# Patient Record
Sex: Female | Born: 1960
Health system: Southern US, Community
[De-identification: ages and names within clinical notes are randomized; demographics above are authoritative.]

## PROBLEM LIST (undated history)

## (undated) DIAGNOSIS — T7840XA Allergy, unspecified, initial encounter: Secondary | ICD-10-CM

## (undated) DIAGNOSIS — R569 Unspecified convulsions: Secondary | ICD-10-CM

## (undated) DIAGNOSIS — E079 Disorder of thyroid, unspecified: Secondary | ICD-10-CM

## (undated) DIAGNOSIS — I10 Essential (primary) hypertension: Secondary | ICD-10-CM

## (undated) DIAGNOSIS — D649 Anemia, unspecified: Secondary | ICD-10-CM

## (undated) DIAGNOSIS — K219 Gastro-esophageal reflux disease without esophagitis: Secondary | ICD-10-CM

## (undated) DIAGNOSIS — E785 Hyperlipidemia, unspecified: Secondary | ICD-10-CM

## (undated) DIAGNOSIS — K625 Hemorrhage of anus and rectum: Secondary | ICD-10-CM

## (undated) DIAGNOSIS — D219 Benign neoplasm of connective and other soft tissue, unspecified: Secondary | ICD-10-CM

## (undated) DIAGNOSIS — Z9289 Personal history of other medical treatment: Secondary | ICD-10-CM

## (undated) DIAGNOSIS — Z8742 Personal history of other diseases of the female genital tract: Secondary | ICD-10-CM

## (undated) HISTORY — DX: Benign neoplasm of connective and other soft tissue, unspecified: D21.9

## (undated) HISTORY — DX: Allergy, unspecified, initial encounter: T78.40XA

## (undated) HISTORY — DX: Hyperlipidemia, unspecified: E78.5

## (undated) HISTORY — DX: Gastro-esophageal reflux disease without esophagitis: K21.9

## (undated) HISTORY — DX: Disorder of thyroid, unspecified: E07.9

## (undated) HISTORY — PX: MYOMECTOMY: SHX85

## (undated) HISTORY — PX: ANKLE SURGERY: SHX546

## (undated) HISTORY — DX: Hemorrhage of anus and rectum: K62.5

## (undated) HISTORY — DX: Personal history of other diseases of the female genital tract: Z87.42

## (undated) HISTORY — DX: Personal history of other medical treatment: Z92.89

## (undated) HISTORY — DX: Anemia, unspecified: D64.9

## (undated) HISTORY — PX: EXCISION VAGINAL CYST: SHX5825

---

## 1991-10-10 HISTORY — PX: TUBAL LIGATION: SHX77

## 1997-10-07 ENCOUNTER — Encounter: Admission: RE | Admit: 1997-10-07 | Discharge: 1997-10-07 | Payer: Self-pay | Admitting: Internal Medicine

## 1998-04-11 ENCOUNTER — Emergency Department (HOSPITAL_COMMUNITY): Admission: EM | Admit: 1998-04-11 | Discharge: 1998-04-11 | Payer: Self-pay | Admitting: Emergency Medicine

## 1999-01-24 ENCOUNTER — Encounter: Admission: RE | Admit: 1999-01-24 | Discharge: 1999-01-24 | Payer: Self-pay | Admitting: Hematology and Oncology

## 1999-03-16 ENCOUNTER — Inpatient Hospital Stay (HOSPITAL_COMMUNITY): Admission: EM | Admit: 1999-03-16 | Discharge: 1999-03-17 | Payer: Self-pay | Admitting: Emergency Medicine

## 1999-03-16 ENCOUNTER — Encounter: Payer: Self-pay | Admitting: Emergency Medicine

## 1999-03-27 ENCOUNTER — Encounter: Admission: RE | Admit: 1999-03-27 | Discharge: 1999-03-27 | Payer: Self-pay | Admitting: Internal Medicine

## 1999-11-13 ENCOUNTER — Other Ambulatory Visit: Admission: RE | Admit: 1999-11-13 | Discharge: 1999-11-13 | Payer: Self-pay | Admitting: Gynecology

## 1999-11-21 ENCOUNTER — Encounter: Payer: Self-pay | Admitting: *Deleted

## 1999-11-21 ENCOUNTER — Encounter: Admission: RE | Admit: 1999-11-21 | Discharge: 1999-11-21 | Payer: Self-pay | Admitting: *Deleted

## 1999-12-20 ENCOUNTER — Encounter: Admission: RE | Admit: 1999-12-20 | Discharge: 1999-12-20 | Payer: Self-pay | Admitting: *Deleted

## 1999-12-20 ENCOUNTER — Encounter: Payer: Self-pay | Admitting: *Deleted

## 2000-01-30 ENCOUNTER — Ambulatory Visit (HOSPITAL_COMMUNITY): Admission: RE | Admit: 2000-01-30 | Discharge: 2000-01-30 | Payer: Self-pay | Admitting: Gynecology

## 2000-01-30 ENCOUNTER — Encounter (INDEPENDENT_AMBULATORY_CARE_PROVIDER_SITE_OTHER): Payer: Self-pay | Admitting: Specialist

## 2000-10-13 ENCOUNTER — Emergency Department (HOSPITAL_COMMUNITY): Admission: EM | Admit: 2000-10-13 | Discharge: 2000-10-13 | Payer: Self-pay | Admitting: *Deleted

## 2000-10-13 ENCOUNTER — Encounter: Payer: Self-pay | Admitting: Emergency Medicine

## 2000-10-20 ENCOUNTER — Emergency Department (HOSPITAL_COMMUNITY): Admission: EM | Admit: 2000-10-20 | Discharge: 2000-10-20 | Payer: Self-pay | Admitting: Internal Medicine

## 2000-12-30 ENCOUNTER — Other Ambulatory Visit: Admission: RE | Admit: 2000-12-30 | Discharge: 2000-12-30 | Payer: Self-pay | Admitting: Gynecology

## 2001-01-22 ENCOUNTER — Emergency Department (HOSPITAL_COMMUNITY): Admission: EM | Admit: 2001-01-22 | Discharge: 2001-01-22 | Payer: Self-pay

## 2001-03-31 ENCOUNTER — Emergency Department (HOSPITAL_COMMUNITY): Admission: EM | Admit: 2001-03-31 | Discharge: 2001-04-01 | Payer: Self-pay | Admitting: Emergency Medicine

## 2001-04-01 ENCOUNTER — Encounter: Payer: Self-pay | Admitting: Emergency Medicine

## 2001-09-10 ENCOUNTER — Emergency Department (HOSPITAL_COMMUNITY): Admission: EM | Admit: 2001-09-10 | Discharge: 2001-09-10 | Payer: Self-pay | Admitting: Emergency Medicine

## 2001-09-14 ENCOUNTER — Encounter: Payer: Self-pay | Admitting: Emergency Medicine

## 2001-09-14 ENCOUNTER — Emergency Department (HOSPITAL_COMMUNITY): Admission: EM | Admit: 2001-09-14 | Discharge: 2001-09-14 | Payer: Self-pay | Admitting: Emergency Medicine

## 2001-11-01 ENCOUNTER — Emergency Department (HOSPITAL_COMMUNITY): Admission: EM | Admit: 2001-11-01 | Discharge: 2001-11-01 | Payer: Self-pay

## 2001-11-01 ENCOUNTER — Encounter: Payer: Self-pay | Admitting: Emergency Medicine

## 2002-06-08 ENCOUNTER — Emergency Department (HOSPITAL_COMMUNITY): Admission: EM | Admit: 2002-06-08 | Discharge: 2002-06-09 | Payer: Self-pay | Admitting: Emergency Medicine

## 2002-08-06 ENCOUNTER — Encounter: Payer: Self-pay | Admitting: Internal Medicine

## 2002-08-06 ENCOUNTER — Inpatient Hospital Stay (HOSPITAL_COMMUNITY): Admission: AD | Admit: 2002-08-06 | Discharge: 2002-08-06 | Payer: Self-pay | Admitting: Gynecology

## 2002-08-06 ENCOUNTER — Encounter (INDEPENDENT_AMBULATORY_CARE_PROVIDER_SITE_OTHER): Payer: Self-pay

## 2002-08-12 ENCOUNTER — Other Ambulatory Visit: Admission: RE | Admit: 2002-08-12 | Discharge: 2002-08-12 | Payer: Self-pay | Admitting: Gynecology

## 2002-10-09 ENCOUNTER — Emergency Department (HOSPITAL_COMMUNITY): Admission: EM | Admit: 2002-10-09 | Discharge: 2002-10-09 | Payer: Self-pay | Admitting: Emergency Medicine

## 2002-10-09 ENCOUNTER — Encounter: Payer: Self-pay | Admitting: Emergency Medicine

## 2002-11-23 ENCOUNTER — Encounter: Payer: Self-pay | Admitting: Family Medicine

## 2002-11-23 ENCOUNTER — Encounter: Admission: RE | Admit: 2002-11-23 | Discharge: 2002-11-23 | Payer: Self-pay | Admitting: Family Medicine

## 2003-02-27 ENCOUNTER — Emergency Department (HOSPITAL_COMMUNITY): Admission: EM | Admit: 2003-02-27 | Discharge: 2003-02-28 | Payer: Self-pay | Admitting: Emergency Medicine

## 2003-02-28 ENCOUNTER — Encounter: Payer: Self-pay | Admitting: Emergency Medicine

## 2003-03-03 ENCOUNTER — Emergency Department (HOSPITAL_COMMUNITY): Admission: EM | Admit: 2003-03-03 | Discharge: 2003-03-03 | Payer: Self-pay | Admitting: Emergency Medicine

## 2003-06-16 ENCOUNTER — Emergency Department (HOSPITAL_COMMUNITY): Admission: EM | Admit: 2003-06-16 | Discharge: 2003-06-16 | Payer: Self-pay | Admitting: Emergency Medicine

## 2003-06-16 ENCOUNTER — Emergency Department (HOSPITAL_COMMUNITY): Admission: EM | Admit: 2003-06-16 | Discharge: 2003-06-17 | Payer: Self-pay | Admitting: Emergency Medicine

## 2004-08-30 ENCOUNTER — Other Ambulatory Visit: Admission: RE | Admit: 2004-08-30 | Discharge: 2004-08-30 | Payer: Self-pay | Admitting: Gynecology

## 2004-10-26 ENCOUNTER — Emergency Department (HOSPITAL_COMMUNITY): Admission: EM | Admit: 2004-10-26 | Discharge: 2004-10-26 | Payer: Self-pay | Admitting: Emergency Medicine

## 2004-11-18 ENCOUNTER — Inpatient Hospital Stay (HOSPITAL_COMMUNITY): Admission: AD | Admit: 2004-11-18 | Discharge: 2004-11-18 | Payer: Self-pay | Admitting: Gynecology

## 2005-01-18 ENCOUNTER — Ambulatory Visit (HOSPITAL_COMMUNITY): Admission: RE | Admit: 2005-01-18 | Discharge: 2005-01-18 | Payer: Self-pay | Admitting: Cardiology

## 2005-03-07 ENCOUNTER — Emergency Department (HOSPITAL_COMMUNITY): Admission: EM | Admit: 2005-03-07 | Discharge: 2005-03-07 | Payer: Self-pay | Admitting: Emergency Medicine

## 2005-06-11 ENCOUNTER — Inpatient Hospital Stay (HOSPITAL_COMMUNITY): Admission: AD | Admit: 2005-06-11 | Discharge: 2005-06-11 | Payer: Self-pay | Admitting: Family Medicine

## 2005-09-04 ENCOUNTER — Encounter: Payer: Self-pay | Admitting: Emergency Medicine

## 2005-10-10 ENCOUNTER — Emergency Department (HOSPITAL_COMMUNITY): Admission: EM | Admit: 2005-10-10 | Discharge: 2005-10-10 | Payer: Self-pay | Admitting: Emergency Medicine

## 2005-10-16 ENCOUNTER — Ambulatory Visit: Payer: Self-pay | Admitting: Internal Medicine

## 2005-10-16 ENCOUNTER — Encounter: Admission: RE | Admit: 2005-10-16 | Discharge: 2005-10-16 | Payer: Self-pay | Admitting: Internal Medicine

## 2005-11-10 ENCOUNTER — Encounter: Admission: RE | Admit: 2005-11-10 | Discharge: 2005-11-10 | Payer: Self-pay | Admitting: Family Medicine

## 2006-01-01 ENCOUNTER — Ambulatory Visit: Payer: Self-pay | Admitting: Internal Medicine

## 2006-05-06 ENCOUNTER — Encounter: Payer: Self-pay | Admitting: Internal Medicine

## 2006-05-06 LAB — CONVERTED CEMR LAB

## 2006-09-19 ENCOUNTER — Ambulatory Visit: Payer: Self-pay | Admitting: Internal Medicine

## 2006-09-19 LAB — CONVERTED CEMR LAB
ALT: 12 units/L (ref 0–40)
AST: 15 units/L (ref 0–37)
Albumin: 3.9 g/dL (ref 3.5–5.2)
Alkaline Phosphatase: 79 units/L (ref 39–117)
BUN: 9 mg/dL (ref 6–23)
Basophils Absolute: 0 10*3/uL (ref 0.0–0.1)
Basophils Relative: 0.8 % (ref 0.0–1.0)
Bilirubin, Direct: 0.1 mg/dL (ref 0.0–0.3)
CO2: 25 meq/L (ref 19–32)
Calcium: 9.1 mg/dL (ref 8.4–10.5)
Chloride: 105 meq/L (ref 96–112)
Cholesterol: 194 mg/dL (ref 0–200)
Creatinine, Ser: 0.8 mg/dL (ref 0.4–1.2)
Eosinophils Absolute: 0.1 10*3/uL (ref 0.0–0.6)
Eosinophils Relative: 3.1 % (ref 0.0–5.0)
GFR calc Af Amer: 100 mL/min
GFR calc non Af Amer: 83 mL/min
Glucose, Bld: 93 mg/dL (ref 70–99)
HCT: 25.6 % — ABNORMAL LOW (ref 36.0–46.0)
HDL: 42.3 mg/dL (ref >39.0)
Hemoglobin: 8 g/dL — ABNORMAL LOW (ref 12.0–15.0)
LDL Cholesterol: 132 mg/dL — ABNORMAL HIGH (ref 0–99)
Lymphocytes Relative: 34.4 % (ref 12.0–46.0)
MCHC: 31.3 g/dL (ref 30.0–36.0)
MCV: 60.9 fL — ABNORMAL LOW (ref 78.0–100.0)
Monocytes Absolute: 0.4 10*3/uL (ref 0.2–0.7)
Monocytes Relative: 8 % (ref 3.0–11.0)
Neutro Abs: 2.6 10*3/uL (ref 1.4–7.7)
Neutrophils Relative %: 53.7 % (ref 43.0–77.0)
Platelets: 331 10*3/uL (ref 150–400)
Potassium: 4.1 meq/L (ref 3.5–5.1)
RBC: 4.2 M/uL (ref 3.87–5.11)
RDW: 17.8 % — ABNORMAL HIGH (ref 11.5–14.6)
Sodium: 137 meq/L (ref 135–145)
TSH: <0.01 microintl units/mL — ABNORMAL LOW (ref 0.35–5.50)
Total Bilirubin: 0.6 mg/dL (ref 0.3–1.2)
Total CHOL/HDL Ratio: 4.6
Total Protein: 7.7 g/dL (ref 6.0–8.3)
Triglycerides: 97 mg/dL (ref 0–149)
VLDL: 19 mg/dL (ref 0–40)
WBC: 4.8 10*3/uL (ref 4.5–10.5)

## 2006-09-24 ENCOUNTER — Ambulatory Visit: Payer: Self-pay | Admitting: Internal Medicine

## 2006-09-24 LAB — CONVERTED CEMR LAB
Basophils Absolute: 0 10*3/uL (ref 0.0–0.1)
Basophils Relative: 0.8 % (ref 0.0–1.0)
Eosinophils Absolute: 0.2 10*3/uL (ref 0.0–0.6)
Eosinophils Relative: 3.7 % (ref 0.0–5.0)
Ferritin: 2.2 ng/mL — ABNORMAL LOW (ref 10.0–291.0)
Folate: 8 ng/mL
Free T4: 0.8 ng/dL (ref 0.6–1.6)
HCT: 26.1 % — ABNORMAL LOW (ref 36.0–46.0)
Hemoglobin: 8.1 g/dL — ABNORMAL LOW (ref 12.0–15.0)
Iron: 14 ug/dL — ABNORMAL LOW (ref 42–145)
Lymphocytes Relative: 31.8 % (ref 12.0–46.0)
MCHC: 30.9 g/dL (ref 30.0–36.0)
MCV: 60.8 fL — ABNORMAL LOW (ref 78.0–100.0)
Monocytes Absolute: 0.4 10*3/uL (ref 0.2–0.7)
Monocytes Relative: 8.2 % (ref 3.0–11.0)
Neutro Abs: 2.8 10*3/uL (ref 1.4–7.7)
Neutrophils Relative %: 55.5 % (ref 43.0–77.0)
Platelets: 375 10*3/uL (ref 150–400)
RBC: 4.3 M/uL (ref 3.87–5.11)
RDW: 16.9 % — ABNORMAL HIGH (ref 11.5–14.6)
Saturation Ratios: 2.4 % — ABNORMAL LOW (ref 20.0–50.0)
T3, Free: 3.2 pg/mL (ref 2.3–4.2)
TSH: 0 microintl units/mL — ABNORMAL LOW (ref 0.35–5.50)
Transferrin: 423.4 mg/dL — ABNORMAL HIGH (ref >=212.0)
Vitamin B-12: 192 pg/mL — ABNORMAL LOW (ref 211–911)
WBC: 5 10*3/uL (ref 4.5–10.5)

## 2006-10-17 ENCOUNTER — Ambulatory Visit: Payer: Self-pay | Admitting: Internal Medicine

## 2006-10-17 LAB — CONVERTED CEMR LAB
Free T4: 0.6 ng/dL (ref 0.6–1.6)
T3, Free: 3.1 pg/mL (ref 2.3–4.2)
TSH: 0.02 microintl units/mL — ABNORMAL LOW (ref 0.35–5.50)

## 2006-10-20 ENCOUNTER — Encounter: Admission: RE | Admit: 2006-10-20 | Discharge: 2006-10-20 | Payer: Self-pay | Admitting: Internal Medicine

## 2006-10-21 ENCOUNTER — Encounter: Admission: RE | Admit: 2006-10-21 | Discharge: 2006-10-21 | Payer: Self-pay | Admitting: Internal Medicine

## 2006-10-24 ENCOUNTER — Ambulatory Visit: Payer: Self-pay | Admitting: Internal Medicine

## 2006-12-04 ENCOUNTER — Encounter: Payer: Self-pay | Admitting: Internal Medicine

## 2006-12-12 ENCOUNTER — Telehealth: Payer: Self-pay | Admitting: Internal Medicine

## 2006-12-30 ENCOUNTER — Emergency Department (HOSPITAL_COMMUNITY): Admission: EM | Admit: 2006-12-30 | Discharge: 2006-12-30 | Payer: Self-pay | Admitting: Family Medicine

## 2006-12-31 ENCOUNTER — Ambulatory Visit: Payer: Self-pay | Admitting: Internal Medicine

## 2006-12-31 ENCOUNTER — Inpatient Hospital Stay (HOSPITAL_COMMUNITY): Admission: AD | Admit: 2006-12-31 | Discharge: 2007-01-04 | Payer: Self-pay | Admitting: Internal Medicine

## 2007-01-06 ENCOUNTER — Ambulatory Visit: Payer: Self-pay | Admitting: Internal Medicine

## 2007-01-06 DIAGNOSIS — D509 Iron deficiency anemia, unspecified: Secondary | ICD-10-CM | POA: Insufficient documentation

## 2007-01-06 DIAGNOSIS — L500 Allergic urticaria: Secondary | ICD-10-CM | POA: Insufficient documentation

## 2007-01-07 ENCOUNTER — Telehealth: Payer: Self-pay | Admitting: Internal Medicine

## 2007-01-07 LAB — CONVERTED CEMR LAB
BUN: 24 mg/dL — ABNORMAL HIGH (ref 6–23)
Basophils Absolute: 0 10*3/uL (ref 0.0–0.1)
Basophils Relative: 0.1 % (ref 0.0–1.0)
CO2: 25 meq/L (ref 19–32)
Calcium: 8.5 mg/dL (ref 8.4–10.5)
Chloride: 104 meq/L (ref 96–112)
Creatinine, Ser: 1 mg/dL (ref 0.4–1.2)
Eosinophils Absolute: 0 10*3/uL (ref 0.0–0.6)
Eosinophils Relative: 0.1 % (ref 0.0–5.0)
Free T4: 0.5 ng/dL — ABNORMAL LOW (ref 0.6–1.6)
GFR calc Af Amer: 77 mL/min
GFR calc non Af Amer: 64 mL/min
Glucose, Bld: 136 mg/dL — ABNORMAL HIGH (ref 70–99)
HCT: 29.8 % — ABNORMAL LOW (ref 36.0–46.0)
Hemoglobin: 9.5 g/dL — ABNORMAL LOW (ref 12.0–15.0)
Lymphocytes Relative: 7.9 % — ABNORMAL LOW (ref 12.0–46.0)
MCHC: 31.8 g/dL (ref 30.0–36.0)
MCV: 66.8 fL — ABNORMAL LOW (ref 78.0–100.0)
Monocytes Absolute: 0.9 10*3/uL — ABNORMAL HIGH (ref 0.2–0.7)
Monocytes Relative: 6.2 % (ref 3.0–11.0)
Neutro Abs: 15.3 10*3/uL — ABNORMAL HIGH (ref 1.4–7.7)
Platelets: 304 10*3/uL (ref 150–400)
Potassium: 4 meq/L (ref 3.5–5.1)
RBC: 4.42 M/uL (ref 3.87–5.11)
RDW: 18.3 % — ABNORMAL HIGH (ref 11.5–14.6)
Sodium: 137 meq/L (ref 135–145)
TSH: 0.16 microintl units/mL — ABNORMAL LOW (ref 0.35–5.50)
WBC: 16.9 10*3/uL — ABNORMAL HIGH (ref 4.5–10.5)

## 2007-01-08 ENCOUNTER — Ambulatory Visit: Payer: Self-pay | Admitting: Endocrinology

## 2007-01-11 ENCOUNTER — Encounter: Payer: Self-pay | Admitting: Endocrinology

## 2007-01-21 ENCOUNTER — Telehealth: Payer: Self-pay | Admitting: *Deleted

## 2007-06-05 ENCOUNTER — Telehealth: Payer: Self-pay | Admitting: Internal Medicine

## 2007-06-10 ENCOUNTER — Ambulatory Visit: Payer: Self-pay | Admitting: Internal Medicine

## 2007-06-11 LAB — CONVERTED CEMR LAB
Eosinophils Absolute: 0.2 10*3/uL (ref 0.0–0.6)
Ferritin: 2 ng/mL — ABNORMAL LOW (ref 10.0–291.0)
Lymphocytes Relative: 30.1 % (ref 12.0–46.0)
MCHC: 29.6 g/dL — ABNORMAL LOW (ref 30.0–36.0)
MCV: 60.9 fL — ABNORMAL LOW (ref 78.0–100.0)
Monocytes Relative: 10.6 % (ref 3.0–11.0)
Neutro Abs: 4 10*3/uL (ref 1.4–7.7)
Platelets: 322 10*3/uL (ref 150–400)
TSH: 1.01 microintl units/mL (ref 0.35–5.50)

## 2007-06-12 ENCOUNTER — Telehealth: Payer: Self-pay | Admitting: Internal Medicine

## 2007-06-24 ENCOUNTER — Ambulatory Visit: Payer: Self-pay | Admitting: Internal Medicine

## 2007-06-24 DIAGNOSIS — R002 Palpitations: Secondary | ICD-10-CM | POA: Insufficient documentation

## 2007-06-30 ENCOUNTER — Telehealth: Payer: Self-pay | Admitting: Internal Medicine

## 2007-09-07 ENCOUNTER — Ambulatory Visit: Payer: Self-pay | Admitting: Internal Medicine

## 2007-09-07 ENCOUNTER — Telehealth: Payer: Self-pay | Admitting: Internal Medicine

## 2007-09-11 LAB — CONVERTED CEMR LAB
Basophils Absolute: 0.1 10*3/uL (ref 0.0–0.1)
GFR calc Af Amer: 100 mL/min
Glucose, Bld: 88 mg/dL (ref 70–99)
HCT: 25.6 % — ABNORMAL LOW (ref 36.0–46.0)
Lymphocytes Relative: 27.8 % (ref 12.0–46.0)
Monocytes Absolute: 0.6 10*3/uL (ref 0.1–1.0)
Monocytes Relative: 10.8 % (ref 3.0–12.0)
Platelets: 306 10*3/uL (ref 150–400)
Potassium: 3.7 meq/L (ref 3.5–5.1)
RDW: 17.8 % — ABNORMAL HIGH (ref 11.5–14.6)
Sodium: 138 meq/L (ref 135–145)
TSH: 1.53 microintl units/mL (ref 0.35–5.50)

## 2007-09-17 ENCOUNTER — Telehealth: Payer: Self-pay | Admitting: Internal Medicine

## 2007-10-07 ENCOUNTER — Ambulatory Visit: Payer: Self-pay | Admitting: Internal Medicine

## 2008-06-01 ENCOUNTER — Emergency Department (HOSPITAL_COMMUNITY): Admission: EM | Admit: 2008-06-01 | Discharge: 2008-06-02 | Payer: Self-pay | Admitting: Emergency Medicine

## 2009-05-20 ENCOUNTER — Emergency Department (HOSPITAL_COMMUNITY): Admission: EM | Admit: 2009-05-20 | Discharge: 2009-05-20 | Payer: Self-pay | Admitting: Emergency Medicine

## 2009-07-05 ENCOUNTER — Observation Stay (HOSPITAL_COMMUNITY): Admission: EM | Admit: 2009-07-05 | Discharge: 2009-07-08 | Payer: Self-pay | Admitting: Emergency Medicine

## 2009-12-19 ENCOUNTER — Encounter: Admission: RE | Admit: 2009-12-19 | Discharge: 2009-12-19 | Payer: Self-pay | Admitting: Diagnostic Neuroimaging

## 2009-12-31 ENCOUNTER — Emergency Department (HOSPITAL_COMMUNITY): Admission: EM | Admit: 2009-12-31 | Discharge: 2009-12-31 | Payer: Self-pay | Admitting: Emergency Medicine

## 2010-05-27 ENCOUNTER — Encounter: Payer: Self-pay | Admitting: Family Medicine

## 2010-05-28 ENCOUNTER — Encounter: Payer: Self-pay | Admitting: Endocrinology

## 2010-07-27 LAB — CBC
HCT: 21 % — ABNORMAL LOW (ref 36.0–46.0)
HCT: 28.6 % — ABNORMAL LOW (ref 36.0–46.0)
Hemoglobin: 8.9 g/dL — ABNORMAL LOW (ref 12.0–15.0)
Hemoglobin: 9.2 g/dL — ABNORMAL LOW (ref 12.0–15.0)
Hemoglobin: 9.4 g/dL — ABNORMAL LOW (ref 12.0–15.0)
MCHC: 29.7 g/dL — ABNORMAL LOW (ref 30.0–36.0)
MCHC: 32 g/dL (ref 30.0–36.0)
MCHC: 32 g/dL (ref 30.0–36.0)
MCV: 63.1 fL — ABNORMAL LOW (ref 78.0–100.0)
MCV: 69.5 fL — ABNORMAL LOW (ref 78.0–100.0)
Platelets: 156 10*3/uL (ref 150–400)
Platelets: 192 10*3/uL (ref 150–400)
Platelets: 200 10*3/uL (ref 150–400)
RBC: 3.99 MIL/uL (ref 3.87–5.11)
RDW: 28 % — ABNORMAL HIGH (ref 11.5–15.5)
RDW: 28.8 % — ABNORMAL HIGH (ref 11.5–15.5)
RDW: 29.4 % — ABNORMAL HIGH (ref 11.5–15.5)
WBC: 6.9 10*3/uL (ref 4.0–10.5)

## 2010-07-27 LAB — DIFFERENTIAL
Basophils Absolute: 0 10*3/uL (ref 0.0–0.1)
Basophils Absolute: 0 10*3/uL (ref 0.0–0.1)
Basophils Relative: 0 % (ref 0–1)
Basophils Relative: 1 % (ref 0–1)
Eosinophils Absolute: 0.1 10*3/uL (ref 0.0–0.7)
Eosinophils Absolute: 0.1 10*3/uL (ref 0.0–0.7)
Lymphocytes Relative: 22 % (ref 12–46)
Monocytes Relative: 10 % (ref 3–12)
Neutrophils Relative %: 65 % (ref 43–77)
Neutrophils Relative %: 69 % (ref 43–77)

## 2010-07-27 LAB — CARDIAC PANEL(CRET KIN+CKTOT+MB+TROPI)
Relative Index: INVALID (ref 0.0–2.5)
Total CK: 67 U/L (ref 7–177)
Troponin I: 0.01 ng/mL (ref 0.00–0.06)

## 2010-07-27 LAB — COMPREHENSIVE METABOLIC PANEL
AST: 20 U/L (ref 0–37)
Albumin: 3.9 g/dL (ref 3.5–5.2)
BUN: 8 mg/dL (ref 6–23)
Calcium: 8.6 mg/dL (ref 8.4–10.5)
Chloride: 105 mEq/L (ref 96–112)
Creatinine, Ser: 0.73 mg/dL (ref 0.4–1.2)
GFR calc Af Amer: >60 mL/min (ref >60)
GFR calc non Af Amer: >60 mL/min (ref >60)
Total Bilirubin: 0.6 mg/dL (ref 0.3–1.2)

## 2010-07-27 LAB — IRON AND TIBC
Iron: 11 ug/dL — ABNORMAL LOW (ref 42–135)
UIBC: 517 ug/dL

## 2010-07-27 LAB — BASIC METABOLIC PANEL
CO2: 27 mEq/L (ref 19–32)
Calcium: 8.3 mg/dL — ABNORMAL LOW (ref 8.4–10.5)
Calcium: 8.8 mg/dL (ref 8.4–10.5)
GFR calc Af Amer: >60 mL/min (ref >60)
GFR calc Af Amer: >60 mL/min (ref >60)
GFR calc non Af Amer: >60 mL/min (ref >60)
GFR calc non Af Amer: >60 mL/min (ref >60)
Glucose, Bld: 124 mg/dL — ABNORMAL HIGH (ref 70–99)
Glucose, Bld: 98 mg/dL (ref 70–99)
Potassium: 4.2 mEq/L (ref 3.5–5.1)
Sodium: 134 mEq/L — ABNORMAL LOW (ref 135–145)
Sodium: 136 mEq/L (ref 135–145)
Sodium: 140 mEq/L (ref 135–145)

## 2010-07-27 LAB — URINALYSIS, ROUTINE W REFLEX MICROSCOPIC
Ketones, ur: NEGATIVE mg/dL
Nitrite: NEGATIVE
Protein, ur: NEGATIVE mg/dL
Urobilinogen, UA: 0.2 mg/dL (ref 0.0–1.0)
pH: 7 (ref 5.0–8.0)

## 2010-07-27 LAB — POCT I-STAT, CHEM 8
BUN: 10 mg/dL (ref 6–23)
Calcium, Ion: 1.14 mmol/L (ref 1.12–1.32)
Chloride: 107 mEq/L (ref 96–112)
Creatinine, Ser: 0.7 mg/dL (ref 0.4–1.2)
Glucose, Bld: 140 mg/dL — ABNORMAL HIGH (ref 70–99)
TCO2: 22 mmol/L (ref 0–100)

## 2010-07-27 LAB — CROSSMATCH

## 2010-07-27 LAB — APTT: aPTT: 27 seconds (ref 24–37)

## 2010-07-27 LAB — RETICULOCYTES
RBC.: 3.72 MIL/uL — ABNORMAL LOW (ref 3.87–5.11)
Retic Count, Absolute: 55.8 10*3/uL (ref 19.0–186.0)

## 2010-07-27 LAB — PROTIME-INR: INR: 1.18 (ref 0.00–1.49)

## 2010-07-27 LAB — FERRITIN: Ferritin: 3 ng/mL — ABNORMAL LOW (ref 10–291)

## 2010-07-27 LAB — LACTIC ACID, PLASMA: Lactic Acid, Venous: 3.8 mmol/L — ABNORMAL HIGH (ref 0.5–2.2)

## 2010-07-27 LAB — TSH: TSH: 0.777 u[IU]/mL (ref 0.350–4.500)

## 2010-08-20 LAB — DIFFERENTIAL
Basophils Absolute: 0.1 10*3/uL (ref 0.0–0.1)
Eosinophils Absolute: 0.2 10*3/uL (ref 0.0–0.7)
Lymphocytes Relative: 33 % (ref 12–46)
Monocytes Absolute: 0.6 10*3/uL (ref 0.1–1.0)
Neutrophils Relative %: 55 % (ref 43–77)

## 2010-08-20 LAB — CBC
HCT: 21.2 % — ABNORMAL LOW (ref 36.0–46.0)
Hemoglobin: 6.4 g/dL — CL (ref 12.0–15.0)
MCV: 60.9 fL — ABNORMAL LOW (ref 78.0–100.0)
Platelets: 198 10*3/uL (ref 150–400)
Platelets: 201 10*3/uL (ref 150–400)
RBC: 3.45 MIL/uL — ABNORMAL LOW (ref 3.87–5.11)
WBC: 7.1 10*3/uL (ref 4.0–10.5)
WBC: 9 10*3/uL (ref 4.0–10.5)

## 2010-08-20 LAB — COMPREHENSIVE METABOLIC PANEL
Albumin: 3.8 g/dL (ref 3.5–5.2)
BUN: 7 mg/dL (ref 6–23)
CO2: 24 mEq/L (ref 19–32)
Chloride: 104 mEq/L (ref 96–112)
Creatinine, Ser: 0.76 mg/dL (ref 0.4–1.2)
GFR calc non Af Amer: >60 mL/min (ref >60)
Glucose, Bld: 90 mg/dL (ref 70–99)
Total Bilirubin: 0.5 mg/dL (ref 0.3–1.2)

## 2010-08-20 LAB — URINALYSIS, ROUTINE W REFLEX MICROSCOPIC
Glucose, UA: NEGATIVE mg/dL
Hgb urine dipstick: NEGATIVE
Ketones, ur: NEGATIVE mg/dL
Protein, ur: NEGATIVE mg/dL

## 2010-09-18 NOTE — H&P (Signed)
April Gonzalez, April Gonzalez                ACCOUNT NO.:  [id number]   MEDICAL RECORD NO.:  [id number]          PATIENT TYPE:  INP   LOCATION:  2115                         FACILITY:  [facility]   PHYSICIAN:  Bruce H. Swords, MD    DATE OF BIRTH:  [birth year]   DATE OF ADMISSION:  12/31/2006  DATE OF DISCHARGE:                              HISTORY & PHYSICAL   CHIEF COMPLAINT:  Rash.   HISTORY OF PRESENT ILLNESS:  April Gonzalez is a 50 year old female who was  diagnosed with hyperthyroidism in June, 2008.  She was started on  Tapazole at that time.  She tolerated the medicine well until  approximately the beginning of August.  At that time she developed a  rash.  She stopped her medicine.  The rash got better.  She restarted  the medicine on August 8th.  Recently she has developed a rash again.  The rash is described as pruritic, raised.  It is transitory and  migratory.  It can be quite pruritic.  She went to the urgent care  center yesterday.  She was treated with Depo-Medrol and Kenalog.  She  was given a prescription for hydroxyzine which she has not filled yet.  She has discontinued the Tapazole.   The patient was in the process of being discharged from the office when  she developed a sensation of an itchy, swollen tongue.  At that time the  rash worsened.  She was brought back to an exam room and was treated  with epinephrine 0.5 mg and Benadryl 50 mg.  At that time I decided to  admit the patient for further care.   PAST MEDICAL HISTORY:  Hyperthyroidism.   MEDICATIONS:  Currently none.   SOCIAL HISTORY:  She is married.  She is a nonsmoker.   FAMILY HISTORY:  Noncontributory.   REVIEW OF SYSTEMS:  She denies any chest pain, shortness of breath, PND,  orthopnea.   PHYSICAL EXAMINATION:  VITAL SIGNS:  Temperature 98.2, blood pressure  130/70, respirations 16, heart rate 84.  GENERAL:  A well developed, well nourished female, uncomfortable because  she is scratching her self, though in  no distress.  HEENT:  Normocephalic/atraumatic.  Extraocular movements are intact.  Oropharynx moist.  The tongue does not appear swollen, but she is  complaining of it being swollen.  NECK:  Supple without lymphadenopathy, thyromegaly, jugular venous  distention or carotid bruits.  CHEST:  Clear to auscultation without increased work of breathing.  CARDIAC EXAM:  S1 and S2 are normal without murmurs or gallops.  ABDOMEN:  Active bowel sounds.  Soft, nontender.  There is no  hepatosplenomegaly.  EXTREMITIES:  There is no clubbing, cyanosis or edema.  DERMATOLOGIC EXAM:  Multiple hives on her trunk, extremities and even on  her cheeks.   LABORATORY DATA:  None.   ASSESSMENT/PLAN:  Allergic reaction to Tapazole with anaphylaxis.  The  patient needs hospitalization.  Given her transitory symptoms I think  she should be monitored in the intensive care unit.  Will treat with  Solu-Medrol, Benadryl p.r.n. We will address her thyroid status as  an  outpatient.  I am hopeful that this will be a short hospitalization.      Bruce Rexene Edison Swords, MD  Electronically Signed     BHS/MEDQ  D:  01/01/2007  T:  01/01/2007  Job:  [id number]

## 2010-09-18 NOTE — Discharge Summary (Signed)
April Gonzalez, April Gonzalez                ACCOUNT NO.:  [id number]   MEDICAL RECORD NO.:  [id number]          PATIENT TYPE:  INP   LOCATION:  5715                         FACILITY:  [facility]   PHYSICIAN:  Rosalyn Gess. Norins, MD  DATE OF BIRTH:  02-08-1961   DATE OF ADMISSION:  12/31/2006  DATE OF DISCHARGE:  01/04/2007                               DISCHARGE SUMMARY   ADMITTING DIAGNOSIS:  Allergic reaction, with angioedema and urticaria.   DISCHARGE DIAGNOSES:  1. Allergic reaction, with angioedema and urticaria, resolved.  2. Iron deficiency anemia.  3. Pleuritic chest pain.   CONSULTANTS:  None.   PROCEDURES:  1. Portable chest x-ray performed December 31, 2006 that was negative      for any acute findings, with no pleural fluid, no infiltrates.  2. Follow-up chest x-ray, January 01, 2007, with new left lower lung      subsegmental atelectasis.   HISTORY OF THE PRESENT ILLNESS:  April Gonzalez is a 50 year old woman  diagnosed with hyperthyroid disease, June 2008 and was started on  Tapazole.  She tolerated this well until the beginning of August 2008,  when she was started to develop a rash.  She stopped medication, and the  rash improved.  She restarted Tapazole on December 12, 2006 and then again  developed a rash which she described as a raised and pruritic,  transitory, and migratory.  She was seen in Urgent Care the day prior to  admission and treated with Depo-Medrol and Kenalog.  She was given a  prescription for hydroxyzine.  On the day of admission, the patient presented to the office when she  developed an itchy and swollen tongue and a worsening rash.  She was  treated in the office with epinephrine 0.5 mg subcutaneously and  Benadryl 50 mg and referred for hospitalization.   Please see the H&P for admission data and physical exam.   HOSPITAL COURSE:  1. Allergic reaction.  The patient was admitted to the hospital.  She      was started on high-dose IV Solu-Medrol at 120 mg q.6  h., as well      as antihistamines.  On this regimen, the patient rapidly improved,      with resolution of her of urticaria and angioedema.  On the third      hospital day, the patient was doing well.  At that point, her Solu-      Medrol was decreased to 80 mg t.i.d.  She had a transient recurrent      rash which resolved quickly.  At this point, she is stable and      ready for discharge home on a prednisone taper, starting at 60 mg      b.i.d. x 2, 40 mg b.i.d. x3, 60 daily x3, 40 daily x3, 20 daily x3,      10 daily x6.  She will need to have GI protection while on this      taper with omeprazole 40 mg q.a.m. and ranitidine 150 mg at      bedtime.  She is carefully instructed that  if she has any recurrent      angioedema or rash that she is to go back to the previous dose of      prednisone and to call her physician immediately.  2. Anemia.  The patient did develop a significant anemia while in the      hospital, with a hemoglobin that dropped to 7.8 g.  The patient was      offered transfusion but decline.  IV iron was considered but      thought to be risky, given the high incidence of allergic reaction,      and she was started on oral iron replacement.  She will need to      continue this regimen and have follow-up CBC with her primary care      physician after discharge.  3. Pulmonary.  Question of pneumonia at time of admission.  The      patient did have an elevated white blood count thought to be      secondary to high-dose steroids.  She had a negative chest x-ray,      with no signs of infiltrate or air space disease.  She had no      cough.  The patient initially had been treated with Avelox, but      with no obvious sign of pneumonia this was discontinued.  The      patient has done well.  She has remained afebrile, without cough,      but she does have mild pleuritic pain.  4. GI.  The patient did have significant abdominal tenderness.      Suspect this may be steroid  related.  She had no change in her      bowel habit, reporting that she has had bowel movement with normal-      colored stool.  The plan is to send her home on PPI and H2 blocker      as noted.   DISCHARGE PHYSICAL EXAMINATION:  VITAL SIGNS:  Temperature 97.8, blood  pressure 131/64, heart rate 54, respirations 20, O2 saturations 95% on  room air.  GENERAL APPEARANCE:  This is a well-nourished African American woman in  bed in no acute distress.  CHEST:  The patient is moving air well, with no rales, no wheezes, no  rhonchi.  No increased work of breathing.  CARDIOVASCULAR:  2+ radial pulse.  Her precordium was quiet.  She had a  regular rate and rhythm.  ABDOMEN:  Soft.  She had positive bowel sounds in all four quadrants.  The patient had significant tenderness to palpation, worse in the  epigastrium, but diffusely tender throughout.  RECTAL:  Not performed.  EXTREMITIES:  Without clubbing or cyanosis.  No edema is noted.  DERMATOLOGIC:  The patient has no rash, specifically no urticaria.   FINAL LABORATORY DATA:  CBC from January 03, 2007 with a hemoglobin of  8.1 g, white count was 18,900, platelet count 203,000, MCV was low at  67.2, platelet count was normal at 203,000 as noted.  Final chemistries  from December 31, 2006 were normal, with a creatinine 0.8, potassium 4.  Liver functions from December 31, 2006 were normal.  The patient did have  cardiac enzymes that were negative x3.  Troponin I was 0.03 and 0.02.  Urinalysis at admission was negative.  Serum ferritin was pending at  time of discharge dictation.  Serum iron was low at 14.  Actually,  ferritin from January 01, 2007 was  low at 3.   DISCHARGE MEDICATIONS:  1. Ferrous sulfate 325 mg t.i.d.  2. Claritin 10 mg daily.  3. Famotidine 20 mg b.i.d.  4. Prednisone 60 mg b.i.d. x2 days, 40 mg b.i.d. x3 days, 60 mg daily      x3 days, 40 mg daily x3 days, 20 mg daily x3 days, 10 mg daily x6      days, then off.   The patient  is discharged to home.  She is to call and schedule an  office follow-up visit with Dr. Cato Mulligan on Friday January 09, 2007 or  the following Monday or Tuesday.  She is to call sooner if she has any  recurrent swelling or rash.   CONDITION AT TIME OF DISCHARGE DICTATION:  Stable and improved.     Rosalyn Gess Norins, MD  Electronically Signed    MEN/MEDQ  D:  01/04/2007  T:  01/05/2007  Job:  [id number]   cc:   Valetta Mole. Swords, MD

## 2010-09-21 NOTE — H&P (Signed)
[facility]  Patient:    April Gonzalez, April Gonzalez                         MRN: [id number] Adm. Date:  01/30/00 Attending:  Marcial Pacas P. Fontaine, M.D.                         History and Physical  CHIEF COMPLAINT:              Increasing menorrhagia, dysmenorrhea, dyspareunia, abdominopelvic pain.  HISTORY OF PRESENT ILLNESS:   A 50 year old, G3, P2, AB1 female status post tubal sterilization who presents with a multiple month history of increasing menorrhagia and dysmenorrhea, deep dyspareunia and abdominopelvic discomfort described as more periumbilical with some radiation to the lower abdomen. She has had some nausea associated with this and she has undergone a fairly extensive workup by Dr. Eliezer Champagne to include abdominopelvic CT. Our evaluation included sonohysterogram which showed multiple myomas as well as a clear endometrial cavity defect consistent with a polyp. The patient is admitted at this time for laser video laparoscopy due to her deep dyspareunia which is interfering with her relationship as well as her abdominal pain to rule out intra-abdominal pathology such as adhesions and/or endometriosis. She is admitted for hysteroscopic resection of her endometrial defect which most likely is contributing along with her leiomyomata to her menorrhagia, dysmenorrhea. The patient also is noted to be anemic from her periods with a hemoglobin in the 8.2 range for which she is on iron supplementation now.  PAST MEDICAL HISTORY:         The patient has a history of being told she has rheumatoid arthritis with occasional achy and swollen joints for which she takes nonsteroidal anti-inflammatory medications on a p.r.n. basis.  PAST SURGICAL HISTORY:        Post partum tubal sterilization.  ALLERGIES:                    None.  CURRENT MEDICATIONS:          Iron.  REVIEW OF SYSTEMS:            Noncontributory.  FAMILY HISTORY:                Noncontributory.  ADMISSION PHYSICAL EXAMINATION:  HEENT:                        Normal.  LUNGS:                        Clear.  CARDIAC:                      Regular rate without murmurs, rubs or gallops.  ABDOMEN:                      Benign.  PELVIC:                       External, BUS, vagina normal. Cervix normal. Uterus slightly enlarged and irregular consistent with history of leiomyomata, adnexa without gross masses or tenderness.  ASSESSMENT:                   A 50 year old, G3, P2, AB1 female status post tubal sterilization with leiomyomata endometrial defect on sonohysterogram, menorrhagia, dysmenorrhea, iron deficiency anemia, ill defined abdominopelvic pain, deep  dyspareunia for laser video laparoscopy, hysteroscopic evaluation and resection of endometrial defect. The risks, benefits, indications and alternatives for the procedure were reviewed with the patient and her husband in detail to include the expected intraoperative and postoperative courses. The patient and her husband understand that there will be 2 procedures, first the hysteroscopic resection and then following laparoscopic evaluation. I reviewed with them whats involved with both procedures to include instrumentation of hysteroscope, resectoscope, laparoscope, trocar placement, insufflation, sharp and blunt dissection, electrocautery, and the use of laser. The patient understands that we will try to resect endometrial defects but that we will not remove all of her leiomyomata but only the abnormal findings within the cavity itself and she will still have leiomyomata following the procedure. There are no guarantees as far as pain relief or bleeding relief following the procedure, but hopefully resection of the endometrial defects will improve her bleeding status but certainly she has other fibroids that may be significantly contributing to her bleeding and that this may continue following the procedure. We  also discussed no guarantees were made as far as her abdominopelvic pain and her deep dyspareunia that her pain may continue following the procedure may worsen or change following the procedure and both her and her husband understand and accept this. The risks of both procedures were discussed with them to include the risk of uterine perforation leading to damage to internal organs from the hysteroscope as well as the risks of distended absorption. The risk both from the laparoscope and hysteroscope for internal organ damage including bowel, bladder, ureters, vessels and nerves necessitating major exploratory repairative surgeries and future repairative surgeries including ostomy formation was discussed, understood and accepted. The risks of major vessel injury leading to hemorrhage necessitating transfusion and the risk of transfusion including transfusion reaction, hepatitis, and AIDS was all discussed, understood and accepted. The risks for infection both internal as well as incisional requiring opening and draining of incisions or prolonged antibiotics was also discussed, understood and accepted. Again the patient and her husband understand that there are no guarantees for improvement of her condition both from a pain or bleeding standpoint and that this may continue, worsen or change following the procedure and they understand and accept this. The patients questions and her husbands questions were answered to their satisfaction and she is ready to proceed with surgery. DD:  01/28/00 TD:  01/28/00 Job: [id number]

## 2010-09-21 NOTE — Op Note (Signed)
[facility]  Patient:    April Gonzalez, April Gonzalez                       MRN: [id number] Proc. Date: 01/30/00 Adm. Date:  [id number] Attending:  [name]                           Operative Report  PREOPERATIVE DIAGNOSIS:  Increasing menorrhagia, dysmenorrhea, dyspareunia, abdominopelvic pain.  POSTOPERATIVE DIAGNOSIS:  Endometrial polyp, endometriosis, leiomyomata, pelvic adhesions.  OPERATION:  Hysteroscopic endometrial polypectomy, dilatation and curettage, laparoscopic biopsy, fulguration of endometriosis, lysis and excision of adhesions.  SURGEON:  Timothy P. Fontaine, M.D.  ANESTHESIA:  General endotracheal.  COMPLICATIONS:  None.  ESTIMATED BLOOD LOSS:  Minimal.  SORBITOL DISCREPANCY:  Minimal.  SPECIMENS: 1. Endometrial polyp. 2. Endometrial curetting. 3. Peritoneal biopsy. 4. Cul-de-sac biopsy. 5. Adhesions.  FINDINGS:  Hysteroscopy, large sessile posterior wall endometrial polyp excised.  Otherwise hysteroscopy normal noting fundus, anterior posterior uterine surfaces, right and left tubal ostia, lower uterine segmend and endocervical canal all adequately visualized.  Laparoscopy anterior cul-de-sac normal.  Posterior cul-de-sac with multiple areas of classic endometriotic implants both powderburn and white fibrotic involving an area both on the right and left miduterosacral ligaments in a midcul-de-sac area where the junction of the cul-de-sac and the lower uterine segment.  Uterus overall normal in size subserosal near pedunculated anterior leiomyomata, right and left fallopian tubes with evidence of prior tubal sterilization with small endometriomas in the mesosalpinx at the area of gap between the proximal and distal segments bilaterally.  Both biopsied and subsequently fulgurated. Right and left ovaries grossly normal, free and mobile.  Left pelvic sidewall with classic powderburn endometriotic implant high above the  ureter. Inflammatory type clear cysts and adhesions filmy across the entire posterior cul-de-sac which were removed and sent as separate specimens.  Appendix grossly normal, free and mobile.  Liver smooth, no abnormalities.  Gallbladder not visualized.  DESCRIPTION OF PROCEDURE:  The patient was taken to the operating room and underwent general endotracheal anesthesia.  She was placed in low dorsal lithotomy position and received abdominal-perineal-vaginal preparation with Betadine scrub and Betadine solution.  The bladder was emptied with in and out Foley catheterization.  The patient was draped in the usual fashion.  The cervix was grasped with a single-toothed tenaculum and the cervix gently dilated to admit the diagnostic hysteroscope.  Initial hysteroscopy revealed a large posterior wall polyp and subsequently the resectoscopic hystereoscope was used with a right angle loop to excise the polyp in a single pass.  A sharp curettage was then performed and subsequently rehysteroscoyp showed a clear normal-appearing empty cavity with good distention and no evidence of perforation.  A Hulka tenaculum was then placed on the cervix.  Attention was then turned to laparoscopy after the surgeon and assistant regloved. Infraumbilical transverse incision was made at her prior tubal site and the Veress needle was placed with its position verified with water and 2L of carbon dioxide gas infused without difficulty.  The 10 mm laparoscopic trocar was then placed without difficulty, its position verified visually.  The suprapubic 5 mm port was then placed under direct visualization after transillumination  for the vessels without difficulty.  Examination of the pelvic organs and upper abdominal exam was then carried out with findings noted above.  Of note, before starting the laparoscopy at the end of hysteroscopy, an indwelling red Robinson catheter was placed to  redrain the bladder and was left in  place during the procedure.  Multiple peritoneal biopsies were then obtained for documentation of her endometriosis and a relatively large, red, shaggy-appearing patch endometriosis was then excised from the posterior cul-de-sac and sent as a separate specimen.  The adhesive cystic changes filmy in the posterior cul-de-sac were also excised and sent as separate specimens.  The right and the left small blue domed type cyst at the area of the mesosalpinx between the two tubal segments bilaterally were both biopsied with brownish material extruding consistent with small endometriomas of both areas.  After obtaining the specimens, bipolar cautery was used to obliterate all areas of endometriosis and prior biopsy sites either a brushing technique or in a direct bipolar application.  The ureter was identified on both sides prior to any biopsy and fulguration and was always kept far from the operative field.  The pelvis was copiously irrigated.  Adequate hemostasis visualized and subsequently the suprapubic port removed, the gas allowed to escape.  All sites reinspected under a low pressure situation showing adequate hemostasis and the infrumbilical port backed out under direct visualization wo bleeding or hernia formation.  The 0 Vicryl interrupted subcutaneous fascial stitch was placed infraumbilically due to the prior scarring in the skin. Subcuticular stitch led to puckering and subsequently just through-and-through cutaneous sutures were placed using 4-0 Vicryl suture to reapproximate the skin.  The superpubic port was reapproximated with a single subcuticular stitch.  Both skin incisions were infiltrated using 0.25% Marcaine. The indwelling red Robinson removed.  Hulka tenaculum removed.  The patient placed in supine position awakened without difficulty and taken to recovery room in good condition having tolerated the procedure well. DD:  01/30/00 TD:  01/31/00 Job: [id number]

## 2011-02-15 LAB — URINE CULTURE
Colony Count: NO GROWTH
Culture: NO GROWTH

## 2011-02-15 LAB — CBC
HCT: 24.1 — ABNORMAL LOW
HCT: 26.1 — ABNORMAL LOW
HCT: 30.2 — ABNORMAL LOW
HCT: 30.8 — ABNORMAL LOW
Hemoglobin: 7.8 — CL
Hemoglobin: 8.1 — ABNORMAL LOW
Hemoglobin: 9.5 — ABNORMAL LOW
Hemoglobin: 9.8 — ABNORMAL LOW
MCHC: 31.1
MCHC: 31.7
MCHC: 32.3
MCV: 65.8 — ABNORMAL LOW
MCV: 66 — ABNORMAL LOW
MCV: 67.2 — ABNORMAL LOW
Platelets: 203
Platelets: 203
Platelets: 210
RBC: 3.66 — ABNORMAL LOW
RBC: 3.89
RBC: 4.66
RDW: 19.1 — ABNORMAL HIGH
RDW: 19.1 — ABNORMAL HIGH
RDW: 19.2 — ABNORMAL HIGH
RDW: 19.2 — ABNORMAL HIGH
WBC: 14.4 — ABNORMAL HIGH
WBC: 18.9 — ABNORMAL HIGH
WBC: 19.2 — ABNORMAL HIGH
WBC: 21 — ABNORMAL HIGH

## 2011-02-15 LAB — DIFFERENTIAL
Basophils Absolute: 0
Basophils Relative: 0
Eosinophils Absolute: 0
Eosinophils Relative: 0
Lymphocytes Relative: 4 — ABNORMAL LOW
Lymphs Abs: 0.8
Monocytes Absolute: 0.2
Monocytes Relative: 1 — ABNORMAL LOW
Neutro Abs: 18.2 — ABNORMAL HIGH
Neutrophils Relative %: 95 — ABNORMAL HIGH
WBC Morphology: INCREASED

## 2011-02-15 LAB — COMPREHENSIVE METABOLIC PANEL
ALT: 15
AST: 18
Albumin: 4.2
Alkaline Phosphatase: 95
BUN: 7
CO2: 23
Calcium: 9.3
Chloride: 104
Creatinine, Ser: 0.82
GFR calc Af Amer: >60
GFR calc non Af Amer: >60
Glucose, Bld: 115 — ABNORMAL HIGH
Potassium: 4
Sodium: 137
Total Bilirubin: 0.5
Total Protein: 8.1

## 2011-02-15 LAB — CULTURE, BLOOD (ROUTINE X 2)
Culture: NO GROWTH
Culture: NO GROWTH
Culture: NO GROWTH
Culture: NO GROWTH

## 2011-02-15 LAB — URINALYSIS, ROUTINE W REFLEX MICROSCOPIC
Bilirubin Urine: NEGATIVE
Glucose, UA: NEGATIVE
Hgb urine dipstick: NEGATIVE
Ketones, ur: NEGATIVE
Nitrite: NEGATIVE
Protein, ur: NEGATIVE
Specific Gravity, Urine: 1.012
Urobilinogen, UA: 0.2
pH: 6

## 2011-02-15 LAB — CK TOTAL AND CKMB (NOT AT ARMC)
CK, MB: 0.7
CK, MB: 1.5
CK, MB: 1.7
Relative Index: 0.6
Relative Index: 1.4
Relative Index: 1.4
Total CK: 106
Total CK: 115
Total CK: 124

## 2011-02-15 LAB — IRON: Iron: 14 — ABNORMAL LOW

## 2011-02-15 LAB — URINE MICROSCOPIC-ADD ON

## 2011-02-15 LAB — HEMOGLOBIN AND HEMATOCRIT, BLOOD
HCT: 25.8 — ABNORMAL LOW
Hemoglobin: 8 — ABNORMAL LOW

## 2011-02-15 LAB — TROPONIN I: Troponin I: 0.03

## 2011-02-15 LAB — FERRITIN: Ferritin: 3 — ABNORMAL LOW (ref 10–291)

## 2012-05-06 DIAGNOSIS — D649 Anemia, unspecified: Secondary | ICD-10-CM

## 2012-05-06 HISTORY — DX: Anemia, unspecified: D64.9

## 2012-05-23 ENCOUNTER — Observation Stay (HOSPITAL_BASED_OUTPATIENT_CLINIC_OR_DEPARTMENT_OTHER)
Admission: EM | Admit: 2012-05-23 | Discharge: 2012-05-25 | DRG: 068 | Disposition: A | Discharge status: Home / Self Care | Payer: BC Managed Care – PPO | Attending: Internal Medicine | Admitting: Internal Medicine

## 2012-05-23 ENCOUNTER — Emergency Department (HOSPITAL_BASED_OUTPATIENT_CLINIC_OR_DEPARTMENT_OTHER): Payer: BC Managed Care – PPO

## 2012-05-23 ENCOUNTER — Encounter (HOSPITAL_BASED_OUTPATIENT_CLINIC_OR_DEPARTMENT_OTHER): Payer: Self-pay | Admitting: *Deleted

## 2012-05-23 DIAGNOSIS — Z8679 Personal history of other diseases of the circulatory system: Secondary | ICD-10-CM

## 2012-05-23 DIAGNOSIS — R209 Unspecified disturbances of skin sensation: Secondary | ICD-10-CM | POA: Insufficient documentation

## 2012-05-23 DIAGNOSIS — J09X2 Influenza due to identified novel influenza A virus with other respiratory manifestations: Secondary | ICD-10-CM | POA: Insufficient documentation

## 2012-05-23 DIAGNOSIS — D509 Iron deficiency anemia, unspecified: Secondary | ICD-10-CM | POA: Insufficient documentation

## 2012-05-23 DIAGNOSIS — N924 Excessive bleeding in the premenopausal period: Secondary | ICD-10-CM | POA: Insufficient documentation

## 2012-05-23 DIAGNOSIS — J111 Influenza due to unidentified influenza virus with other respiratory manifestations: Secondary | ICD-10-CM

## 2012-05-23 DIAGNOSIS — R5383 Other fatigue: Secondary | ICD-10-CM | POA: Insufficient documentation

## 2012-05-23 DIAGNOSIS — R5381 Other malaise: Secondary | ICD-10-CM | POA: Insufficient documentation

## 2012-05-23 DIAGNOSIS — J101 Influenza due to other identified influenza virus with other respiratory manifestations: Principal | ICD-10-CM | POA: Insufficient documentation

## 2012-05-23 DIAGNOSIS — D649 Anemia, unspecified: Secondary | ICD-10-CM | POA: Diagnosis present

## 2012-05-23 DIAGNOSIS — R509 Fever, unspecified: Secondary | ICD-10-CM | POA: Insufficient documentation

## 2012-05-23 DIAGNOSIS — E0590 Thyrotoxicosis, unspecified without thyrotoxic crisis or storm: Secondary | ICD-10-CM | POA: Insufficient documentation

## 2012-05-23 DIAGNOSIS — D62 Acute posthemorrhagic anemia: Secondary | ICD-10-CM | POA: Insufficient documentation

## 2012-05-23 DIAGNOSIS — R002 Palpitations: Secondary | ICD-10-CM

## 2012-05-23 DIAGNOSIS — R05 Cough: Secondary | ICD-10-CM | POA: Insufficient documentation

## 2012-05-23 DIAGNOSIS — J069 Acute upper respiratory infection, unspecified: Secondary | ICD-10-CM | POA: Diagnosis present

## 2012-05-23 DIAGNOSIS — L500 Allergic urticaria: Secondary | ICD-10-CM

## 2012-05-23 DIAGNOSIS — R059 Cough, unspecified: Secondary | ICD-10-CM | POA: Insufficient documentation

## 2012-05-23 HISTORY — DX: Unspecified convulsions: R56.9

## 2012-05-23 LAB — CBC WITH DIFFERENTIAL/PLATELET
Basophils Relative: 0 % (ref 0–1)
Eosinophils Relative: 0 % (ref 0–5)
HCT: 21.2 % — ABNORMAL LOW (ref 36.0–46.0)
Hemoglobin: 5.8 g/dL — CL (ref 12.0–15.0)
MCH: 17.5 pg — ABNORMAL LOW (ref 26.0–34.0)
MCHC: 27.4 g/dL — ABNORMAL LOW (ref 30.0–36.0)
MCV: 64 fL — ABNORMAL LOW (ref 78.0–100.0)
Monocytes Absolute: 1 10*3/uL (ref 0.1–1.0)
Monocytes Relative: 19 % — ABNORMAL HIGH (ref 3–12)
Neutro Abs: 3.8 10*3/uL (ref 1.7–7.7)

## 2012-05-23 LAB — HEMOGLOBIN AND HEMATOCRIT, BLOOD: Hemoglobin: 5.7 g/dL — CL (ref 12.0–15.0)

## 2012-05-23 LAB — BASIC METABOLIC PANEL
BUN: 8 mg/dL (ref 6–23)
Creatinine, Ser: 0.7 mg/dL (ref 0.50–1.10)
GFR calc non Af Amer: >90 mL/min (ref >90)
Glucose, Bld: 119 mg/dL — ABNORMAL HIGH (ref 70–99)
Potassium: 3.6 mEq/L (ref 3.5–5.1)

## 2012-05-23 LAB — TROPONIN I: Troponin I: <0.3 ng/mL (ref <0.30)

## 2012-05-23 MED ORDER — SODIUM CHLORIDE 0.9 % IV SOLN: 500.0000 mL | Freq: Once | INTRAVENOUS | Status: DC

## 2012-05-23 MED ORDER — OSELTAMIVIR PHOSPHATE 75 MG PO CAPS
75.0000 mg | ORAL_CAPSULE | Freq: Once | ORAL | Status: AC
  Administered 2012-05-23: 75 mg via ORAL
  Filled 2012-05-23: qty 1

## 2012-05-23 MED ORDER — DIPHENHYDRAMINE HCL 25 MG PO CAPS
25.0000 mg | ORAL_CAPSULE | Freq: Once | ORAL | Status: AC
  Administered 2012-05-24: 25 mg via ORAL
  Filled 2012-05-23: qty 1

## 2012-05-23 MED ORDER — ONDANSETRON HCL 4 MG PO TABS: 4.0000 mg | ORAL_TABLET | Freq: Four times a day (QID) | ORAL | Status: DC | PRN

## 2012-05-23 MED ORDER — ACETAMINOPHEN 650 MG RE SUPP: 650.0000 mg | Freq: Four times a day (QID) | RECTAL | Status: DC | PRN

## 2012-05-23 MED ORDER — ACETAMINOPHEN 325 MG PO TABS: 650.0000 mg | ORAL_TABLET | Freq: Once | ORAL | Status: DC

## 2012-05-23 MED ORDER — DOCUSATE SODIUM 100 MG PO CAPS
100.0000 mg | ORAL_CAPSULE | Freq: Two times a day (BID) | ORAL | Status: DC
  Administered 2012-05-24 – 2012-05-25 (×3): 100 mg via ORAL
  Filled 2012-05-23 (×4): qty 1

## 2012-05-23 MED ORDER — HYDROCODONE-ACETAMINOPHEN 5-325 MG PO TABS
1.0000 | ORAL_TABLET | ORAL | Status: DC | PRN
  Administered 2012-05-24: 2 via ORAL
  Filled 2012-05-23: qty 2

## 2012-05-23 MED ORDER — SODIUM CHLORIDE 0.9 % IV SOLN
INTRAVENOUS | Status: AC
  Administered 2012-05-23: via INTRAVENOUS

## 2012-05-23 MED ORDER — SODIUM CHLORIDE 0.9 % IJ SOLN
3.0000 mL | Freq: Two times a day (BID) | INTRAMUSCULAR | Status: DC
  Administered 2012-05-23 – 2012-05-25 (×3): 3 mL via INTRAVENOUS

## 2012-05-23 MED ORDER — ACETAMINOPHEN 325 MG PO TABS
650.0000 mg | ORAL_TABLET | Freq: Once | ORAL | Status: AC
  Administered 2012-05-23: 650 mg via ORAL

## 2012-05-23 MED ORDER — ACETAMINOPHEN 325 MG PO TABS
650.0000 mg | ORAL_TABLET | Freq: Four times a day (QID) | ORAL | Status: DC | PRN
  Administered 2012-05-24: 650 mg via ORAL
  Filled 2012-05-23: qty 2

## 2012-05-23 MED ORDER — ACETAMINOPHEN 325 MG PO TABS
ORAL_TABLET | ORAL | Status: AC
  Administered 2012-05-23: 650 mg via ORAL
  Filled 2012-05-23: qty 2

## 2012-05-23 MED ORDER — ONDANSETRON HCL 4 MG/2ML IJ SOLN
4.0000 mg | Freq: Four times a day (QID) | INTRAMUSCULAR | Status: DC | PRN
  Filled 2012-05-23: qty 2

## 2012-05-23 NOTE — ED Notes (Signed)
Assigned to room 3W36 @ [facility]. [facility] called for transport. RN aware.

## 2012-05-23 NOTE — ED Notes (Signed)
EDP Linwood Dibbles made aware of pt status and has EKG to review

## 2012-05-23 NOTE — ED Notes (Signed)
Pt reports sudden onset of left facial numbness after coughing attack this morning- states sx have improved but left side of face still "feels funny"

## 2012-05-23 NOTE — ED Provider Notes (Signed)
History   Scribed for April Kras, MD, the patient was seen in room MH10/MH10 . This chart was scribed by Lewanda Rife.   CSN: [id number]  Arrival date & time 05/23/12  1652   First MD Initiated Contact with Patient 05/23/12 1755      Chief Complaint  Patient presents with  . Numbness    (Consider location/radiation/quality/duration/timing/severity/associated sxs/prior treatment) HPI April Gonzalez is a 52 y.o. female who presents to the Emergency Department complaining of acute left-sided facial numbness, left-sided hand numbness, and left-sided foot numbness following a "coughing attack" this morning. Pt states symptoms have improved, but left-sided numbness is still present. Pt additionally complains of fever, weakness, fatigue, productive cough with yellow sputum, and post-tussive emesis with yellow sputum since yesterday. Pt denies chest pain, and shortness of breath. Pt reports she did not get a flu shot this year. Pt denies hx of stroke.  Past Medical History  Diagnosis Date  . Seizures     last seizure age 61 or 10 per pt report  . RA (rheumatoid arthritis)     states was dx at age 60 but not on meds    Past Surgical History  Procedure Date  . Excision vaginal cyst   . Tubal ligation     No family history on file.  History  Substance Use Topics  . Smoking status: Never Smoker   . Smokeless tobacco: Never Used  . Alcohol Use: No    OB History    Grav Para Term Preterm Abortions TAB SAB Ect Mult Living                  Review of Systems  Constitutional: Positive for fever.  HENT: Negative.   Respiratory: Positive for cough. Negative for shortness of breath.   Cardiovascular: Negative.  Negative for chest pain.  Gastrointestinal: Positive for nausea and vomiting.  Musculoskeletal: Positive for myalgias (generalized ).  Skin: Negative.   Neurological: Positive for numbness (left sided facial and left hand).  Hematological: Negative.     Psychiatric/Behavioral: Negative.   All other systems reviewed and are negative.    Allergies  Methimazole; Nitroglycerin; and Tetracycline hcl  Home Medications   Current Outpatient Rx  Name  Route  Sig  Dispense  Refill  . ALKA-SELTZER PLUS COLD PO   Oral   Take by mouth.           BP 151/73  Pulse 95  Temp 101.7 F (38.7 C) (Oral)  Resp 29  Ht 5\' 4"  (1.626 m)  Wt 165 lb (74.844 kg)  BMI 28.32 kg/m2  SpO2 98%  LMP 05/08/2012  Physical Exam  Nursing note and vitals reviewed. Constitutional: She appears well-developed and well-nourished. No distress.  HENT:  Head: Normocephalic and atraumatic.  Right Ear: External ear normal.  Left Ear: External ear normal.  Eyes: Conjunctivae normal are normal. Right eye exhibits no discharge. Left eye exhibits no discharge. No scleral icterus.  Neck: Neck supple. No tracheal deviation present.  Cardiovascular: Normal rate, regular rhythm and intact distal pulses.   Pulmonary/Chest: Effort normal and breath sounds normal. No stridor. No respiratory distress. She has no wheezes. She has no rales.  Abdominal: Soft. Bowel sounds are normal. She exhibits no distension. There is no tenderness. There is no rebound and no guarding.  Musculoskeletal: She exhibits no edema and no tenderness.  Neurological: She is alert. She has normal strength. No sensory deficit. Cranial nerve deficit:  no gross defecits noted. She exhibits  normal muscle tone. She displays no seizure activity. Coordination normal.       Subjective altered sensation left facial, left hand, left foot  Skin: Skin is warm and dry. No rash noted.  Psychiatric: Her affect is blunt.    ED Course  Procedures (including critical care time) EKG Rate 94 Normal sinus rhythm Possible Left atrial enlargement Nonspecific T wave abnormality Abnormal ECG No sig changes Labs Reviewed  CBC WITH DIFFERENTIAL - Abnormal; Notable for the following:    RBC 3.31 (*)     Hemoglobin  5.8 (*)     HCT 21.2 (*)     MCV 64.0 (*)     MCH 17.5 (*)     MCHC 27.4 (*)     RDW 26.0 (*)     Lymphocytes Relative 11 (*)     Lymphs Abs 0.6 (*)     Monocytes Relative 19 (*)     All other components within normal limits  BASIC METABOLIC PANEL - Abnormal; Notable for the following:    Sodium 133 (*)     Glucose, Bld 119 (*)     All other components within normal limits  HEMOGLOBIN AND HEMATOCRIT, BLOOD - Abnormal; Notable for the following:    Hemoglobin 5.7 (*)     HCT 20.8 (*)     All other components within normal limits  TROPONIN I  OCCULT BLOOD X 1 CARD TO LAB, STOOL  INFLUENZA PANEL BY PCR  URINALYSIS, ROUTINE W REFLEX MICROSCOPIC   Dg Chest 2 View  05/23/2012  *RADIOLOGY REPORT*  Clinical Data: Numbness  CHEST - 2 VIEW  Comparison: Chest radiograph 05/20/2009  Findings: Normal heart, mediastinal, and hilar contours.  Normal pulmonary vascularity.  Midline trachea.  Lung volumes slightly low.  The lungs are clear.  There is no pleural effusion or pneumothorax.  Bones are unremarkable.  IMPRESSION: No acute cardiopulmonary disease.   Original Report Authenticated By: Britta Mccreedy, M.D.    Ct Head Wo Contrast  05/23/2012  *RADIOLOGY REPORT*  Clinical Data:  Dizziness, weakness, headache  CT HEAD WITHOUT CONTRAST  Technique:  Contiguous axial images were obtained from the base of the skull through the vertex without contrast  Comparison:  CT head 07/05/2009.  CTA 12/19/2009.  Findings:  The brain has a normal appearance without evidence for hemorrhage, acute infarction, hydrocephalus, or mass lesion.  There is no extra axial fluid collection.  The skull and paranasal sinuses are normal. No change from priors.  IMPRESSION: Normal CT of the head without contrast.   Original Report Authenticated By: Davonna Belling, M.D.      1. Influenza-like illness   2. Anemia       MDM  Anemia Pt has history of heavy menses.  Suspect that is the etiology for her low mcv anemia, likely  iron.  Fever Like ILI.  No pna.  Platelets normal, no sign of hemolytic anemia.  Numbness Doubt CVA.  Possibly related to her ili.   Plan on transfer and admission to [place].  No blood is available here.  Pt will need blood transfusion once transferrred.  I personally performed the services described in this documentation, which was scribed in my presence. The recorded information has been reviewed and is accurate.    April Kras, MD 05/23/12 [phone]

## 2012-05-24 ENCOUNTER — Encounter (HOSPITAL_COMMUNITY): Payer: Self-pay | Admitting: *Deleted

## 2012-05-24 DIAGNOSIS — D649 Anemia, unspecified: Secondary | ICD-10-CM | POA: Diagnosis present

## 2012-05-24 DIAGNOSIS — J069 Acute upper respiratory infection, unspecified: Secondary | ICD-10-CM | POA: Diagnosis present

## 2012-05-24 LAB — COMPREHENSIVE METABOLIC PANEL
AST: 19 U/L (ref 0–37)
Alkaline Phosphatase: 78 U/L (ref 39–117)
BUN: 10 mg/dL (ref 6–23)
CO2: 22 mEq/L (ref 19–32)
Chloride: 99 mEq/L (ref 96–112)
Creatinine, Ser: 0.76 mg/dL (ref 0.50–1.10)
GFR calc non Af Amer: >90 mL/min (ref >90)
Potassium: 3.5 mEq/L (ref 3.5–5.1)
Total Bilirubin: 1.4 mg/dL — ABNORMAL HIGH (ref 0.3–1.2)

## 2012-05-24 LAB — CBC
MCH: 20.5 pg — ABNORMAL LOW (ref 26.0–34.0)
MCV: 68.7 fL — ABNORMAL LOW (ref 78.0–100.0)
Platelets: 111 10*3/uL — ABNORMAL LOW (ref 150–400)
RBC: 4.15 MIL/uL (ref 3.87–5.11)
RDW: 27.2 % — ABNORMAL HIGH (ref 11.5–15.5)
WBC: 6.1 10*3/uL (ref 4.0–10.5)

## 2012-05-24 LAB — IRON AND TIBC
Iron: 24 ug/dL — ABNORMAL LOW (ref 42–135)
TIBC: 525 ug/dL — ABNORMAL HIGH (ref 250–470)
UIBC: 501 ug/dL — ABNORMAL HIGH (ref 125–400)

## 2012-05-24 LAB — INFLUENZA PANEL BY PCR (TYPE A & B): Influenza A By PCR: POSITIVE — AB

## 2012-05-24 LAB — URINALYSIS, ROUTINE W REFLEX MICROSCOPIC
Glucose, UA: NEGATIVE mg/dL
Hgb urine dipstick: NEGATIVE
Protein, ur: NEGATIVE mg/dL
Specific Gravity, Urine: 1.011 (ref 1.005–1.030)
pH: 5.5 (ref 5.0–8.0)

## 2012-05-24 LAB — APTT: aPTT: 33 seconds (ref 24–37)

## 2012-05-24 LAB — PREPARE RBC (CROSSMATCH)

## 2012-05-24 LAB — FERRITIN: Ferritin: 6 ng/mL — ABNORMAL LOW (ref 10–291)

## 2012-05-24 LAB — RETICULOCYTES: Retic Ct Pct: 0.9 % (ref 0.4–3.1)

## 2012-05-24 MED ORDER — MEGESTROL ACETATE 40 MG PO TABS
40.0000 mg | ORAL_TABLET | Freq: Every day | ORAL | Status: DC
  Administered 2012-05-24 – 2012-05-25 (×2): 40 mg via ORAL
  Filled 2012-05-24 (×2): qty 1

## 2012-05-24 MED ORDER — FERROUS SULFATE 325 (65 FE) MG PO TABS
325.0000 mg | ORAL_TABLET | Freq: Three times a day (TID) | ORAL | Status: DC
  Administered 2012-05-24 – 2012-05-25 (×4): 325 mg via ORAL
  Filled 2012-05-24 (×6): qty 1

## 2012-05-24 MED ORDER — LEVOFLOXACIN 750 MG PO TABS
750.0000 mg | ORAL_TABLET | Freq: Every day | ORAL | Status: DC
  Administered 2012-05-24 – 2012-05-25 (×2): 750 mg via ORAL
  Filled 2012-05-24 (×2): qty 1

## 2012-05-24 MED ORDER — OSELTAMIVIR PHOSPHATE 75 MG PO CAPS
75.0000 mg | ORAL_CAPSULE | Freq: Two times a day (BID) | ORAL | Status: DC
  Administered 2012-05-24 – 2012-05-25 (×3): 75 mg via ORAL
  Filled 2012-05-24 (×4): qty 1

## 2012-05-24 NOTE — H&P (Signed)
Patient reports some cough for the past 2 days with fever as high as 102 at home. She have not had her flu shot this year. She has of heavy menses with chronic anemia requiring transfusions in the past.  Yesterday she was having sever cough, chills and pain in her left leg and left side of her face. She also endorses pain in her chest with coughing. She was evaluated at [facility] and had hg at 5.7  she was transferred to [facility] from [facility] for further evaluation. Flu swab was done and she was started on Tamiflu.  After receiving PRBC she started to feel better.  She denies any numbness tingling or weakness in her extremities.   Will admit and monitor overnight transfuse packed red blood cells and obtain anemia panel prior to this. I suspect influenza-like illness she is already on Tamiflu flu nasal swab is pending. She was already started on Levaquin for coverage of potential pulmonary infection. Patient is hemodynamically stable and improving. Blood cultures nonetheless were obtained.  When I interviewed her she did not endorse any neurological complaints and was neurologically intact. Her neurological symptoms were typical and history somewhat inconsistent, CT scan unremarkable we'll hold off on further studies at this point. Given her menorrhagia she'll likely have persistent anemia and would need to be seen by GYN in the near future to provide a definitive treatment at discharge would make sure she is on iron supplementation.  Patient had been seen and examined agree with above nurse practitioner's note.  Gerrie Castiglia 4:50 AM

## 2012-05-24 NOTE — H&P (Signed)
Chief Complaint: fevers, cough HPI: April Gonzalez is a 52 year old African American female who presented to [facility] with complaints of cough, one episode of post-tussive emesis, and tactile fevers. The cough began on Friday 1/17 and is productive of a yellow sputum. She has chest pain with coughing only. She also reports rhinorrhea, tactile fevers, generalized weakness and myalgias, and pressure in her ears. At [facility] the patient was noted to have a hemoglobin of 5.8. Patient does have a history of menorrhagia and subsequent iron deficiency anemia. The patient still has monthly menstruation and her most recent menses began 2 weeks ago and ended on 1/17. She stated that it started off light but gradually increased in amount over the second week. She has a history of fibroids and has had an endometrial polyp in the past. Patient was transferred to [facility] for admission for her flu-like symptoms as well as for blood transfusion. Of note patient did experience a brief episode of numbness in the left side of her face and left arm following a coughing episode, but this resolved quickly. She continues to report a headache of moderate intensity. Head CT was negative.  Past Medical History  Diagnosis Date  . Seizures     last seizure age 38 or 10 per pt report  . RA (rheumatoid arthritis)     states was dx at age 34 but not on meds       History of hyperthyroidism      Endometriosis      Iron deficiency anemia      Fibroids  Past Surgical History  Procedure Date  . Excision vaginal cyst   . Tubal ligation         Hysteroscopy, showed endometrial polyp        History reviewed. No pertinent family history. No family history of abnormal uterine bleeding or uterine cancer that patient is aware of. Social History:  reports that she has never smoked. She has never used smokeless tobacco. She reports that she does not drink alcohol or use illicit drugs. Married, husband at bedside.  Allergies:  Allergies  Allergen  Reactions  . Methimazole     REACTION: anaphylaxis  . Nitroglycerin     REACTION: sweating  . Tetracycline Hcl     REACTION: elevated blood pressure    Medications Prior to Admission  Medication Sig Dispense Refill  . Chlorphen-Phenyleph-ASA (ALKA-SELTZER PLUS COLD PO) Take by mouth.        Results for orders placed during the hospital encounter of 05/23/12 (from the past 48 hour(s))  CBC WITH DIFFERENTIAL     Status: Abnormal   Collection Time   05/23/12  5:50 PM      Component Value Range Comment   WBC 5.5  4.0 - 10.5 K/uL    RBC 3.31 (*) 3.87 - 5.11 MIL/uL    Hemoglobin 5.8 (*) 12.0 - 15.0 g/dL    HCT 16.1 (*) 09.6 - 46.0 %    MCV 64.0 (*) 78.0 - 100.0 fL    MCH 17.5 (*) 26.0 - 34.0 pg    MCHC 27.4 (*) 30.0 - 36.0 g/dL    RDW 04.5 (*) 40.9 - 15.5 %    Platelets 154  150 - 400 K/uL    Neutrophils Relative 70  43 - 77 %    Neutro Abs 3.8  1.7 - 7.7 K/uL    Lymphocytes Relative 11 (*) 12 - 46 %    Lymphs Abs 0.6 (*) 0.7 - 4.0  K/uL    Monocytes Relative 19 (*) 3 - 12 %    Monocytes Absolute 1.0  0.1 - 1.0 K/uL    Eosinophils Relative 0  0 - 5 %    Eosinophils Absolute 0.0  0.0 - 0.7 K/uL    Basophils Relative 0  0 - 1 %    Basophils Absolute 0.0  0.0 - 0.1 K/uL    RBC Morphology POLYCHROMASIA PRESENT     BASIC METABOLIC PANEL     Status: Abnormal   Collection Time   05/23/12  5:50 PM      Component Value Range Comment   Sodium 133 (*) 135 - 145 mEq/L    Potassium 3.6  3.5 - 5.1 mEq/L    Chloride 99  96 - 112 mEq/L    CO2 21  19 - 32 mEq/L    Glucose, Bld 119 (*) 70 - 99 mg/dL    BUN 8  6 - 23 mg/dL    Creatinine, Ser 8.11  0.50 - 1.10 mg/dL    Calcium 8.9  8.4 - 91.4 mg/dL    GFR calc non Af Amer >90  >90 mL/min    GFR calc Af Amer >90  >90 mL/min   TROPONIN I     Status: Normal   Collection Time   05/23/12  5:50 PM      Component Value Range Comment   Troponin I <0.30  <0.30 ng/mL   HEMOGLOBIN AND HEMATOCRIT, BLOOD     Status: Abnormal   Collection Time    05/23/12  6:45 PM      Component Value Range Comment   Hemoglobin 5.7 (*) 12.0 - 15.0 g/dL    HCT 78.2 (*) 95.6 - 46.0 %   OCCULT BLOOD X 1 CARD TO LAB, STOOL     Status: Normal   Collection Time   05/23/12  8:16 PM      Component Value Range Comment   Fecal Occult Bld NEGATIVE  NEGATIVE   TYPE AND SCREEN     Status: Normal (Preliminary result)   Collection Time   05/24/12 12:30 AM      Component Value Range Comment   ABO/RH(D) B POS      Antibody Screen NEG      Sample Expiration 05/27/2012      Unit Number [id number]      Blood Component Type RED CELLS,LR      Unit division 00      Status of Unit ISSUED      Transfusion Status OK TO TRANSFUSE      Crossmatch Result Compatible      Unit Number [id number]      Blood Component Type RED CELLS,LR      Unit division 00      Status of Unit ALLOCATED      Transfusion Status OK TO TRANSFUSE      Crossmatch Result Compatible     PREPARE RBC (CROSSMATCH)     Status: Normal   Collection Time   05/24/12 12:30 AM      Component Value Range Comment   Order Confirmation ORDER PROCESSED BY BLOOD BANK     RETICULOCYTES     Status: Abnormal   Collection Time   05/24/12 12:30 AM      Component Value Range Comment   Retic Ct Pct 0.9  0.4 - 3.1 %    RBC. 3.36 (*) 3.87 - 5.11 MIL/uL    Retic Count, Manual 30.2  19.0 - 186.0  K/uL    Dg Chest 2 View  05/23/2012  *RADIOLOGY REPORT*  Clinical Data: Numbness  CHEST - 2 VIEW  Comparison: Chest radiograph 05/20/2009  Findings: Normal heart, mediastinal, and hilar contours.  Normal pulmonary vascularity.  Midline trachea.  Lung volumes slightly low.  The lungs are clear.  There is no pleural effusion or pneumothorax.  Bones are unremarkable.  IMPRESSION: No acute cardiopulmonary disease.   Original Report Authenticated By: Britta Mccreedy, M.D.    Ct Head Wo Contrast  05/23/2012  *RADIOLOGY REPORT*  Clinical Data:  Dizziness, weakness, headache  CT HEAD WITHOUT CONTRAST  Technique:  Contiguous axial  images were obtained from the base of the skull through the vertex without contrast  Comparison:  CT head 07/05/2009.  CTA 12/19/2009.  Findings:  The brain has a normal appearance without evidence for hemorrhage, acute infarction, hydrocephalus, or mass lesion.  There is no extra axial fluid collection.  The skull and paranasal sinuses are normal. No change from priors.  IMPRESSION: Normal CT of the head without contrast.   Original Report Authenticated By: Davonna Belling, M.D.     Review of Systems  Constitutional: Positive for fever, chills and malaise/fatigue.  HENT: Negative for hearing loss, ear pain, congestion, sore throat, neck pain and ear discharge.   Eyes: Negative for blurred vision, double vision, pain, discharge and redness.  Respiratory: Positive for cough and sputum production. Negative for hemoptysis and shortness of breath.   Cardiovascular: Positive for chest pain (with coughing). Negative for palpitations.  Gastrointestinal: Positive for vomiting (post-tussive x 1). Negative for nausea, abdominal pain, diarrhea, constipation and blood in stool.  Genitourinary: Negative for dysuria, urgency and frequency.  Musculoskeletal: Positive for myalgias. Negative for joint pain.  Skin: Negative for rash.  Neurological: Positive for dizziness, tingling (briefly in left side of face and left arm, now resolved), weakness and headaches.  Endo/Heme/Allergies: Does not bruise/bleed easily.  Psychiatric/Behavioral: Negative.     Blood pressure 133/65, pulse 90, temperature 102.4 F (39.1 C), temperature source Oral, resp. rate 20, height 5\' 3"  (1.6 m), weight 75.7 kg (166 lb 14.2 oz), last menstrual period 05/22/2012, SpO2 98.00%. Physical Exam  Constitutional: She is oriented to person, place, and time. She appears well-developed and well-nourished. No distress.       Lethargic but awake and alert, oriented  HENT:  Head: Normocephalic and atraumatic.  Eyes: Conjunctivae normal and EOM are  normal. Pupils are equal, round, and reactive to light.  Neck: Neck supple. No thyromegaly present.  Cardiovascular: Normal rate, regular rhythm and normal heart sounds.  Exam reveals no gallop and no friction rub.   No murmur heard. Respiratory: Effort normal and breath sounds normal. No respiratory distress. She has no wheezes. She has no rales. She exhibits no tenderness.  GI: Soft. Bowel sounds are normal. She exhibits no distension and no mass. There is no tenderness.  Genitourinary: Guaiac negative stool.  Musculoskeletal: She exhibits no edema and no tenderness.  Lymphadenopathy:    She has no cervical adenopathy.  Neurological: She is alert and oriented to person, place, and time. No cranial nerve deficit.  Skin: Skin is warm and dry. No rash noted.  Psychiatric: She has a normal mood and affect. Her behavior is normal.     Assessment/Plan 1.) Acute febrile illness: possible influenza vs. Acute bronchitis vs. Viral syndrome. WBC normal with no shifting of differential. Will treat with tamiflu pending flu PCR. She is also experiencing productive cough, will cover for acute bacterial bronchitis  with levaquin. Still awaiting UA. Blood cultures have also been done.  2.) Anemia: Acute blood loss anemia on top of underlying chronic iron deficiency anemia. Stool guaiacs neg, likely related to menorrhagia. Hgb 5.8, usually around 9. Transfuse 2 units PRBC. Will need iron supplementation or feraheme at some point. 3.) Menorrhagia: likely secondary to underlying fibroids. Patient states that hysterectomy has been mentioned in the past, may be next best step given degree of patient's anemia and duration of menses. Will obtain pelvic US in AM and consult GYN. No active bleeding. 4.) Left arm and facial numbness. Was brief, head CT negative. Patient low risk for CVA, probably related to her anemia. Will monitor for now, I don't think further work-up is warranted at this time. 4.) Hx of  hyperthyroidism: check a TSH 5.) DVT prophylaxis: SCDs. Will avoid pharmacologic prophylaxis given anemia, recent bleeding.  Nabeel Gladson A 05/24/2012, 1:56 AM

## 2012-05-24 NOTE — Progress Notes (Signed)
Pt has temp prior to transfusion and MD is aware and orders to continue with transfusion. Pt received benadryl premed. Will cont to monitor.

## 2012-05-24 NOTE — Progress Notes (Addendum)
[facility] PROGRESS NOTE  April Gonzalez WUJ:[id number] DOB: [birth year] DOA: 05/23/2012 PCP: No primary provider on file.  Assessment/Plan: Acute febrile illness due influenza A and H1N1  Continue on tamiflu in the ER. Influenza PCR positive influenza A and H1N1. Continue levofloxacin for possible bronchitis. Blood culture x2 drawn and no growth to date.  Acute blood loss anemia on top of underlying chronic iron deficiency anemia Stool guaiacs negative, likely related to menorrhagia. Hgb 5.8, usually around 9. Transfuse 2 units PRBC on 05/24/2012. Will start iron supplementation.   Menorrhagia Likely secondary to underlying fibroids. Discussed with Dr. Hinda Kehr [phone]) who recommended starting the patient on megace 40 mg daily and requested pelvic ultrasound which as already been ordered. He will arrange for necessary followup after discharge.   Left arm and facial numbness Was brief, head CT negative. Patient low risk for CVA, probably related to her anemia and cough. Continue to monitor, no further recurrent symptoms.   Hx of hyperthyroidism TSH and Free T4 pending.  Mild abdominal pain Likely due to upper respiratory illness. Continue to monitor for now. If significant nausea or abdominal discomfort consider discontinuing tamiflu if influenza PCR is negative.  Prophylaxis SCDs. Avoid pharmacologic prophylaxis given anemia, recent bleeding.  No PCP Has not seen Dr. Cato Mulligan for few years now. Request care management in providing patient with resources for finding PCP.  Code Status: Full code Family Communication: Family at bedside. Disposition Plan: Pending improvement in fever and stable hemoglobin.   Consultants:  Dr. Gar Ponto, GYN on telephone.  Procedures:  None  Antibiotics:  Levofloxacin 05/24/2012 >>  Tamiflu 05/23/2012 >>  HPI/Subjective: Complaining of cough and mild abdominal pain.  Objective: Filed Vitals:   05/24/12 0455 05/24/12 0600  05/24/12 0655 05/24/12 0745  BP: 130/71 131/70 138/74 138/76  Pulse: 83 86 86 84  Temp: 100.2 F (37.9 C) 100.5 F (38.1 C) 99.5 F (37.5 C) 99.5 F (37.5 C)  TempSrc: Oral Oral Oral Oral  Resp: 20 20 20 20   Height:      Weight:      SpO2:        Intake/Output Summary (Last 24 hours) at 05/24/12 1017 Last data filed at 05/24/12 0600  Gross per 24 hour  Intake 1346.67 ml  Output      0 ml  Net 1346.67 ml   Filed Weights   05/23/12 1717 05/23/12 2250  Weight: 74.844 kg (165 lb) 75.7 kg (166 lb 14.2 oz)    Exam: Physical Exam: General: Awake, Oriented, No acute distress. HEENT: EOMI. Neck: Supple CV: S1 and S2 Lungs: Clear to ascultation bilaterally Abdomen: Soft, Nontender, Nondistended, +bowel sounds. Ext: Good pulses. Trace edema.  Data Reviewed: Basic Metabolic Panel:  Lab 05/23/12 8657  NA 133*  K 3.6  CL 99  CO2 21  GLUCOSE 119*  BUN 8  CREATININE 0.70  CALCIUM 8.9  MG --  PHOS --   Liver Function Tests: No results found for this basename: AST:5,ALT:5,ALKPHOS:5,BILITOT:5,PROT:5,ALBUMIN:5 in the last 168 hours No results found for this basename: LIPASE:5,AMYLASE:5 in the last 168 hours No results found for this basename: AMMONIA:5 in the last 168 hours CBC:  Lab 05/24/12 0939 05/23/12 1845 05/23/12 1750  WBC 6.1 -- 5.5  NEUTROABS -- -- 3.8  HGB 8.5* 5.7* 5.8*  HCT 28.5* 20.8* 21.2*  MCV 68.7* -- 64.0*  PLT PENDING -- 154   Cardiac Enzymes:  Lab 05/23/12 1750  CKTOTAL --  CKMB --  CKMBINDEX --  TROPONINI <0.30  BNP (last 3 results) No results found for this basename: PROBNP:3 in the last 8760 hours CBG: No results found for this basename: GLUCAP:5 in the last 168 hours  No results found for this or any previous visit (from the past 240 hour(s)).   Studies: Dg Chest 2 View  05/23/2012  *RADIOLOGY REPORT*  Clinical Data: Numbness  CHEST - 2 VIEW  Comparison: Chest radiograph 05/20/2009  Findings: Normal heart, mediastinal, and hilar  contours.  Normal pulmonary vascularity.  Midline trachea.  Lung volumes slightly low.  The lungs are clear.  There is no pleural effusion or pneumothorax.  Bones are unremarkable.  IMPRESSION: No acute cardiopulmonary disease.   Original Report Authenticated By: Britta Mccreedy, M.D.    Ct Head Wo Contrast  05/23/2012  *RADIOLOGY REPORT*  Clinical Data:  Dizziness, weakness, headache  CT HEAD WITHOUT CONTRAST  Technique:  Contiguous axial images were obtained from the base of the skull through the vertex without contrast  Comparison:  CT head 07/05/2009.  CTA 12/19/2009.  Findings:  The brain has a normal appearance without evidence for hemorrhage, acute infarction, hydrocephalus, or mass lesion.  There is no extra axial fluid collection.  The skull and paranasal sinuses are normal. No change from priors.  IMPRESSION: Normal CT of the head without contrast.   Original Report Authenticated By: Davonna Belling, M.D.     Scheduled Meds:   . sodium chloride  500 mL Intravenous Once  . docusate sodium  100 mg Oral BID  . levofloxacin  750 mg Oral Daily  . megestrol  40 mg Oral Daily  . sodium chloride  3 mL Intravenous Q12H   Continuous Infusions:   Principal Problem:  *Anemia Active Problems:  HYPERTHYROIDISM  Unspecified Iron Deficiency Anemia  Upper respiratory infection   Banner Huckaba A  [facility] Pager [phone]. If 7PM-7AM, please contact night-coverage at [email or web address], password [facility] 05/24/2012, 10:17 AM  LOS: 1 day

## 2012-05-25 ENCOUNTER — Inpatient Hospital Stay (HOSPITAL_COMMUNITY): Payer: BC Managed Care – PPO

## 2012-05-25 DIAGNOSIS — D509 Iron deficiency anemia, unspecified: Secondary | ICD-10-CM

## 2012-05-25 DIAGNOSIS — J069 Acute upper respiratory infection, unspecified: Secondary | ICD-10-CM

## 2012-05-25 LAB — BASIC METABOLIC PANEL
BUN: 9 mg/dL (ref 6–23)
Calcium: 8.6 mg/dL (ref 8.4–10.5)
Chloride: 101 mEq/L (ref 96–112)
Creatinine, Ser: 0.63 mg/dL (ref 0.50–1.10)
GFR calc Af Amer: >90 mL/min (ref >90)
GFR calc non Af Amer: >90 mL/min (ref >90)

## 2012-05-25 LAB — CBC
HCT: 28.8 % — ABNORMAL LOW (ref 36.0–46.0)
MCH: 20.6 pg — ABNORMAL LOW (ref 26.0–34.0)
MCHC: 30.2 g/dL (ref 30.0–36.0)
MCV: 68.1 fL — ABNORMAL LOW (ref 78.0–100.0)
Platelets: 81 10*3/uL — ABNORMAL LOW (ref 150–400)
RDW: 27 % — ABNORMAL HIGH (ref 11.5–15.5)

## 2012-05-25 LAB — TYPE AND SCREEN
ABO/RH(D): B POS
Antibody Screen: NEGATIVE
Unit division: 0

## 2012-05-25 MED ORDER — LEVOFLOXACIN 750 MG PO TABS: 750.0000 mg | ORAL_TABLET | Freq: Every day | ORAL | Status: DC

## 2012-05-25 MED ORDER — POTASSIUM CHLORIDE CRYS ER 20 MEQ PO TBCR
40.0000 meq | EXTENDED_RELEASE_TABLET | Freq: Two times a day (BID) | ORAL | Status: DC
  Administered 2012-05-25: 40 meq via ORAL
  Filled 2012-05-25: qty 2

## 2012-05-25 MED ORDER — FERROUS SULFATE 325 (65 FE) MG PO TABS: 325.0000 mg | ORAL_TABLET | Freq: Three times a day (TID) | ORAL | Status: DC

## 2012-05-25 MED ORDER — IRON DEXTRAN 50 MG/ML IJ SOLN
100.0000 mg | Freq: Once | INTRAMUSCULAR | Status: AC
  Administered 2012-05-25: 100 mg via INTRAVENOUS
  Filled 2012-05-25 (×2): qty 2

## 2012-05-25 MED ORDER — OSELTAMIVIR PHOSPHATE 75 MG PO CAPS: 75.0000 mg | ORAL_CAPSULE | Freq: Two times a day (BID) | ORAL | Status: DC

## 2012-05-25 MED ORDER — SODIUM CHLORIDE 0.9 % IV SOLN
25.0000 mg | Freq: Once | INTRAVENOUS | Status: AC
  Administered 2012-05-25: 25 mg via INTRAVENOUS
  Filled 2012-05-25: qty 0.5

## 2012-05-25 NOTE — Discharge Summary (Signed)
Physician Discharge Summary  April Gonzalez ZOX:[id number] DOB: 07-Mar-1961 DOA: 05/23/2012  PCP: No primary provider on file.  Admit date: 05/23/2012 Discharge date: 05/25/2012  Time spent: 35 minutes  Recommendations for Outpatient Follow-up:  1. FOLLOW UP with PCP will need screening colonoscopy (include homehealth, outpatient follow-up instructions, specific recommendations for PCP to follow-up on, etc.)  Discharge Diagnoses:  Principal Problem:  *Anemia Active Problems:  HYPERTHYROIDISM  Unspecified Iron Deficiency Anemia  Upper respiratory infection   Discharge Condition: stable  Diet recommendation: regular  Filed Weights   05/23/12 1717 05/23/12 2250  Weight: 74.844 kg (165 lb) 75.7 kg (166 lb 14.2 oz)    History of present illness:  52 year old African American female who presented to [facility] with complaints of cough, one episode of post-tussive emesis, and tactile fevers. The cough began on Friday 1/17 and is productive of a yellow sputum. She has chest pain with coughing only. She also reports rhinorrhea, tactile fevers, generalized weakness and myalgias, and pressure in her ears. At [facility] the patient was noted to have a hemoglobin of 5.8. Patient does have a history of menorrhagia and subsequent iron deficiency anemia. The patient still has monthly menstruation and her most recent menses began 2 weeks ago and ended on 1/17. She stated that it started off light but gradually increased in amount over the second week. She has a history of fibroids and has had an endometrial polyp in the past. Patient was transferred to [facility] for admission for her flu-like symptoms as well as for blood transfusion. Of note patient did experience a brief episode of numbness in the left side of her face and left arm following a coughing episode, but this resolved quickly. She continues to report a headache of moderate intensity. Head CT was negative.   Hospital Course:  Acute febrile illness due  influenza A and H1N1  - On tamiflu in the ER 1.19.2014. -1.24.2014. - Influenza PCR positive influenza A and H1N1.  - Levofloxacin for possible bronchitis 5 day course.  Acute blood loss anemia on top of underlying chronic iron deficiency anemia  - Stool guaiacs negative, likely related to menorrhagia. Hgb 5.8, usually around 9.  - Transfuse 2 units PRBC on 05/24/2012.  - IV iron 1.20.2014, cont oral as an outpatient.  Menorrhagia  - Likely secondary to underlying fibroids. Discussed with Dr. Hinda Kehr [phone]) who recommended starting the patient on megace 40 mg daily and requested pelvic ultrasound which as already been ordered.  - He will arrange for necessary followup after discharge.   Left arm and facial numbness  - Was brief, head CT negative. Patient low risk for CVA, probably related to her anemia and cough. Continue to monitor, no further recurrent symptoms.    Procedures:  None:  Consultations:  Follow Dr. Christin Bach.  Discharge Exam: Filed Vitals:   05/24/12 1200 05/24/12 1700 05/24/12 2000 05/25/12 0600  BP: 133/72 132/77 112/66 112/70  Pulse: 73 84 74 71  Temp: 100.6 F (38.1 C) 99.8 F (37.7 C) 98.8 F (37.1 C) 99.3 F (37.4 C)  TempSrc: Oral Oral    Resp: 18 20 16 18   Height:      Weight:      SpO2: 97% 98% 100% 96%    General: A&O x3  Cardiovascular: RRR Respiratory: Good air movement CTA B/L  Discharge Instructions  Discharge Orders    Future Orders Please Complete By Expires   Diet - low sodium heart healthy  Increase activity slowly          Medication List     As of 05/25/2012  8:43 AM    TAKE these medications         ALKA-SELTZER PLUS COLD & COUGH PO   Take 2 tablets by mouth every 6 (six) hours as needed. For cold/flu symptoms      ferrous sulfate 325 (65 FE) MG tablet   Take 1 tablet (325 mg total) by mouth 3 (three) times daily with meals.      levofloxacin 750 MG tablet   Commonly known as: LEVAQUIN   Take 1  tablet (750 mg total) by mouth daily.      oseltamivir 75 MG capsule   Commonly known as: TAMIFLU   Take 1 capsule (75 mg total) by mouth 2 (two) times daily.      THERAFLU FLU/COLD PO   Take 2 capsules by mouth every 4 (four) hours as needed. For flu/cold symptoms          The results of significant diagnostics from this hospitalization (including imaging, microbiology, ancillary and laboratory) are listed below for reference.    Significant Diagnostic Studies: Dg Chest 2 View  05/23/2012  *RADIOLOGY REPORT*  Clinical Data: Numbness  CHEST - 2 VIEW  Comparison: Chest radiograph 05/20/2009  Findings: Normal heart, mediastinal, and hilar contours.  Normal pulmonary vascularity.  Midline trachea.  Lung volumes slightly low.  The lungs are clear.  There is no pleural effusion or pneumothorax.  Bones are unremarkable.  IMPRESSION: No acute cardiopulmonary disease.   Original Report Authenticated By: Britta Mccreedy, M.D.    Ct Head Wo Contrast  05/23/2012  *RADIOLOGY REPORT*  Clinical Data:  Dizziness, weakness, headache  CT HEAD WITHOUT CONTRAST  Technique:  Contiguous axial images were obtained from the base of the skull through the vertex without contrast  Comparison:  CT head 07/05/2009.  CTA 12/19/2009.  Findings:  The brain has a normal appearance without evidence for hemorrhage, acute infarction, hydrocephalus, or mass lesion.  There is no extra axial fluid collection.  The skull and paranasal sinuses are normal. No change from priors.  IMPRESSION: Normal CT of the head without contrast.   Original Report Authenticated By: Davonna Belling, M.D.     Microbiology: Recent Results (from the past 240 hour(s))  CULTURE, BLOOD (ROUTINE X 2)     Status: Normal (Preliminary result)   Collection Time   05/24/12 12:20 AM      Component Value Range Status Comment   Specimen Description BLOOD RIGHT ARM   Final    Special Requests BOTTLES DRAWN AEROBIC ONLY 10CC   Final    Culture  Setup Time 05/24/2012  12:13   Final    Culture     Final    Value:        BLOOD CULTURE RECEIVED NO GROWTH TO DATE CULTURE WILL BE HELD FOR 5 DAYS BEFORE ISSUING A FINAL NEGATIVE REPORT   Report Status PENDING   Incomplete   CULTURE, BLOOD (ROUTINE X 2)     Status: Normal (Preliminary result)   Collection Time   05/24/12 12:30 AM      Component Value Range Status Comment   Specimen Description BLOOD LEFT HAND   Final    Special Requests BOTTLES DRAWN AEROBIC ONLY 10CC   Final    Culture  Setup Time 05/24/2012 12:13   Final    Culture     Final    Value:  BLOOD CULTURE RECEIVED NO GROWTH TO DATE CULTURE WILL BE HELD FOR 5 DAYS BEFORE ISSUING A FINAL NEGATIVE REPORT   Report Status PENDING   Incomplete      Labs: Basic Metabolic Panel:  Lab 05/25/12 8119 05/24/12 0939 05/23/12 1750  NA 135 133* 133*  K 3.4* 3.5 3.6  CL 101 99 99  CO2 23 22 21   GLUCOSE 107* 126* 119*  BUN 9 10 8   CREATININE 0.63 0.76 0.70  CALCIUM 8.6 8.9 8.9  MG -- 2.1 --  PHOS -- 2.7 --   Liver Function Tests:  Lab 05/24/12 0939  AST 19  ALT 10  ALKPHOS 78  BILITOT 1.4*  PROT 7.4  ALBUMIN 3.4*   No results found for this basename: LIPASE:5,AMYLASE:5 in the last 168 hours No results found for this basename: AMMONIA:5 in the last 168 hours CBC:  Lab 05/25/12 0603 05/24/12 0939 05/23/12 1845 05/23/12 1750  WBC 4.1 6.1 -- 5.5  NEUTROABS -- -- -- 3.8  HGB 8.7* 8.5* 5.7* 5.8*  HCT 28.8* 28.5* 20.8* 21.2*  MCV 68.1* 68.7* -- 64.0*  PLT 81* 111* -- 154   Cardiac Enzymes:  Lab 05/23/12 1750  CKTOTAL --  CKMB --  CKMBINDEX --  TROPONINI <0.30   BNP: BNP (last 3 results) No results found for this basename: PROBNP:3 in the last 8760 hours CBG: No results found for this basename: GLUCAP:5 in the last 168 hours   Signed:  Marinda Elk  [facility] 05/25/2012, 8:43 AM

## 2012-05-25 NOTE — Progress Notes (Addendum)
Pt discharged to home per MD order. Pt received and reviewed all discharge instructions and medication information including follow-up appointments and prescriptions. Pt verbalized understanding.  Pt alert and oriented at discharge with no complaints.   Pt ambualted to private vehicle per patient request. Efraim Kaufmann

## 2012-05-26 NOTE — Progress Notes (Signed)
Utilization review completed.  

## 2012-05-26 NOTE — Care Management Note (Signed)
    Page 1 of 1   05/26/2012     2:44:18 PM   CARE MANAGEMENT NOTE 05/26/2012  Patient:  April Gonzalez, April Gonzalez   Account Number:  [id number]  Date Initiated:  05/26/2012  Documentation initiated by:  GRAVES-BIGELOW,Shareka Casale  Subjective/Objective Assessment:   Pt admitted with anemia.     Action/Plan:   Pt was d/c before CM was able to se pt and pt needed inforamtion about PCP. Received vm.   Anticipated DC Date:  05/25/2012   Anticipated DC Plan:  HOME/SELF CARE      DC Planning Services  CM consult      Choice offered to / List presented to:             Status of service:  Completed, signed off Medicare Important Message given?   (If response is "NO", the following Medicare IM given date fields will be blank) Date Medicare IM given:   Date Additional Medicare IM given:    Discharge Disposition:  HOME/SELF CARE  Per UR Regulation:  Reviewed for med. necessity/level of care/duration of stay  If discussed at Long Length of Stay Meetings, dates discussed:    Comments:

## 2012-05-30 LAB — CULTURE, BLOOD (ROUTINE X 2)
Culture: NO GROWTH
Culture: NO GROWTH

## 2012-06-30 ENCOUNTER — Encounter: Payer: Self-pay | Admitting: Obstetrics and Gynecology

## 2012-06-30 ENCOUNTER — Ambulatory Visit: Payer: BC Managed Care – PPO | Admitting: Obstetrics and Gynecology

## 2012-06-30 VITALS — BP 112/78 | Ht 64.0 in | Wt 165.0 lb

## 2012-06-30 DIAGNOSIS — Z124 Encounter for screening for malignant neoplasm of cervix: Secondary | ICD-10-CM

## 2012-06-30 DIAGNOSIS — E119 Type 2 diabetes mellitus without complications: Secondary | ICD-10-CM | POA: Insufficient documentation

## 2012-06-30 DIAGNOSIS — Z01419 Encounter for gynecological examination (general) (routine) without abnormal findings: Secondary | ICD-10-CM

## 2012-06-30 DIAGNOSIS — E079 Disorder of thyroid, unspecified: Secondary | ICD-10-CM | POA: Insufficient documentation

## 2012-06-30 DIAGNOSIS — R569 Unspecified convulsions: Secondary | ICD-10-CM

## 2012-06-30 DIAGNOSIS — D219 Benign neoplasm of connective and other soft tissue, unspecified: Secondary | ICD-10-CM | POA: Insufficient documentation

## 2012-06-30 DIAGNOSIS — Z8742 Personal history of other diseases of the female genital tract: Secondary | ICD-10-CM | POA: Insufficient documentation

## 2012-06-30 DIAGNOSIS — M069 Rheumatoid arthritis, unspecified: Secondary | ICD-10-CM

## 2012-06-30 DIAGNOSIS — N6310 Unspecified lump in the right breast, unspecified quadrant: Secondary | ICD-10-CM

## 2012-06-30 LAB — HM PAP SMEAR

## 2012-06-30 NOTE — Progress Notes (Signed)
NEW PT AEX  Last Pap: pt unsure  WNL: Yes Regular Periods:yes Contraception: BTL   Monthly Breast exam:no Tetanus<65yrs:yes Nl.Bladder Function:yes Daily BMs:yes Healthy Diet:yes Calcium:yes Mammogram:yes Date of Mammogram: 2-3 years per pt  Exercise:yes Have often Exercise: walking  Seatbelt: yes Abuse at home: no Stressful work:no Sigmoid-colonoscopy: pt stated 8 or 9 years ago  PCP: pt doesn't have one  Pt is here today for a AEX . Pt stated she wants herpes screening done today at her visit due to one provider told her she was positive and another told her she was neg .  Subjective:    April Gonzalez is a 52 y.o. female, G2P2002, who presents for an annual exam. She was hospitalized for several days in January with flu and was noted to have fibroids on exam and a severe anemia requiring transfusion.   She has a hx of fibroids and has had a myomx 10 years ago.  She noted uterine prolapse 2 years ago that has worsened over time and is at its worst with exercise.  She has no pelvic pressure at other times.  She c/o sui with exercise as well.  No generalized abdominal pain. She has known about a thyroid disorder for at least 3 years and was advised to have I131, but declined.  Denies temp intolerance or fatigue, but c/o neck tenderness on palpation    History   Social History  . Marital Status: Married    Spouse Name: N/A    Number of Children: N/A  . Years of Education: N/A   Social History Main Topics  . Smoking status: Never Smoker   . Smokeless tobacco: Never Used  . Alcohol Use: No  . Drug Use: No  . Sexually Active: Yes    Birth Control/ Protection: Surgical     Comment: BTL    Other Topics Concern  . None   Social History Narrative  . None    Menstrual cycle:   LMP: Patient's last menstrual period was 05/22/2012.           Cycle: q21 days with 2nd and 3rd days very heavy, total 5-6 days.  No IM bleeding.  No cramps  The following portions of the  patient's history were reviewed and updated as appropriate: allergies, current medications, past family history, past medical history, past social history, past surgical history and problem list.  Review of Systems Pertinent items are noted in HPI. Breast:Negative for breast lump,nipple discharge or nipple retraction Gastrointestinal: Negative for abdominal pain, change in bowel habits or rectal bleeding    Objective:    BP 112/78  Ht 5\' 4"  (1.626 m)  Wt 165 lb (74.844 kg)  BMI 28.31 kg/m2  LMP 05/22/2012    Weight:  Wt Readings from Last 1 Encounters:  06/30/12 165 lb (74.844 kg)          BMI: Body mass index is 28.31 kg/(m^2).  General Appearance: Alert, appropriate appearance for age. No acute distress HEENT: Grossly normal Neck / Thyroid: Supple, no masses, nodes or enlargement Lungs: clear to auscultation bilaterally Back: No CVA tenderness Breast Exam: right breast with 15 mm mobile nontender mass at 8 o'clock Cardiovascular: Regular rate and rhythm. S1, S2, no murmur Gastrointestinal: Soft, non-tender, no masses or organomegaly Pelvic Exam: External genitalia: normal general appearance Urinary system: effaced uv angle.  cystocele to with in 5 cm of intoitus Vaginal: cystocele present, no  rectocele present,  Cervix: normal appearance and prolapses to introitus Adnexa: no masses  Uterus: irregular enlargement and 12-14 weeks size Rectovaginal: normal rectal, no masses Lymphatic Exam: Non-palpable nodes in neck, clavicular, axillary, or inguinal regions Skin: no rash or abnormalities Neurologic: Normal gait and speech, no tremor  Psychiatric: Alert and oriented, appropriate affect.   Wet Prep:not applicable Urinalysis:not applicable UPT: Not done   Assessment:    Right breast mass  Symptomatic uterine prolapse Fibroids  Menorrhagia  Anemia  Possible connective tissue disorder with ? RA Thyroid disease, possible burned out Graves dz ?hx HSV   Plan:     mammogram pap smear additional lab tests per orders STD screening: done Contraception:bilateral tubal ligation Primary care MD f/u Discussed hysterectomy as option for prolapse. Urodynamic testing prior to surgery to consider SUI procedure

## 2012-07-01 ENCOUNTER — Telehealth: Payer: Self-pay

## 2012-07-01 LAB — HSV 2 ANTIBODY, IGG: HSV 2 Glycoprotein G Ab, IgG: 6.6 IV — ABNORMAL HIGH

## 2012-07-01 LAB — HSV 1 ANTIBODY, IGG: HSV 1 Glycoprotein G Ab, IgG: 6.78 IV — ABNORMAL HIGH

## 2012-07-01 NOTE — Telephone Encounter (Signed)
Tc from pt. Pt aware of PCP referral 08/04/12 @ 10:30a.  Fu appt with [facility] sched 07/28/12 @ 4:00p. Pt voices understanding.

## 2012-07-01 NOTE — Telephone Encounter (Signed)
Message copied by Raylene Everts on Wed Jul 01, 2012  9:36 AM ------      Message from: Hal Morales      Created: Tue Jun 30, 2012 10:49 AM      Regarding: Referral [place] [facility] ASAP 1st avail       Dx:      Thyromegaly      Rheumatoid arthritis      Anemia      Fibroids        ------

## 2012-07-01 NOTE — Telephone Encounter (Signed)
Pc to [place] for referral. Appt sched 08/04/12 @ 10:30p with Nicki Reaper. Lm on vm for pt to cb to make aware.

## 2012-07-02 LAB — PAP IG, CT-NG NAA, HPV HIGH-RISK: GC Probe Amp: NEGATIVE

## 2012-07-03 ENCOUNTER — Other Ambulatory Visit: Payer: Self-pay

## 2012-07-03 MED ORDER — VALACYCLOVIR HCL 500 MG PO TABS: ORAL_TABLET | ORAL | Status: DC

## 2012-07-09 ENCOUNTER — Ambulatory Visit
Admission: RE | Admit: 2012-07-09 | Discharge: 2012-07-09 | Disposition: A | Discharge status: Home / Self Care | Payer: BC Managed Care – PPO | Source: Ambulatory Visit | Attending: Obstetrics and Gynecology | Admitting: Obstetrics and Gynecology

## 2012-07-09 ENCOUNTER — Other Ambulatory Visit: Payer: Self-pay | Admitting: Obstetrics and Gynecology

## 2012-07-09 DIAGNOSIS — N6310 Unspecified lump in the right breast, unspecified quadrant: Secondary | ICD-10-CM

## 2012-07-13 ENCOUNTER — Encounter: Payer: Self-pay | Admitting: Obstetrics and Gynecology

## 2012-07-13 DIAGNOSIS — R922 Inconclusive mammogram: Secondary | ICD-10-CM | POA: Insufficient documentation

## 2012-08-04 ENCOUNTER — Encounter: Payer: Self-pay | Admitting: Internal Medicine

## 2012-08-04 ENCOUNTER — Ambulatory Visit (INDEPENDENT_AMBULATORY_CARE_PROVIDER_SITE_OTHER): Payer: BC Managed Care – PPO | Admitting: Internal Medicine

## 2012-08-04 ENCOUNTER — Other Ambulatory Visit (INDEPENDENT_AMBULATORY_CARE_PROVIDER_SITE_OTHER): Payer: BC Managed Care – PPO

## 2012-08-04 VITALS — BP 128/78 | HR 75 | Temp 97.6°F | Ht 63.5 in | Wt 169.0 lb

## 2012-08-04 DIAGNOSIS — Z0000 Encounter for general adult medical examination without abnormal findings: Secondary | ICD-10-CM

## 2012-08-04 DIAGNOSIS — Z13220 Encounter for screening for lipoid disorders: Secondary | ICD-10-CM

## 2012-08-04 DIAGNOSIS — Z1329 Encounter for screening for other suspected endocrine disorder: Secondary | ICD-10-CM

## 2012-08-04 DIAGNOSIS — Z130 Encounter for screening for diseases of the blood and blood-forming organs and certain disorders involving the immune mechanism: Secondary | ICD-10-CM

## 2012-08-04 DIAGNOSIS — Z131 Encounter for screening for diabetes mellitus: Secondary | ICD-10-CM

## 2012-08-04 LAB — HEMOGLOBIN A1C: Hgb A1c MFr Bld: 5.5 % (ref 4.6–6.5)

## 2012-08-04 LAB — CBC
Platelets: 361 10*3/uL (ref 150.0–400.0)
RBC: 3.87 Mil/uL (ref 3.87–5.11)
WBC: 5.9 10*3/uL (ref 4.5–10.5)

## 2012-08-04 LAB — LIPID PANEL
HDL: 40.3 mg/dL (ref >39.00)
Total CHOL/HDL Ratio: 5
VLDL: 29.6 mg/dL (ref 0.0–40.0)

## 2012-08-04 LAB — LDL CHOLESTEROL, DIRECT: Direct LDL: 140.7 mg/dL

## 2012-08-04 LAB — BASIC METABOLIC PANEL
BUN: 9 mg/dL (ref 6–23)
CO2: 25 mEq/L (ref 19–32)
Chloride: 104 mEq/L (ref 96–112)
Glucose, Bld: 88 mg/dL (ref 70–99)
Potassium: 4.5 mEq/L (ref 3.5–5.1)
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
## 2012-08-04 NOTE — Patient Instructions (Signed)

## 2012-08-05 ENCOUNTER — Other Ambulatory Visit: Payer: Self-pay | Admitting: Internal Medicine

## 2012-08-05 DIAGNOSIS — E010 Iodine-deficiency related diffuse (endemic) goiter: Secondary | ICD-10-CM

## 2012-08-07 ENCOUNTER — Ambulatory Visit (INDEPENDENT_AMBULATORY_CARE_PROVIDER_SITE_OTHER): Payer: BC Managed Care – PPO | Admitting: Internal Medicine

## 2012-08-07 ENCOUNTER — Telehealth: Payer: Self-pay | Admitting: Internal Medicine

## 2012-08-07 ENCOUNTER — Encounter: Payer: Self-pay | Admitting: Internal Medicine

## 2012-08-07 VITALS — BP 132/82 | HR 65 | Temp 97.0°F

## 2012-08-07 DIAGNOSIS — M62830 Muscle spasm of back: Secondary | ICD-10-CM

## 2012-08-07 DIAGNOSIS — M5380 Other specified dorsopathies, site unspecified: Secondary | ICD-10-CM

## 2012-08-07 DIAGNOSIS — R079 Chest pain, unspecified: Secondary | ICD-10-CM

## 2012-08-07 MED ORDER — KETOROLAC TROMETHAMINE 10 MG PO TABS: 10.0000 mg | ORAL_TABLET | Freq: Four times a day (QID) | ORAL | Status: DC | PRN

## 2012-08-07 MED ORDER — DIAZEPAM 5 MG PO TABS: 5.0000 mg | ORAL_TABLET | Freq: Two times a day (BID) | ORAL | Status: DC | PRN

## 2012-08-07 NOTE — Progress Notes (Signed)
Subjective:    Patient ID: April Gonzalez, female    DOB: 11-02-1960, 52 y.o.   MRN: [id number]  HPI  Pt presents to the clinic today with c/o pain down the right side of her body. The pain starts in her right back, causing jerky muscle movements and goes across to her left chest. She denies chest tightness or shortness of breath. She has had seizures as a child but reports this is different. She is not having any difficulty with memory or level of consciousness. She reports that it feels like her muscles are spasiming. This started this morning. It is very painful. She has not taken anything for it and has never had spasms like this in the past. She has blood worked checked 3 days ago and electrolytes were fine.   Review of Systems      Past Medical History  Diagnosis Date  . Seizures     last seizure age 34 or 10 per pt report  . RA (rheumatoid arthritis)     states was dx at age 36 but not on meds  . Diabetes mellitus without complication     pt is unsure ?   . H/O uterine prolapse   . Thyroid disorder   . Fibroids     H/O myomectomy  . Anemia 05/2012    transfusion  . Asthma   . Heart murmur   . History of blood transfusion     Current Outpatient Prescriptions  Medication Sig Dispense Refill  . ferrous sulfate 325 (65 FE) MG tablet Take 1 tablet (325 mg total) by mouth 3 (three) times daily with meals.  90 tablet  0  . diazepam (VALIUM) 5 MG tablet Take 1 tablet (5 mg total) by mouth every 12 (twelve) hours as needed for anxiety.  30 tablet  0  . ketorolac (TORADOL) 10 MG tablet Take 1 tablet (10 mg total) by mouth every 6 (six) hours as needed for pain.  20 tablet  0   No current facility-administered medications for this visit.    Allergies  Allergen Reactions  . Other     Seasonal / dog hair   . Methimazole     REACTION: anaphylaxis  . Nitroglycerin     REACTION: sweating  . Tetracycline Hcl     REACTION: elevated blood pressure    Family History  Problem  Relation Age of Onset  . Heart failure Father   . Diabetes Mother   . High blood pressure Mother   . Arthritis Mother   . Hypertension Mother   . Breast cancer Maternal Aunt     in her 51's   . Breast cancer Cousin     History   Social History  . Marital Status: Married    Spouse Name: N/A    Number of Children: 2  . Years of Education: 15   Occupational History  . Not on file.   Social History Main Topics  . Smoking status: Never Smoker   . Smokeless tobacco: Never Used  . Alcohol Use: No  . Drug Use: No  . Sexually Active: Yes    Birth Control/ Protection: Surgical     Comment: BTL    Other Topics Concern  . Not on file   Social History Narrative   Regular exercise-no   Caffeine Use-yes     Constitutional: Denies fever, malaise, fatigue, headache or abrupt weight changes.  Cardiovascular: Pt reports pain in left chest. Denies chest tightness, palpitations or swelling  in the hands or feet.  Musculoskeletal: Pt reports muscle spasm. Denies decrease in range of motion, difficulty with gait, muscle pain or joint pain and swelling.  Neurological: Denies dizziness, difficulty with memory, difficulty with speech or problems with balance and coordination.   No other specific complaints in a complete review of systems (except as listed in HPI above).  Objective:   Physical Exam   BP 132/82  Pulse 65  Temp(Src) 97 F (36.1 C) (Oral)  SpO2 97%  LMP 07/25/2012 Wt Readings from Last 3 Encounters:  08/04/12 169 lb (76.658 kg)  06/30/12 165 lb (74.844 kg)  05/23/12 166 lb 14.2 oz (75.7 kg)    General: Appears her stated age, well developed, well nourished in NAD. Cardiovascular: Normal rate and rhythm. S1,S2 noted.  No murmur, rubs or gallops noted. No JVD or BLE edema. No carotid bruits noted. Pulmonary/Chest: Normal effort and positive vesicular breath sounds. No respiratory distress. No wheezes, rales or ronchi noted.  Musculoskeletal: Normal range of motion.  No signs of joint swelling. No difficulty with gait. Strength 5/5 bilaterally. Spasm noted of the trapezius muscle. Neurological: Alert and oriented. Cranial nerves II-XII intact. Coordination normal. +DTRs bilaterally.       Assessment & Plan:   Muscle spasm of back, new onset:  EKG to r/o cardiac ischemia secondary to chest pain- negative Erx for Valium 5 Mg BID eRx for toradol TID prn  If any worse, go to the ER for evaluation

## 2012-08-07 NOTE — Telephone Encounter (Signed)
Patient Information:  Caller Name: [place]  Phone: [phone]  Patient: April Gonzalez  Gender: Female  DOB: January 10, 1961  Age: 52 Years  PCP: Nicki Reaper  Pregnant: No  Office Follow Up:  Does the office need to follow up with this patient?: No  Instructions For The Office: N/A  RN Note:  Patient states she developed intermittent sharp pain on the entire right side of her body from right side of chest, radiating to right side of back and down her right leg. Onset X 1 week. States she has muscle soreness of her entire right side that is constant and has become progressively worse. States muscle soreness is felt with movement. States pain is only present with movement. Denies numbness or tingling. Denies urinary sx. Patient states pain in right side of chest increases with exhalation. Denies injury. Care advice given per guidelines. Call back paramerers reviewed. Patient verbalizes understanding. Patient states she needs an appt. after 1300 08/07/12.   Symptoms  Reason For Call & Symptoms: Pain entire right side of body from chest, radiating to back and down right leg into right foot. Hx: Rheumatory Arthritis  Reviewed Health History In EMR: Yes  Reviewed Medications In EMR: Yes  Reviewed Allergies In EMR: Yes  Reviewed Surgeries / Procedures: Yes  Date of Onset of Symptoms: 07/31/2012  Treatments Tried: Marlin Canary Powder  Treatments Tried Worked: No OB / GYN:  LMP: 07/28/2012  Guideline(s) Used:  Chest Pain  Back Pain  Disposition Per Guideline:   See Today or Tomorrow in Office  Reason For Disposition Reached:   Pain radiates into the thigh or further down the leg  Advice Given:  Fleeting Chest Pain:  Fleeting chest pains that last only a few seconds and then go away are generally not serious. They may be from pinched muscles or nerves in your chest wall.  Call Back If:  Severe chest pain  Constant chest pain lasting longer than 5 minutes  Difficulty breathing  Fever  You  become worse.  Reassurance:  Twisting or heavy lifting can cause back pain.  Sleep:  Sleep on your side with a pillow between your knees. If you sleep on your back, put a pillow under your knees.  Avoid sleeping on your stomach.  Your mattress should be firm. Avoid waterbeds.  Activity  Avoid anything that makes your pain worse. Avoid heavy lifting, twisting, and too much exercise until your back heals.  Call Back If:  Numbness or weakness occur  Bowel/bladder problems occur  You become worse.  Call Back If:  You become worse.  Patient Will Follow Care Advice:  YES  Appointment Scheduled:  08/07/2012 13:45:00 Appointment Scheduled Provider:  Nicki Reaper

## 2012-08-07 NOTE — Patient Instructions (Signed)
    Muscle Cramps Muscle cramps are due to sudden involuntary muscle contraction. This means you have no control over the tightening of a muscle (or muscles). Often there are no obvious causes. Muscle cramps may occur with overexertion. They may also occur with chilling of the muscles. An example of a muscle chilling activity is swimming. It is uncommon for cramps to be due to a serious underlying disorder. In most cases, muscle cramps improve (or leave) within minutes. CAUSES   Some common causes are:  Injury.   Infections, especially viral.   Abnormal levels of the salts and ions in your blood (electrolytes). This could happen if you are taking water pills (diuretics).   Blood vessel disease where not enough blood is getting to the muscles (intermittent claudication).  Some uncommon causes are:  Side effects of some medicine (such as lithium).   Alcohol abuse.   Diseases where there is soreness (inflammation) of the muscular system.  HOME CARE INSTRUCTIONS    It may be helpful to massage, stretch, and relax the affected muscle.   Taking a dose of over-the-counter diphenhydramine is helpful for night leg cramps.  SEEK MEDICAL CARE IF:   Cramps are frequent and not relieved with medicine. MAKE SURE YOU:    Understand these instructions.   Will watch your condition.   Will get help right away if you are not doing well or get worse.  Document Released: 10/12/2001 Document Revised: 07/15/2011 Document Reviewed: 04/13/2008 Exit[employer] Patient Information 2013 [employer].    

## 2012-08-11 ENCOUNTER — Ambulatory Visit
Admission: RE | Admit: 2012-08-11 | Discharge: 2012-08-11 | Disposition: A | Discharge status: Home / Self Care | Payer: BC Managed Care – PPO | Source: Ambulatory Visit | Attending: Internal Medicine | Admitting: Internal Medicine

## 2012-08-11 ENCOUNTER — Telehealth: Payer: Self-pay | Admitting: Internal Medicine

## 2012-08-11 DIAGNOSIS — E010 Iodine-deficiency related diffuse (endemic) goiter: Secondary | ICD-10-CM

## 2012-08-11 NOTE — Telephone Encounter (Signed)
Attempted a call back and got her voice mail, left message to return call if she still needs to talk to [place]

## 2012-08-11 NOTE — Telephone Encounter (Signed)
Pt was here for appt on 08/07/12 and stated that she still having pain around her neck area. Pt stated the med that was given to her is not working. Pt is hurt to the point that she can not move. Please call pt back. Offer appt but wants a call from the nurse or [place] first due to transportation.

## 2012-08-11 NOTE — Telephone Encounter (Signed)
Pt states that she is still having neck pain to where she cannot turn her neck. She states that it hurts to move and to breathe, she is asking for NP's advisement.

## 2012-08-12 NOTE — Telephone Encounter (Signed)
April Gonzalez, If she is having difficulty breathing due to the pain in her neck, I would advise her to go to urgent care/ER for evaluation. [name]

## 2012-08-12 NOTE — Telephone Encounter (Signed)
Pt informed of NP's advisement.  

## 2012-09-08 ENCOUNTER — Telehealth: Payer: Self-pay | Admitting: *Deleted

## 2012-09-08 DIAGNOSIS — E0590 Thyrotoxicosis, unspecified without thyrotoxic crisis or storm: Secondary | ICD-10-CM

## 2012-09-08 NOTE — Telephone Encounter (Signed)
Done. Thx.

## 2012-09-08 NOTE — Telephone Encounter (Signed)
Pt would like referral to endocrinologist for hyperthyroidism. Please advise in [place] absence.

## 2012-09-09 NOTE — Telephone Encounter (Signed)
Pt informed of referral

## 2012-09-22 ENCOUNTER — Ambulatory Visit: Payer: BC Managed Care – PPO | Admitting: Internal Medicine

## 2012-09-22 DIAGNOSIS — Z0289 Encounter for other administrative examinations: Secondary | ICD-10-CM

## 2012-11-02 ENCOUNTER — Encounter: Payer: Self-pay | Admitting: Obstetrics and Gynecology

## 2012-11-02 DIAGNOSIS — D219 Benign neoplasm of connective and other soft tissue, unspecified: Secondary | ICD-10-CM | POA: Insufficient documentation

## 2012-11-02 DIAGNOSIS — R32 Unspecified urinary incontinence: Secondary | ICD-10-CM | POA: Insufficient documentation

## 2012-11-05 ENCOUNTER — Other Ambulatory Visit: Payer: Self-pay | Admitting: Obstetrics and Gynecology

## 2012-11-24 ENCOUNTER — Other Ambulatory Visit: Payer: Self-pay | Admitting: Obstetrics and Gynecology

## 2012-11-24 ENCOUNTER — Other Ambulatory Visit (HOSPITAL_COMMUNITY): Payer: Self-pay | Admitting: Obstetrics and Gynecology
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
## 2012-12-07 ENCOUNTER — Encounter (HOSPITAL_COMMUNITY): Payer: Self-pay | Admitting: Pharmacy Technician

## 2012-12-09 ENCOUNTER — Encounter (HOSPITAL_COMMUNITY)
Admission: RE | Admit: 2012-12-09 | Discharge: 2012-12-09 | Disposition: A | Discharge status: Home / Self Care | Payer: BC Managed Care – PPO | Source: Ambulatory Visit | Attending: Obstetrics and Gynecology | Admitting: Obstetrics and Gynecology

## 2012-12-09 ENCOUNTER — Encounter (HOSPITAL_COMMUNITY): Payer: Self-pay

## 2012-12-09 ENCOUNTER — Other Ambulatory Visit: Payer: Self-pay | Admitting: Obstetrics and Gynecology

## 2012-12-09 LAB — CBC
HCT: 26.7 % — ABNORMAL LOW (ref 36.0–46.0)
MCH: 19.5 pg — ABNORMAL LOW (ref 26.0–34.0)
MCHC: 29.2 g/dL — ABNORMAL LOW (ref 30.0–36.0)
RDW: 17.8 % — ABNORMAL HIGH (ref 11.5–15.5)

## 2012-12-09 MED ORDER — DEXTROSE 5 % IV SOLN
2.0000 g | INTRAVENOUS | Status: AC
  Administered 2012-12-10: 2 g via INTRAVENOUS
  Filled 2012-12-09: qty 2

## 2012-12-09 NOTE — Pre-Procedure Instructions (Signed)
Hgb of 7.8 called to Adrianne/MD office.

## 2012-12-09 NOTE — H&P (Signed)
  I spoke with the patient by telephone concerning her hemoglobin of 7.8 today.  She had her last hgb 4 mos ago and was 9.0.  She states she is taking her iron regularly, but that she has had 3 heavy menstrual cycles within the last 2 months, each lasting about 8 days with hourly pad changes most of those days. I reviewed the probablility that she would require blood transfusion with her surgical procedure, depending on the amount of blood loss.  She had multiple unit rbc transfusion in January for severe anemia and acknowledged understanding of the risks and benfits of transfusion and gave her verbal consent. She will sign written consent on admission.

## 2012-12-09 NOTE — Patient Instructions (Addendum)
April Gonzalez  12/09/2012   Your procedure is scheduled on:  12/10/2012  Enter through the Main Entrance of [facility] at 0830 AM.  Pick up the phone at the desk and dial [phone].   Call this number if you have problems the morning of surgery: [phone]   Remember:   Do not eat food:After Midnight.  Do not drink clear liquids: After Midnight.  Take these medicines the morning of surgery with A SIP OF WATER: n/a   Do not wear jewelry, make-up or nail polish.  Do not wear lotions, powders, or perfumes. You may wear deodorant.  Do not shave 48 hours prior to surgery.  Do not bring valuables to the hospital.  [facility] is not responsible                  for any belongings or valuables brought to the hospital.  Contacts, dentures or bridgework may not be worn into surgery.  Leave suitcase in the car. After surgery it may be brought to your room.  For patients admitted to the hospital, checkout time is 11:00 AM the day of                discharge.   Patients discharged the day of surgery will not be allowed to drive                   home.  Name and phone number of your driver: undecided  Special Instructions: Shower using CHG 2 nights before surgery and the night before surgery.  If you shower the day of surgery use CHG.  Use special wash - you have one bottle of CHG for all showers.  You should use approximately 1/3 of the bottle for each shower.   Please read over the following fact sheets that you were given: Surgical Site Infection Prevention

## 2012-12-10 ENCOUNTER — Encounter (HOSPITAL_COMMUNITY): Payer: Self-pay | Admitting: Anesthesiology

## 2012-12-10 ENCOUNTER — Encounter (HOSPITAL_COMMUNITY)
Admission: RE | Disposition: A | Discharge status: Home / Self Care | Payer: Self-pay | Source: Ambulatory Visit | Attending: Obstetrics and Gynecology

## 2012-12-10 ENCOUNTER — Ambulatory Visit (HOSPITAL_COMMUNITY)
Admission: RE | Admit: 2012-12-10 | Discharge: 2012-12-11 | Disposition: A | Discharge status: Home / Self Care | Payer: BC Managed Care – PPO | Source: Ambulatory Visit | Attending: Obstetrics and Gynecology | Admitting: Obstetrics and Gynecology

## 2012-12-10 ENCOUNTER — Ambulatory Visit (HOSPITAL_COMMUNITY): Payer: BC Managed Care – PPO | Admitting: Anesthesiology

## 2012-12-10 DIAGNOSIS — N938 Other specified abnormal uterine and vaginal bleeding: Secondary | ICD-10-CM | POA: Insufficient documentation

## 2012-12-10 DIAGNOSIS — R51 Headache: Secondary | ICD-10-CM | POA: Insufficient documentation

## 2012-12-10 DIAGNOSIS — D251 Intramural leiomyoma of uterus: Secondary | ICD-10-CM | POA: Insufficient documentation

## 2012-12-10 DIAGNOSIS — N8000 Endometriosis of the uterus, unspecified: Secondary | ICD-10-CM | POA: Insufficient documentation

## 2012-12-10 DIAGNOSIS — D252 Subserosal leiomyoma of uterus: Secondary | ICD-10-CM | POA: Insufficient documentation

## 2012-12-10 DIAGNOSIS — N393 Stress incontinence (female) (male): Secondary | ICD-10-CM | POA: Insufficient documentation

## 2012-12-10 DIAGNOSIS — N814 Uterovaginal prolapse, unspecified: Secondary | ICD-10-CM | POA: Insufficient documentation

## 2012-12-10 DIAGNOSIS — N949 Unspecified condition associated with female genital organs and menstrual cycle: Secondary | ICD-10-CM | POA: Insufficient documentation

## 2012-12-10 DIAGNOSIS — N880 Leukoplakia of cervix uteri: Secondary | ICD-10-CM | POA: Insufficient documentation

## 2012-12-10 HISTORY — PX: CYSTOSCOPY: SHX5120

## 2012-12-10 HISTORY — PX: BLADDER SUSPENSION: SHX72

## 2012-12-10 HISTORY — PX: VAGINAL HYSTERECTOMY: SHX2639

## 2012-12-10 HISTORY — PX: CYSTOCELE REPAIR: SHX163

## 2012-12-10 LAB — GLUCOSE, CAPILLARY

## 2012-12-10 SURGERY — HYSTERECTOMY, VAGINAL
Anesthesia: General | Site: Vagina | Wound class: Clean Contaminated

## 2012-12-10 MED ORDER — KETOROLAC TROMETHAMINE 30 MG/ML IJ SOLN
INTRAMUSCULAR | Status: AC
  Filled 2012-12-10: qty 1

## 2012-12-10 MED ORDER — GLYCOPYRROLATE 0.2 MG/ML IJ SOLN
INTRAMUSCULAR | Status: AC
  Filled 2012-12-10: qty 2

## 2012-12-10 MED ORDER — GLYCOPYRROLATE 0.2 MG/ML IJ SOLN
INTRAMUSCULAR | Status: AC
  Filled 2012-12-10: qty 1

## 2012-12-10 MED ORDER — ROCURONIUM BROMIDE 50 MG/5ML IV SOLN
INTRAVENOUS | Status: AC
  Filled 2012-12-10: qty 1

## 2012-12-10 MED ORDER — LIDOCAINE HCL (CARDIAC) 20 MG/ML IV SOLN
INTRAVENOUS | Status: DC | PRN
  Administered 2012-12-10: 80 mg via INTRAVENOUS

## 2012-12-10 MED ORDER — FENTANYL CITRATE 0.05 MG/ML IJ SOLN
INTRAMUSCULAR | Status: DC | PRN
  Administered 2012-12-10: 50 ug via INTRAVENOUS
  Administered 2012-12-10 (×2): 100 ug via INTRAVENOUS
  Administered 2012-12-10: 50 ug via INTRAVENOUS
  Administered 2012-12-10 (×3): 100 ug via INTRAVENOUS

## 2012-12-10 MED ORDER — PROPOFOL 10 MG/ML IV BOLUS
INTRAVENOUS | Status: DC | PRN
  Administered 2012-12-10: 180 mg via INTRAVENOUS

## 2012-12-10 MED ORDER — 0.9 % SODIUM CHLORIDE (POUR BTL) OPTIME
TOPICAL | Status: DC | PRN
  Administered 2012-12-10: 1000 mL

## 2012-12-10 MED ORDER — OXYCODONE-ACETAMINOPHEN 5-325 MG PO TABS
1.0000 | ORAL_TABLET | ORAL | Status: DC | PRN
  Administered 2012-12-11: 2 via ORAL
  Administered 2012-12-11: 1 via ORAL
  Filled 2012-12-10: qty 2
  Filled 2012-12-10: qty 1

## 2012-12-10 MED ORDER — ROCURONIUM BROMIDE 100 MG/10ML IV SOLN
INTRAVENOUS | Status: DC | PRN
  Administered 2012-12-10: 10 mg via INTRAVENOUS
  Administered 2012-12-10: 50 mg via INTRAVENOUS
  Administered 2012-12-10: 10 mg via INTRAVENOUS
  Administered 2012-12-10: 20 mg via INTRAVENOUS

## 2012-12-10 MED ORDER — VASOPRESSIN 20 UNIT/ML IJ SOLN
INTRAVENOUS | Status: DC | PRN
  Administered 2012-12-10: 13:00:00 via INTRAMUSCULAR

## 2012-12-10 MED ORDER — KETOROLAC TROMETHAMINE 30 MG/ML IJ SOLN
30.0000 mg | Freq: Four times a day (QID) | INTRAMUSCULAR | Status: AC
  Administered 2012-12-10 – 2012-12-11 (×2): 30 mg via INTRAVENOUS
  Filled 2012-12-10 (×2): qty 1

## 2012-12-10 MED ORDER — EPINEPHRINE HCL (NASAL) 0.1 % NA SOLN
NASAL | Status: AC
  Filled 2012-12-10: qty 30

## 2012-12-10 MED ORDER — HYDROMORPHONE HCL PF 1 MG/ML IJ SOLN: 0.2500 mg | INTRAMUSCULAR | Status: DC | PRN

## 2012-12-10 MED ORDER — HYDROMORPHONE HCL PF 1 MG/ML IJ SOLN
INTRAMUSCULAR | Status: AC
  Filled 2012-12-10: qty 1

## 2012-12-10 MED ORDER — MIDAZOLAM HCL 5 MG/5ML IJ SOLN
INTRAMUSCULAR | Status: DC | PRN
  Administered 2012-12-10: 2 mg via INTRAVENOUS

## 2012-12-10 MED ORDER — ONDANSETRON HCL 4 MG PO TABS: 4.0000 mg | ORAL_TABLET | Freq: Four times a day (QID) | ORAL | Status: DC | PRN

## 2012-12-10 MED ORDER — MENTHOL 3 MG MT LOZG
1.0000 | LOZENGE | OROMUCOSAL | Status: DC | PRN
  Administered 2012-12-10: 3 mg via ORAL
  Filled 2012-12-10: qty 9

## 2012-12-10 MED ORDER — KETOROLAC TROMETHAMINE 30 MG/ML IJ SOLN: 30.0000 mg | Freq: Four times a day (QID) | INTRAMUSCULAR | Status: AC

## 2012-12-10 MED ORDER — HYDROMORPHONE HCL PF 1 MG/ML IJ SOLN
INTRAMUSCULAR | Status: DC | PRN
  Administered 2012-12-10 (×2): 1 mg via INTRAVENOUS

## 2012-12-10 MED ORDER — ONDANSETRON HCL 4 MG/2ML IJ SOLN
INTRAMUSCULAR | Status: AC
  Filled 2012-12-10: qty 2

## 2012-12-10 MED ORDER — DIPHENHYDRAMINE HCL 50 MG/ML IJ SOLN: 12.5000 mg | Freq: Four times a day (QID) | INTRAMUSCULAR | Status: DC | PRN

## 2012-12-10 MED ORDER — PROPOFOL 10 MG/ML IV EMUL
INTRAVENOUS | Status: AC
  Filled 2012-12-10: qty 20

## 2012-12-10 MED ORDER — ONDANSETRON HCL 4 MG/2ML IJ SOLN
INTRAMUSCULAR | Status: DC | PRN
  Administered 2012-12-10: 4 mg via INTRAVENOUS

## 2012-12-10 MED ORDER — NEOSTIGMINE METHYLSULFATE 1 MG/ML IJ SOLN
INTRAMUSCULAR | Status: AC
  Filled 2012-12-10: qty 1

## 2012-12-10 MED ORDER — LIDOCAINE HCL (CARDIAC) 20 MG/ML IV SOLN
INTRAVENOUS | Status: AC
  Filled 2012-12-10: qty 5

## 2012-12-10 MED ORDER — NEOSTIGMINE METHYLSULFATE 1 MG/ML IJ SOLN
INTRAMUSCULAR | Status: DC | PRN
  Administered 2012-12-10: 2 mg via INTRAVENOUS

## 2012-12-10 MED ORDER — HYDROMORPHONE 0.3 MG/ML IV SOLN
INTRAVENOUS | Status: DC
  Administered 2012-12-10: 0.6 mg via INTRAVENOUS
  Administered 2012-12-10: 17:00:00 via INTRAVENOUS
  Administered 2012-12-10: 0.9 mg via INTRAVENOUS
  Filled 2012-12-10: qty 25

## 2012-12-10 MED ORDER — INDIGOTINDISULFONATE SODIUM 8 MG/ML IJ SOLN
INTRAMUSCULAR | Status: AC
  Filled 2012-12-10: qty 5

## 2012-12-10 MED ORDER — DEXAMETHASONE SODIUM PHOSPHATE 10 MG/ML IJ SOLN
INTRAMUSCULAR | Status: DC | PRN
  Administered 2012-12-10: 10 mg via INTRAVENOUS

## 2012-12-10 MED ORDER — INDIGOTINDISULFONATE SODIUM 8 MG/ML IJ SOLN
INTRAMUSCULAR | Status: DC | PRN
  Administered 2012-12-10: 5 mL via INTRAVENOUS

## 2012-12-10 MED ORDER — STERILE WATER FOR IRRIGATION IR SOLN
Status: DC | PRN
  Administered 2012-12-10: 1000 mL

## 2012-12-10 MED ORDER — SODIUM CHLORIDE 0.9 % IJ SOLN: 9.0000 mL | INTRAMUSCULAR | Status: DC | PRN

## 2012-12-10 MED ORDER — LACTATED RINGERS IV SOLN
INTRAVENOUS | Status: DC
  Administered 2012-12-11: 03:00:00 via INTRAVENOUS

## 2012-12-10 MED ORDER — ONDANSETRON HCL 4 MG/2ML IJ SOLN
4.0000 mg | Freq: Four times a day (QID) | INTRAMUSCULAR | Status: DC | PRN
  Administered 2012-12-10 – 2012-12-11 (×2): 4 mg via INTRAVENOUS
  Filled 2012-12-10 (×2): qty 2

## 2012-12-10 MED ORDER — NALOXONE HCL 0.4 MG/ML IJ SOLN: 0.4000 mg | INTRAMUSCULAR | Status: DC | PRN

## 2012-12-10 MED ORDER — FENTANYL CITRATE 0.05 MG/ML IJ SOLN
INTRAMUSCULAR | Status: AC
  Filled 2012-12-10: qty 5

## 2012-12-10 MED ORDER — SIMETHICONE 80 MG PO CHEW: 80.0000 mg | CHEWABLE_TABLET | Freq: Four times a day (QID) | ORAL | Status: DC | PRN

## 2012-12-10 MED ORDER — DIPHENHYDRAMINE HCL 12.5 MG/5ML PO ELIX: 12.5000 mg | ORAL_SOLUTION | Freq: Four times a day (QID) | ORAL | Status: DC | PRN

## 2012-12-10 MED ORDER — LACTATED RINGERS IV SOLN
INTRAVENOUS | Status: DC
  Administered 2012-12-10 (×4): via INTRAVENOUS

## 2012-12-10 MED ORDER — IBUPROFEN 600 MG PO TABS
600.0000 mg | ORAL_TABLET | Freq: Four times a day (QID) | ORAL | Status: DC
  Administered 2012-12-11 (×2): 600 mg via ORAL
  Filled 2012-12-10 (×2): qty 1

## 2012-12-10 MED ORDER — KETOROLAC TROMETHAMINE 30 MG/ML IJ SOLN
INTRAMUSCULAR | Status: DC | PRN
  Administered 2012-12-10: 30 mg via INTRAMUSCULAR

## 2012-12-10 MED ORDER — ZOLPIDEM TARTRATE 5 MG PO TABS: 5.0000 mg | ORAL_TABLET | Freq: Every evening | ORAL | Status: DC | PRN

## 2012-12-10 MED ORDER — FENTANYL CITRATE 0.05 MG/ML IJ SOLN
INTRAMUSCULAR | Status: AC
  Filled 2012-12-10: qty 2

## 2012-12-10 MED ORDER — GLYCOPYRROLATE 0.2 MG/ML IJ SOLN
INTRAMUSCULAR | Status: DC | PRN
  Administered 2012-12-10: 0.4 mg via INTRAVENOUS

## 2012-12-10 MED ORDER — DEXAMETHASONE SODIUM PHOSPHATE 10 MG/ML IJ SOLN
INTRAMUSCULAR | Status: AC
  Filled 2012-12-10: qty 1

## 2012-12-10 MED ORDER — SENNOSIDES-DOCUSATE SODIUM 8.6-50 MG PO TABS: 1.0000 | ORAL_TABLET | Freq: Every evening | ORAL | Status: DC | PRN

## 2012-12-10 MED ORDER — KETOROLAC TROMETHAMINE 30 MG/ML IJ SOLN: 15.0000 mg | Freq: Once | INTRAMUSCULAR | Status: DC | PRN

## 2012-12-10 MED ORDER — VASOPRESSIN 20 UNIT/ML IJ SOLN
INTRAMUSCULAR | Status: AC
  Filled 2012-12-10: qty 1

## 2012-12-10 MED ORDER — ESTRADIOL 0.1 MG/GM VA CREA
TOPICAL_CREAM | VAGINAL | Status: AC
  Filled 2012-12-10: qty 42.5

## 2012-12-10 MED ORDER — MIDAZOLAM HCL 2 MG/2ML IJ SOLN
INTRAMUSCULAR | Status: AC
  Filled 2012-12-10: qty 2

## 2012-12-10 MED ORDER — ONDANSETRON HCL 4 MG/2ML IJ SOLN
INTRAMUSCULAR | Status: AC
  Administered 2012-12-10: 4 mg via INTRAVENOUS
  Filled 2012-12-10: qty 2

## 2012-12-10 SURGICAL SUPPLY — 81 items
ADH SKN CLS APL DERMABOND .7 (GAUZE/BANDAGES/DRESSINGS) ×2
BLADE HEX COATED 2.75 (ELECTRODE) IMPLANT
BLADE SURG 11 STRL SS (BLADE) ×5 IMPLANT
BLADE SURG 15 STRL LF C SS BP (BLADE) ×3 IMPLANT
BLADE SURG 15 STRL SS (BLADE) ×6
CANISTER SUCTION 2500CC (MISCELLANEOUS) ×6 IMPLANT
CATH FOLEY 2WAY  5CC 16FR SIL (CATHETERS) ×1
CATH FOLEY 2WAY 5CC 16FR SIL (CATHETERS) ×2 IMPLANT
CATH FOLEY 2WAY SLVR  5CC 18FR (CATHETERS) ×2
CATH FOLEY 2WAY SLVR 5CC 18FR (CATHETERS) ×4 IMPLANT
CATH ROBINSON RED A/P 16FR (CATHETERS) IMPLANT
CHLORAPREP W/TINT 26ML (MISCELLANEOUS) ×3 IMPLANT
CLOTH BEACON ORANGE TIMEOUT ST (SAFETY) ×6 IMPLANT
CONT PATH 16OZ SNAP LID 3702 (MISCELLANEOUS) ×3 IMPLANT
CONT SPECI 4OZ STER CLIK (MISCELLANEOUS) IMPLANT
DECANTER SPIKE VIAL GLASS SM (MISCELLANEOUS) IMPLANT
DERMABOND ADVANCED (GAUZE/BANDAGES/DRESSINGS) ×1
DERMABOND ADVANCED .7 DNX12 (GAUZE/BANDAGES/DRESSINGS) ×2 IMPLANT
DRAIN JACKSON PRT FLT 7MM (DRAIN) IMPLANT
DRAPE HYSTEROSCOPY (DRAPE) ×3 IMPLANT
DRAPE PROXIMA HALF (DRAPES) ×3 IMPLANT
DRESSING TELFA 8X3 (GAUZE/BANDAGES/DRESSINGS) ×3 IMPLANT
DRSG COVADERM PLUS 2X2 (GAUZE/BANDAGES/DRESSINGS) IMPLANT
ELECT NDL TIP 2.8 STRL (NEEDLE) IMPLANT
ELECT NEEDLE TIP 2.8 STRL (NEEDLE) IMPLANT
EVACUATOR SILICONE 100CC (DRAIN) IMPLANT
GAUZE PACKING 2X5 YD STERILE (GAUZE/BANDAGES/DRESSINGS) IMPLANT
GAUZE SPONGE 4X4 16PLY XRAY LF (GAUZE/BANDAGES/DRESSINGS) ×3 IMPLANT
GLOVE BIO SURGEON STRL SZ7.5 (GLOVE) ×3 IMPLANT
GLOVE BIOGEL PI IND STRL 6.5 (GLOVE) ×5 IMPLANT
GLOVE BIOGEL PI IND STRL 7.5 (GLOVE) ×5 IMPLANT
GLOVE BIOGEL PI INDICATOR 6.5 (GLOVE) ×4
GLOVE BIOGEL PI INDICATOR 7.5 (GLOVE) ×4
GLOVE SURG SS PI 6.5 STRL IVOR (GLOVE) ×6 IMPLANT
GOWN PREVENTION PLUS LG XLONG (DISPOSABLE) ×21 IMPLANT
GOWN STRL REIN XL XLG (GOWN DISPOSABLE) ×12 IMPLANT
NDL HYPO 25X1 1.5 SAFETY (NEEDLE) IMPLANT
NDL SPNL 22GX3.5 QUINCKE BK (NEEDLE) ×1 IMPLANT
NEEDLE HYPO 22GX1.5 SAFETY (NEEDLE) ×3 IMPLANT
NEEDLE HYPO 25X1 1.5 SAFETY (NEEDLE) IMPLANT
NEEDLE MAYO .5 CIRCLE (NEEDLE) ×3 IMPLANT
NEEDLE SPNL 22GX3.5 QUINCKE BK (NEEDLE) ×3 IMPLANT
NS IRRIG 1000ML POUR BTL (IV SOLUTION) ×6 IMPLANT
PACK ABDOMINAL GYN (CUSTOM PROCEDURE TRAY) ×3 IMPLANT
PACK VAGINAL WOMENS (CUSTOM PROCEDURE TRAY) ×6 IMPLANT
PAD MAGNETIC INST (MISCELLANEOUS) IMPLANT
PAD OB MATERNITY 4.3X12.25 (PERSONAL CARE ITEMS) ×3 IMPLANT
PROTECTOR NERVE ULNAR (MISCELLANEOUS) ×3 IMPLANT
SET CYSTO W/LG BORE CLAMP LF (SET/KITS/TRAYS/PACK) ×3 IMPLANT
SLING TRANS VAGINAL TAPE (Sling) ×1 IMPLANT
SLING UTERINE/ABD GYNECARE TVT (Sling) ×3 IMPLANT
SPONGE LAP 18X18 X RAY DECT (DISPOSABLE) ×6 IMPLANT
STAPLER VISISTAT 35W (STAPLE) IMPLANT
SUT CHROMIC 0 CT 1 (SUTURE) ×3 IMPLANT
SUT CHROMIC 2 0 CT 1 (SUTURE) ×3 IMPLANT
SUT CHROMIC 2 0 SH (SUTURE) ×3 IMPLANT
SUT CHROMIC 2 0 TIES 18 (SUTURE) IMPLANT
SUT MNCRL AB 3-0 PS2 27 (SUTURE) IMPLANT
SUT MNCRL AB 4-0 PS2 18 (SUTURE) IMPLANT
SUT PDS AB 1 CT  36 (SUTURE)
SUT PDS AB 1 CT 36 (SUTURE) IMPLANT
SUT PLAIN 2 0 XLH (SUTURE) IMPLANT
SUT SILK 0 FSL (SUTURE) IMPLANT
SUT SILK 3 0 FS 1X18 (SUTURE) IMPLANT
SUT VIC AB 0 CT1 18XCR BRD8 (SUTURE) ×6 IMPLANT
SUT VIC AB 0 CT1 27 (SUTURE) ×3
SUT VIC AB 0 CT1 27XBRD ANBCTR (SUTURE) ×2 IMPLANT
SUT VIC AB 0 CT1 8-18 (SUTURE) ×9
SUT VIC AB 2-0 CT1 (SUTURE) ×3 IMPLANT
SUT VIC AB 2-0 SH 27 (SUTURE) ×24
SUT VIC AB 2-0 SH 27XBRD (SUTURE) ×16 IMPLANT
SUT VIC AB 3-0 SH 27 (SUTURE)
SUT VIC AB 3-0 SH 27X BRD (SUTURE) IMPLANT
SUT VICRYL 0 TIES 12 18 (SUTURE) ×3 IMPLANT
SUT VICRYL 0 UR6 27IN ABS (SUTURE) ×9 IMPLANT
SYR 50ML LL SCALE MARK (SYRINGE) IMPLANT
SYR CONTROL 10ML LL (SYRINGE) IMPLANT
SYR TB 1ML 25GX5/8 (SYRINGE) IMPLANT
TOWEL OR 17X24 6PK STRL BLUE (TOWEL DISPOSABLE) ×12 IMPLANT
TRAY FOLEY CATH 14FR (SET/KITS/TRAYS/PACK) ×6 IMPLANT
WATER STERILE IRR 1000ML POUR (IV SOLUTION) ×6 IMPLANT

## 2012-12-10 NOTE — Op Note (Signed)
OPERATIVE REPORT   INDICATIONS:abnormal uterine bleeding, fibroids, pelvic relaxation and urinary incontinence  PRE-OP DIAGNOSIS:Fibroids, uterine prolapse Urinary incontinence   POST-OP DIAGNOSIS;Fibroids, uterine prolapse Urinary incontinence   PROCEDURE:Procedure(s) (LRB): Total Vaginal Hysterectomy (N/A) McCall culdoplasty and the vaginal cuff suspension TRANSVAGINAL TAPE (TVT) PROCEDURE (N/A) CYSTOSCOPY (N/A)   SURGEON:Tahjai Schetter P  ASSIST: Henreitta Leber certified physician assistant  SPECIMENS: Uterus and cervix  DISPOSITION OF SPECIMEN:Pathology  EBL: 300 cc Findings: The uterus was enlarged approximately 12-14 weeks size with multiple myomata. The cervix prolapsed to within 2 cm of the introitus. The ovaries appeared normal bilaterally, as did the tubes.  COMPLICATIONS: None  PATIENT TO:  Remained in the OR and anesthetized and intubated for TVT procedure  PROCEDURE DETAILS: The patient was taken to the operating room after appropriate identification and placed on the operating table.  After the attainment of adequate general anesthesia the patient was placed in the lithotomy position. The perineum and vagina were prepped with multiple layers of Betadine. A Foley catheter was inserted into the bladder and connected to straight drainage.The perineum was draped as a sterile field. A weighted speculum was placed in the posterior vagina and Lahey tenaculae were placed on the anterior and posterior surfaces of the cervix. The cervicovaginal mucosa was injected with a dilute solution of Pitressin. The cervix was circumscribed. The anterior vaginal mucosa wasbluntly dissected off the anterior cervix and the anterior peritoneum entered. The bladder blade was placed and the bladder elevated. The posterior peritoneum was entered sharply and tagged. Uterosacral ligaments on the right and left side were clamped cut and suture ligated, and the sutures held. The paracervical tissues were  then clamped cut and suture ligated. The uterine arteries were clamped cut and suture ligated. The parametral tissues were clamped, cut, and suture ligated.The uterus was morcellated with removal of several intact Uterine fibroids in order to allow the uterus to be inverted and the upper pedicles clamped cut tied with free tie and suture-ligated. The uterus and cervix and fibroids were removed from the operative field.  There is noted to be significant redundant vaginal mucosa posteriorly secondary to the significant uterine prolapse.  The redundant vaginal mucosa was excised between the 2 uterosacral ligaments and the uterosacral ligament sutures reinforced. The McCall culdoplasty sutures were then placed incorporating the uterosacral ligaments on either side, and the intervening peritoneum.  These were held. The vaginal angles were created with the sutures from the  uterosacral ligaments that had been held and placed through the anterior and posterior surfaces of the vagina then tied down. The remainder of the vaginal cuff was closed with figure-of-eight sutures of.  All sutures used were 0 Vicryl.  The McCall culdoplasty sutures were then tied down.  The patient was hemodynamically stable and seemed to have tolerated the procedure well.  The sponge and instrument counts correct.  The patient remained anesthetized in the lithotomy position to allow Dr. Su Hilt to begin the next procedure. This will be dictated under a separate operative report  Hal Morales, MD 1:14 PM

## 2012-12-10 NOTE — H&P (Signed)
  History and Physical Interval Note:   12/10/2012   10:27 AM   April Gonzalez  has presented today for surgery, with the diagnosis of Firbroids, Urinary incontinence  The various methods of treatment have been discussed with the patient and family. After consideration of risks, benefits and other options for treatment, the patient has consented to  Procedure(s): Total Vaginal Hysterectomy possible abdominal hyst with salpingectomy.  Anterior colporrhaphy ANTERIOR REPAIR (CYSTOCELE) HYSTERECTOMY ABDOMINAL TRANSVAGINAL TAPE (TVT) PROCEDURE CYSTOSCOPY as a surgical intervention .  I have examined the pt,k reviewed the patients' chart and labs.  Questions were answered to the patient's satisfaction.     Hal Morales  MD

## 2012-12-10 NOTE — Preoperative (Addendum)
Beta Blockers   Reason not to administer Beta Blockers:Not Applicable 

## 2012-12-10 NOTE — Anesthesia Preprocedure Evaluation (Signed)
Anesthesia Evaluation  Patient identified by MRN, date of birth, ID band Patient awake    Reviewed: Allergy & Precautions, H&P , NPO status , Patient's Chart, lab work & pertinent test results, reviewed documented beta blocker date and time   History of Anesthesia Complications Negative for: history of anesthetic complications  Airway Mallampati: II TM Distance: >3 FB Neck ROM: full    Dental  (+) Teeth Intact   Pulmonary neg pulmonary ROS,  breath sounds clear to auscultation  Pulmonary exam normal       Cardiovascular Exercise Tolerance: Good + Valvular Problems/Murmurs (h/o murmur (per pt), echo 6 years ago to eval murmur) Rhythm:regular Rate:Normal     Neuro/Psych negative neurological ROS  negative psych ROS   GI/Hepatic negative GI ROS, Neg liver ROS,   Endo/Other  negative endocrine ROSneg diabetes  Renal/GU  Bladder dysfunction Female GU complaint     Musculoskeletal   Abdominal   Peds  Hematology  (+) Blood dyscrasia (anemia from menstrual bleeding - transfusion 1/14 and in 2010), anemia , 7.8 hgb    Anesthesia Other Findings   Reproductive/Obstetrics negative OB ROS                           Anesthesia Physical Anesthesia Plan  ASA: II  Anesthesia Plan: General ETT   Post-op Pain Management:    Induction:   Airway Management Planned:   Additional Equipment:   Intra-op Plan:   Post-operative Plan:   Informed Consent: I have reviewed the patients History and Physical, chart, labs and discussed the procedure including the risks, benefits and alternatives for the proposed anesthesia with the patient or authorized representative who has indicated his/her understanding and acceptance.   Dental Advisory Given  Plan Discussed with: CRNA and Surgeon  Anesthesia Plan Comments:         Anesthesia Quick Evaluation

## 2012-12-10 NOTE — Progress Notes (Signed)
Day of Surgery Procedure(s) (LRB): (Late entry.  Pt seen at 1820) Total Vaginal Hysterectomy (N/A) McCall Culdoplasty TRANSVAGINAL TAPE (TVT) PROCEDURE (N/A) CYSTOSCOPY (N/A)  Subjective: Patient reports no nausea and vomiting.  Wants to eat. Lower pelvic pain    Objective: I have reviewed patient's vital signs and intake and output. BP 131/63  Pulse 72  Temp(Src) 98 F (36.7 C) (Oral)  Resp 14  Ht 5\' 3"  (1.6 m)  Wt 175 lb (79.379 kg)  BMI 31.01 kg/m2  SpO2 100%   General: appears stated age, mild distress and sleepy from pain meds Resp: clear to auscultation bilaterally Cardio: regular rate and rhythm, S1, S2 normal, no murmur, click, rub or gallop GI: soft, non-tender; bowel sounds normal; no masses,  no organomegaly Extremities: Homans sign is negative, no sign of DVT Finger tips are pale  Assessment: s/p Procedure(s): Total Vaginal Hysterectomy (N/A) McCall culdoplasty TRANSVAGINAL TAPE (TVT) PROCEDURE (N/A) CYSTOSCOPY (N/A): stable and anemia  Plan: Advance diet Encourage ambulation Advance to PO medication Discharge home in am  LOS: 0 days    Ugonna Keirsey P 12/10/2012, 11:00 PM

## 2012-12-10 NOTE — Anesthesia Postprocedure Evaluation (Signed)
  Anesthesia Post-op Note  Patient: April Gonzalez  Procedure(s) Performed: Procedure(s): Total Vaginal Hysterectomy (N/A) ANTERIOR REPAIR (CYSTOCELE) (N/A) TRANSVAGINAL TAPE (TVT) PROCEDURE (N/A) CYSTOSCOPY (N/A) Patient is easy to awake and appropriate but slowly responsive to communication. She is oriented Pain and nausea are reasonably well controlled. Vital signs are stable and clinically acceptable. Oxygen saturation is clinically acceptable. There are no apparent anesthetic complications at this time. Patient is ready for discharge.

## 2012-12-10 NOTE — Progress Notes (Signed)
Patient slow to respond to questions, however, follows commands without difficulty.  Unable to remain focused on nurse during conversation with eyes glassy looking.  Pupils responsive, tongue midline, grips equal, able to move all extremities equally.  Dr Cristela Blue called to bedside to evaluate patient.  No orders received.  Will continue to allow patient to wake from anesthesia and re-evaluate.

## 2012-12-10 NOTE — Anesthesia Postprocedure Evaluation (Signed)
Anesthesia Post Note  Patient: April Gonzalez  Procedure(s) Performed: Procedure(s) (LRB): Total Vaginal Hysterectomy (N/A) ANTERIOR REPAIR (CYSTOCELE) (N/A) TRANSVAGINAL TAPE (TVT) PROCEDURE (N/A) CYSTOSCOPY (N/A)  Anesthesia type: General  Patient location: Women's Unit  Post pain: Pain level controlled  Post assessment: Post-op Vital signs reviewed  Last Vitals:  Filed Vitals:   12/10/12 1815  BP: 144/77  Pulse: 95  Temp: 36.5 C  Resp: 19    Post vital signs: Reviewed  Level of consciousness: sedated  Complications: No apparent anesthesia complications Anesthesia Post-op Note  Patient: April Gonzalez  Procedure(s) Performed: Procedure(s): Total Vaginal Hysterectomy (N/A) ANTERIOR REPAIR (CYSTOCELE) (N/A) TRANSVAGINAL TAPE (TVT) PROCEDURE (N/A) CYSTOSCOPY (N/A)  Patient Location: PACU and Women's Unit  Anesthesia Type:General  Level of Consciousness: awake  Airway and Oxygen Therapy: Patient Spontanous Breathing  Post-op Pain: none  Post-op Assessment: Post-op Vital signs reviewed  Post-op Vital Signs: Reviewed and stable  Complications: No apparent anesthesia complications

## 2012-12-10 NOTE — Op Note (Signed)
Preop Diagnosis: SUI  Postop Diagnosis: SUI  Procedure:1.TVT 2. Cystoscopy  Fluids: 300 cc  EBL: 5 cc  UOP:  QS  Complications:none  Procedure:The patient had was found in dorsal lithotomy position following the completion of TVH by Dr. Pennie Rushing.  A weighted speculum was placed in the patient's vagina and the anterior vaginal wall was injected with dilute pitressin at a concentration of 10 units of pitressin in a total of 100cc of normal saline.  An incision was made in the anterior wall of the vagina for approximately 1cm beneath the midurethra and the underlying tissue was dissected away from the anterior vaginal wall down to the level of the lower symphysis pubis bilaterally. Attention was then turned to the mons pubis where two 5 mm incisions were made 2 fingerbreadths from the midline. The transabdominal guide was then passed through the mons pubis incision on the patient's right down through the space of Retzius and out through the anterior vaginal wall while deflecting the rigid urethral catheter guide to the ipsilateral side. The same was done on the contralateral side. Cystoscopy was performed and no invadvertant bladder injury was noted. The bladder was drained with a Foley while deflecting the rigid urethral catheter guide to the patient's right and the mesh was attached to the transabdominal guide and elevated up through the space of Retzius and out through the incision on the mons pubis on the ipsilateral side. The same was done on the contralateral side. Cystoscopy was performed again and no inadvertant bladder injury was noted. The 75 French Foley was left in the urethra and a large Tresa Endo was placed between the urethra and the mesh in order to leave the mesh slack beneath the midurethra. The mesh was then cut flush with the skin at the mons pubis incisions bilaterally. Indigo carmine had been administered, cystoscopy was performed again and bilateral ureters were noted to efflux without  difficulty. The bilateral incisions on the mons pubis were then cleaned and Dermabond applied. The anterior vaginal wall incision was repaired with 2-0 vicryl with interrupted stitches.  Vagina was packed with estrogen soaked vaginal packing.  Sponge, lap and needle count was correct.  The patient tolerated the procedure well and was returned to the recovery room in good condition.

## 2012-12-10 NOTE — Transfer of Care (Signed)
Immediate Anesthesia Transfer of Care Note  Patient: April Gonzalez  Procedure(s) Performed: Procedure(s): Total Vaginal Hysterectomy (N/A) ANTERIOR REPAIR (CYSTOCELE) (N/A) TRANSVAGINAL TAPE (TVT) PROCEDURE (N/A) CYSTOSCOPY (N/A)  Patient Location: PACU  Anesthesia Type:General  Level of Consciousness: awake, alert  and oriented  Airway & Oxygen Therapy: Patient Spontanous Breathing and Patient connected to nasal cannula oxygen  Post-op Assessment: Report given to PACU RN and Post -op Vital signs reviewed and stable  Post vital signs: Reviewed and stable  Complications: No apparent anesthesia complications

## 2012-12-11 LAB — CBC
HCT: 22.8 % — ABNORMAL LOW (ref 36.0–46.0)
Hemoglobin: 6.6 g/dL — CL (ref 12.0–15.0)
MCH: 18.9 pg — ABNORMAL LOW (ref 26.0–34.0)
MCHC: 28.9 g/dL — ABNORMAL LOW (ref 30.0–36.0)

## 2012-12-11 MED ORDER — IBUPROFEN 600 MG PO TABS: ORAL_TABLET | ORAL | Status: DC

## 2012-12-11 MED ORDER — ONDANSETRON HCL 4 MG PO TABS: 4.0000 mg | ORAL_TABLET | Freq: Four times a day (QID) | ORAL | Status: DC | PRN

## 2012-12-11 MED ORDER — OXYCODONE-ACETAMINOPHEN 5-325 MG PO TABS: 1.0000 | ORAL_TABLET | Freq: Four times a day (QID) | ORAL | Status: DC | PRN

## 2012-12-11 MED ORDER — CIPROFLOXACIN HCL 500 MG PO TABS: 500.0000 mg | ORAL_TABLET | Freq: Two times a day (BID) | ORAL | Status: DC

## 2012-12-11 MED ORDER — ONDANSETRON HCL 4 MG PO TABS: 4.0000 mg | ORAL_TABLET | Freq: Three times a day (TID) | ORAL | Status: DC | PRN

## 2012-12-11 NOTE — Progress Notes (Signed)
Vaginal packing removed... Scant amount of bright red drainage noted throughout... Pt tolerated well... Pad replaced after packing removed... Will continue to monitor the pt.

## 2012-12-11 NOTE — Progress Notes (Signed)
Pt discharged to home with husband.  Condition stable.  Pt to car via wheelchair with C. Charmian Muff, Charity fundraiser.  No equipment for home ordered at discharge.

## 2012-12-11 NOTE — Progress Notes (Signed)
April Gonzalez is a52 y.o.  [id number]  Post Op Date # 1:  TVH/TVT/Cystoscopy  Subjective: Patient is Postoperative course complicated by posterior headache and grogginess otherwise, patient's operative pain is controlled, ambulated last night with only complaint of her head feeling heavy with standing.  tolerating Jello and liquids and hasn't voided since Foley has been removed.  Objective: Vital signs in last 24 hours: Temp:  [97.6 F (36.4 C)-98.7 F (37.1 C)] 98.6 F (37 C) (08/08 0618) Pulse Rate:  [54-95] 54 (08/08 0618) Resp:  [10-19] 14 (08/08 0618) BP: (125-159)/(63-77) 128/67 mmHg (08/08 0618) SpO2:  [92 %-100 %] 100 % (08/08 0618) Weight:  [175 lb (79.379 kg)] 175 lb (79.379 kg) (08/07 1937)  Intake/Output from previous day: 08/07 0701 - 08/08 0700 In: 4400 [I.V.:4400] Out: 2580 [Urine:2175] Intake/Output this shift:    Recent Labs Lab 12/09/12 1110 12/11/12 0535  WBC 5.5 12.4*  HGB 7.8* 6.6*  HCT 26.7* 22.8*  PLT 275 227    No results found for this basename: NA, K, CL, CO2, BUN, CREATININE, CALCIUM, LABALBU, PROT, BILITOT, ALKPHOS, ALT, AST, GLUCOSE,  in the last 168 hours  EXAM: General: fatigued, mild distress and groggy. Resp: clear to auscultation bilaterally Cardio: regular rate and rhythm, S1, S2 normal, no murmur, click, rub or gallop GI: soft, decreased bowel sounds Extremities: SCD hose in place with no calf tenderness Vaginal Bleeding: none and Mons incisions without evidence of infection.   Assessment: s/p Procedure(s): Total Vaginal Hysterectomy ANTERIOR REPAIR (CYSTOCELE) TRANSVAGINAL TAPE (TVT) PROCEDURE CYSTOSCOPY: stable and anemia  Plan: Advance diet Encourage ambulation Advance to PO medication Discontinue PCA,  orthostatic Bp & P, give antiemetic Routine care  LOS: 1 day    POWELL,ELMIRA, PA-C 12/11/2012 7:52 AM  12:45p Agree with above.  Pt has voided 150cc spontaneously without difficulty and felt normal.  Plan per Dr.  Pennie Rushing. - AYR,MD

## 2012-12-11 NOTE — Pre-Procedure Instructions (Signed)
PAT interview conducted by Rockwell Alexandria, RN for purpose of cross-training.

## 2012-12-11 NOTE — Progress Notes (Signed)
CRITICAL VALUE ALERT  Critical value received: Hemoglobin 6.6  Date of notification:  12/11/12  Time of notification:  0630  Critical value read back:yes  Nurse who received alert:  Hannah Beat, RN  MD notified (1st page):  Dr. Pennie Rushing   Time of first page: [phone]  MD notified (2nd page):   Time of second page:  Responding MD: Dr. Pennie Rushing  Time MD responded:  [phone]  No new orders given

## 2012-12-11 NOTE — Discharge Summary (Signed)
  Physician Discharge Summary  Patient ID: ADALYND DONAHOE MRN: [id number] DOB/AGE: 50-May-1962 50 y.o.  Admit date: 12/10/2012 Discharge date: 12/11/2012   Discharge Diagnoses: Symptomatic Uterine Fibroids Active Problems:   * No active hospital problems. *   Operation: Total Vaginal Hysterectomy, Placement of Tension Free Vaginal Tape and Cystoscopy   Discharged Condition: Stable  Hospital Course: On the date of admission the patient underwent the aforementioned procedures tolerating them well.  Post operative course was unremarkable with the patient resuming bowel and bladder function by post operative day #1 and was therefore deemed ready for discharge home.  Disposition: 01-Home or Self Care  Discharge Medications:    Medication List         ciprofloxacin 500 MG tablet  Commonly known as:  CIPRO  Take 1 tablet (500 mg total) by mouth 2 (two) times daily.     ferrous sulfate 325 (65 FE) MG tablet  Take 325 mg by mouth daily.     ibuprofen 600 MG tablet  Commonly known as:  ADVIL,MOTRIN  1 po pc every 6 hours for 5 days then prn-pain     ondansetron 4 MG tablet  Commonly known as:  ZOFRAN  Take 1 tablet (4 mg total) by mouth every 6 (six) hours as needed for nausea.     oxyCODONE-acetaminophen 5-325 MG per tablet  Commonly known as:  PERCOCET/ROXICET  Take 1-2 tablets by mouth every 6 (six) hours as needed for pain.     tolterodine 4 MG 24 hr capsule  Commonly known as:  DETROL LA  Take 4 mg by mouth daily.          Follow-up:  Dr. Pennie Rushing,  January 21, 2013 at 8:45 a. m.   SignedHenreitta Leber, PA-C 12/11/2012, 8:52 AM   Lat eentryPt seen at approximately 1240 on 12/11/12. S:  States she feels well.  Tolerated regular diet.  Had mild nausea but no vomiting after 2 Percocet.  Good pain relief, but made her very sleepy.  Has voided twice without difficulty.  Had transient numbness on anterior right leg, but no interference with ambulation which she tolerated  without dizziness.  Had transient numbness near right corner of her mouth but no drooling or interferance with eating.  O:  BP 134/78  Pulse 55  Temp(Src) 98.9 F (37.2 C) (Oral)  Resp 18  Ht 5\' 3"  (1.6 m)  Wt 175 lb (79.379 kg)  BMI 31.01 kg/m2  SpO2 100%  Lungs clear.  Heart RRR and no murmur; Abd:  Soft without masses.  Minimal suprapubic tenderness.  No rebound.  Good bowel sounds Legs nontender with neg Homan's  A:  Stable post op course      Chronic anemia with small postop decrease, but no hemodynamic compromise  P:  Take only one Percocet for next pain dose        Will d/c/ home if remains stable.        RN will call me with report in several hours.

## 2012-12-13 LAB — TYPE AND SCREEN

## 2013-06-03 ENCOUNTER — Other Ambulatory Visit: Payer: Self-pay | Admitting: *Deleted

## 2013-06-03 ENCOUNTER — Ambulatory Visit (INDEPENDENT_AMBULATORY_CARE_PROVIDER_SITE_OTHER): Payer: BC Managed Care – PPO | Admitting: *Deleted

## 2013-06-03 DIAGNOSIS — Z23 Encounter for immunization: Secondary | ICD-10-CM

## 2013-06-03 DIAGNOSIS — Z0000 Encounter for general adult medical examination without abnormal findings: Secondary | ICD-10-CM

## 2013-06-20 ENCOUNTER — Emergency Department (HOSPITAL_BASED_OUTPATIENT_CLINIC_OR_DEPARTMENT_OTHER): Payer: No Typology Code available for payment source

## 2013-06-20 ENCOUNTER — Encounter (HOSPITAL_BASED_OUTPATIENT_CLINIC_OR_DEPARTMENT_OTHER): Payer: Self-pay | Admitting: Emergency Medicine

## 2013-06-20 ENCOUNTER — Emergency Department (HOSPITAL_BASED_OUTPATIENT_CLINIC_OR_DEPARTMENT_OTHER)
Admission: EM | Admit: 2013-06-20 | Discharge: 2013-06-20 | Disposition: A | Discharge status: Home / Self Care | Payer: No Typology Code available for payment source | Attending: Emergency Medicine | Admitting: Emergency Medicine

## 2013-06-20 DIAGNOSIS — Z8742 Personal history of other diseases of the female genital tract: Secondary | ICD-10-CM | POA: Insufficient documentation

## 2013-06-20 DIAGNOSIS — R011 Cardiac murmur, unspecified: Secondary | ICD-10-CM | POA: Insufficient documentation

## 2013-06-20 DIAGNOSIS — M069 Rheumatoid arthritis, unspecified: Secondary | ICD-10-CM | POA: Insufficient documentation

## 2013-06-20 DIAGNOSIS — J40 Bronchitis, not specified as acute or chronic: Secondary | ICD-10-CM | POA: Insufficient documentation

## 2013-06-20 DIAGNOSIS — Z8639 Personal history of other endocrine, nutritional and metabolic disease: Secondary | ICD-10-CM | POA: Insufficient documentation

## 2013-06-20 DIAGNOSIS — Z862 Personal history of diseases of the blood and blood-forming organs and certain disorders involving the immune mechanism: Secondary | ICD-10-CM | POA: Insufficient documentation

## 2013-06-20 DIAGNOSIS — R55 Syncope and collapse: Secondary | ICD-10-CM | POA: Insufficient documentation

## 2013-06-20 DIAGNOSIS — Z79899 Other long term (current) drug therapy: Secondary | ICD-10-CM | POA: Insufficient documentation

## 2013-06-20 DIAGNOSIS — Z8669 Personal history of other diseases of the nervous system and sense organs: Secondary | ICD-10-CM | POA: Insufficient documentation

## 2013-06-20 DIAGNOSIS — D649 Anemia, unspecified: Secondary | ICD-10-CM | POA: Insufficient documentation

## 2013-06-20 LAB — URINALYSIS, ROUTINE W REFLEX MICROSCOPIC
Bilirubin Urine: NEGATIVE
Glucose, UA: NEGATIVE mg/dL
HGB URINE DIPSTICK: NEGATIVE
Ketones, ur: NEGATIVE mg/dL
Leukocytes, UA: NEGATIVE
NITRITE: NEGATIVE
PH: 6 (ref 5.0–8.0)
Protein, ur: NEGATIVE mg/dL
SPECIFIC GRAVITY, URINE: 1.015 (ref 1.005–1.030)
UROBILINOGEN UA: 1 mg/dL (ref 0.0–1.0)

## 2013-06-20 LAB — CBC
HCT: 39.4 % (ref 36.0–46.0)
HEMOGLOBIN: 12.8 g/dL (ref 12.0–15.0)
MCH: 27.2 pg (ref 26.0–34.0)
MCHC: 32.5 g/dL (ref 30.0–36.0)
MCV: 83.8 fL (ref 78.0–100.0)
Platelets: 218 10*3/uL (ref 150–400)
RBC: 4.7 MIL/uL (ref 3.87–5.11)
RDW: 14.1 % (ref 11.5–15.5)
WBC: 13.2 10*3/uL — AB (ref 4.0–10.5)

## 2013-06-20 LAB — BASIC METABOLIC PANEL
BUN: 6 mg/dL (ref 6–23)
CHLORIDE: 101 meq/L (ref 96–112)
CO2: 24 mEq/L (ref 19–32)
Calcium: 9.3 mg/dL (ref 8.4–10.5)
Creatinine, Ser: 0.7 mg/dL (ref 0.50–1.10)
GLUCOSE: 105 mg/dL — AB (ref 70–99)
POTASSIUM: 3.7 meq/L (ref 3.7–5.3)
SODIUM: 138 meq/L (ref 137–147)

## 2013-06-20 MED ORDER — ACETAMINOPHEN 325 MG PO TABS: 650.0000 mg | ORAL_TABLET | Freq: Once | ORAL | Status: AC

## 2013-06-20 MED ORDER — ACETAMINOPHEN 325 MG PO TABS
ORAL_TABLET | ORAL | Status: AC
  Administered 2013-06-20: 650 mg via ORAL
  Filled 2013-06-20: qty 2

## 2013-06-20 MED ORDER — ALBUTEROL SULFATE HFA 108 (90 BASE) MCG/ACT IN AERS
2.0000 | INHALATION_SPRAY | RESPIRATORY_TRACT | Status: DC | PRN
  Administered 2013-06-20: 2 via RESPIRATORY_TRACT
  Filled 2013-06-20: qty 6.7

## 2013-06-20 MED ORDER — SODIUM CHLORIDE 0.9 % IV BOLUS (SEPSIS)
1000.0000 mL | Freq: Once | INTRAVENOUS | Status: AC
  Administered 2013-06-20: 1000 mL via INTRAVENOUS

## 2013-06-20 NOTE — ED Provider Notes (Signed)
CSN: [id number]     Arrival date & time 06/20/13  3419 History   First MD Initiated Contact with Patient 06/20/13 [phone]     Chief Complaint  Patient presents with  . Nasal Congestion  . Cough     (Consider location/radiation/quality/duration/timing/severity/associated sxs/prior Treatment) HPI Pt presents with cough and cold symptoms which have been ongoing for approx the last week.  No fever.  Cough is nonproductive.  She also has lost her voice over the past several days.  Also reports that yesterday morning she was feeling lightheaded and nearly fainted.  No LOC.  No chest pain or palpitations.  Endorses drinking plenty of fluids.  No vomiting or diarrhea.  There are no other associated systemic symptoms, there are no other alleviating or modifying factors.   Past Medical History  Diagnosis Date  . Seizures     last seizure age 53 or 53 per pt report  . RA (rheumatoid arthritis)     states was dx at age 49 but not on meds  . H/O uterine prolapse   . Thyroid disorder   . Fibroids     H/O myomectomy  . Anemia 05/2012    transfusion  . Heart murmur   . History of blood transfusion    Past Surgical History  Procedure Laterality Date  . Excision vaginal cyst    . Tubal ligation  10/10/1991  . Myomectomy  48YRS AGO    HYSTEROSCOPIC  . Vaginal hysterectomy N/A 12/10/2012    Procedure: Total Vaginal Hysterectomy;  Surgeon: Eldred Manges, MD;  Location: [facility];  Service: Gynecology;  Laterality: N/A;  . Cystocele repair N/A 12/10/2012    Procedure: ANTERIOR REPAIR (CYSTOCELE);  Surgeon: Eldred Manges, MD;  Location: [facility];  Service: Gynecology;  Laterality: N/A;  . Bladder suspension N/A 12/10/2012    Procedure: TRANSVAGINAL TAPE (TVT) PROCEDURE;  Surgeon: Delice Lesch, MD;  Location: [facility];  Service: Gynecology;  Laterality: N/A;  . Cystoscopy N/A 12/10/2012    Procedure: CYSTOSCOPY;  Surgeon: Delice Lesch, MD;  Location: [facility];  Service: Gynecology;  Laterality: N/A;    Family History  Problem Relation Age of Onset  . Heart failure Father   . Diabetes Mother   . High blood pressure Mother   . Arthritis Mother   . Hypertension Mother   . Breast cancer Maternal Aunt     in her 65's   . Breast cancer Cousin    History  Substance Use Topics  . Smoking status: Never Smoker   . Smokeless tobacco: Never Used  . Alcohol Use: Yes     Comment: rare   OB History   Grav Para Term Preterm Abortions TAB SAB Ect Mult Living   2 2 2       2      Review of Systems ROS reviewed and all otherwise negative except for mentioned in HPI    Allergies  Other; Methimazole; Nitroglycerin; Terbutaline; and Tetracycline hcl  Home Medications   Current Outpatient Rx  Name  Route  Sig  Dispense  Refill  . ferrous sulfate 325 (65 FE) MG tablet   Oral   Take 325 mg by mouth daily.         Marland Kitchen ibuprofen (ADVIL,MOTRIN) 600 MG tablet      1 po pc every 6 hours for 5 days then prn-pain   30 tablet   1    BP 143/91  Pulse 95  Temp(Src) 98.2 F (36.8 C) (  Oral)  Resp 18  Ht 5\' 3"  (1.6 m)  SpO2 97%  LMP 12/03/2012 Vitals reviewed Physical Exam Physical Examination: General appearance - alert, well appearing, and in no distress Mental status - alert, oriented to person, place, and time Eyes - no scleral icterus, no conjunctival injection Mouth - mucous membranes moist, pharynx normal without lesions, mild erythema of OP, palate symmetric, uvula midline Neck - supple, no significant adenopathy Chest - clear to auscultation, no wheezes, rales or rhonchi, symmetric air entry Heart - normal rate, regular rhythm, normal S1, S2, no murmurs, rubs, clicks or gallops Abdomen - soft, nontender, nondistended, no masses or organomegaly Extremities - peripheral pulses normal, no pedal edema, no clubbing or cyanosis Skin - normal coloration and turgor, no rashes  ED Course  Procedures (including critical care time) Labs Review Labs Reviewed  CBC - Abnormal;  Notable for the following:    WBC 13.2 (*)    All other components within normal limits  BASIC METABOLIC PANEL - Abnormal; Notable for the following:    Glucose, Bld 105 (*)    All other components within normal limits  URINALYSIS, ROUTINE W REFLEX MICROSCOPIC   Imaging Review Dg Chest 2 View  06/20/2013   CLINICAL DATA:  Cough and congestion.  EXAM: CHEST - 2 VIEW  COMPARISON:  DG CHEST 2 VIEW dated 05/23/2012; DG CHEST 1V PORT dated 07/05/2009  FINDINGS: The heart size and mediastinal contours are within normal limits. There is no evidence of pulmonary edema, consolidation, pneumothorax, nodule or pleural fluid. The visualized skeletal structures are unremarkable.  IMPRESSION: No active disease.   Electronically Signed   By: Aletta Edouard M.D.   On: 06/20/2013 09:30    EKG Interpretation    Date/Time:  Sunday June 20 2013 09:07:54 EST Ventricular Rate:  87 PR Interval:  174 QRS Duration: 72 QT Interval:  364 QTC Calculation: 438 R Axis:   36 Text Interpretation:  Normal sinus rhythm Normal ECG Since previous tracing t wave abnormalities are no longer present Confirmed by Canary Brim  MD, MARTHA [id number]) on 06/20/2013 1:18:48 PM            MDM   Final diagnoses:  Bronchitis  Near syncope    Pt presenting with c/o cough, congestion.  CXR reassuring.  Suspect bronchitis.  She also states the had near syncope and weakness this morning.  EKG reassuring, labs show no sign of anemia or other acute abnormality.  Discharged with strict return precautions.  Pt agreeable with plan.    Threasa Beards, MD 06/21/13 [phone]

## 2013-06-20 NOTE — Discharge Instructions (Signed)
Return to the ED with any concerns including difficulty breathing, fainting, chest pain, palpitations, decreased level of alertness/lethargy, or any other alarming symptoms  You should use the albuterol inhaler 2 puffs every 4 hours

## 2013-06-20 NOTE — ED Notes (Addendum)
Patient here with cold symptoms and cough, congestion x 1 week. Reports fever with same. Cough is dry, taking otc meds with no relief. No distress

## 2013-06-21 ENCOUNTER — Ambulatory Visit (INDEPENDENT_AMBULATORY_CARE_PROVIDER_SITE_OTHER): Payer: No Typology Code available for payment source | Admitting: Physician Assistant

## 2013-06-21 ENCOUNTER — Encounter: Payer: Self-pay | Admitting: Physician Assistant

## 2013-06-21 VITALS — BP 130/84 | HR 85 | Temp 98.4°F | Ht 63.5 in | Wt 180.8 lb

## 2013-06-21 DIAGNOSIS — B9689 Other specified bacterial agents as the cause of diseases classified elsewhere: Secondary | ICD-10-CM

## 2013-06-21 DIAGNOSIS — J40 Bronchitis, not specified as acute or chronic: Secondary | ICD-10-CM

## 2013-06-21 DIAGNOSIS — J329 Chronic sinusitis, unspecified: Secondary | ICD-10-CM

## 2013-06-21 DIAGNOSIS — A499 Bacterial infection, unspecified: Secondary | ICD-10-CM

## 2013-06-21 MED ORDER — AMOXICILLIN-POT CLAVULANATE 875-125 MG PO TABS: 1.0000 | ORAL_TABLET | Freq: Two times a day (BID) | ORAL | Status: DC

## 2013-06-21 NOTE — Progress Notes (Signed)
    Patient ID: April Gonzalez is a 53 y.o. female DOB: 30-Mar-1961 MRN: [id number]  Subjective:    HPI:  complains of head cold symptoms, ?sinusitus Onset >1 week ago, initially improved then relapsing and worse symptoms  First associated with rhinorrhea, sneezing, sore throat, mild headache and low grade fever Now sinus pressure and mild-mod nasal congestion, yellow-green discharge No relief with OTC meds Precipitated by sick contacts and weather change  Seen in ED one day PTA, xray negative for pneumonia, dx with bronchitis and provided albuterol inhaler. Increased facial pain and fullness today.    Past Medical History  Diagnosis Date  . Seizures     last seizure age 45 or 10 per pt report  . RA (rheumatoid arthritis)     states was dx at age 54 but not on meds  . H/O uterine prolapse   . Thyroid disorder   . Fibroids     H/O myomectomy  . Anemia 05/2012    transfusion  . Heart murmur   . History of blood transfusion     Review of Systems Constitutional: No night sweats, no unexpected weight change Pulmonary: No pleurisy or hemoptysis Cardiovascular: No chest pain or palpitations     Objective:   Physical Exam BP 130/84  Pulse 85  Temp(Src) 98.4 F (36.9 C) (Oral)  Ht 5' 3.5" (1.613 m)  Wt 180 lb 12.8 oz (82.01 kg)  BMI 31.52 kg/m2  SpO2 93%  LMP 12/03/2012 GEN: mildly ill appearing and audible head/chest congestion HENT: NCAT, mild sinus tenderness bilaterally, nares with thick discharge and turbinate swelling, oropharynx mild erythema and PND, no exudate Eyes: Vision grossly intact, no conjunctivitis Lungs: Clear to auscultation without rhonchi or wheeze, no increased work of breathing Cardiovascular: Regular rate and rhythm, no bilateral edema  Lab Results  Component Value Date   WBC 13.2* 06/20/2013   HGB 12.8 06/20/2013   HCT 39.4 06/20/2013   PLT 218 06/20/2013   GLUCOSE 105* 06/20/2013   CHOL 206* 08/04/2012   TRIG 148.0 08/04/2012   HDL 40.30 08/04/2012     LDLDIRECT 140.7 08/04/2012   LDLCALC 132* 09/19/2006   ALT 10 05/24/2012   AST 19 05/24/2012   NA 138 06/20/2013   K 3.7 06/20/2013   CL 101 06/20/2013   CREATININE 0.70 06/20/2013   BUN 6 06/20/2013   CO2 24 06/20/2013   TSH 1.18 08/04/2012   INR 1.20 05/24/2012   HGBA1C 5.5 08/04/2012      Assessment & Plan:  Viral URI > progression to acute sinusitis Cough, postnasal drip related to above   Empiric antibiotics prescribed due to symptom duration greater than 7 days and progression despite OTC symptomatic care Will continue use of Mucinex and albuterol inhaler as needed for cough. Symptomatic care with Tylenol or Advil,  hydration and rest -  Saline irrigation and salt gargle advised as needed

## 2013-06-21 NOTE — Progress Notes (Signed)
Pre-visit discussion using our clinic review tool. No additional management support is needed unless otherwise documented below in the visit note.  

## 2013-06-21 NOTE — Patient Instructions (Signed)
It was great to meet you today April Gonzalez!  I have written a prescription for an antibiotic, please take as directed.  Continue use of Mucinex and albuterol as needed.   Sinusitis Sinusitis is redness, soreness, and puffiness (inflammation) of the air pockets in the bones of your face (sinuses). The redness, soreness, and puffiness can cause air and mucus to get trapped in your sinuses. This can allow germs to grow and cause an infection.  HOME CARE   Drink enough fluids to keep your pee (urine) clear or pale yellow.  Use a humidifier in your home.  Run a hot shower to create steam in the bathroom. Sit in the bathroom with the door closed. Breathe in the steam 3 4 times a day.  Put a warm, moist washcloth on your face 3 4 times a day, or as told by your doctor.  Use salt water sprays (saline sprays) to wet the thick fluid in your nose. This can help the sinuses drain.  Only take medicine as told by your doctor. GET HELP RIGHT AWAY IF:   Your pain gets worse.  You have very bad headaches.  You are sick to your stomach (nauseous).  You throw up (vomit).  You are very sleepy (drowsy) all the time.  Your face is puffy (swollen).  Your vision changes.  You have a stiff neck.  You have trouble breathing. MAKE SURE YOU:   Understand these instructions.  Will watch your condition.  Will get help right away if you are not doing well or get worse. Document Released: 10/09/2007 Document Revised: 01/15/2012 Document Reviewed: 11/26/2011 [facility] Patient Information 2014 [place].

## 2013-06-24 ENCOUNTER — Ambulatory Visit (INDEPENDENT_AMBULATORY_CARE_PROVIDER_SITE_OTHER): Payer: No Typology Code available for payment source | Admitting: Physician Assistant

## 2013-06-24 ENCOUNTER — Encounter: Payer: Self-pay | Admitting: Physician Assistant

## 2013-06-24 VITALS — BP 130/90 | HR 88 | Temp 97.8°F | Resp 16 | Ht 63.0 in | Wt 181.0 lb

## 2013-06-24 DIAGNOSIS — R05 Cough: Secondary | ICD-10-CM

## 2013-06-24 DIAGNOSIS — B9689 Other specified bacterial agents as the cause of diseases classified elsewhere: Secondary | ICD-10-CM

## 2013-06-24 DIAGNOSIS — J329 Chronic sinusitis, unspecified: Secondary | ICD-10-CM

## 2013-06-24 DIAGNOSIS — R059 Cough, unspecified: Secondary | ICD-10-CM

## 2013-06-24 DIAGNOSIS — A499 Bacterial infection, unspecified: Secondary | ICD-10-CM

## 2013-06-24 MED ORDER — BENZONATATE 100 MG PO CAPS: 100.0000 mg | ORAL_CAPSULE | Freq: Three times a day (TID) | ORAL | Status: DC

## 2013-06-24 NOTE — Progress Notes (Signed)
Pre visit review using our clinic review tool, if applicable. No additional management support is needed unless otherwise documented below in the visit note. 

## 2013-06-24 NOTE — Patient Instructions (Signed)
I have sent prescription for Tessalon to your pharmacy. Please take as directed.  Cough, Adult  A cough is a reflex. It helps you clear your throat and airways. A cough can help heal your body. A cough can last 2 or 3 weeks (acute) or may last more than 8 weeks (chronic). Some common causes of a cough can include an infection, allergy, or a cold. HOME CARE  Only take medicine as told by your doctor.  If given, take your medicines (antibiotics) as told. Finish them even if you start to feel better.  Use a cold steam vaporizer or humidier in your home. This can help loosen thick spit (secretions).  Sleep so you are almost sitting up (semi-upright). Use pillows to do this. This helps reduce coughing.  Rest as needed.  Stop smoking if you smoke. GET HELP RIGHT AWAY IF:  You have yellowish-white fluid (pus) in your thick spit.  Your cough gets worse.  Your medicine does not reduce coughing, and you are losing sleep.  You cough up blood.  You have trouble breathing.  Your pain gets worse and medicine does not help.  You have a fever. MAKE SURE YOU:   Understand these instructions.  Will watch your condition.  Will get help right away if you are not doing well or get worse. Document Released: 01/03/2011 Document Revised: 07/15/2011 Document Reviewed: 01/03/2011 [facility] Patient Information 2014 [place].

## 2013-06-24 NOTE — Progress Notes (Signed)
Subjective:    Patient ID: April Gonzalez, female    DOB: 09/17/1960, 53 y.o.   MRN: [id number]  HPI Comments: Patient is a 53 year old female who presents to the office with complaint of cough. Recently seen here diagnosed with acute bacterial sinusitis, has been on Augmentin for last three days. Four days PTA was seen at ED for bronchitis and near syncope, had normal chest xray and ECG, placed on albuterol inhaler at discharge. Reports is not using inhaler often, has only used twice since discharge from ED. Patient reports today has started with a tickle in her throat causing a "cartoon" like cough. Reports still having sinus pressure and headache. Denies ear pain, fever, pain/difficulty swallowing, SOB, chest pain/palpitations, change in bowel/bladder habits or wheezing.  Past Medical History  Diagnosis Date  . Seizures     last seizure age 38 or 10 per pt report  . RA (rheumatoid arthritis)     states was dx at age 89 but not on meds  . H/O uterine prolapse   . Thyroid disorder   . Fibroids     H/O myomectomy  . Anemia 05/2012    transfusion  . Heart murmur   . History of blood transfusion      Review of Systems  Constitutional: Positive for appetite change and fatigue. Negative for fever.  HENT: Positive for sinus pressure. Negative for ear pain, postnasal drip, rhinorrhea, sneezing, sore throat, trouble swallowing and voice change.   Eyes: Negative for pain.  Respiratory: Positive for cough (dry, non productive). Negative for shortness of breath.   Cardiovascular: Negative for chest pain and palpitations.  Gastrointestinal: Negative for diarrhea.  Neurological: Positive for dizziness (intermittent) and headaches (sinus).  All other systems reviewed and are negative.       Objective:   Physical Exam  Vitals reviewed. Constitutional: She is oriented to person, place, and time. She appears well-developed and well-nourished. No distress.  HENT:  Head: Normocephalic and  atraumatic.  Right Ear: Tympanic membrane, external ear and ear canal normal.  Left Ear: Tympanic membrane, external ear and ear canal normal.  Nose: Right sinus exhibits maxillary sinus tenderness and frontal sinus tenderness. Left sinus exhibits frontal sinus tenderness.  Mouth/Throat: Uvula is midline, oropharynx is clear and moist and mucous membranes are normal.  Eyes: Conjunctivae are normal. Pupils are equal, round, and reactive to light. No scleral icterus.  Neck: Normal range of motion.  Cardiovascular: Normal rate and regular rhythm.  Exam reveals no gallop and no friction rub.   No murmur heard. Pulmonary/Chest: Effort normal and breath sounds normal. No respiratory distress. She has no wheezes. She has no rhonchi. She has no rales.  Musculoskeletal: Normal range of motion.  Lymphadenopathy:    She has no cervical adenopathy.  Neurological: She is alert and oriented to person, place, and time.  Skin: Skin is warm and dry.  Psychiatric: She has a normal mood and affect.   Wt Readings from Last 3 Encounters:  06/24/13 181 lb (82.101 kg)  06/21/13 180 lb 12.8 oz (82.01 kg)  12/10/12 175 lb (79.379 kg)   Temp Readings from Last 3 Encounters:  06/24/13 97.8 F (36.6 C) Oral  06/21/13 98.4 F (36.9 C) Oral  06/20/13 98.2 F (36.8 C) Oral   BP Readings from Last 3 Encounters:  06/24/13 130/90  06/21/13 130/84  06/20/13 143/91   Pulse Readings from Last 3 Encounters:  06/24/13 88  06/21/13 85  06/20/13 95   Lab  Results  Component Value Date   WBC 13.2* 06/20/2013   HGB 12.8 06/20/2013   HCT 39.4 06/20/2013   PLT 218 06/20/2013   GLUCOSE 105* 06/20/2013   CHOL 206* 08/04/2012   TRIG 148.0 08/04/2012   HDL 40.30 08/04/2012   LDLDIRECT 140.7 08/04/2012   LDLCALC 132* 09/19/2006   ALT 10 05/24/2012   AST 19 05/24/2012   NA 138 06/20/2013   K 3.7 06/20/2013   CL 101 06/20/2013   CREATININE 0.70 06/20/2013   BUN 6 06/20/2013   CO2 24 06/20/2013   TSH 1.18 08/04/2012   INR 1.20  05/24/2012   HGBA1C 5.5 08/04/2012       Assessment & Plan:    Acute bacterial sinusitis Continue taking antibiotic until complete Counseled patient on getting plenty of rest and stay well hydrated.  Offered note for school, patient declined stating next class not till next Monday.   Cough: Can continue with Robitussin DM Rx for Tessalon

## 2013-07-02 ENCOUNTER — Ambulatory Visit (INDEPENDENT_AMBULATORY_CARE_PROVIDER_SITE_OTHER): Payer: No Typology Code available for payment source | Admitting: *Deleted

## 2013-07-02 DIAGNOSIS — Z23 Encounter for immunization: Secondary | ICD-10-CM

## 2013-07-13 ENCOUNTER — Ambulatory Visit (INDEPENDENT_AMBULATORY_CARE_PROVIDER_SITE_OTHER): Payer: No Typology Code available for payment source | Admitting: Family Medicine

## 2013-07-13 ENCOUNTER — Encounter: Payer: Self-pay | Admitting: Family Medicine

## 2013-07-13 VITALS — BP 124/82 | HR 99 | Temp 98.2°F | Wt 181.0 lb

## 2013-07-13 DIAGNOSIS — R05 Cough: Secondary | ICD-10-CM

## 2013-07-13 DIAGNOSIS — M069 Rheumatoid arthritis, unspecified: Secondary | ICD-10-CM

## 2013-07-13 DIAGNOSIS — R059 Cough, unspecified: Secondary | ICD-10-CM

## 2013-07-13 LAB — RHEUMATOID FACTOR: Rhuematoid fact SerPl-aCnc: <10 IU/mL (ref <=14)

## 2013-07-13 MED ORDER — LORATADINE 10 MG PO TABS: 10.0000 mg | ORAL_TABLET | Freq: Every day | ORAL | Status: DC

## 2013-07-13 MED ORDER — HYDROCOD POLST-CHLORPHEN POLST 10-8 MG/5ML PO LQCR
5.0000 mL | Freq: Every evening | ORAL | Status: DC | PRN
Start: 2013-07-13 — End: 2013-07-14

## 2013-07-13 MED ORDER — PREDNISONE 10 MG PO TABS: ORAL_TABLET | ORAL | Status: DC

## 2013-07-13 MED ORDER — OMEPRAZOLE 20 MG PO CPDR
20.0000 mg | DELAYED_RELEASE_CAPSULE | Freq: Every day | ORAL | Status: DC
Start: 2013-07-13 — End: 2013-12-21

## 2013-07-13 NOTE — Progress Notes (Signed)
  Subjective:     April Gonzalez is a 53 y.o. female here for evaluation of a cough. Onset of symptoms was 1 month ago. Symptoms have been unchanged since that time. The cough is hoarse and productive and is aggravated by nothing. Associated symptoms include: shortness of breath and sputum production. Patient does not have a history of asthma. Patient does not have a history of environmental allergens. Patient has not traveled recently. Patient does not have a history of smoking. Patient has had a previous chest x-ray. Patient has not had a PPD done.  The following portions of the patient's history were reviewed and updated as appropriate: allergies, current medications, past family history, past medical history, past social history, past surgical history and problem list.  Review of Systems Pertinent items are noted in HPI.    Objective:  96 Oxygen saturation 96% on room air BP 124/82  Pulse 99  Temp(Src) 98.2 F (36.8 C) (Oral)  Wt 181 lb (82.101 kg)  SpO2 96%  LMP 12/03/2012 General appearance: alert, cooperative, appears stated age and no distress Head: Normocephalic, without obvious abnormality, atraumatic Ears: normal TM's and external ear canals both ears Nose: Nares normal. Septum midline. Mucosa normal. No drainage or sinus tenderness., no discharge, no sinus tenderness Throat: lips, mucosa, and tongue normal; teeth and gums normal Neck: moderate anterior cervical adenopathy, supple, symmetrical, trachea midline and thyroid not enlarged, symmetric, no tenderness/mass/nodules Lungs: clear to auscultation bilaterally Heart: regular rate and rhythm, S1, S2 normal, no murmur, click, rub or gallop   ext-- hands swellings and pain per pt Assessment:    cough-- ? Allergies vs gerd   Plan:    Antibiotics per medication orders. Antitussives per medication orders. Avoid exposure to tobacco smoke and fumes. Call if shortness of breath worsens, blood in sputum, change in character of  cough, development of fever or chills, inability to maintain nutrition and hydration. Avoid exposure to tobacco smoke and fumes. Trial of antihistamines. steroid taper  omeprazole

## 2013-07-13 NOTE — Progress Notes (Signed)
Pre visit review using our clinic review tool, if applicable. No additional management support is needed unless otherwise documented below in the visit note. 

## 2013-07-13 NOTE — Assessment & Plan Note (Signed)
Pt never saw rheum and is having more problems with pain now Refer to rheum  Check labs

## 2013-07-13 NOTE — Patient Instructions (Signed)

## 2013-07-14 ENCOUNTER — Ambulatory Visit: Payer: No Typology Code available for payment source | Admitting: Physician Assistant

## 2013-07-14 ENCOUNTER — Telehealth: Payer: Self-pay | Admitting: Physician Assistant

## 2013-07-14 LAB — BASIC METABOLIC PANEL
BUN: 12 mg/dL (ref 6–23)
CHLORIDE: 103 meq/L (ref 96–112)
CO2: 22 mEq/L (ref 19–32)
CREATININE: 0.7 mg/dL (ref 0.4–1.2)
Calcium: 9.4 mg/dL (ref 8.4–10.5)
GFR: 109.78 mL/min (ref >60.00)
Glucose, Bld: 108 mg/dL — ABNORMAL HIGH (ref 70–99)
Potassium: 4 mEq/L (ref 3.5–5.1)
Sodium: 135 mEq/L (ref 135–145)

## 2013-07-14 LAB — HEPATIC FUNCTION PANEL
ALBUMIN: 4.3 g/dL (ref 3.5–5.2)
ALT: 18 U/L (ref 0–35)
AST: 17 U/L (ref 0–37)
Alkaline Phosphatase: 105 U/L (ref 39–117)
Bilirubin, Direct: 0 mg/dL (ref 0.0–0.3)
TOTAL PROTEIN: 8.4 g/dL — AB (ref 6.0–8.3)
Total Bilirubin: 0.3 mg/dL (ref 0.3–1.2)

## 2013-07-14 LAB — CBC WITH DIFFERENTIAL/PLATELET
BASOS PCT: 0.4 % (ref 0.0–3.0)
Basophils Absolute: 0 10*3/uL (ref 0.0–0.1)
EOS PCT: 5.1 % — AB (ref 0.0–5.0)
Eosinophils Absolute: 0.4 10*3/uL (ref 0.0–0.7)
HCT: 41.4 % (ref 36.0–46.0)
HEMOGLOBIN: 13.7 g/dL (ref 12.0–15.0)
LYMPHS PCT: 17.8 % (ref 12.0–46.0)
Lymphs Abs: 1.6 10*3/uL (ref 0.7–4.0)
MCHC: 33 g/dL (ref 30.0–36.0)
MCV: 83.1 fl (ref 78.0–100.0)
Monocytes Absolute: 0.3 10*3/uL (ref 0.1–1.0)
Monocytes Relative: 3.2 % (ref 3.0–12.0)
NEUTROS ABS: 6.5 10*3/uL (ref 1.4–7.7)
NEUTROS PCT: 73.5 % (ref 43.0–77.0)
Platelets: 268 10*3/uL (ref 150.0–400.0)
RBC: 4.98 Mil/uL (ref 3.87–5.11)
RDW: 14.9 % — ABNORMAL HIGH (ref 11.5–14.6)
WBC: 8.8 10*3/uL (ref 4.5–10.5)

## 2013-07-14 LAB — TSH: TSH: 0.61 u[IU]/mL (ref 0.35–5.50)

## 2013-07-14 LAB — ANA: Anti Nuclear Antibody(ANA): NEGATIVE

## 2013-07-14 MED ORDER — GUAIFENESIN-CODEINE 100-10 MG/5ML PO SYRP: 5.0000 mL | ORAL_SOLUTION | Freq: Every day | ORAL | Status: DC

## 2013-07-14 NOTE — Telephone Encounter (Signed)
Rx faxed and the patient has been made aware.     KP 

## 2013-07-14 NOTE — Telephone Encounter (Signed)
cheratussin 6 oz  1 -2 tsp po qhs prn  

## 2013-07-14 NOTE — Telephone Encounter (Signed)
Please advise      KP 

## 2013-07-14 NOTE — Telephone Encounter (Signed)
Patient called and stated that the chlorpheniramine-HYDROcodone [facility]) 10-8 MG/5ML Gaspar Skeeters is too expensive. She would like for something else to be called if possible. Please advise.

## 2013-10-06 ENCOUNTER — Telehealth: Payer: Self-pay

## 2013-10-06 NOTE — Telephone Encounter (Signed)
UDS: 09/15/2013 Negative for oxycodone (PRN) Negative for alprazolam (PRN) Low risk per Dr Larose Kells

## 2013-10-11 ENCOUNTER — Telehealth: Payer: Self-pay

## 2013-10-11 NOTE — Telephone Encounter (Signed)
Left message for call back Non-identifiable   Pap- 06/30/12- negative for intraepithelial lesions or malignancy; positive HSV I & II CCS-DUE MMg- 07/09/12-negative Td- 05/06/06 PNA- DUE Urine Microalbumin- DUE Hemoglobin A1c- DUE Foot Exam- DUE Eye Exam- Due

## 2013-10-12 ENCOUNTER — Telehealth: Payer: Self-pay | Admitting: *Deleted

## 2013-10-12 ENCOUNTER — Encounter: Payer: No Typology Code available for payment source | Admitting: Family Medicine

## 2013-10-12 DIAGNOSIS — Z0289 Encounter for other administrative examinations: Secondary | ICD-10-CM

## 2013-10-12 NOTE — Telephone Encounter (Signed)
When looking at her information, her PCP is [facility] [place], a [place] in the [place] office.  It was changed from [facility] 06/21/2013, who left [facility].    On 07/13/2013, [name] scheduled her CPE with [facility].    Pt states she does want to stay at the [facility] office.  I advised her that her CPE needs to be scheduled with her PCP.  With all the confusion, pt request not to receive a no show fee for todays visit that was scheduled with [facility].  Gave her [facility] number, and transferred her to make her CPE appt.  bw

## 2013-10-12 NOTE — Telephone Encounter (Signed)
Caller name:  A[place] Relation to pt:  self Call back number:  [phone] Pharmacy:  Reason for call:   Pt called, states she cancelled the appt for her physical today a couple weeks ago due to her class schedule.  States it was rescheduled for after July 4th, but within the first 10 days of July.  The appt was not changed in Epic and there are no notes in her chart from anyone talking to her about this.  Please advise.

## 2013-10-13 NOTE — Telephone Encounter (Signed)
Appointment cancelled

## 2013-12-15 ENCOUNTER — Telehealth: Payer: Self-pay

## 2013-12-15 NOTE — Telephone Encounter (Signed)
LVM for pt to call back and reschedule CPE with PCP

## 2013-12-21 ENCOUNTER — Emergency Department (HOSPITAL_COMMUNITY)
Admission: EM | Admit: 2013-12-21 | Discharge: 2013-12-21 | Disposition: A | Discharge status: Home / Self Care | Payer: No Typology Code available for payment source | Attending: Emergency Medicine | Admitting: Emergency Medicine

## 2013-12-21 ENCOUNTER — Encounter (HOSPITAL_COMMUNITY): Payer: Self-pay | Admitting: Emergency Medicine

## 2013-12-21 ENCOUNTER — Emergency Department (HOSPITAL_COMMUNITY)
Admission: EM | Admit: 2013-12-21 | Discharge: 2013-12-22 | Disposition: A | Discharge status: Home / Self Care | Payer: No Typology Code available for payment source

## 2013-12-21 DIAGNOSIS — M069 Rheumatoid arthritis, unspecified: Secondary | ICD-10-CM | POA: Insufficient documentation

## 2013-12-21 DIAGNOSIS — IMO0002 Reserved for concepts with insufficient information to code with codable children: Secondary | ICD-10-CM | POA: Diagnosis not present

## 2013-12-21 DIAGNOSIS — R21 Rash and other nonspecific skin eruption: Secondary | ICD-10-CM | POA: Insufficient documentation

## 2013-12-21 DIAGNOSIS — Z79899 Other long term (current) drug therapy: Secondary | ICD-10-CM | POA: Diagnosis not present

## 2013-12-21 DIAGNOSIS — R110 Nausea: Secondary | ICD-10-CM | POA: Diagnosis not present

## 2013-12-21 DIAGNOSIS — R011 Cardiac murmur, unspecified: Secondary | ICD-10-CM | POA: Insufficient documentation

## 2013-12-21 DIAGNOSIS — D649 Anemia, unspecified: Secondary | ICD-10-CM | POA: Insufficient documentation

## 2013-12-21 DIAGNOSIS — B029 Zoster without complications: Secondary | ICD-10-CM | POA: Diagnosis not present

## 2013-12-21 DIAGNOSIS — Z8742 Personal history of other diseases of the female genital tract: Secondary | ICD-10-CM | POA: Diagnosis not present

## 2013-12-21 MED ORDER — VALACYCLOVIR HCL 500 MG PO TABS
1000.0000 mg | ORAL_TABLET | Freq: Once | ORAL | Status: DC
  Filled 2013-12-21: qty 2

## 2013-12-21 MED ORDER — VALACYCLOVIR HCL 1 G PO TABS: 1000.0000 mg | ORAL_TABLET | Freq: Three times a day (TID) | ORAL | Status: AC

## 2013-12-21 NOTE — ED Provider Notes (Signed)
CSN: [id number]     Arrival date & time 12/21/13  2031 History  This chart was scribed for non-physician practitioner working with Wandra Arthurs, MD by Mercy Moore, ED Scribe. This patient was seen in room TR06C/TR06C and the patient's care was started at 9:06 PM.   Chief Complaint  Patient presents with  . Herpes Zoster    The patient said she started having what she thought was insect bites on Sunday.  Today she said the "stinging and itching" has gotten worse so she decided to come in.    HPI HPI Comments: April Gonzalez is a 53 y.o. female who presents to the Emergency Department complaining of shingles flare up, onset two days ago. Patient reports itchy rash on her right flank. She reports stinging and burning pain only with applied pressure. Patient reports having chicken pox as a child.   Past Medical History  Diagnosis Date  . Seizures     last seizure age 71 or 10 per pt report  . RA (rheumatoid arthritis)     states was dx at age 55 but not on meds  . H/O uterine prolapse   . Thyroid disorder   . Fibroids     H/O myomectomy  . Anemia 05/2012    transfusion  . Heart murmur   . History of blood transfusion    Past Surgical History  Procedure Laterality Date  . Excision vaginal cyst    . Tubal ligation  10/10/1991  . Myomectomy  65YRS AGO    HYSTEROSCOPIC  . Vaginal hysterectomy N/A 12/10/2012    Procedure: Total Vaginal Hysterectomy;  Surgeon: Eldred Manges, MD;  Location: [facility];  Service: Gynecology;  Laterality: N/A;  . Cystocele repair N/A 12/10/2012    Procedure: ANTERIOR REPAIR (CYSTOCELE);  Surgeon: Eldred Manges, MD;  Location: [facility];  Service: Gynecology;  Laterality: N/A;  . Bladder suspension N/A 12/10/2012    Procedure: TRANSVAGINAL TAPE (TVT) PROCEDURE;  Surgeon: Delice Lesch, MD;  Location: [facility];  Service: Gynecology;  Laterality: N/A;  . Cystoscopy N/A 12/10/2012    Procedure: CYSTOSCOPY;  Surgeon: Delice Lesch, MD;  Location: [facility];  Service:  Gynecology;  Laterality: N/A;   Family History  Problem Relation Age of Onset  . Heart failure Father   . Diabetes Mother   . High blood pressure Mother   . Arthritis Mother   . Hypertension Mother   . Breast cancer Maternal Aunt     in her 72's   . Breast cancer Cousin    History  Substance Use Topics  . Smoking status: Never Smoker   . Smokeless tobacco: Never Used  . Alcohol Use: Yes     Comment: rare   OB History   Grav Para Term Preterm Abortions TAB SAB Ect Mult Living   2 2 2       2      Review of Systems  Constitutional: Negative for fever and chills.  Gastrointestinal: Positive for nausea.  Genitourinary: Negative for urgency.  Skin: Negative for rash.    Allergies  Other; Methimazole; Nitroglycerin; Terbutaline; and Tetracycline hcl  Home Medications   Prior to Admission medications   Medication Sig Start Date End Date Taking? Authorizing Provider  ferrous sulfate 325 (65 FE) MG tablet Take 325 mg by mouth daily.    Historical Provider, MD  guaiFENesin-codeine (CHERATUSSIN AC) 100-10 MG/5ML syrup Take 5-10 mLs by mouth at bedtime. 07/14/13   Rosalita Chessman, DO  ibuprofen (  ADVIL,MOTRIN) 600 MG tablet 1 po pc every 6 hours for 5 days then prn-pain 12/11/12   Earnstine Regal, PA-C  loratadine (CLARITIN) 10 MG tablet Take 1 tablet (10 mg total) by mouth daily. 07/13/13   Rosalita Chessman, DO  omeprazole (PRILOSEC) 20 MG capsule Take 1 capsule (20 mg total) by mouth daily. 07/13/13   Rosalita Chessman, DO  predniSONE (DELTASONE) 10 MG tablet 3 po qd for 3 days then 2 po qd for 3 days the 1 po qd for 3 days 07/13/13   Rosalita Chessman, DO   Triage Vitals: BP 132/82  Pulse 79  Temp(Src) 98.8 F (37.1 C) (Oral)  Resp 16  Ht 5\' 3"  (1.6 m)  Wt 175 lb (79.379 kg)  BMI 31.01 kg/m2  SpO2 95%  LMP 12/03/2012  Physical Exam  Nursing note and vitals reviewed. Constitutional: She is oriented to person, place, and time. She appears well-developed and well-nourished.  HENT:   Head: Normocephalic and atraumatic.  Eyes: EOM are normal.  Neck: Neck supple.  Cardiovascular: Normal rate.   Pulmonary/Chest: Effort normal.  Musculoskeletal: Normal range of motion.  Neurological: She is alert and oriented to person, place, and time.  Skin: Skin is warm and dry.  Herpetic appearing lesion to right flank.  Psychiatric: She has a normal mood and affect. Her behavior is normal.    ED Course  Procedures (including critical care time)  COORDINATION OF CARE: 9:12 PM- Discussed treatment plan with patient at bedside and patient agreed to plan.   Labs Review Labs Reviewed - No data to display  Imaging Review No results found.   EKG Interpretation None     Rash consistent with zoster reactivation.  Valtrex prescription provided. MDM   Final diagnoses:  None    Shingles.  I personally performed the services described in this documentation, which was scribed in my presence. The recorded information has been reviewed and is accurate.    Norman Herrlich, NP 12/22/13 [phone]

## 2013-12-21 NOTE — ED Notes (Signed)
The patient said she started having what she thought was insect bites on Sunday.  Today she said the "stinging and itching" has gotten worse so she decided to come in.

## 2013-12-21 NOTE — Discharge Instructions (Signed)

## 2013-12-22 NOTE — ED Notes (Signed)
Valtrex given

## 2013-12-22 NOTE — ED Provider Notes (Signed)
Medical screening examination/treatment/procedure(s) were performed by non-physician practitioner and as supervising physician I was immediately available for consultation/collaboration.   EKG Interpretation None        Wandra Arthurs, MD 12/22/13 1016

## 2013-12-22 NOTE — ED Provider Notes (Signed)
Patient returned to ED after running into an issue filling her valtrex prescription at the pharmacy.  Pharmacy will not be able to address the issue until the morning, when patient will be outside of the 72 hour window of symptom onset.  Patient received initial dose of valtrex during this return visit. If patient continues to have difficulty with the valtrex prescription due to insurance issues, she will re-contact the ED. Please direct to the flow manager-- I would recommend changing to acyclovir 800 mg five times daily for 7 days.  Acyclovir is available on the low cost generic list  Norman Herrlich, NP 12/22/13 [phone]

## 2013-12-30 ENCOUNTER — Ambulatory Visit (INDEPENDENT_AMBULATORY_CARE_PROVIDER_SITE_OTHER): Payer: No Typology Code available for payment source | Admitting: Internal Medicine

## 2013-12-30 ENCOUNTER — Encounter: Payer: Self-pay | Admitting: Internal Medicine

## 2013-12-30 VITALS — BP 122/78 | HR 84 | Temp 98.2°F | Wt 181.2 lb

## 2013-12-30 DIAGNOSIS — M069 Rheumatoid arthritis, unspecified: Secondary | ICD-10-CM

## 2013-12-30 DIAGNOSIS — Z293 Encounter for prophylactic fluoride administration: Secondary | ICD-10-CM

## 2013-12-30 DIAGNOSIS — E119 Type 2 diabetes mellitus without complications: Secondary | ICD-10-CM

## 2013-12-30 DIAGNOSIS — B029 Zoster without complications: Secondary | ICD-10-CM

## 2013-12-30 NOTE — Patient Instructions (Signed)
Your shingles has improved, and you are no longer infectious to other persons.  Please finish the valtrex as you are doing  You can also take advil or tylenol as needed for pain  Please continue all other medications as before, and refills have been done if requested.  Please have the pharmacy call with any other refills you may need.

## 2013-12-30 NOTE — Progress Notes (Signed)
Subjective:    Patient ID: April Gonzalez, female    DOB: [birth year], 53 y.o.   MRN: [id number]  HPI  Here to f/u, seen in ER aug 18 with rash c/w shingles to right flank; tx with valtrex, with overall rash now improved now crusting, and less discomfort and tenderness. Pt denies chest pain, increased sob or doe, wheezing, orthopnea, PND, increased LE swelling, palpitations, dizziness or syncope.   Pt denies polydipsia, polyuria, Pt denies new neurological symptoms such as new headache, or facial or extremity weakness or numbness  No apparent inciting event, no worsening RA symptoms or joint pain.  Due for flu shot. Past Medical History  Diagnosis Date  . Seizures     last seizure age 32 or 10 per pt report  . RA (rheumatoid arthritis)     states was dx at age 47 but not on meds  . H/O uterine prolapse   . Thyroid disorder   . Fibroids     H/O myomectomy  . Anemia 05/2012    transfusion  . Heart murmur   . History of blood transfusion    Past Surgical History  Procedure Laterality Date  . Excision vaginal cyst    . Tubal ligation  10/10/1991  . Myomectomy  36YRS AGO    HYSTEROSCOPIC  . Vaginal hysterectomy N/A 12/10/2012    Procedure: Total Vaginal Hysterectomy;  Surgeon: Eldred Manges, MD;  Location: [facility];  Service: Gynecology;  Laterality: N/A;  . Cystocele repair N/A 12/10/2012    Procedure: ANTERIOR REPAIR (CYSTOCELE);  Surgeon: Eldred Manges, MD;  Location: [facility];  Service: Gynecology;  Laterality: N/A;  . Bladder suspension N/A 12/10/2012    Procedure: TRANSVAGINAL TAPE (TVT) PROCEDURE;  Surgeon: Delice Lesch, MD;  Location: [facility];  Service: Gynecology;  Laterality: N/A;  . Cystoscopy N/A 12/10/2012    Procedure: CYSTOSCOPY;  Surgeon: Delice Lesch, MD;  Location: [facility];  Service: Gynecology;  Laterality: N/A;    reports that she has never smoked. She has never used smokeless tobacco. She reports that she drinks alcohol. She reports that she does not use illicit  drugs. family history includes Arthritis in her mother; Breast cancer in her cousin and maternal aunt; Diabetes in her mother; Heart failure in her father; High blood pressure in her mother; Hypertension in her mother. Allergies  Allergen Reactions  . Other     Seasonal / dog hair   . Methimazole     REACTION: anaphylaxis  . Nitroglycerin     REACTION: sweating  . Terbutaline   . Tetracycline Hcl     REACTION: elevated blood pressure   Current Outpatient Prescriptions on File Prior to Visit  Medication Sig Dispense Refill  . diphenhydrAMINE (SOMINEX) 25 MG tablet Take 25 mg by mouth daily as needed for itching or sleep.       No current facility-administered medications on file prior to visit.     Review of Systems  Constitutional: Negative for unusual diaphoresis or other sweats  HENT: Negative for ringing in ear Eyes: Negative for double vision or worsening visual disturbance.  Respiratory: Negative for choking and stridor.   Gastrointestinal: Negative for vomiting or other signifcant bowel change Genitourinary: Negative for hematuria or decreased urine volume.  Musculoskeletal: Negative for other MSK pain or swelling Skin: Negative for color change and worsening wound.  Neurological: Negative for tremors and numbness other than noted  Psychiatric/Behavioral: Negative for decreased concentration or agitation other than above  Objective:   Physical Exam Blood pressure 122/78, pulse 84, temperature 98.2 F (36.8 C), temperature source Oral, weight 181 lb 4 oz (82.214 kg), last menstrual period 12/03/2012, SpO2 94.00%. s VS noted,  Constitutional: Pt appears well-developed, well-nourished.  HENT: Head: NCAT.  Right Ear: External ear normal.  Left Ear: External ear normal.  Eyes: . Pupils are equal, round, and reactive to light. Conjunctivae and EOM are normal Neck: Normal range of motion. Neck supple.  Cardiovascular: Normal rate and regular rhythm.    Pulmonary/Chest: Effort normal and breath sounds normal.  Abd:  Soft, NT, ND, + BS Neurological: Pt is alert. Not confused , motor grossly intact Skin: Skin is warm. Right flank with 3 cm area small crusting vesicles grouped in erythema, mild tender, no drainage or cellulitis No acute synovitis Psychiatric: Pt behavior is normal. No agitation.     Assessment & Plan:

## 2013-12-30 NOTE — Progress Notes (Signed)
Pre visit review using our clinic review tool, if applicable. No additional management support is needed unless otherwise documented below in the visit note. 

## 2014-01-01 ENCOUNTER — Encounter (HOSPITAL_COMMUNITY): Payer: Self-pay | Admitting: Emergency Medicine

## 2014-01-01 ENCOUNTER — Emergency Department (HOSPITAL_COMMUNITY)
Admission: EM | Admit: 2014-01-01 | Discharge: 2014-01-01 | Disposition: A | Discharge status: Home / Self Care | Payer: No Typology Code available for payment source | Attending: Emergency Medicine | Admitting: Emergency Medicine

## 2014-01-01 DIAGNOSIS — Z8739 Personal history of other diseases of the musculoskeletal system and connective tissue: Secondary | ICD-10-CM | POA: Diagnosis not present

## 2014-01-01 DIAGNOSIS — Z8639 Personal history of other endocrine, nutritional and metabolic disease: Secondary | ICD-10-CM | POA: Insufficient documentation

## 2014-01-01 DIAGNOSIS — Z862 Personal history of diseases of the blood and blood-forming organs and certain disorders involving the immune mechanism: Secondary | ICD-10-CM | POA: Insufficient documentation

## 2014-01-01 DIAGNOSIS — Z8669 Personal history of other diseases of the nervous system and sense organs: Secondary | ICD-10-CM | POA: Diagnosis not present

## 2014-01-01 DIAGNOSIS — T50B95A Adverse effect of other viral vaccines, initial encounter: Secondary | ICD-10-CM

## 2014-01-01 DIAGNOSIS — M79609 Pain in unspecified limb: Secondary | ICD-10-CM | POA: Diagnosis present

## 2014-01-01 DIAGNOSIS — T50Z95A Adverse effect of other vaccines and biological substances, initial encounter: Secondary | ICD-10-CM | POA: Insufficient documentation

## 2014-01-01 MED ORDER — HYDROCODONE-ACETAMINOPHEN 5-325 MG PO TABS: ORAL_TABLET | ORAL | Status: DC

## 2014-01-01 MED ORDER — ACETAMINOPHEN 325 MG PO TABS
650.0000 mg | ORAL_TABLET | Freq: Once | ORAL | Status: AC
  Administered 2014-01-01: 650 mg via ORAL
  Filled 2014-01-01: qty 2

## 2014-01-01 MED ORDER — PREDNISONE 20 MG PO TABS: ORAL_TABLET | ORAL | Status: DC

## 2014-01-01 MED ORDER — MELOXICAM 15 MG PO TABS: 15.0000 mg | ORAL_TABLET | Freq: Every day | ORAL | Status: DC

## 2014-01-01 NOTE — ED Notes (Signed)
She got the flu shot in her R arm on Thursday and she is concerned because shes had increasing pain at the site since. She states "it feels like theres a knot under my skin" on R upper chest above R clavicle

## 2014-01-01 NOTE — Discharge Instructions (Signed)
Rest and apply ice.  Return if no better in 1 week or if any fever.

## 2014-01-01 NOTE — ED Provider Notes (Signed)
Chief Complaint   Chief Complaint  Patient presents with  . Arm Pain    History of Present Illness   April Gonzalez is a 53 year old female who has had shoulder pain since a flu vaccine this past Thursday, 3 days ago. She received her normal influenza vaccine which was her first ever in the right deltoid. Immediately afterward she noted some swelling, erythema, and heat. It was sore to touch. Eventually the swelling seemed to spread to the collarbone area, she felt a knot in this area, the pain and swelling then spread to the left shoulder, down the arm into the wrist, and even into the face. She denies any rash or itching. She's had no fever, chills, or headache. She denies any stiff neck. Her neck and her shoulder both have full range of motion. She denies any chest pain or shortness of breath. She's had no difficulty breathing.  Review of Systems   Other than as noted above, the patient denies any of the following symptoms: Systemic:  No fevers or chills. Musculoskeletal:  No joint pain, arthritis, swelling, back pain, or neck pain. No history of arthritis.  Neurological:  No muscular weakness or paresthesia.  [place]   Past medical history, family history, social history, meds, and allergies were reviewed.  She has a list of allergies including methimazole, nitroglycerin, terbutaline, and tetracycline. She has a history of seizures, rheumatoid arthritis, thyroid disorder, and anemia.  Physical Examination     Vital signs:  BP 134/82  Pulse 84  Temp(Src) 97.9 F (36.6 C) (Oral)  Resp 16  Ht 5\' 3"  (1.6 m)  Wt 180 lb (81.647 kg)  BMI 31.89 kg/m2  SpO2 97%  LMP 12/03/2012 Gen:  Alert and oriented times 3.  In no distress. Musculoskeletal: There is pain to palpation over the deltoid, but no obvious swelling, redness, or heat. The shoulder had a full range of motion with no pain. There was slight swelling over the collarbone area and tenderness to palpation in the supraclavicular fossa  both on the right and on the left. The left shoulder was mildly tender to palpation, had a full range of motion with some pain on abduction. Her neck was tender as was her jaw area but there was no obvious swelling or rash. She had no swelling on down her arm, but her right wrist was tender to palpation. Otherwise, all joints had a full a ROM with no swelling, bruising or deformity.  No edema, pulses full. Lungs: Clear to auscultation. Heart: Regular rhythm, no gallop or murmur. Extremities were warm and pink.  Capillary refill was brisk.  Skin:  Clear, warm and dry.  No rash. Neuro:  Alert and oriented times 3.  Muscle strength was normal.  Sensation was intact to light touch.   Assessment   The encounter diagnosis was Local reaction to influenza vaccine, initial encounter.  This appears to be a local reaction to her influenza vaccine. No evidence of systemic reaction.  Plan     1.  Meds:  The following meds were prescribed:   Discharge Medication List as of 01/01/2014 12:41 PM    START taking these medications   Details  HYDROcodone-acetaminophen (NORCO/VICODIN) 5-325 MG per tablet 1 to 2 tabs every 4 to 6 hours as needed for pain., Print    meloxicam (MOBIC) 15 MG tablet Take 1 tablet (15 mg total) by mouth daily., Starting 01/01/2014, Until Discontinued, Print    predniSONE (DELTASONE) 20 MG tablet Take 3 daily for 5 days,  2 daily for 5 days, 1 daily for 5 days., Print        2.  Patient Education/Counseling:  The patient was given appropriate handouts, self care instructions, and instructed in symptomatic relief.  Suggested rest to the shoulder, but range of motion exercises daily, ice, and return if she should get worse in any way.  3.  Follow up:  The patient was told to follow up here if no better in 3 to 4 days, or sooner if becoming worse in any way, and given some red flag symptoms such as worsening pain or new neurological symptoms which would prompt immediate return.        Harden Mo, MD 01/01/14 2130

## 2014-01-06 DIAGNOSIS — B029 Zoster without complications: Secondary | ICD-10-CM | POA: Insufficient documentation

## 2014-01-06 NOTE — Assessment & Plan Note (Signed)
Improved, to finish valtrex, and advil prn, only, declines need for PHn tx such as gabapentin

## 2014-01-06 NOTE — Assessment & Plan Note (Signed)
stable overall by history and exam, and pt to continue medical treatment as before,  to f/u any worsening symptoms or concerns 

## 2014-01-06 NOTE — Assessment & Plan Note (Signed)
Lab Results  Component Value Date   HGBA1C 5.5 08/04/2012   No recent f/u, declines repeat a1c today, for cont'd efforts at diet, wt loss, f/u next visit

## 2014-02-03 ENCOUNTER — Ambulatory Visit: Payer: Self-pay | Admitting: Family Medicine

## 2014-02-03 ENCOUNTER — Encounter: Payer: Self-pay | Admitting: Family Medicine

## 2014-02-03 ENCOUNTER — Ambulatory Visit: Payer: No Typology Code available for payment source | Admitting: Family Medicine

## 2014-02-03 ENCOUNTER — Telehealth: Payer: Self-pay | Admitting: Family Medicine

## 2014-02-03 ENCOUNTER — Ambulatory Visit (INDEPENDENT_AMBULATORY_CARE_PROVIDER_SITE_OTHER): Payer: No Typology Code available for payment source | Admitting: Family Medicine

## 2014-02-03 VITALS — BP 134/80 | HR 68 | Temp 97.7°F | Wt 181.0 lb

## 2014-02-03 DIAGNOSIS — J0100 Acute maxillary sinusitis, unspecified: Secondary | ICD-10-CM

## 2014-02-03 DIAGNOSIS — J209 Acute bronchitis, unspecified: Secondary | ICD-10-CM

## 2014-02-03 MED ORDER — HYDROCODONE-HOMATROPINE 5-1.5 MG/5ML PO SYRP: 5.0000 mL | ORAL_SOLUTION | Freq: Four times a day (QID) | ORAL | Status: AC | PRN

## 2014-02-03 MED ORDER — AMOXICILLIN 875 MG PO TABS: 875.0000 mg | ORAL_TABLET | Freq: Two times a day (BID) | ORAL | Status: DC

## 2014-02-03 NOTE — Telephone Encounter (Signed)
Patient report

## 2014-02-03 NOTE — Telephone Encounter (Signed)
Patient Information:  Caller Name: Zahriah  Phone: [phone]  Patient: April Gonzalez  Gender: Female  DOB: [birth year]  Age: 53 Years  PCP: Stevie Kern [facility])  Pregnant: No  Office Follow Up:  Does the office need to follow up with this patient?: No  Instructions For The Office: N/A  RN Note:  Patient calling regarding cough and mild hoarseness.  Afebrile.  She also states that she has a salty taste after coughing.  Symptoms  Reason For Call & Symptoms: cough congestion  Reviewed Health History In EMR: Yes  Reviewed Medications In EMR: Yes  Reviewed Allergies In EMR: Yes  Reviewed Surgeries / Procedures: Yes  Date of Onset of Symptoms: 02/03/2014 OB / GYN:  LMP: Unknown  Guideline(s) Used:  Colds  Cough  Disposition Per Guideline:   See Today or Tomorrow in Office  Reason For Disposition Reached:   Continuous (nonstop) coughing interferes with work or school and no improvement using cough treatment per Care Advice  Advice Given:  Coughing Spasms:  Drink warm fluids. Inhale warm mist (Reason: both relax the airway and loosen up the phlegm).  Suck on cough drops or hard candy to coat the irritated throat.  Prevent Dehydration:  Drink adequate liquids.  This will help soothe an irritated or dry throat and loosen up the phlegm.  Call Back If:  Difficulty breathing  Patient Will Follow Care Advice:  YES  Appointment Scheduled:  02/03/2014 12:00:00 Appointment Scheduled Provider:  Stevie Kern [facility])

## 2014-02-03 NOTE — Progress Notes (Signed)
Pre visit review using our clinic review tool, if applicable. No additional management support is needed unless otherwise documented below in the visit note. 

## 2014-02-03 NOTE — Telephone Encounter (Signed)
Noted  

## 2014-02-03 NOTE — Patient Instructions (Signed)

## 2014-02-03 NOTE — Telephone Encounter (Signed)
Appt rescheduled with Dr. Elease Hashimoto at 11:45, [name] to notify pt.

## 2014-02-03 NOTE — Progress Notes (Signed)
Subjective:    Patient ID: April Gonzalez, female    DOB: [birth year], 53 y.o.   MRN: [id number]  Cough Pertinent negatives include no chest pain, chills or fever.   Patient is seen for evaluation of three-week history of cough. Nonsmoker. No history of asthma or chronic lung issues. She's had cough productive of yellow to occasionally clear sputum during this time.  No hemoptysis. No dyspnea. No wheezing. Cough especially bothersome at night. She's had frequent maxillary facial pain and pressure and greenish drainage over the past 3 weeks. She's tried over-the-counter Robitussin with minimal relief. No recent appetite or weight changes.  Past Medical History  Diagnosis Date  . Seizures     last seizure age 41 or 10 per pt report  . RA (rheumatoid arthritis)     states was dx at age 62 but not on meds  . H/O uterine prolapse   . Thyroid disorder   . Fibroids     H/O myomectomy  . Anemia 05/2012    transfusion  . Heart murmur   . History of blood transfusion    Past Surgical History  Procedure Laterality Date  . Excision vaginal cyst    . Tubal ligation  10/10/1991  . Myomectomy  68YRS AGO    HYSTEROSCOPIC  . Vaginal hysterectomy N/A 12/10/2012    Procedure: Total Vaginal Hysterectomy;  Surgeon: Eldred Manges, MD;  Location: [facility];  Service: Gynecology;  Laterality: N/A;  . Cystocele repair N/A 12/10/2012    Procedure: ANTERIOR REPAIR (CYSTOCELE);  Surgeon: Eldred Manges, MD;  Location: [facility];  Service: Gynecology;  Laterality: N/A;  . Bladder suspension N/A 12/10/2012    Procedure: TRANSVAGINAL TAPE (TVT) PROCEDURE;  Surgeon: Delice Lesch, MD;  Location: [place] [facility];  Service: Gynecology;  Laterality: N/A;  . Cystoscopy N/A 12/10/2012    Procedure: CYSTOSCOPY;  Surgeon: Delice Lesch, MD;  Location: [facility];  Service: Gynecology;  Laterality: N/A;    reports that she has never smoked. She has never used smokeless tobacco. She reports that she drinks alcohol. She reports that she  does not use illicit drugs. family history includes Arthritis in her mother; Breast cancer in her cousin and maternal aunt; Diabetes in her mother; Heart failure in her father; High blood pressure in her mother; Hypertension in her mother. Allergies  Allergen Reactions  . Other     Seasonal / dog hair   . Methimazole     REACTION: anaphylaxis  . Nitroglycerin     REACTION: sweating  . Terbutaline   . Tetracycline Hcl     REACTION: elevated blood pressure      Review of Systems  Constitutional: Negative for fever and chills.  HENT: Positive for congestion and sinus pressure.   Respiratory: Positive for cough.   Cardiovascular: Negative for chest pain.       Objective:   Physical Exam  Constitutional: She appears well-developed and well-nourished. No distress.  HENT:  Right Ear: External ear normal.  Left Ear: External ear normal.  Mouth/Throat: Oropharynx is clear and moist.  Neck: Neck supple.  Cardiovascular: Normal rate and regular rhythm.   Pulmonary/Chest: Effort normal and breath sounds normal. No respiratory distress. She has no wheezes. She has no rales.  Lymphadenopathy:    She has no cervical adenopathy.          Assessment & Plan:  Persistent cough. Question acute bilateral maxillary sinusitis. Given duration, amoxicillin 875 mg twice a day for 10 days. Hycodan  cough syrup 1 teaspoon each bedtime for severe cough. Followup with primary in one to 2 weeks if not improving

## 2014-03-07 ENCOUNTER — Encounter: Payer: Self-pay | Admitting: Family Medicine

## 2014-03-21 ENCOUNTER — Ambulatory Visit: Payer: No Typology Code available for payment source | Admitting: Internal Medicine

## 2014-06-17 ENCOUNTER — Emergency Department (INDEPENDENT_AMBULATORY_CARE_PROVIDER_SITE_OTHER)
Admission: EM | Admit: 2014-06-17 | Discharge: 2014-06-17 | Disposition: A | Discharge status: Home / Self Care | Payer: 59 | Source: Home / Self Care | Attending: Family Medicine | Admitting: Family Medicine

## 2014-06-17 ENCOUNTER — Encounter: Payer: Self-pay | Admitting: Internal Medicine

## 2014-06-17 ENCOUNTER — Encounter (HOSPITAL_COMMUNITY): Payer: Self-pay | Admitting: *Deleted

## 2014-06-17 ENCOUNTER — Ambulatory Visit (INDEPENDENT_AMBULATORY_CARE_PROVIDER_SITE_OTHER): Payer: 59 | Admitting: Internal Medicine

## 2014-06-17 VITALS — BP 150/100 | HR 94 | Temp 98.5°F | Resp 15 | Ht 63.0 in | Wt 187.0 lb

## 2014-06-17 DIAGNOSIS — R220 Localized swelling, mass and lump, head: Secondary | ICD-10-CM

## 2014-06-17 DIAGNOSIS — J0100 Acute maxillary sinusitis, unspecified: Secondary | ICD-10-CM

## 2014-06-17 DIAGNOSIS — J00 Acute nasopharyngitis [common cold]: Secondary | ICD-10-CM

## 2014-06-17 MED ORDER — AMOXICILLIN 500 MG PO CAPS: 500.0000 mg | ORAL_CAPSULE | Freq: Three times a day (TID) | ORAL | Status: DC

## 2014-06-17 MED ORDER — PREDNISONE 20 MG PO TABS: 20.0000 mg | ORAL_TABLET | Freq: Two times a day (BID) | ORAL | Status: DC

## 2014-06-17 MED ORDER — IPRATROPIUM BROMIDE 0.06 % NA SOLN: 2.0000 | Freq: Four times a day (QID) | NASAL | Status: DC

## 2014-06-17 NOTE — Progress Notes (Signed)
   Subjective:    Patient ID: April Gonzalez, female    DOB: 05-Oct-1960, 54 y.o.   MRN: [id number]  HPI  Her symptoms began 06/07/14 as sneezing, postnasal drainage, and cough related to the drainage. As of 62/5/16 she felt well enough to go to [place]. When she returned 06/12/14 she had malaise.  As of 2/8 the rhinitis had recurred. Yesterday evening she noticed swelling of her face and possibly of her tongue w/o specific trigger.She is not on an ACE inhibitor or ARB. She also had bilateral ear pain; pain in the maxillary sinuses,  yellow nasal discharge ,sore throat,dental pain,& low grade fever.  Review of Systems Frontal headache, otic discharge denied. No sputum production, dyspnea, or wheezing present.     Objective:   Physical Exam  Pertinent or positive findings include: Slight facial swelling is suggested. Pupils are equal round reactive to light. Extraocular motion is intact. She has no conjunctivitis. Vision is normal. She does have increased cerumen in otic canals partially tutoring the tympanic membranes. There is marked erythema of the nares. There is no oropharyngeal diameter compromise.Tongue clinically not swollen. There is no stridor with hyperventilation.  General appearance:Adequately nourished; no acute distress or increased work of breathing is present.  No  lymphadenopathy about the head, neck, or axilla noted.  Eyes: No scleral icterus. Ears:  External ear exam shows no significant lesions or deformities.  Visualized tympanic membranes are intact bilaterally without bulging, retraction, inflammation or discharge. Nose:  External nasal examination shows no deformity or inflammation. No septal dislocation or deviation.No obstruction to airflow.  Oral exam: Dental hygiene is good; lips and gums are healthy appearing.There is no oropharyngeal erythema or exudate noted.  Neck:  No deformities, thyromegaly, masses, or tenderness noted.   Supple with full range of motion  without pain.  Heart:  Normal rate and regular rhythm. S1 and S2 normal without gallop, murmur, click, rub or other extra sounds.  Lungs:Chest clear to auscultation; no wheezes, rhonchi,rales ,or rubs present. Extremities:  No cyanosis, edema, or clubbing  noted  Skin: Warm & dry w/o  tenting.       Assessment & Plan:  #1 acute maxillary sinusitis. Clinically no evidence of associated conjunctivitis or periorbital cellulitis.  #2 facial and tongue swelling most likely related to #1. No oropharyngeal or airway compromise. Plan: See orders and after visit summary. Warning signs were discussed in detail.

## 2014-06-17 NOTE — ED Notes (Signed)
Pt  Reports        Runny  Nose     Swollen     Face  And  Hands  As   Well          Congested             Sinus  Drainage           With  Symptoms  For  Several  Days

## 2014-06-17 NOTE — ED Provider Notes (Signed)
CSN: [id number]     Arrival date & time 06/17/14  1105 History   First MD Initiated Contact with Patient 06/17/14 1128     Chief Complaint  Patient presents with  . URI   (Consider location/radiation/quality/duration/timing/severity/associated sxs/prior Treatment) Patient is a 54 y.o. female presenting with URI. The history is provided by the patient.  URI Presenting symptoms: congestion, cough and rhinorrhea   Presenting symptoms: no fever and no sore throat   Presenting symptoms comment:  +post nasal drainage Severity:  Mild Onset quality:  Gradual Duration:  3 days Progression:  Unchanged Chronicity:  New Relieved by:  OTC medications (DayQuil and Goody Powders) Associated symptoms: swollen glands     Past Medical History  Diagnosis Date  . Seizures     last seizure age 63 or 10 per pt report  . RA (rheumatoid arthritis)     states was dx at age 3 but not on meds  . H/O uterine prolapse   . Thyroid disorder   . Fibroids     H/O myomectomy  . Anemia 05/2012    transfusion  . Heart murmur   . History of blood transfusion    Past Surgical History  Procedure Laterality Date  . Excision vaginal cyst    . Tubal ligation  10/10/1991  . Myomectomy  69YRS AGO    HYSTEROSCOPIC  . Vaginal hysterectomy N/A 12/10/2012    Procedure: Total Vaginal Hysterectomy;  Surgeon: Eldred Manges, MD;  Location: [facility];  Service: Gynecology;  Laterality: N/A;  . Cystocele repair N/A 12/10/2012    Procedure: ANTERIOR REPAIR (CYSTOCELE);  Surgeon: Eldred Manges, MD;  Location: [place];  Service: Gynecology;  Laterality: N/A;  . Bladder suspension N/A 12/10/2012    Procedure: TRANSVAGINAL TAPE (TVT) PROCEDURE;  Surgeon: Delice Lesch, MD;  Location: [facility];  Service: Gynecology;  Laterality: N/A;  . Cystoscopy N/A 12/10/2012    Procedure: CYSTOSCOPY;  Surgeon: Delice Lesch, MD;  Location: [place] [facility];  Service: Gynecology;  Laterality: N/A;   Family History  Problem Relation Age of Onset  .  Heart failure Father   . Diabetes Mother   . High blood pressure Mother   . Arthritis Mother   . Hypertension Mother   . Breast cancer Maternal Aunt     in her 91's   . Breast cancer Cousin    History  Substance Use Topics  . Smoking status: Never Smoker   . Smokeless tobacco: Never Used  . Alcohol Use: Yes     Comment: rare   OB History    Gravida Para Term Preterm AB TAB SAB Ectopic Multiple Living   2 2 2       2      Review of Systems  Constitutional: Negative for fever.  HENT: Positive for congestion, postnasal drip and rhinorrhea. Negative for sore throat.        +bilateral ear congestion   Eyes: Negative.   Respiratory: Positive for cough.   Cardiovascular: Negative.   Gastrointestinal: Negative.   Musculoskeletal: Negative.   Skin: Negative.     Allergies  Other; Methimazole; Nitroglycerin; Terbutaline; and Tetracycline hcl  Home Medications   Prior to Admission medications   Medication Sig Start Date End Date Taking? Authorizing Provider  amoxicillin (AMOXIL) 875 MG tablet Take 1 tablet (875 mg total) by mouth 2 (two) times daily. 02/03/14   Eulas Post, MD  diphenhydrAMINE (SOMINEX) 25 MG tablet Take 25 mg by mouth daily as needed for  itching or sleep.    Historical Provider, MD  HYDROcodone-acetaminophen (NORCO/VICODIN) 5-325 MG per tablet 1 to 2 tabs every 4 to 6 hours as needed for pain. 01/01/14   Harden Mo, MD  ipratropium (ATROVENT) 0.06 % nasal spray Place 2 sprays into both nostrils 4 (four) times daily. For nasal congestion 06/17/14   Audelia Hives Lakeshia Dohner, PA  meloxicam (MOBIC) 15 MG tablet Take 1 tablet (15 mg total) by mouth daily. 01/01/14   Harden Mo, MD   BP 147/89 mmHg  Pulse 66  Temp(Src) 98.2 F (36.8 C) (Oral)  Resp 20  SpO2 97%  LMP 12/03/2012 Physical Exam  Constitutional: She is oriented to person, place, and time. She appears well-developed and well-nourished. No distress.  HENT:  Head: Normocephalic and atraumatic.   Right Ear: Hearing, tympanic membrane, external ear and ear canal normal.  Left Ear: Hearing, tympanic membrane, external ear and ear canal normal.  Nose: Nose normal.  Mouth/Throat: Uvula is midline, oropharynx is clear and moist and mucous membranes are normal.  Eyes: Conjunctivae are normal. No scleral icterus.  Neck: Normal range of motion. Neck supple.  Cardiovascular: Normal rate, regular rhythm and normal heart sounds.   Pulmonary/Chest: Effort normal and breath sounds normal.  Musculoskeletal: Normal range of motion.  Lymphadenopathy:    She has no cervical adenopathy.  Neurological: She is alert and oriented to person, place, and time.  Skin: Skin is warm and dry.  Psychiatric: She has a normal mood and affect. Her behavior is normal.  Nursing note and vitals reviewed.   ED Course  Procedures (including critical care time) Labs Review Labs Reviewed - No data to display  Imaging Review No results found.   MDM   1. Common cold   Atrovent for nasal congestion and continued symptomatic care at home. Fluids, rest Advised to expect improvement over next several days    Lutricia Feil, PA 06/17/14 1253

## 2014-06-17 NOTE — Progress Notes (Signed)
Pre visit review using our clinic review tool, if applicable. No additional management support is needed unless otherwise documented below in the visit note. 

## 2014-06-17 NOTE — Patient Instructions (Addendum)
Plain Mucinex (NOT D) for thick secretions ;force NON dairy fluids .   Nasal cleansing in the shower as discussed with lather of mild shampoo.After 10 seconds wash off lather while  exhaling through nostrils. Make sure that all residual soap is removed to prevent irritation.  Flonase OR Nasacort AQ 1 spray in each nostril twice a day as needed. Use the "crossover" technique into opposite nostril spraying toward opposite ear @ 45 degree angle, not straight up into nostril.  Plain Allegra (NOT D )  160 daily , Loratidine 10 mg , OR Zyrtec 10 mg @ bedtime  as needed for itchy eyes & sneezing.  Avoid perfumes and cosmetics which are not hypoallergenic. Restrict hyperallergenic foods at this time: Nuts, strawberries, seafood , chocolate, and tomatoes.  If you have swelling of the tongue or throat; he should go to the emergency room immediately.  Also pain in the eyes, vision change or purulent secretions (pus) from the eyes require an ER visit.

## 2014-06-17 NOTE — Discharge Instructions (Signed)
Upper Respiratory Infection, Adult An upper respiratory infection (URI) is also sometimes known as the common cold. The upper respiratory tract includes the nose, sinuses, throat, trachea, and bronchi. Bronchi are the airways leading to the lungs. Most people improve within 1 week, but symptoms can last up to 2 weeks. A residual cough may last even longer.  CAUSES Many different viruses can infect the tissues lining the upper respiratory tract. The tissues become irritated and inflamed and often become very moist. Mucus production is also common. A cold is contagious. You can easily spread the virus to others by oral contact. This includes kissing, sharing a glass, coughing, or sneezing. Touching your mouth or nose and then touching a surface, which is then touched by another person, can also spread the virus. SYMPTOMS  Symptoms typically develop 1 to 3 days after you come in contact with a cold virus. Symptoms vary from person to person. They may include:  Runny nose.  Sneezing.  Nasal congestion.  Sinus irritation.  Sore throat.  Loss of voice (laryngitis).  Cough.  Fatigue.  Muscle aches.  Loss of appetite.  Headache.  Low-grade fever. DIAGNOSIS  You might diagnose your own cold based on familiar symptoms, since most people get a cold 2 to 3 times a year. Your caregiver can confirm this based on your exam. Most importantly, your caregiver can check that your symptoms are not due to another disease such as strep throat, sinusitis, pneumonia, asthma, or epiglottitis. Blood tests, throat tests, and X-rays are not necessary to diagnose a common cold, but they may sometimes be helpful in excluding other more serious diseases. Your caregiver will decide if any further tests are required. RISKS AND COMPLICATIONS  You may be at risk for a more severe case of the common cold if you smoke cigarettes, have chronic heart disease (such as heart failure) or lung disease (such as asthma), or if  you have a weakened immune system. The very young and very old are also at risk for more serious infections. Bacterial sinusitis, middle ear infections, and bacterial pneumonia can complicate the common cold. The common cold can worsen asthma and chronic obstructive pulmonary disease (COPD). Sometimes, these complications can require emergency medical care and may be life-threatening. PREVENTION  The best way to protect against getting a cold is to practice good hygiene. Avoid oral or hand contact with people with cold symptoms. Wash your hands often if contact occurs. There is no clear evidence that vitamin C, vitamin E, echinacea, or exercise reduces the chance of developing a cold. However, it is always recommended to get plenty of rest and practice good nutrition. TREATMENT  Treatment is directed at relieving symptoms. There is no cure. Antibiotics are not effective, because the infection is caused by a virus, not by bacteria. Treatment may include:  Increased fluid intake. Sports drinks offer valuable electrolytes, sugars, and fluids.  Breathing heated mist or steam (vaporizer or shower).  Eating chicken soup or other clear broths, and maintaining good nutrition.  Getting plenty of rest.  Using gargles or lozenges for comfort.  Controlling fevers with ibuprofen or acetaminophen as directed by your caregiver.  Increasing usage of your inhaler if you have asthma. Zinc gel and zinc lozenges, taken in the first 24 hours of the common cold, can shorten the duration and lessen the severity of symptoms. Pain medicines may help with fever, muscle aches, and throat pain. A variety of non-prescription medicines are available to treat congestion and runny nose. Your caregiver   can make recommendations and may suggest nasal or lung inhalers for other symptoms.  HOME CARE INSTRUCTIONS   Only take over-the-counter or prescription medicines for pain, discomfort, or fever as directed by your  caregiver.  Use a warm mist humidifier or inhale steam from a shower to increase air moisture. This may keep secretions moist and make it easier to breathe.  Drink enough water and fluids to keep your urine clear or pale yellow.  Rest as needed.  Return to work when your temperature has returned to normal or as your caregiver advises. You may need to stay home longer to avoid infecting others. You can also use a face mask and careful hand washing to prevent spread of the virus. SEEK MEDICAL CARE IF:   After the first few days, you feel you are getting worse rather than better.  You need your caregiver's advice about medicines to control symptoms.  You develop chills, worsening shortness of breath, or brown or red sputum. These may be signs of pneumonia.  You develop yellow or brown nasal discharge or pain in the face, especially when you bend forward. These may be signs of sinusitis.  You develop a fever, swollen neck glands, pain with swallowing, or white areas in the back of your throat. These may be signs of strep throat. SEEK IMMEDIATE MEDICAL CARE IF:   You have a fever.  You develop severe or persistent headache, ear pain, sinus pain, or chest pain.  You develop wheezing, a prolonged cough, cough up blood, or have a change in your usual mucus (if you have chronic lung disease).  You develop sore muscles or a stiff neck. Document Released: 10/16/2000 Document Revised: 07/15/2011 Document Reviewed: 07/28/2013 Exit[employer] Patient Information 2015 [employer]. This information is not intended to replace advice given to you by your health care provider. Make sure you discuss any questions you have with your health care provider.  

## 2014-06-20 ENCOUNTER — Telehealth: Payer: Self-pay | Admitting: Internal Medicine

## 2014-06-20 DIAGNOSIS — J0100 Acute maxillary sinusitis, unspecified: Secondary | ICD-10-CM

## 2014-06-20 MED ORDER — CLARITHROMYCIN ER 500 MG PO TB24: 1000.0000 mg | ORAL_TABLET | Freq: Every day | ORAL | Status: DC

## 2014-06-20 NOTE — Telephone Encounter (Signed)
Pt called stated that Dr. Linna Darner gave her amoxicillin (AMOXIL) 875 MG tablet and prednison, her face get real tight when she take these med. Pt is not sure what to do maybe this might be the reaction. Please call pt

## 2014-06-20 NOTE — Telephone Encounter (Signed)
Phone call to patient and left a message stating to discontinue Amoxicillin and prednisone and to start Biaxin XL that has been sent to her pharmacy.   Patient called back and I verbally gave her the message. She states she is having blood in her stools and this has happened before.

## 2014-06-20 NOTE — Telephone Encounter (Signed)
Patient advised and transferred to scheduling for an appointment

## 2014-06-20 NOTE — Telephone Encounter (Signed)
Neither are listed as allergies.Prednisone is used to treat allergic reactions; but it can be held. Amox can be changed to Biaxin XL1000  Mg 2 qd after meal #14

## 2014-06-20 NOTE — Telephone Encounter (Signed)
She needs to make appointment with Dr Doug Sou ASAP to evaluate the rectal bleeding

## 2014-06-21 ENCOUNTER — Other Ambulatory Visit (INDEPENDENT_AMBULATORY_CARE_PROVIDER_SITE_OTHER): Payer: 59

## 2014-06-21 ENCOUNTER — Encounter: Payer: Self-pay | Admitting: Internal Medicine

## 2014-06-21 ENCOUNTER — Ambulatory Visit (INDEPENDENT_AMBULATORY_CARE_PROVIDER_SITE_OTHER): Payer: 59 | Admitting: Internal Medicine

## 2014-06-21 VITALS — BP 132/98 | HR 78 | Temp 97.8°F | Resp 12 | Ht 63.0 in | Wt 188.8 lb

## 2014-06-21 DIAGNOSIS — D509 Iron deficiency anemia, unspecified: Secondary | ICD-10-CM

## 2014-06-21 DIAGNOSIS — R200 Anesthesia of skin: Secondary | ICD-10-CM | POA: Insufficient documentation

## 2014-06-21 DIAGNOSIS — K625 Hemorrhage of anus and rectum: Secondary | ICD-10-CM

## 2014-06-21 DIAGNOSIS — R7989 Other specified abnormal findings of blood chemistry: Secondary | ICD-10-CM

## 2014-06-21 DIAGNOSIS — R202 Paresthesia of skin: Secondary | ICD-10-CM

## 2014-06-21 DIAGNOSIS — E119 Type 2 diabetes mellitus without complications: Secondary | ICD-10-CM

## 2014-06-21 DIAGNOSIS — E079 Disorder of thyroid, unspecified: Secondary | ICD-10-CM

## 2014-06-21 LAB — LIPID PANEL
CHOLESTEROL: 242 mg/dL — AB (ref 0–200)
HDL: 42.4 mg/dL (ref >39.00)
NonHDL: 199.6
TRIGLYCERIDES: 232 mg/dL — AB (ref 0.0–149.0)
Total CHOL/HDL Ratio: 6
VLDL: 46.4 mg/dL — ABNORMAL HIGH (ref 0.0–40.0)

## 2014-06-21 LAB — COMPREHENSIVE METABOLIC PANEL
ALBUMIN: 4.1 g/dL (ref 3.5–5.2)
ALK PHOS: 84 U/L (ref 39–117)
ALT: 17 U/L (ref 0–35)
AST: 13 U/L (ref 0–37)
BILIRUBIN TOTAL: 0.4 mg/dL (ref 0.2–1.2)
BUN: 14 mg/dL (ref 6–23)
CO2: 30 mEq/L (ref 19–32)
Calcium: 9.6 mg/dL (ref 8.4–10.5)
Chloride: 103 mEq/L (ref 96–112)
Creatinine, Ser: 0.9 mg/dL (ref 0.40–1.20)
GFR: 84.54 mL/min (ref >60.00)
Glucose, Bld: 92 mg/dL (ref 70–99)
Potassium: 4.2 mEq/L (ref 3.5–5.1)
SODIUM: 139 meq/L (ref 135–145)
Total Protein: 7.7 g/dL (ref 6.0–8.3)

## 2014-06-21 LAB — CBC
HCT: 42.5 % (ref 36.0–46.0)
HEMOGLOBIN: 14.6 g/dL (ref 12.0–15.0)
MCHC: 34.3 g/dL (ref 30.0–36.0)
MCV: 85 fl (ref 78.0–100.0)
Platelets: 249 10*3/uL (ref 150.0–400.0)
RBC: 5 Mil/uL (ref 3.87–5.11)
RDW: 13.5 % (ref 11.5–15.5)
WBC: 8.3 10*3/uL (ref 4.0–10.5)

## 2014-06-21 LAB — HEMOGLOBIN A1C: HEMOGLOBIN A1C: 5.6 % (ref 4.6–6.5)

## 2014-06-21 LAB — T4, FREE: Free T4: 0.81 ng/dL (ref 0.60–1.60)

## 2014-06-21 LAB — FOLATE: Folate: 14.4 ng/mL (ref >5.9)

## 2014-06-21 LAB — VITAMIN B12: Vitamin B-12: 178 pg/mL — ABNORMAL LOW (ref 211–911)

## 2014-06-21 LAB — TSH: TSH: 4.15 u[IU]/mL (ref 0.35–4.50)

## 2014-06-21 LAB — LDL CHOLESTEROL, DIRECT: Direct LDL: 157 mg/dL

## 2014-06-21 MED ORDER — HYDROCORTISONE 2.5 % RE CREA: 1.0000 "application " | TOPICAL_CREAM | Freq: Two times a day (BID) | RECTAL | Status: DC

## 2014-06-21 MED ORDER — FLUTICASONE PROPIONATE 50 MCG/ACT NA SUSP: 2.0000 | Freq: Every day | NASAL | Status: DC

## 2014-06-21 NOTE — Assessment & Plan Note (Signed)
Check CBC today, could be from the rectal bleeding she is describing. No colonoscopy in our records but she thinks she got one at [place] some years ago. Will obtain those records and if time for repeat will get repeat colonoscopy.

## 2014-06-21 NOTE — Progress Notes (Signed)
   Subjective:    Patient ID: April Gonzalez, female    DOB: 13-Jun-1960, 54 y.o.   MRN: [id number]  HPI The patient is a 54 YO female who is coming in for bright red blood per rectum when she wipes. This has been going on for several years off and of. She also gets associated itching and burning when she wipes. She was using preparation H with some relief but then used listerine which she thinks was helpful. She also admits to some straining for bowel movements and has struggled off and on with constipation over the years. She is also having some other problems and we are unable to discuss all of them given the limited time. She is having body swelling and has for many years. She thinks it could be related to her sinuses although she is not sure. She was diagnosed with rheumatoid arthritis in her 53s but never took any medicine and has had some joint problems. She gets numbness in her feet and wants to make sure she does not have diabetes. She was treated for sinusitis and took penicillin based medicine and predinsone and felt that it caused a strange taste in her mouth. She denies facial swelling or throat swelling or numbness. She did not like how it did and so stopped taking it. She tried to fill the other antibiotic called in to replace but the pharmacy had to order it so she has not gotten it yet. She thinks overall her symptoms of the cold are getting better.   Review of Systems  Constitutional: Positive for fatigue. Negative for fever, chills, activity change, appetite change and unexpected weight change.  HENT: Positive for congestion, ear pain, postnasal drip and sinus pressure. Negative for facial swelling and sore throat.   Eyes: Negative.   Respiratory: Positive for cough. Negative for chest tightness, shortness of breath and wheezing.   Cardiovascular: Negative for chest pain, palpitations and leg swelling.  Gastrointestinal: Positive for constipation and anal bleeding. Negative for nausea,  abdominal pain, diarrhea and abdominal distention.  Genitourinary: Negative.   Musculoskeletal: Positive for myalgias, back pain and arthralgias. Negative for gait problem.  Skin: Negative.   Neurological: Positive for numbness and headaches. Negative for dizziness, weakness and light-headedness.      Objective:   Physical Exam  Constitutional: She is oriented to person, place, and time. She appears well-developed and well-nourished. No distress.  HENT:  Head: Normocephalic and atraumatic.  Right Ear: External ear normal.  Left Ear: External ear normal.  Nose and oropharynx with redness and swollen turbinates  Eyes: EOM are normal.  Neck: Normal range of motion.  Cardiovascular: Normal rate and regular rhythm.   Pulmonary/Chest: Effort normal and breath sounds normal. No respiratory distress. She has no wheezes. She has no rales.  Abdominal: Soft. Bowel sounds are normal. She exhibits no distension. There is no tenderness.  Musculoskeletal: She exhibits no edema.  Neurological: She is alert and oriented to person, place, and time. Coordination normal.  Skin: Skin is warm and dry.   Filed Vitals:   06/21/14 0831  BP: 132/98  Pulse: 78  Temp: 97.8 F (36.6 C)  TempSrc: Oral  Resp: 12  Height: 5\' 3"  (1.6 m)  Weight: 188 lb 12.8 oz (85.639 kg)  SpO2: 94%      Assessment & Plan:

## 2014-06-21 NOTE — Assessment & Plan Note (Signed)
Most likely hemorrhoids since she has bleeding itching and burning when she wipes. Will trial hydrocortisone rectal cream. Try to obtain records of last colonoscopy and if not done in the past (per patient it was) she will need one. Possibly she is due for repeat as she admits it has been many years. Unclear why she got one in the first place although she thinks she was having some kind of symptoms.

## 2014-06-21 NOTE — Patient Instructions (Addendum)
We will send in a medicine called flonase for your cold. Use 2 puffs in each nostril once a day to help clear up the drainage. You can also take ibuprofen or tylenol for your headache. You do not need to pick up the clarithromycin. You do not need more antibiotics. This is a cold which will go away in the next week or two. The coughing may last longer than the cold.   We have sent in some cream for your hemorrhoids. We will have you stop down in records by the lab to request the records from [facility] for your colonoscopy. If we cannot find those records we may need you to go back to the GI doctor and you may need another colonoscopy (they are done about every 10 years).  We are going to check your blood today to look for causes of the numbness in your feet.   If you chest pains continue after you stop coughing then we will look further into that and want you to call us back in about 2-3 weeks and let us know. We may need to see you back for this if it does not go away.   Upper Respiratory Infection, Adult An upper respiratory infection (URI) is also known as the common cold. It is often caused by a type of germ (virus). Colds are easily spread (contagious). You can pass it to others by kissing, coughing, sneezing, or drinking out of the same glass. Usually, you get better in 1 or 2 weeks.  HOME CARE   Only take medicine as told by your doctor.  Use a warm mist humidifier or breathe in steam from a hot shower.  Drink enough water and fluids to keep your pee (urine) clear or pale yellow.  Get plenty of rest.  Return to work when your temperature is back to normal or as told by your doctor. You may use a face mask and wash your hands to stop your cold from spreading. GET HELP RIGHT AWAY IF:   After the first few days, you feel you are getting worse.  You have questions about your medicine.  You have chills, shortness of breath, or brown or red spit (mucus).  You have yellow or brown snot  (nasal discharge) or pain in the face, especially when you bend forward.  You have a fever, puffy (swollen) neck, pain when you swallow, or white spots in the back of your throat.  You have a bad headache, ear pain, sinus pain, or chest pain.  You have a high-pitched whistling sound when you breathe in and out (wheezing).  You have a lasting cough or cough up blood.  You have sore muscles or a stiff neck. MAKE SURE YOU:   Understand these instructions.  Will watch your condition.  Will get help right away if you are not doing well or get worse. Document Released: 10/09/2007 Document Revised: 07/15/2011 Document Reviewed: 07/28/2013 [facility] Patient Information 2015 [place], [place]. This information is not intended to replace advice given to you by your health care provider. Make sure you discuss any questions you have with your health care provider.

## 2014-06-21 NOTE — Assessment & Plan Note (Signed)
Check CBC, folate, B12, HgA1c.

## 2014-06-21 NOTE — Assessment & Plan Note (Signed)
Check HgA1c today, could be related to numbness in the feet. Not currently treated.

## 2014-06-21 NOTE — Progress Notes (Signed)
Pre visit review using our clinic review tool, if applicable. No additional management support is needed unless otherwise documented below in the visit note. 

## 2014-06-21 NOTE — Assessment & Plan Note (Signed)
Unclear if she had hyper or hypo thyroidism but will check levels today as she is concerned. Not currently on treatment and no indication in surgical history of thyroidectomy. She does have allergy to methimazole which makes me suspicious that she had hyperthyroidism at some time.

## 2014-06-23 ENCOUNTER — Other Ambulatory Visit: Payer: Self-pay | Admitting: Internal Medicine

## 2014-06-23 MED ORDER — VITAMIN B-12 1000 MCG PO TABS: 1000.0000 ug | ORAL_TABLET | Freq: Every day | ORAL | Status: DC

## 2014-06-24 ENCOUNTER — Other Ambulatory Visit: Payer: Self-pay | Admitting: Geriatric Medicine

## 2014-06-24 ENCOUNTER — Telehealth: Payer: Self-pay | Admitting: Internal Medicine

## 2014-06-24 MED ORDER — VITAMIN B-12 1000 MCG PO TABS: 1000.0000 ug | ORAL_TABLET | Freq: Every day | ORAL | Status: DC

## 2014-06-24 NOTE — Telephone Encounter (Signed)
Pt called [facility] told her that they do not have it. Marland Kitchen  She wants to know if we can resend it.  She said that she thinks the lady there is knew and not sure she really knows if it is there or not?   Amy can you resend it?

## 2014-06-24 NOTE — Telephone Encounter (Signed)
Resent - tried to call patient, no answer.

## 2014-07-13 ENCOUNTER — Telehealth: Payer: Self-pay | Admitting: Internal Medicine

## 2014-07-13 NOTE — Telephone Encounter (Signed)
Please advise if this is appropriate.

## 2014-07-13 NOTE — Telephone Encounter (Signed)
Having trouble remembering things since she was in an accident.  She needs a note stating that she needs to be in a room by herself with extra time to complete her test for school.

## 2014-07-14 ENCOUNTER — Ambulatory Visit: Payer: 59 | Admitting: Internal Medicine

## 2014-07-14 ENCOUNTER — Ambulatory Visit: Payer: 59 | Admitting: Family

## 2014-07-14 ENCOUNTER — Ambulatory Visit (INDEPENDENT_AMBULATORY_CARE_PROVIDER_SITE_OTHER): Payer: 59 | Admitting: Internal Medicine

## 2014-07-14 ENCOUNTER — Telehealth: Payer: Self-pay | Admitting: Internal Medicine

## 2014-07-14 ENCOUNTER — Encounter: Payer: Self-pay | Admitting: Internal Medicine

## 2014-07-14 VITALS — BP 136/84 | HR 84 | Temp 98.6°F | Resp 18 | Ht 63.0 in | Wt 186.0 lb

## 2014-07-14 DIAGNOSIS — K921 Melena: Secondary | ICD-10-CM

## 2014-07-14 DIAGNOSIS — E119 Type 2 diabetes mellitus without complications: Secondary | ICD-10-CM

## 2014-07-14 DIAGNOSIS — R1084 Generalized abdominal pain: Secondary | ICD-10-CM

## 2014-07-14 MED ORDER — PANTOPRAZOLE SODIUM 40 MG PO TBEC: 40.0000 mg | DELAYED_RELEASE_TABLET | Freq: Every day | ORAL | Status: DC

## 2014-07-14 NOTE — Progress Notes (Signed)
Subjective:    Patient ID: April Gonzalez, female    DOB: [birth year], 54 y.o.   MRN: [id number]  HPI  Here with oft small volume BRB mixed with stool x 1 yesterday, + hx of hemorrhoid assoc with hysterectomy years ago per pt though also says she had a tear at the time, still with some intermittent discomfort since that time.  Also with 6-7 mo tender mid abd pain sharp sticking type pain, no raidation, no n/v, also light occas soft stools.  No wt loss.  Appetite ok, ? Low grade temp occas.  Pt denies chest pain, increased sob or doe, wheezing, orthopnea, PND, increased LE swelling, palpitations, dizziness or syncope.  Pt denies new neurological symptoms such as new headache, or facial or extremity weakness or numbness Past Medical History  Diagnosis Date  . Seizures     last seizure age 68 or 10 per pt report  . RA (rheumatoid arthritis)     states was dx at age 63 but not on meds  . H/O uterine prolapse   . Thyroid disorder   . Fibroids     H/O myomectomy  . Anemia 05/2012    transfusion  . Heart murmur   . History of blood transfusion    Past Surgical History  Procedure Laterality Date  . Excision vaginal cyst    . Tubal ligation  10/10/1991  . Myomectomy  91YRS AGO    HYSTEROSCOPIC  . Vaginal hysterectomy N/A 12/10/2012    Procedure: Total Vaginal Hysterectomy;  Surgeon: Eldred Manges, MD;  Location: [facility];  Service: Gynecology;  Laterality: N/A;  . Cystocele repair N/A 12/10/2012    Procedure: ANTERIOR REPAIR (CYSTOCELE);  Surgeon: Eldred Manges, MD;  Location: [facility];  Service: Gynecology;  Laterality: N/A;  . Bladder suspension N/A 12/10/2012    Procedure: TRANSVAGINAL TAPE (TVT) PROCEDURE;  Surgeon: Delice Lesch, MD;  Location: [facility];  Service: Gynecology;  Laterality: N/A;  . Cystoscopy N/A 12/10/2012    Procedure: CYSTOSCOPY;  Surgeon: Delice Lesch, MD;  Location: [facility];  Service: Gynecology;  Laterality: N/A;    reports that she has never smoked. She has never used  smokeless tobacco. She reports that she drinks alcohol. She reports that she does not use illicit drugs. family history includes Arthritis in her mother; Breast cancer in her cousin and maternal aunt; Diabetes in her mother; Heart failure in her father; High blood pressure in her mother; Hypertension in her mother. Allergies  Allergen Reactions  . Other     Seasonal / dog hair   . Methimazole     REACTION: anaphylaxis  . Nitroglycerin     REACTION: sweating  . Terbutaline   . Tetracycline Hcl     REACTION: elevated blood pressure   No current outpatient prescriptions on file prior to visit.   No current facility-administered medications on file prior to visit.   Review of Systems  Constitutional: Negative for unusual diaphoresis or night sweats HENT: Negative for ringing in ear or discharge Eyes: Negative for double vision or worsening visual disturbance.  Respiratory: Negative for choking and stridor.   Gastrointestinal: Negative for vomiting or other signifcant bowel change Genitourinary: Negative for hematuria or change in urine volume.  Musculoskeletal: Negative for other MSK pain or swelling Skin: Negative for color change and worsening wound.  Neurological: Negative for tremors and numbness other than noted  Psychiatric/Behavioral: Negative for decreased concentration or agitation other than above  Objective:   Physical Exam BP 136/84 mmHg  Pulse 84  Temp(Src) 98.6 F (37 C) (Oral)  Resp 18  Ht 5\' 3"  (1.6 m)  Wt 186 lb (84.369 kg)  BMI 32.96 kg/m2  SpO2 94%  LMP 12/03/2012 VS noted,  Constitutional: Pt appears in no significant distress HENT: Head: NCAT.  Right Ear: External ear normal.  Left Ear: External ear normal.  Eyes: . Pupils are equal, round, and reactive to light. Conjunctivae and EOM are normal Neck: Normal range of motion. Neck supple.  Cardiovascular: Normal rate and regular rhythm.   Pulmonary/Chest: Effort normal and breath sounds without  rales or wheezing.  Abd:  Soft, NT, ND, + BS Neurological: Pt is alert. Not confused , motor grossly intact Skin: Skin is warm. No rash, no LE edema Psychiatric: Pt behavior is normal. No agitation.     Assessment & Plan:

## 2014-07-14 NOTE — Assessment & Plan Note (Signed)
?   Fissure vs other, for Gi referal as also due for colonoscopy

## 2014-07-14 NOTE — Telephone Encounter (Signed)
Unfortunately due to overwhelming number of complaints at the last (and our first) visit I do not know anything about an accident or slowing. She would need to be seen and this discussed before a note could be provided. She can schedule an acute visit if needed.

## 2014-07-14 NOTE — Assessment & Plan Note (Signed)
stable overall by history and exam, recent data reviewed with pt, and pt to continue medical treatment as before,  to f/u any worsening symptoms or concerns Lab Results  Component Value Date   HGBA1C 5.6 06/21/2014

## 2014-07-14 NOTE — Assessment & Plan Note (Signed)
Etiology unclear, for lab eval as ordered, refer GI, empiric trial PPI,  to f/u any worsening symptoms or concerns

## 2014-07-14 NOTE — Telephone Encounter (Signed)
Patient has blood in her stool.

## 2014-07-14 NOTE — Telephone Encounter (Signed)
Patient states she will call back to make an appointment

## 2014-07-14 NOTE — Patient Instructions (Signed)
Please take all new medication as prescribed - the protonix  Please continue all other medications as before, and refills have been done if requested.  Please have the pharmacy call with any other refills you may need.  Please continue your efforts at being more active, low cholesterol diet, and weight control.  You are otherwise up to date with prevention measures today.  You will be contacted regarding the referral for: GI - gastroenterology  Please keep your appointments with your specialists as you may have planned  Please go to the LAB in the Basement (turn left off the elevator) for the tests to be done today  You will be contacted by phone if any changes need to be made immediately.  Otherwise, you will receive a letter about your results with an explanation, but please check with MyChart first.  Please remember to sign up for MyChart if you have not done so, as this will be important to you in the future with finding out test results, communicating by private email, and scheduling acute appointments online when needed.

## 2014-07-14 NOTE — Progress Notes (Signed)
Pre visit review using our clinic review tool, if applicable. No additional management support is needed unless otherwise documented below in the visit note. 

## 2014-07-14 NOTE — Telephone Encounter (Signed)
Patient made an appointment to come in.

## 2014-07-20 ENCOUNTER — Telehealth: Payer: Self-pay | Admitting: Internal Medicine

## 2014-07-20 NOTE — Telephone Encounter (Signed)
Patient stated she haven't heard anything from Gastro to set up an appt, please advise

## 2014-07-21 NOTE — Telephone Encounter (Signed)
Called patient back, Told her about her appointment. She was wondering about the cost. I told her to contact her insurance company so I had no control over that. I gave her the number to the GI office in case she wanted to call them about her appointment. She said that she would do so.

## 2014-07-21 NOTE — Telephone Encounter (Signed)
Left message for patient to call back  

## 2014-07-27 ENCOUNTER — Ambulatory Visit: Payer: 59 | Admitting: Physician Assistant

## 2014-08-03 ENCOUNTER — Emergency Department (HOSPITAL_COMMUNITY): Payer: 59

## 2014-08-03 ENCOUNTER — Emergency Department (HOSPITAL_COMMUNITY)
Admission: EM | Admit: 2014-08-03 | Discharge: 2014-08-03 | Disposition: A | Discharge status: Home / Self Care | Payer: 59 | Attending: Emergency Medicine | Admitting: Emergency Medicine

## 2014-08-03 ENCOUNTER — Encounter (HOSPITAL_COMMUNITY): Payer: Self-pay | Admitting: Family Medicine

## 2014-08-03 DIAGNOSIS — R011 Cardiac murmur, unspecified: Secondary | ICD-10-CM | POA: Insufficient documentation

## 2014-08-03 DIAGNOSIS — Z9851 Tubal ligation status: Secondary | ICD-10-CM | POA: Diagnosis not present

## 2014-08-03 DIAGNOSIS — Z8639 Personal history of other endocrine, nutritional and metabolic disease: Secondary | ICD-10-CM | POA: Insufficient documentation

## 2014-08-03 DIAGNOSIS — R109 Unspecified abdominal pain: Secondary | ICD-10-CM

## 2014-08-03 DIAGNOSIS — M1990 Unspecified osteoarthritis, unspecified site: Secondary | ICD-10-CM | POA: Insufficient documentation

## 2014-08-03 DIAGNOSIS — R1031 Right lower quadrant pain: Secondary | ICD-10-CM | POA: Diagnosis not present

## 2014-08-03 DIAGNOSIS — Z90710 Acquired absence of both cervix and uterus: Secondary | ICD-10-CM | POA: Insufficient documentation

## 2014-08-03 DIAGNOSIS — M545 Low back pain: Secondary | ICD-10-CM | POA: Diagnosis not present

## 2014-08-03 DIAGNOSIS — Z8742 Personal history of other diseases of the female genital tract: Secondary | ICD-10-CM | POA: Insufficient documentation

## 2014-08-03 DIAGNOSIS — Z880 Allergy status to penicillin: Secondary | ICD-10-CM | POA: Insufficient documentation

## 2014-08-03 DIAGNOSIS — R300 Dysuria: Secondary | ICD-10-CM

## 2014-08-03 DIAGNOSIS — M549 Dorsalgia, unspecified: Secondary | ICD-10-CM | POA: Diagnosis present

## 2014-08-03 LAB — COMPREHENSIVE METABOLIC PANEL
ALK PHOS: 97 U/L (ref 39–117)
ALT: 22 U/L (ref 0–35)
AST: 21 U/L (ref 0–37)
Albumin: 4.2 g/dL (ref 3.5–5.2)
Anion gap: 13 (ref 5–15)
BUN: 12 mg/dL (ref 6–23)
CALCIUM: 9.8 mg/dL (ref 8.4–10.5)
CHLORIDE: 105 mmol/L (ref 96–112)
CO2: 21 mmol/L (ref 19–32)
Creatinine, Ser: 0.81 mg/dL (ref 0.50–1.10)
GFR calc Af Amer: >90 mL/min (ref >90)
GFR, EST NON AFRICAN AMERICAN: 82 mL/min — AB (ref >90)
GLUCOSE: 97 mg/dL (ref 70–99)
Potassium: 4 mmol/L (ref 3.5–5.1)
Sodium: 139 mmol/L (ref 135–145)
Total Bilirubin: 0.5 mg/dL (ref 0.3–1.2)
Total Protein: 7.9 g/dL (ref 6.0–8.3)

## 2014-08-03 LAB — CBC
HCT: 44.1 % (ref 36.0–46.0)
Hemoglobin: 14.5 g/dL (ref 12.0–15.0)
MCH: 29 pg (ref 26.0–34.0)
MCHC: 32.9 g/dL (ref 30.0–36.0)
MCV: 88.2 fL (ref 78.0–100.0)
Platelets: 235 10*3/uL (ref 150–400)
RBC: 5 MIL/uL (ref 3.87–5.11)
RDW: 12.4 % (ref 11.5–15.5)
WBC: 8.9 10*3/uL (ref 4.0–10.5)

## 2014-08-03 LAB — DIFFERENTIAL
Basophils Absolute: 0 10*3/uL (ref 0.0–0.1)
Basophils Relative: 0 % (ref 0–1)
EOS PCT: 5 % (ref 0–5)
Eosinophils Absolute: 0.4 10*3/uL (ref 0.0–0.7)
Lymphocytes Relative: 26 % (ref 12–46)
Lymphs Abs: 2.3 10*3/uL (ref 0.7–4.0)
MONOS PCT: 7 % (ref 3–12)
Monocytes Absolute: 0.6 10*3/uL (ref 0.1–1.0)
NEUTROS ABS: 5.4 10*3/uL (ref 1.7–7.7)
NEUTROS PCT: 62 % (ref 43–77)

## 2014-08-03 LAB — URINALYSIS, ROUTINE W REFLEX MICROSCOPIC
BILIRUBIN URINE: NEGATIVE
Glucose, UA: NEGATIVE mg/dL
KETONES UR: 15 mg/dL — AB
LEUKOCYTES UA: NEGATIVE
Nitrite: NEGATIVE
PROTEIN: NEGATIVE mg/dL
Specific Gravity, Urine: 1.01 (ref 1.005–1.030)
UROBILINOGEN UA: 0.2 mg/dL (ref 0.0–1.0)
pH: 6 (ref 5.0–8.0)

## 2014-08-03 LAB — URINE MICROSCOPIC-ADD ON

## 2014-08-03 LAB — CBG MONITORING, ED: Glucose-Capillary: 82 mg/dL (ref 70–99)

## 2014-08-03 LAB — LIPASE, BLOOD: Lipase: 45 U/L (ref 11–59)

## 2014-08-03 MED ORDER — SODIUM CHLORIDE 0.9 % IV SOLN
1000.0000 mL | INTRAVENOUS | Status: DC
  Administered 2014-08-03: 1000 mL via INTRAVENOUS

## 2014-08-03 MED ORDER — SODIUM CHLORIDE 0.9 % IV SOLN
1000.0000 mL | Freq: Once | INTRAVENOUS | Status: AC
  Administered 2014-08-03: 1000 mL via INTRAVENOUS

## 2014-08-03 MED ORDER — ONDANSETRON HCL 4 MG/2ML IJ SOLN
4.0000 mg | Freq: Once | INTRAMUSCULAR | Status: AC
  Administered 2014-08-03: 4 mg via INTRAVENOUS
  Filled 2014-08-03: qty 2

## 2014-08-03 MED ORDER — HYDROCODONE-ACETAMINOPHEN 5-325 MG PO TABS: 1.0000 | ORAL_TABLET | ORAL | Status: DC | PRN

## 2014-08-03 MED ORDER — MORPHINE SULFATE 4 MG/ML IJ SOLN
4.0000 mg | Freq: Once | INTRAMUSCULAR | Status: AC
  Administered 2014-08-03: 4 mg via INTRAVENOUS
  Filled 2014-08-03: qty 1

## 2014-08-03 NOTE — ED Provider Notes (Signed)
CSN: [id number]     Arrival date & time 08/03/14  1852 History   First MD Initiated Contact with Patient 08/03/14 1945     Chief Complaint  Patient presents with  . Numbness  . Back Pain  . Headache  . Abdominal Pain    HPI Patient presents to emergency room with complaints of abdominal pain and back pain. Patient states she first noted this morning that she was having pain in the right lower abdomen. She is also having some urinary frequency and discomfort. The symptoms have persisted and increased through the day. She denies any vomiting or diarrhea.  She's having some weakness in both legs and also felt that her legs were numb. She has had some headache and back discomfort as well. Past Medical History  Diagnosis Date  . Seizures     last seizure age 89 or 10 per pt report  . RA (rheumatoid arthritis)     states was dx at age 52 but not on meds  . H/O uterine prolapse   . Thyroid disorder   . Fibroids     H/O myomectomy  . Anemia 05/2012    transfusion  . Heart murmur   . History of blood transfusion    Past Surgical History  Procedure Laterality Date  . Excision vaginal cyst    . Tubal ligation  10/10/1991  . Myomectomy  80YRS AGO    HYSTEROSCOPIC  . Vaginal hysterectomy N/A 12/10/2012    Procedure: Total Vaginal Hysterectomy;  Surgeon: Eldred Manges, MD;  Location: [facility];  Service: Gynecology;  Laterality: N/A;  . Cystocele repair N/A 12/10/2012    Procedure: ANTERIOR REPAIR (CYSTOCELE);  Surgeon: Eldred Manges, MD;  Location: [facility];  Service: Gynecology;  Laterality: N/A;  . Bladder suspension N/A 12/10/2012    Procedure: TRANSVAGINAL TAPE (TVT) PROCEDURE;  Surgeon: Delice Lesch, MD;  Location: [facility];  Service: Gynecology;  Laterality: N/A;  . Cystoscopy N/A 12/10/2012    Procedure: CYSTOSCOPY;  Surgeon: Delice Lesch, MD;  Location: [facility];  Service: Gynecology;  Laterality: N/A;   Family History  Problem Relation Age of Onset  . Heart failure Father   .  Diabetes Mother   . High blood pressure Mother   . Arthritis Mother   . Hypertension Mother   . Breast cancer Maternal Aunt     in her 84's   . Breast cancer Cousin    History  Substance Use Topics  . Smoking status: Never Smoker   . Smokeless tobacco: Never Used  . Alcohol Use: Yes     Comment: rare   OB History    Gravida Para Term Preterm AB TAB SAB Ectopic Multiple Living   2 2 2       2      Review of Systems  All other systems reviewed and are negative.     Allergies  Other; Methimazole; Nitroglycerin; Penicillins; Terbutaline; and Tetracycline hcl  Home Medications   Prior to Admission medications   Medication Sig Start Date End Date Taking? Authorizing Provider  HYDROcodone-acetaminophen (NORCO/VICODIN) 5-325 MG per tablet Take 1-2 tablets by mouth every 4 (four) hours as needed. 08/03/14   Dorie Rank, MD  pantoprazole (PROTONIX) 40 MG tablet Take 1 tablet (40 mg total) by mouth daily. Patient not taking: Reported on 08/03/2014 07/14/14   Biagio Borg, MD   BP 156/93 mmHg  Pulse 73  Temp(Src) 98 F (36.7 C) (Oral)  Resp 18  SpO2 94%  LMP 12/03/2012 Physical Exam  Constitutional: She appears well-developed and well-nourished. No distress.  HENT:  Head: Normocephalic and atraumatic.  Right Ear: External ear normal.  Left Ear: External ear normal.  Eyes: Conjunctivae are normal. Right eye exhibits no discharge. Left eye exhibits no discharge. No scleral icterus.  Neck: Neck supple. No tracheal deviation present.  Cardiovascular: Normal rate, regular rhythm and intact distal pulses.   Pulmonary/Chest: Effort normal and breath sounds normal. No stridor. No respiratory distress. She has no wheezes. She has no rales.  Abdominal: Soft. Bowel sounds are normal. She exhibits no distension. There is tenderness in the right lower quadrant. There is no rebound and no guarding. No hernia.  Musculoskeletal: She exhibits no edema or tenderness.  Neurological: She is alert.  She has normal strength. No cranial nerve deficit (no facial droop, extraocular movements intact, no slurred speech) or sensory deficit. She exhibits normal muscle tone. She displays no seizure activity. Coordination normal.  Patient has normal sensation to touch bilateral lower extremities, she can lift both legs off the bed, normal plantar flexion strength, normal grip strength bilaterally.  Skin: Skin is warm and dry. No rash noted.  Psychiatric: She has a normal mood and affect.  Nursing note and vitals reviewed.   ED Course  Procedures (including critical care time) Labs Review Labs Reviewed  COMPREHENSIVE METABOLIC PANEL - Abnormal; Notable for the following:    GFR calc non Af Amer 82 (*)    All other components within normal limits  URINALYSIS, ROUTINE W REFLEX MICROSCOPIC - Abnormal; Notable for the following:    Hgb urine dipstick TRACE (*)    Ketones, ur 15 (*)    All other components within normal limits  CBC  DIFFERENTIAL  LIPASE, BLOOD  URINE MICROSCOPIC-ADD ON  CBG MONITORING, ED    Imaging Review Ct Renal Stone Study  08/03/2014   CLINICAL DATA:  this A.M. left leg pain, numbness and weakness. sts also headache, back pain. Pt sts also some right groin pain. Pt states she has hematuria, nausea with no vomiting or diarrhea. No h/o kidney stones. H/o hysterectomy  EXAM: CT ABDOMEN AND PELVIS WITHOUT CONTRAST  TECHNIQUE: Multidetector CT imaging of the abdomen and pelvis was performed following the standard protocol without IV contrast.  COMPARISON:  07/05/2009  FINDINGS: Linear scarring or subsegmental atelectasis in the visualized lower lobes. Scattered coronary calcifications. Unremarkable liver, nondilated gallbladder, spleen, adrenal glands, pancreas, right kidney. Unenhanced CT was performed per clinician order. Lack of IV contrast limits sensitivity and specificity, especially for evaluation of abdominal/pelvic solid viscera. Two calculi in the lower pole of the left  renal collecting system, each 2-3 mm. No hydronephrosis or ureterectasis. Urinary bladder is incompletely distended.  Stomach, small bowel, and colon are nondilated. Normal appendix. Uterus absent. No ascites. No free air. No adenopathy localized. Regional bones unremarkable.  IMPRESSION: 1. Left nephrolithiasis without hydronephrosis, ureterectasis, or ureteral calculus. 2. Atherosclerosis, including coronary artery disease. Please note that although the presence of coronary artery calcium documents the presence of coronary artery disease, the severity of this disease and any potential stenosis cannot be assessed on this non-gated CT examination. Assessment for potential risk factor modification, dietary therapy or pharmacologic therapy may be warranted, if clinically indicated.   Electronically Signed   By: Lucrezia Europe M.D.   On: 08/03/2014 21:43   Medications  0.9 %  sodium chloride infusion (0 mLs Intravenous Stopped 08/03/14 2120)    Followed by  0.9 %  sodium chloride infusion (  1,000 mLs Intravenous New Bag/Given 08/03/14 2017)  ondansetron [facility]) injection 4 mg (4 mg Intravenous Given 08/03/14 2017)  morphine 4 MG/ML injection 4 mg (4 mg Intravenous Given 08/03/14 2017)     MDM   Final diagnoses:  Low back pain without sciatica, unspecified back pain laterality  Abdominal pain, unspecified abdominal location    No acute findings on CT scan and labs.  Normal strength and sensation on exam. No acute neuro deficits.  Pt improve with treatment.  Unclear what her symptoms are from but At this time there does not appear to be any evidence of an acute emergency medical condition and the patient appears stable for discharge with appropriate outpatient follow up.     Dorie Rank, MD 08/03/14 2213

## 2014-08-03 NOTE — ED Notes (Addendum)
Per pt sts she woke up this am with left leg pain, numbness and weakness. sts also headache, back pain. Pt sts also some right groin pain.

## 2014-08-03 NOTE — Discharge Instructions (Signed)

## 2014-08-03 NOTE — ED Notes (Signed)
Pt returned from CT °

## 2014-09-21 ENCOUNTER — Telehealth: Payer: Self-pay | Admitting: Internal Medicine

## 2014-10-29 ENCOUNTER — Emergency Department (HOSPITAL_COMMUNITY)
Admission: EM | Admit: 2014-10-29 | Discharge: 2014-10-29 | Disposition: A | Discharge status: Home / Self Care | Payer: 59 | Attending: Emergency Medicine | Admitting: Emergency Medicine

## 2014-10-29 ENCOUNTER — Encounter (HOSPITAL_COMMUNITY): Payer: Self-pay | Admitting: *Deleted

## 2014-10-29 ENCOUNTER — Emergency Department (HOSPITAL_COMMUNITY): Payer: 59

## 2014-10-29 DIAGNOSIS — R011 Cardiac murmur, unspecified: Secondary | ICD-10-CM | POA: Diagnosis not present

## 2014-10-29 DIAGNOSIS — Z862 Personal history of diseases of the blood and blood-forming organs and certain disorders involving the immune mechanism: Secondary | ICD-10-CM | POA: Diagnosis not present

## 2014-10-29 DIAGNOSIS — R0789 Other chest pain: Secondary | ICD-10-CM | POA: Diagnosis not present

## 2014-10-29 DIAGNOSIS — Z880 Allergy status to penicillin: Secondary | ICD-10-CM | POA: Insufficient documentation

## 2014-10-29 DIAGNOSIS — R0981 Nasal congestion: Secondary | ICD-10-CM

## 2014-10-29 DIAGNOSIS — Z86018 Personal history of other benign neoplasm: Secondary | ICD-10-CM | POA: Insufficient documentation

## 2014-10-29 DIAGNOSIS — Z8639 Personal history of other endocrine, nutritional and metabolic disease: Secondary | ICD-10-CM | POA: Diagnosis not present

## 2014-10-29 DIAGNOSIS — H5712 Ocular pain, left eye: Secondary | ICD-10-CM

## 2014-10-29 DIAGNOSIS — M069 Rheumatoid arthritis, unspecified: Secondary | ICD-10-CM | POA: Insufficient documentation

## 2014-10-29 DIAGNOSIS — Z8742 Personal history of other diseases of the female genital tract: Secondary | ICD-10-CM | POA: Insufficient documentation

## 2014-10-29 DIAGNOSIS — M542 Cervicalgia: Secondary | ICD-10-CM | POA: Insufficient documentation

## 2014-10-29 DIAGNOSIS — R51 Headache: Secondary | ICD-10-CM | POA: Diagnosis present

## 2014-10-29 LAB — BASIC METABOLIC PANEL
Anion gap: 9 (ref 5–15)
BUN: 11 mg/dL (ref 6–20)
CALCIUM: 9.6 mg/dL (ref 8.9–10.3)
CO2: 25 mmol/L (ref 22–32)
CREATININE: 0.77 mg/dL (ref 0.44–1.00)
Chloride: 105 mmol/L (ref 101–111)
GFR calc Af Amer: >60 mL/min (ref >60)
Glucose, Bld: 91 mg/dL (ref 65–99)
Potassium: 3.8 mmol/L (ref 3.5–5.1)
SODIUM: 139 mmol/L (ref 135–145)

## 2014-10-29 LAB — CBC
HCT: 44.1 % (ref 36.0–46.0)
Hemoglobin: 14.8 g/dL (ref 12.0–15.0)
MCH: 28.7 pg (ref 26.0–34.0)
MCHC: 33.6 g/dL (ref 30.0–36.0)
MCV: 85.5 fL (ref 78.0–100.0)
PLATELETS: 253 10*3/uL (ref 150–400)
RBC: 5.16 MIL/uL — ABNORMAL HIGH (ref 3.87–5.11)
RDW: 12.2 % (ref 11.5–15.5)
WBC: 7.7 10*3/uL (ref 4.0–10.5)

## 2014-10-29 LAB — I-STAT TROPONIN, ED: TROPONIN I, POC: 0 ng/mL (ref 0.00–0.08)

## 2014-10-29 MED ORDER — FLUTICASONE PROPIONATE 50 MCG/ACT NA SUSP: 2.0000 | Freq: Every day | NASAL | Status: DC

## 2014-10-29 MED ORDER — DIAZEPAM 5 MG PO TABS
5.0000 mg | ORAL_TABLET | Freq: Once | ORAL | Status: AC
  Administered 2014-10-29: 5 mg via ORAL
  Filled 2014-10-29: qty 1

## 2014-10-29 MED ORDER — TETRACAINE HCL 0.5 % OP SOLN
2.0000 [drp] | Freq: Once | OPHTHALMIC | Status: AC
  Administered 2014-10-29: 2 [drp] via OPHTHALMIC
  Filled 2014-10-29: qty 2

## 2014-10-29 NOTE — Discharge Instructions (Signed)
Follow up with both your primary care doctor and eye doctor, call Monday to schedule an appointment. Return with any worsening symptoms. Use nasal spray as directed. You may also use over-the-counter decongestants.  Chest Wall Pain Chest wall pain is pain in or around the bones and muscles of your chest. It may take up to 6 weeks to get better. It may take longer if you must stay physically active in your work and activities.  CAUSES  Chest wall pain may happen on its own. However, it may be caused by:  A viral illness like the flu.  Injury.  Coughing.  Exercise.  Arthritis.  Fibromyalgia.  Shingles. HOME CARE INSTRUCTIONS   Avoid overtiring physical activity. Try not to strain or perform activities that cause pain. This includes any activities using your chest or your abdominal and side muscles, especially if heavy weights are used.  Put ice on the sore area.  Put ice in a plastic bag.  Place a towel between your skin and the bag.  Leave the ice on for 15-20 minutes per hour while awake for the first 2 days.  Only take over-the-counter or prescription medicines for pain, discomfort, or fever as directed by your caregiver. SEEK IMMEDIATE MEDICAL CARE IF:   Your pain increases, or you are very uncomfortable.  You have a fever.  Your chest pain becomes worse.  You have new, unexplained symptoms.  You have nausea or vomiting.  You feel sweaty or lightheaded.  You have a cough with phlegm (sputum), or you cough up blood. MAKE SURE YOU:   Understand these instructions.  Will watch your condition.  Will get help right away if you are not doing well or get worse. Document Released: 04/22/2005 Document Revised: 07/15/2011 Document Reviewed: 12/17/2010 [facility] Patient Information 2015 [place], [place]. This information is not intended to replace advice given to you by your health care provider. Make sure you discuss any questions you have with your health care  provider.  Sinus Headache A sinus headache is when your sinuses become clogged or swollen. Sinus headaches can range from mild to severe.  CAUSES A sinus headache can have different causes, such as:  Colds.  Sinus infections.  Allergies. SYMPTOMS  Symptoms of a sinus headache may vary and can include:  Headache.  Pain or pressure in the face.  Congested or runny nose.  Fever.  Inability to smell.  Pain in upper teeth. Weather changes can make symptoms worse. TREATMENT  The treatment of a sinus headache depends on the cause.  Sinus pain caused by a sinus infection may be treated with antibiotic medicine.  Sinus pain caused by allergies may be helped by allergy medicines (antihistamines) and medicated nasal sprays.  Sinus pain caused by congestion may be helped by flushing the nose and sinuses with saline solution. HOME CARE INSTRUCTIONS   If antibiotics are prescribed, take them as directed. Finish them even if you start to feel better.  Only take over-the-counter or prescription medicines for pain, discomfort, or fever as directed by your caregiver.  If you have congestion, use a nasal spray to help reduce pressure. SEEK IMMEDIATE MEDICAL CARE IF:  You have a fever.  You have headaches more than once a week.  You have sensitivity to light or sound.  You have repeated nausea and vomiting.  You have vision problems.  You have sudden, severe pain in your face or head.  You have a seizure.  You are confused.  Your sinus headaches do not  get better after treatment. Many people think they have a sinus headache when they actually have migraines or tension headaches. MAKE SURE YOU:   Understand these instructions.  Will watch your condition.  Will get help right away if you are not doing well or get worse. Document Released: 05/30/2004 Document Revised: 07/15/2011 Document Reviewed: 07/21/2010 [facility] Patient Information 2015 [place], [place]. This  information is not intended to replace advice given to you by your health care provider. Make sure you discuss any questions you have with your health care provider.

## 2014-10-29 NOTE — ED Provider Notes (Signed)
CSN: [id number]     Arrival date & time 10/29/14  1854 History   First MD Initiated Contact with Patient 10/29/14 1901     Chief Complaint  Patient presents with  . Headache  . Neck Pain     (Consider location/radiation/quality/duration/timing/severity/associated sxs/prior Treatment) HPI Comments: 54 year old female presenting with gradual onset left-sided neck pain beginning 30 minutes prior to arrival. States she was doing nothing in specific when the pain began. Pain described as sharp and stabbing, occasionally radiating through the left side of her face, left eye and left ear. Pain has been intermittent, coming and going at random. Pain worse if she touches the left side of her face and neck. No alleviating factors tried. After the neck pain began, states she started having pain in her left shoulder only when she moves it that radiates across her chest that is worse when she touches her chest. No shortness of breath. Over the past few days, she has been experiencing pain across her upper back described as a soreness, worse with movements. Her feet are feeling tingly. Reports pressure behind her sinuses and her nose over the past few days. Has not taken any decongestants. Denies vision change, photophobia, nausea, vomiting, fever, chills, abdominal pain, cough, dizziness, lightheadedness or confusion.  Patient is a 54 y.o. female presenting with headaches and neck pain. The history is provided by the patient.  Headache Associated symptoms: ear pain, eye pain and neck pain   Neck Pain Associated symptoms: chest pain and headaches     Past Medical History  Diagnosis Date  . Seizures     last seizure age 39 or 10 per pt report  . RA (rheumatoid arthritis)     states was dx at age 79 but not on meds  . H/O uterine prolapse   . Thyroid disorder   . Fibroids     H/O myomectomy  . Anemia 05/2012    transfusion  . Heart murmur   . History of blood transfusion    Past Surgical History   Procedure Laterality Date  . Excision vaginal cyst    . Tubal ligation  10/10/1991  . Myomectomy  11YRS AGO    HYSTEROSCOPIC  . Vaginal hysterectomy N/A 12/10/2012    Procedure: Total Vaginal Hysterectomy;  Surgeon: Eldred Manges, MD;  Location: [facility];  Service: Gynecology;  Laterality: N/A;  . Cystocele repair N/A 12/10/2012    Procedure: ANTERIOR REPAIR (CYSTOCELE);  Surgeon: Eldred Manges, MD;  Location: [facility];  Service: Gynecology;  Laterality: N/A;  . Bladder suspension N/A 12/10/2012    Procedure: TRANSVAGINAL TAPE (TVT) PROCEDURE;  Surgeon: Delice Lesch, MD;  Location: [facility];  Service: Gynecology;  Laterality: N/A;  . Cystoscopy N/A 12/10/2012    Procedure: CYSTOSCOPY;  Surgeon: Delice Lesch, MD;  Location: [facility];  Service: Gynecology;  Laterality: N/A;   Family History  Problem Relation Age of Onset  . Heart failure Father   . Diabetes Mother   . High blood pressure Mother   . Arthritis Mother   . Hypertension Mother   . Breast cancer Maternal Aunt     in her 63's   . Breast cancer Cousin    History  Substance Use Topics  . Smoking status: Never Smoker   . Smokeless tobacco: Never Used  . Alcohol Use: Yes     Comment: rare   OB History    Gravida Para Term Preterm AB TAB SAB Ectopic Multiple Living   2  2 2       2      Review of Systems  HENT: Positive for ear pain.   Eyes: Positive for pain and redness.  Cardiovascular: Positive for chest pain.  Musculoskeletal: Positive for neck pain.  Neurological: Positive for headaches.  All other systems reviewed and are negative.     Allergies  Other; Methimazole; Nitroglycerin; Penicillins; Terbutaline; and Tetracycline hcl  Home Medications   Prior to Admission medications   Medication Sig Start Date End Date Taking? Authorizing Provider  fluticasone (FLONASE) 50 MCG/ACT nasal spray Place 2 sprays into both nostrils daily. 10/29/14   Carman Ching, PA-C  HYDROcodone-acetaminophen (NORCO/VICODIN) 5-325  MG per tablet Take 1-2 tablets by mouth every 4 (four) hours as needed. 08/03/14   Dorie Rank, MD  pantoprazole (PROTONIX) 40 MG tablet Take 1 tablet (40 mg total) by mouth daily. Patient not taking: Reported on 08/03/2014 07/14/14   Biagio Borg, MD   BP 142/95 mmHg  Pulse 93  Temp(Src) 97.9 F (36.6 C) (Oral)  Resp 14  SpO2 97%  LMP 12/03/2012 Physical Exam  Constitutional: She is oriented to person, place, and time. She appears well-developed and well-nourished. No distress.  HENT:  Head: Normocephalic and atraumatic.  Nose: Mucosal edema present. Right sinus exhibits maxillary sinus tenderness and frontal sinus tenderness. Left sinus exhibits maxillary sinus tenderness and frontal sinus tenderness.  Mouth/Throat: Oropharynx is clear and moist.  Eyes: EOM are normal. Pupils are equal, round, and reactive to light.  Mild injection near L lateral canthus. IOP OD- 20 IOP OS- 22 T Neck: Normal range of motion. Neck supple. No JVD present. No spinous process tenderness present. No rigidity. No edema present. No Brudzinski's sign and no Kernig's sign noted.  TTP over L SCM. No edema.  Cardiovascular: Normal rate, regular rhythm, normal heart sounds and intact distal pulses.   No extremity edema.  Pulmonary/Chest: Effort normal and breath sounds normal. No respiratory distress.  TTP across entire chest. No bruising, edema, step-off or rash.  Abdominal: Soft. Bowel sounds are normal. There is no tenderness.  Musculoskeletal: Normal range of motion. She exhibits no edema.  TTP across entire upper back. No edema, bruising or rash.  Neurological: She is alert and oriented to person, place, and time. She has normal strength. No sensory deficit.  Speech fluent, goal oriented. Moves extremities without ataxia. Equal grip strength bilateral.  Skin: Skin is warm and dry. She is not diaphoretic.  Psychiatric: She has a normal mood and affect. Her behavior is normal.  Nursing note and vitals  reviewed.   ED Course  Procedures (including critical care time) Labs Review Labs Reviewed  CBC - Abnormal; Notable for the following:    RBC 5.16 (*)    All other components within normal limits  BASIC METABOLIC PANEL  Randolm Idol, ED    Imaging Review Dg Chest 2 View  10/29/2014   CLINICAL DATA:  Headache. Pain in the eye. Stabbing pain to the left side of the face.  EXAM: CHEST  2 VIEW  COMPARISON:  06/20/2013  FINDINGS: Subsegmental atelectasis at both lung bases. Lungs appear otherwise clear. Cardiac and mediastinal margins appear normal.  No significant pleural effusion observed.  IMPRESSION: 1. Subsegmental atelectasis at both lung bases. Otherwise, no significant abnormalities are observed.   Electronically Signed   By: Van Clines M.D.   On: 10/29/2014 19:43     EKG Interpretation None      MDM   Final diagnoses:  Sinus congestion  Chest wall pain  Left eye pain   Nontoxic appearing, NAD. AF VSS. Patient's pain is all reproducible. Doubt chest pain is cardiac in nature. HEART score 2. Doubt PE. Regarding hypertension, there is mild injection near lateral canthus, no exudate. Eye pressures as stated above. No vision changes. Has an eye doctor and states this happened before and her eye doctor did not have any concern. Regarding facial pain, she has bilateral frontal and maxillary sinus tenderness with obvious nasal mucosal edema. Headache symptoms are most likely coming from sinus congestion. I advised over-the-counter decongest along with Flonase. After receiving Valium in the ED, patient reports her pains have started to significantly improve. I do not feel any further workup or emergent need for admission is necessary at this time the patient is stable for discharge. Advised her to follow-up with her PCP and eye doctor in 2 days. Stable for discharge. Return precautions given. Patient states understanding of treatment care plan and is agreeable.  Carman Ching,  PA-C 10/29/14 2152  Pamella Pert, MD 10/30/14 1153

## 2014-10-29 NOTE — ED Notes (Signed)
Per pt and family member, pt had recent headache and eye pain. Now has stabbing pain to left side of face, ear and neck. No acute distress noted at triage.

## 2014-10-29 NOTE — ED Notes (Signed)
More relaxed since receiving valium.  Encouraged to call for assistance as needed.

## 2014-11-14 ENCOUNTER — Ambulatory Visit: Payer: 59 | Admitting: Internal Medicine

## 2014-11-14 DIAGNOSIS — Z0289 Encounter for other administrative examinations: Secondary | ICD-10-CM

## 2015-01-06 ENCOUNTER — Other Ambulatory Visit (INDEPENDENT_AMBULATORY_CARE_PROVIDER_SITE_OTHER): Payer: 59

## 2015-01-06 ENCOUNTER — Ambulatory Visit (INDEPENDENT_AMBULATORY_CARE_PROVIDER_SITE_OTHER): Payer: 59 | Admitting: Internal Medicine

## 2015-01-06 ENCOUNTER — Encounter: Payer: Self-pay | Admitting: Internal Medicine

## 2015-01-06 VITALS — BP 154/104 | HR 74 | Temp 97.7°F | Resp 16 | Wt 192.0 lb

## 2015-01-06 DIAGNOSIS — E049 Nontoxic goiter, unspecified: Secondary | ICD-10-CM

## 2015-01-06 DIAGNOSIS — E538 Deficiency of other specified B group vitamins: Secondary | ICD-10-CM

## 2015-01-06 LAB — T3, FREE: T3, Free: 4.1 pg/mL (ref 2.3–4.2)

## 2015-01-06 LAB — TSH: TSH: 2.38 u[IU]/mL (ref 0.35–4.50)

## 2015-01-06 LAB — VITAMIN B12: VITAMIN B 12: 276 pg/mL (ref 211–911)

## 2015-01-06 LAB — T4, FREE: Free T4: 0.82 ng/dL (ref 0.60–1.60)

## 2015-01-06 NOTE — Assessment & Plan Note (Signed)
Serial CBCs revealed no anemia  Recheck B12 with methylmalonic acid level

## 2015-01-06 NOTE — Patient Instructions (Signed)
  Your next office appointment will be determined based upon review of your pending labs  and  Ultrasound  Those written interpretation of the lab results and instructions will be transmitted to you by My Chart  Critical results will be called.  Followup as needed for any active or acute issue. Please report any significant change in your symptoms.

## 2015-01-06 NOTE — Assessment & Plan Note (Signed)
Full thyroid function tests  Ultrasound of thyroid

## 2015-01-06 NOTE — Progress Notes (Signed)
Pre visit review using our clinic review tool, if applicable. No additional management support is needed unless otherwise documented below in the visit note. 

## 2015-01-06 NOTE — Progress Notes (Signed)
   Subjective:    Patient ID: April Gonzalez, female    DOB: Jan 27, 1961, 54 y.o.   MRN: [id number]  HPI   She presents for follow-up of her thyroid. 4 years ago she was diagnosed with hyperthyroidism and was to be referred to the Endocrinologist for possible I 131 radiation therapy for ablation. She could not have that treatment at that time as she was keeping her 62-year-old grandson. Tragically he died in a  motor vehicle accident 2 years ago. In fact the anniversary of that tragedy is this week.  She's had neck pain and swelling for years but it's exacerbated the last 3 months .She describes it as a sticking discomfort in the anterior throat which is intermittent but can last hours. Her anterior throat is tender to touch .She also has associated metallic taste.  She describes intermittent blurred vision. She's had some hair loss of the anterior scalp. Her nails are brittle. She describes chest pressure and orthopnea when she is supine. She describes anxiety intermittently. She has a appropriate depression related to the death of her grandson. She has some loose stools. She has heat intolerance. Her weight is up 40 pounds over the last 3-4 years.    Review of Systems She denies palpitations. She has no tremor. There is no constipation.  She does have numbness and tingling in extremities. She's been found to have a  B12 level of 178. Serial CBCs and difs have been normal.  She has a history of diabetes; her A1c was 5.6% in February of this year. At that time her TSH was 4.15 and free T4 0.81.    Objective:   Physical Exam  Pertinent or positive findings include: She has nystagmus with lateral gaze. Oropharynx is crowded. Goiter is palpable over the anterior neck. I can appreciate no nodules. This is tender as are the lateral neck areas. No definite lymphadenopathy is present. She has no upper airway obstruction with hyperventilation. Abdomen is protuberant. Her affect is markedly flat; but she  is intelligent and communicative.  General appearance :adequately nourished; in no distress.  Eyes: No conjunctival inflammation or scleral icterus is present.  Oral exam:  Lips and gums are healthy appearing.There is no oropharyngeal erythema or exudate noted. Dental hygiene is good.  Heart:  Normal rate and regular rhythm. S1 and S2 normal without gallop, murmur, click, rub or other extra sounds    Lungs:Chest clear to auscultation; no wheezes, rhonchi,rales ,or rubs present.No increased work of breathing.   Abdomen: bowel sounds normal, soft and non-tender without masses, organomegaly or hernias noted.  No guarding or rebound.   Vascular : all pulses equal ; no bruits present.  Skin:Warm & dry.  Intact without suspicious lesions or rashes ; no tenting Excellent ;there is no aortic aneurysm present.  onycholysis.  Lymphatic: No lymphadenopathy is noted about the head, neck, axilla.   Neuro: Strength, tone & DTRs normal. No tremor present.     Assessment & Plan:  See Current Assessment & Plan in Problem List under specific Diagnosis

## 2015-01-10 ENCOUNTER — Ambulatory Visit
Admission: RE | Admit: 2015-01-10 | Discharge: 2015-01-10 | Disposition: A | Discharge status: Home / Self Care | Payer: 59 | Source: Ambulatory Visit | Attending: Internal Medicine | Admitting: Internal Medicine

## 2015-01-10 ENCOUNTER — Other Ambulatory Visit: Payer: Self-pay | Admitting: Internal Medicine

## 2015-01-10 DIAGNOSIS — E049 Nontoxic goiter, unspecified: Secondary | ICD-10-CM

## 2015-01-10 LAB — METHYLMALONIC ACID, SERUM: Methylmalonic Acid, Quant: 73 nmol/L — ABNORMAL LOW (ref 87–318)

## 2015-01-12 ENCOUNTER — Telehealth: Payer: Self-pay | Admitting: Internal Medicine

## 2015-01-12 NOTE — Telephone Encounter (Signed)
Would recommend that she proceed to the ER and not wait for an office visit.

## 2015-01-12 NOTE — Telephone Encounter (Signed)
Patient Name: April Gonzalez DOB: 1960/06/19 Initial Comment Caller states underneath her neck is numb and both sides of face. Nurse Assessment Nurse: Marcelline Deist, RN, Lynda Date/Time (Eastern Time): 01/12/2015 2:32:17 PM Confirm and document reason for call. If symptomatic, describe symptoms. ---Caller states the underneath side of her neck is numb and both sides of face. Has pain in her ears. No fever. Symptoms began suddenly. Has the patient traveled out of the country within the last 30 days? ---Not Applicable Does the patient require triage? ---Yes Related visit to physician within the last 2 weeks? ---No Does the PT have any chronic conditions? (i.e. diabetes, asthma, etc.) ---Yes List chronic conditions. ---gouter, thyroid Did the patient indicate they were pregnant? ---No Guidelines Guideline Title Affirmed Question Affirmed Notes Neurologic Deficit Headache (and neurologic deficit) Final Disposition User Go to ED Now Marcelline Deist, RN, Kermit Balo Comments Spoke with office staff to make them aware that patient would rather come in to office than be seen in the ER. Referrals GO TO FACILITY UNDECIDED REFERRED TO PCP OFFICE Disagree/Comply: Disagree Disagree/Comply Reason: Disagree with instructions

## 2015-01-16 ENCOUNTER — Encounter (HOSPITAL_COMMUNITY): Payer: Self-pay | Admitting: Emergency Medicine

## 2015-01-16 ENCOUNTER — Emergency Department (INDEPENDENT_AMBULATORY_CARE_PROVIDER_SITE_OTHER): Payer: 59

## 2015-01-16 ENCOUNTER — Telehealth: Payer: Self-pay | Admitting: Emergency Medicine

## 2015-01-16 ENCOUNTER — Emergency Department (INDEPENDENT_AMBULATORY_CARE_PROVIDER_SITE_OTHER)
Admission: EM | Admit: 2015-01-16 | Discharge: 2015-01-16 | Disposition: A | Discharge status: Home / Self Care | Payer: 59 | Source: Home / Self Care | Attending: Family Medicine | Admitting: Family Medicine

## 2015-01-16 DIAGNOSIS — R109 Unspecified abdominal pain: Secondary | ICD-10-CM

## 2015-01-16 DIAGNOSIS — R197 Diarrhea, unspecified: Secondary | ICD-10-CM | POA: Diagnosis not present

## 2015-01-16 LAB — COMPREHENSIVE METABOLIC PANEL
ALBUMIN: 4 g/dL (ref 3.5–5.0)
ALK PHOS: 99 U/L (ref 38–126)
ALT: 18 U/L (ref 14–54)
AST: 17 U/L (ref 15–41)
Anion gap: 8 (ref 5–15)
BUN: 8 mg/dL (ref 6–20)
CALCIUM: 9.5 mg/dL (ref 8.9–10.3)
CHLORIDE: 103 mmol/L (ref 101–111)
CO2: 27 mmol/L (ref 22–32)
Creatinine, Ser: 0.68 mg/dL (ref 0.44–1.00)
GFR calc Af Amer: >60 mL/min (ref >60)
GFR calc non Af Amer: >60 mL/min (ref >60)
GLUCOSE: 78 mg/dL (ref 65–99)
Potassium: 4 mmol/L (ref 3.5–5.1)
SODIUM: 138 mmol/L (ref 135–145)
Total Bilirubin: 0.6 mg/dL (ref 0.3–1.2)
Total Protein: 7.7 g/dL (ref 6.5–8.1)

## 2015-01-16 LAB — LIPASE, BLOOD: LIPASE: 38 U/L (ref 22–51)

## 2015-01-16 LAB — AMYLASE: AMYLASE: 97 U/L (ref 28–100)

## 2015-01-16 MED ORDER — ONDANSETRON 8 MG PO TBDP: 8.0000 mg | ORAL_TABLET | Freq: Once | ORAL | Status: DC

## 2015-01-16 MED ORDER — ONDANSETRON 4 MG PO TBDP
ORAL_TABLET | ORAL | Status: AC
Start: 2015-01-16 — End: 2015-01-16
  Filled 2015-01-16: qty 2

## 2015-01-16 MED ORDER — ONDANSETRON 4 MG PO TBDP
8.0000 mg | ORAL_TABLET | Freq: Once | ORAL | Status: AC
  Administered 2015-01-16: 8 mg via ORAL

## 2015-01-16 NOTE — ED Provider Notes (Addendum)
CSN: [id number]     Arrival date & time 01/16/15  1320 History   First MD Initiated Contact with Patient 01/16/15 1432     Chief Complaint  Patient presents with  . Nausea  . Abdominal Pain   (Consider location/radiation/quality/duration/timing/severity/associated sxs/prior Treatment) HPI  Abdominal discomfort and nausea. Ongoing for several months. States that she has had in am and episodes of stomach pain though for a year. Current episode became significantly worse over the last week. Associated with diarrhea. Stools are now white in color. Abdominal discomfort is described as though the abdomen with radiation around to the back. Pain is fairly constant with waxing and waning nature. Denies any fevers, dysuria, frequency, vaginal discharge, rash, chest pain, shortness of breath. Patient with 7 pound weight loss over the past week.       Past Medical History  Diagnosis Date  . Seizures     last seizure age 56 or 10 per pt report  . RA (rheumatoid arthritis)     states was dx at age 85 but not on meds  . H/O uterine prolapse   . Thyroid disorder   . Fibroids     H/O myomectomy  . Anemia 05/2012    transfusion  . Heart murmur   . History of blood transfusion    Past Surgical History  Procedure Laterality Date  . Excision vaginal cyst    . Tubal ligation  10/10/1991  . Myomectomy  33YRS AGO    HYSTEROSCOPIC  . Vaginal hysterectomy N/A 12/10/2012    Procedure: Total Vaginal Hysterectomy;  Surgeon: Eldred Manges, MD;  Location: [facility];  Service: Gynecology;  Laterality: N/A;  . Cystocele repair N/A 12/10/2012    Procedure: ANTERIOR REPAIR (CYSTOCELE);  Surgeon: Eldred Manges, MD;  Location: [facility];  Service: Gynecology;  Laterality: N/A;  . Bladder suspension N/A 12/10/2012    Procedure: TRANSVAGINAL TAPE (TVT) PROCEDURE;  Surgeon: Delice Lesch, MD;  Location: [facility];  Service: Gynecology;  Laterality: N/A;  . Cystoscopy N/A 12/10/2012    Procedure: CYSTOSCOPY;  Surgeon:  Delice Lesch, MD;  Location: [facility];  Service: Gynecology;  Laterality: N/A;   Family History  Problem Relation Age of Onset  . Heart failure Father   . Diabetes Mother   . High blood pressure Mother   . Arthritis Mother   . Hypertension Mother   . Breast cancer Maternal Aunt     in her 33's   . Breast cancer Cousin    Social History  Substance Use Topics  . Smoking status: Never Smoker   . Smokeless tobacco: Never Used  . Alcohol Use: Yes     Comment: rare   OB History    Gravida Para Term Preterm AB TAB SAB Ectopic Multiple Living   2 2 2       2      Review of Systems Per HPI with all other pertinent systems negative.   Allergies  Other; Methimazole; Nitroglycerin; Penicillins; Terbutaline; and Tetracycline hcl  Home Medications   Prior to Admission medications   Medication Sig Start Date End Date Taking? Authorizing Provider  ondansetron (ZOFRAN-ODT) 8 MG disintegrating tablet Take 1 tablet (8 mg total) by mouth once. 01/16/15   Waldemar Dickens, MD   Meds Ordered and Administered this Visit   Medications  ondansetron (ZOFRAN-ODT) disintegrating tablet 8 mg (8 mg Oral Given 01/16/15 1450)    BP 152/92 mmHg  Pulse 74  Temp(Src) 97.8 F (36.6 C) (Oral)  Resp 16  SpO2 96%  LMP 12/03/2012 No data found.   Physical Exam Physical Exam  Constitutional: oriented to person, place, and time. appears well-developed and well-nourished. No distress.  HENT:  Head: Normocephalic and atraumatic.  Eyes: EOMI. PERRL.  Neck: Normal range of motion.  Cardiovascular: RRR, no m/r/g, 2+ distal pulses,  Pulmonary/Chest: Effort normal and breath sounds normal. No respiratory distress.  Abdominal: Mild diffuse abdominal discomfort from the umbilicus to lower abdominal regions right and left. Negative Murphy sign, nontender at McBurney's point.  Musculoskeletal: Normal range of motion. Non ttp, no effusion.  Neurological: alert and oriented to person, place, and time.  Skin:  Skin is warm. No rash noted. non diaphoretic.  Psychiatric: normal mood and affect. behavior is normal. Judgment and thought content normal.   ED Course  Procedures (including critical care time)  Labs Review Labs Reviewed  COMPREHENSIVE METABOLIC PANEL  AMYLASE  LIPASE, BLOOD    Imaging Review No results found.   Visual Acuity Review  Right Eye Distance:   Left Eye Distance:   Bilateral Distance:    Right Eye Near:   Left Eye Near:    Bilateral Near:         MDM   1. Diarrhea   2. Abdominal discomfort    Etiology not immediately clear. KUB fairly unremarkable. Amylase, lipase, cemented sent for further review. Discussed case with patient's primary care physician's office and they have agreed to see patient tomorrow 3:15 for additional recommendations, lab review, and possible imaging.  KUB consistent w/ large stool burden. May have underlying constipation as a major cause of symptoms   Waldemar Dickens, MD 01/16/15 1514  Waldemar Dickens, MD 01/16/15 [phone]

## 2015-01-16 NOTE — Telephone Encounter (Signed)
Spoke with Dr Marily Memos from Urgent Care. He stated that pt came in with abdominal pain, nausea, weight loss, and diarrhea, he is concerned that it may be something with her pancreas. He stated that he felt that is was necessary for pt to be seen tomorrow with Dr Linna Darner. Pt was placed in a 15 min slot because there was not any other openings.

## 2015-01-16 NOTE — ED Notes (Signed)
C/o abd pain States she has lower abd pain for months now States pain is worst States she sees an endocrinologist on Friday for goiter hx

## 2015-01-16 NOTE — Discharge Instructions (Signed)
The cause of your abdominal discomfort is not immediately clear. This may be due to several different things including infection, abnormality of one of your organs. Please use the Zofran for discomfort nausea. Please follow-up with your primary care physician Dr. Linna Darner tomorrow at 3:15. Your lab work should be available for review at that point time.

## 2015-01-17 ENCOUNTER — Ambulatory Visit (INDEPENDENT_AMBULATORY_CARE_PROVIDER_SITE_OTHER): Payer: 59 | Admitting: Internal Medicine

## 2015-01-17 ENCOUNTER — Other Ambulatory Visit (INDEPENDENT_AMBULATORY_CARE_PROVIDER_SITE_OTHER): Payer: 59

## 2015-01-17 ENCOUNTER — Encounter: Payer: Self-pay | Admitting: Internal Medicine

## 2015-01-17 VITALS — BP 134/92 | HR 76 | Temp 98.3°F | Resp 16 | Wt 193.0 lb

## 2015-01-17 DIAGNOSIS — R1084 Generalized abdominal pain: Secondary | ICD-10-CM | POA: Diagnosis not present

## 2015-01-17 DIAGNOSIS — E785 Hyperlipidemia, unspecified: Secondary | ICD-10-CM

## 2015-01-17 DIAGNOSIS — I2510 Atherosclerotic heart disease of native coronary artery without angina pectoris: Secondary | ICD-10-CM | POA: Diagnosis not present

## 2015-01-17 DIAGNOSIS — K921 Melena: Secondary | ICD-10-CM

## 2015-01-17 DIAGNOSIS — N200 Calculus of kidney: Secondary | ICD-10-CM

## 2015-01-17 LAB — CBC WITH DIFFERENTIAL/PLATELET
BASOS ABS: 0 10*3/uL (ref 0.0–0.1)
Basophils Relative: 0.3 % (ref 0.0–3.0)
EOS PCT: 3.6 % (ref 0.0–5.0)
Eosinophils Absolute: 0.3 10*3/uL (ref 0.0–0.7)
HCT: 42.8 % (ref 36.0–46.0)
Hemoglobin: 14.5 g/dL (ref 12.0–15.0)
LYMPHS ABS: 2.2 10*3/uL (ref 0.7–4.0)
Lymphocytes Relative: 25.5 % (ref 12.0–46.0)
MCHC: 33.7 g/dL (ref 30.0–36.0)
MCV: 85.1 fl (ref 78.0–100.0)
MONOS PCT: 7.3 % (ref 3.0–12.0)
Monocytes Absolute: 0.6 10*3/uL (ref 0.1–1.0)
NEUTROS ABS: 5.4 10*3/uL (ref 1.4–7.7)
NEUTROS PCT: 63.3 % (ref 43.0–77.0)
PLATELETS: 257 10*3/uL (ref 150.0–400.0)
RBC: 5.03 Mil/uL (ref 3.87–5.11)
RDW: 13 % (ref 11.5–15.5)
WBC: 8.6 10*3/uL (ref 4.0–10.5)

## 2015-01-17 LAB — URINALYSIS
BILIRUBIN URINE: NEGATIVE
KETONES UR: NEGATIVE
Leukocytes, UA: NEGATIVE
Nitrite: NEGATIVE
PH: 6 (ref 5.0–8.0)
Specific Gravity, Urine: 1.015 (ref 1.000–1.030)
TOTAL PROTEIN, URINE-UPE24: NEGATIVE
URINE GLUCOSE: NEGATIVE
Urobilinogen, UA: 0.2 (ref 0.0–1.0)

## 2015-01-17 LAB — LIPASE: Lipase: 48 U/L (ref 11.0–59.0)

## 2015-01-17 MED ORDER — ATORVASTATIN CALCIUM 10 MG PO TABS: 10.0000 mg | ORAL_TABLET | Freq: Every day | ORAL | Status: DC

## 2015-01-17 NOTE — Progress Notes (Signed)
   Subjective:    Patient ID: April Gonzalez, female    DOB: 1960/06/25, 54 y.o.   MRN: [id number]  HPI She was seen in the emergency room 01/16/15 to evaluate abdominal pain which she claims has been present for up to 7 years. It has exacerbated in the last 3-4 months. It is described as sharp with radiation to the lower back. This has been associated with loose/diarrheal stool which is pale/beige in color.  Comprehensive metabolic profile was performed. This revealed normal hepatic function test as well as normal amylase and lipase. The last CBC and differential on record was 10/29/14. There was no anemia or white count elevation.  She had a renal protocol CT of the abdomen and pelvis without contrast 08/03/14. This revealed left nephrolithiasis without hydronephrosis. She also had atherosclerosis involving coronary arteries.  From 2010 to the present she's had 8 CT exams including 3 of the abdomen/pelvis. To date she has not had a colonoscopy.  Her last lipids on record were 06/21/14 with an LDL of 157.  She was recently seen for neck discomfort. Ultrasound suggested possible early goiter. Full thyroid function tests were normal  She tragically lost her grandson in a motor vehicle accident. She states that she sought counseling from a [place] priest. She denies severe depression or suicidal ideation.  Review of Systems  There is no significant cough, sputum production,hemoptysis, wheezing,or  paroxysmal nocturnal dyspnea. Unexplained weight loss, significant dyspepsia, dysphagia, melena, rectal bleeding, or persistently small caliber stools are not present. Dysuria, pyuria, hematuria, frequency, nocturia or polyuria are denied.    Objective:   Physical Exam Pertinent or positive findings include: Her abdomen is soft with normal bowel sounds ;but she's exquisitely tender to palpation in all quadrants. She also jumped in pain when range of motion of the left lower extremity was  checked.   General appearance :adequately nourished; in no distress.  Eyes: No conjunctival inflammation or scleral icterus is present.  Oral exam:  Lips and gums are healthy appearing.There is no oropharyngeal erythema or exudate noted. Dental hygiene is good.  Heart:  Normal rate and regular rhythm. S1 and S2 normal without gallop, murmur, click, rub or other extra sounds    Lungs:Chest clear to auscultation; no wheezes, rhonchi,rales ,or rubs present.No increased work of breathing.    Vascular : all pulses equal ; no bruits present.  Skin:Warm & dry.  Intact without suspicious lesions or rashes ; no tenting    Lymphatic: No lymphadenopathy is noted about the head, neck, axilla.   Neuro: Strength, tone & DTRs normal.      Assessment & Plan:  #1 chronic diffuse abdominal pain associated with bowel changes  #2 evidence of atherosclerosis on CT scan without contrast 08/03/14. Rule out ischemic bowel. Clinically that is not present. I have encouraged her to take a baby coated aspirin as well as a statin because of the documented coronary artery calcification.  # 3 possible early goiter with normal thyroid function tests in absence of nodules  #4 family tragedy with loss of grandson; she denies profound depression or suicidal ideation  Plan: See orders and recommendations

## 2015-01-17 NOTE — Patient Instructions (Addendum)
  Your next office appointment will be determined based upon review of your pending labs.  Those written interpretation of the lab results and instructions will be transmitted to you by My Chart   Critical results will be called.   Followup as needed for any active or acute issue. Please report any significant change in your symptoms.   Please take enteric-coated aspirin 81 mg daily with breakfast.

## 2015-01-17 NOTE — Progress Notes (Signed)
Pre visit review using our clinic review tool, if applicable. No additional management support is needed unless otherwise documented below in the visit note. 

## 2015-01-20 ENCOUNTER — Ambulatory Visit (INDEPENDENT_AMBULATORY_CARE_PROVIDER_SITE_OTHER): Payer: 59 | Admitting: Endocrinology

## 2015-01-20 ENCOUNTER — Telehealth: Payer: Self-pay | Admitting: *Deleted

## 2015-01-20 ENCOUNTER — Encounter: Payer: Self-pay | Admitting: Endocrinology

## 2015-01-20 VITALS — BP 136/94 | HR 68 | Temp 97.6°F | Ht 63.0 in | Wt 193.0 lb

## 2015-01-20 DIAGNOSIS — E049 Nontoxic goiter, unspecified: Secondary | ICD-10-CM

## 2015-01-20 MED ORDER — FUROSEMIDE 20 MG PO TABS: 20.0000 mg | ORAL_TABLET | Freq: Every day | ORAL | Status: DC

## 2015-01-20 NOTE — Progress Notes (Signed)
Subjective:    Patient ID: April Gonzalez, female    DOB: [birth year], 54 y.o.   MRN: [id number]  HPI Pt was noted to have an enlarged thyroid in 2013.  She reported slight pain at the anterior neck, and assoc swelling.  she is unaware of any prior thyroid problems.  He has no h/o XRT or surgery to the neck.   Past Medical History  Diagnosis Date  . Seizures     last seizure age 60 or 10 per pt report  . RA (rheumatoid arthritis)     states was dx at age 17 but not on meds  . H/O uterine prolapse   . Thyroid disorder   . Fibroids     H/O myomectomy  . Anemia 05/2012    transfusion  . Heart murmur   . History of blood transfusion     Past Surgical History  Procedure Laterality Date  . Excision vaginal cyst    . Tubal ligation  10/10/1991  . Myomectomy  98YRS AGO    HYSTEROSCOPIC  . Vaginal hysterectomy N/A 12/10/2012    Procedure: Total Vaginal Hysterectomy;  Surgeon: Eldred Manges, MD;  Location: [facility];  Service: Gynecology;  Laterality: N/A;  . Cystocele repair N/A 12/10/2012    Procedure: ANTERIOR REPAIR (CYSTOCELE);  Surgeon: Eldred Manges, MD;  Location: [facility];  Service: Gynecology;  Laterality: N/A;  . Bladder suspension N/A 12/10/2012    Procedure: TRANSVAGINAL TAPE (TVT) PROCEDURE;  Surgeon: Delice Lesch, MD;  Location: [facility];  Service: Gynecology;  Laterality: N/A;  . Cystoscopy N/A 12/10/2012    Procedure: CYSTOSCOPY;  Surgeon: Delice Lesch, MD;  Location: [facility];  Service: Gynecology;  Laterality: N/A;    Social History   Social History  . Marital Status: Married    Spouse Name: N/A  . Number of Children: 2  . Years of Education: 15   Occupational History  . Not on file.   Social History Main Topics  . Smoking status: Never Smoker   . Smokeless tobacco: Never Used  . Alcohol Use: Yes     Comment: rare  . Drug Use: No  . Sexual Activity: Yes    Birth Control/ Protection: Surgical     Comment: BTL    Other Topics Concern  . Not on file    Social History Narrative   Regular exercise-no   Caffeine Use-yes    Current Outpatient Prescriptions on File Prior to Visit  Medication Sig Dispense Refill  . aspirin EC 81 MG tablet Take 1 tablet (81 mg total) by mouth daily.    Marland Kitchen atorvastatin (LIPITOR) 10 MG tablet Take 1 tablet (10 mg total) by mouth daily. 30 tablet 2  . ondansetron (ZOFRAN-ODT) 8 MG disintegrating tablet Take 1 tablet (8 mg total) by mouth once. 20 tablet 0   No current facility-administered medications on file prior to visit.    Allergies  Allergen Reactions  . Other     Seasonal / dog hair   . Methimazole     REACTION: anaphylaxis  . Nitroglycerin     REACTION: sweating  . Penicillins     unknown  . Terbutaline   . Tetracycline Hcl     REACTION: elevated blood pressure    Family History  Problem Relation Age of Onset  . Heart failure Father   . Diabetes Mother   . High blood pressure Mother   . Arthritis Mother   . Hypertension Mother   .  Breast cancer Maternal Aunt     in her 52's   . Breast cancer Cousin   . Thyroid disease Sister     BP 136/94 mmHg  Pulse 68  Temp(Src) 97.6 F (36.4 C) (Oral)  Ht 5\' 3"  (1.6 m)  Wt 193 lb (87.544 kg)  BMI 34.20 kg/m2  SpO2 94%  LMP 12/03/2012    Review of Systems She has weight gain, generalized body pain, headache, fever, depression, intermittent sob, abd pain, diffuse muscle weakness, diarrhea, easy bruising, nausea, rhinorrhea, numbness, and edema.  She denies rash, visual loss, urinary frequency, excessive diaphoresis, and cold intolerance.    Objective:   Physical Exam VS: see vs page GEN: no distress HEAD: head: no deformity eyes: no periorbital swelling, no proptosis external nose and ears are normal mouth: no lesion seen NECK: thyroid is slightly and diffusely enlarged.  There is exquisite sensitivity to very light palpation of the thyroid area. CHEST WALL: no deformity LUNGS:  Clear to auscultation.   CV: reg rate and rhythm,  no murmur ABD: abdomen is soft, nontender.  no hepatosplenomegaly.  not distended.  no hernia MUSCULOSKELETAL: muscle bulk and strength are grossly normal.  no obvious joint swelling.  gait is normal and steady EXTEMITIES: no deformity.  no ulcer on the feet.  feet are of normal color and temp.  no edema PULSES: dorsalis pedis intact bilat.   NEURO:  cn 2-12 grossly intact.   readily moves all 4's.  sensation is intact to touch on the feet SKIN:  Normal texture and temperature.  No rash or suspicious lesion is visible.   NODES:  None palpable at the neck PSYCH: alert, well-oriented.  Does not appear anxious nor depressed.   Lab Results  Component Value Date   TSH 2.38 01/06/2015   Radiol: thyroid US (01/10/15): Stable heterogeneity of the thyroid tissue with multiple hypoechoic foci but no discrete nodule  I have reviewed outside records, and summarized: Pt was noted to have neck sxs and goiter, and referred here.      Assessment & Plan:  Small goiter: new.  She is at risk for abnormal thyroid function.   Neck pain, and other sxs, not thyroid-related.  She should have evan as clinically indicated.    Patient is advised the following: Patient Instructions  As your thyroid level is normal now, the thyroid needs no treatment. However, you have a slightly enlarged thyroid.  Therefore, your thyroid could become overactive or (more common) underactive in the future. You should have Dr Linna Darner examine your thyroid and check the thyroid blood test each year. Please continue to work with Dr Linna Darner about your symptoms.   I would be happy to see you back here as necessary.

## 2015-01-20 NOTE — Telephone Encounter (Signed)
Receive call pt states she is still having edema sxs. She don't know why she is gaining weight. Her body just feel swollen. She states every morning when she get up her feet & hands are swollen, fingers very tight she can't put her rings on sometimes. She states saw Dr. Loanne Drilling this morning, but he didn't do anything he told her to f/u with Dr. Linna Darner. Requesting md advisement on the swelling & would like to see another endocrinologist because she feel like something is wrong...Johny Chess

## 2015-01-20 NOTE — Patient Instructions (Addendum)
As your thyroid level is normal now, the thyroid needs no treatment. However, you have a slightly enlarged thyroid.  Therefore, your thyroid could become overactive or (more common) underactive in the future. You should have Dr Linna Darner examine your thyroid and check the thyroid blood test each year. Please continue to work with Dr Linna Darner about your symptoms.   I would be happy to see you back here as necessary.

## 2015-01-20 NOTE — Telephone Encounter (Signed)
Called pt no answer LMOM with md response. Sent furosemide to [facility]...April Gonzalez

## 2015-01-20 NOTE — Telephone Encounter (Signed)
Restrict sodium  Furosemide 20 mg qd prn #30 Lucy, her PCP is Dr Doug Sou, not I .Please have name changed . Thanks, [employer]

## 2015-01-23 ENCOUNTER — Telehealth: Payer: Self-pay | Admitting: Internal Medicine

## 2015-02-06 ENCOUNTER — Encounter (HOSPITAL_COMMUNITY): Payer: Self-pay | Admitting: Emergency Medicine

## 2015-02-06 ENCOUNTER — Emergency Department (INDEPENDENT_AMBULATORY_CARE_PROVIDER_SITE_OTHER)
Admission: EM | Admit: 2015-02-06 | Discharge: 2015-02-06 | Disposition: A | Discharge status: Home / Self Care | Payer: 59 | Source: Home / Self Care

## 2015-02-06 DIAGNOSIS — J029 Acute pharyngitis, unspecified: Secondary | ICD-10-CM

## 2015-02-06 DIAGNOSIS — R0982 Postnasal drip: Secondary | ICD-10-CM

## 2015-02-06 DIAGNOSIS — H6983 Other specified disorders of Eustachian tube, bilateral: Secondary | ICD-10-CM | POA: Diagnosis not present

## 2015-02-06 LAB — POCT RAPID STREP A: STREPTOCOCCUS, GROUP A SCREEN (DIRECT): NEGATIVE

## 2015-02-06 MED ORDER — FLUTICASONE PROPIONATE 50 MCG/ACT NA SUSP: 2.0000 | Freq: Every day | NASAL | Status: DC

## 2015-02-06 NOTE — ED Provider Notes (Signed)
CSN: [id number]     Arrival date & time 02/06/15  1401 History   None    Chief Complaint  Patient presents with  . Otalgia    bilateral  . Neck Pain  . Sore Throat   (Consider location/radiation/quality/duration/timing/severity/associated sxs/prior Treatment) Patient is a 54 y.o. female presenting with ear pain, neck pain, and pharyngitis. The history is provided by the patient.  Otalgia Affected ear: Throat. Severity:  Moderate Onset quality:  Sudden Duration:  1 day Timing:  Constant Progression:  Worsening Associated symptoms: neck pain and sore throat   Neck Pain Sore Throat    Past Medical History  Diagnosis Date  . Seizures ([place])     last seizure age 61 or 10 per pt report  . RA (rheumatoid arthritis) ([facility])     states was dx at age 36 but not on meds  . H/O uterine prolapse   . Thyroid disorder   . Fibroids     H/O myomectomy  . Anemia 05/2012    transfusion  . Heart murmur   . History of blood transfusion    Past Surgical History  Procedure Laterality Date  . Excision vaginal cyst    . Tubal ligation  10/10/1991  . Myomectomy  47YRS AGO    HYSTEROSCOPIC  . Vaginal hysterectomy N/A 12/10/2012    Procedure: Total Vaginal Hysterectomy;  Surgeon: Eldred Manges, MD;  Location: [facility];  Service: Gynecology;  Laterality: N/A;  . Cystocele repair N/A 12/10/2012    Procedure: ANTERIOR REPAIR (CYSTOCELE);  Surgeon: Eldred Manges, MD;  Location: [facility];  Service: Gynecology;  Laterality: N/A;  . Bladder suspension N/A 12/10/2012    Procedure: TRANSVAGINAL TAPE (TVT) PROCEDURE;  Surgeon: Delice Lesch, MD;  Location: [facility];  Service: Gynecology;  Laterality: N/A;  . Cystoscopy N/A 12/10/2012    Procedure: CYSTOSCOPY;  Surgeon: Delice Lesch, MD;  Location: [facility];  Service: Gynecology;  Laterality: N/A;   Family History  Problem Relation Age of Onset  . Heart failure Father   . Diabetes Mother   . High blood pressure Mother   . Arthritis Mother   .  Hypertension Mother   . Breast cancer Maternal Aunt     in her 57's   . Breast cancer Cousin   . Thyroid disease Sister    Social History  Substance Use Topics  . Smoking status: Never Smoker   . Smokeless tobacco: Never Used  . Alcohol Use: Yes     Comment: rare   OB History    Gravida Para Term Preterm AB TAB SAB Ectopic Multiple Living   2 2 2       2      Review of Systems  Constitutional: Negative.   HENT: Positive for ear pain and sore throat.   Eyes: Negative.   Respiratory: Negative.   Cardiovascular: Negative.   Gastrointestinal: Negative.   Endocrine: Negative.   Genitourinary: Negative.   Musculoskeletal: Positive for neck pain.  Skin: Negative.   Allergic/Immunologic: Negative.   Neurological: Negative.   Hematological: Negative.   Psychiatric/Behavioral: Negative.     Allergies  Other; Methimazole; Nitroglycerin; Penicillins; Terbutaline; and Tetracycline hcl  Home Medications   Prior to Admission medications   Medication Sig Start Date End Date Taking? Authorizing Provider  atorvastatin (LIPITOR) 10 MG tablet Take 1 tablet (10 mg total) by mouth daily. 01/17/15  Yes Hendricks Limes, MD  ondansetron (ZOFRAN-ODT) 8 MG disintegrating tablet Take 1 tablet (8 mg total)  by mouth once. 01/16/15  Yes Waldemar Dickens, MD  aspirin EC 81 MG tablet Take 1 tablet (81 mg total) by mouth daily. 01/17/15   Hendricks Limes, MD  furosemide (LASIX) 20 MG tablet Take 1 tablet (20 mg total) by mouth daily. 01/20/15   Hendricks Limes, MD   Meds Ordered and Administered this Visit  Medications - No data to display  LMP 12/03/2012 No data found.   Physical Exam  Constitutional: She is oriented to person, place, and time. She appears well-developed and well-nourished.  HENT:  Head: Normocephalic and atraumatic.  Right Ear: External ear normal.  Left Ear: External ear normal.  Nose: Nose normal.  Mouth/Throat: Oropharynx is clear and moist.  Eyes: Conjunctivae and EOM  are normal. Pupils are equal, round, and reactive to light.  Neck: Normal range of motion. Neck supple.  Cardiovascular: Normal rate, regular rhythm and normal heart sounds.   Pulmonary/Chest: Effort normal and breath sounds normal.  Abdominal: Soft. Bowel sounds are normal.  Musculoskeletal: Normal range of motion.  Neurological: She is alert and oriented to person, place, and time.  Skin: Skin is warm and dry.    ED Course  Procedures (including critical care time)  Labs Review Labs Reviewed - No data to display  Imaging Review No results found.   Visual Acuity Review  Right Eye Distance:   Left Eye Distance:   Bilateral Distance:    Right Eye Near:   Left Eye Near:    Bilateral Near:         MDM  No diagnosis found.     Lysbeth Penner, FNP 02/06/15 [place], [place] 02/06/15 [phone]

## 2015-02-06 NOTE — ED Notes (Signed)
Pt reports a history of Goiter.  She is having some pain and discomfort in her neck, throat, and ears, bilaterally.  She reports a white growth on her tonsil as well.  Pt has not followed up with anyone about her thyroid issues.  She also reports a fever, but did not measure it at home.

## 2015-02-08 LAB — CULTURE, GROUP A STREP: Strep A Culture: NEGATIVE

## 2015-02-17 ENCOUNTER — Telehealth: Payer: Self-pay | Admitting: Internal Medicine

## 2015-02-17 NOTE — Telephone Encounter (Signed)
Please advise 

## 2015-02-17 NOTE — Telephone Encounter (Signed)
Pt is complaining of being in pretty bad pain and hard time breathing because of her goiter. She was wanting to know if you have some kind of plan of treatment. I asked her about her appointment with Dr. Loanne Drilling and she stated that she would like to see Dr. Dwyane Dee instead. Can you please call her.

## 2015-02-17 NOTE — Telephone Encounter (Signed)
If she does not want to see Dr Loanne Drilling; external referral would be necessary.They do not usually see each other's patients

## 2015-02-20 NOTE — Telephone Encounter (Signed)
Called to inform pt. Pt is going to call their office to verify if Dr Dwyane Dee is okay with seeing her and will call back to let us know so a separate referral can be placed.

## 2015-02-23 ENCOUNTER — Other Ambulatory Visit: Payer: Self-pay | Admitting: Emergency Medicine

## 2015-02-23 NOTE — Telephone Encounter (Signed)
LVM to verify which pharmacy pt needs her refill for Lasix to go to.

## 2015-02-27 ENCOUNTER — Telehealth: Payer: Self-pay | Admitting: Internal Medicine

## 2015-02-27 NOTE — Telephone Encounter (Signed)
Patient states she is returning your call

## 2015-02-27 NOTE — Telephone Encounter (Signed)
Error

## 2015-02-27 NOTE — Telephone Encounter (Signed)
[name]  Patient Name: April Gonzalez  DOB: March 30, 1961    Initial Comment Taking medication and it's making her shake, and her hair is thinning.    Nurse Assessment  Nurse: Wynetta Emery, RN, Baker Janus Date/Time April Gonzalez Time): 02/27/2015 4:58:58 PM  Confirm and document reason for call. If symptomatic, describe symptoms. ---April Gonzalez has been taking lipitor for 1 month and 4 days and it is making her hair thin out and noting she is shaking. onset this am.  Has the patient traveled out of the country within the last 30 days? ---No  Does the patient have any new or worsening symptoms? ---Yes  Will a triage be completed? ---Yes  Related visit to physician within the last 2 weeks? ---Yes  Does the PT have any chronic conditions? (i.e. diabetes, asthma, etc.) ---Yes  List chronic conditions. ---Hyperlipidemia  Did the patient indicate they were pregnant? ---No     Guidelines    Guideline Title Affirmed Question Affirmed Notes       Final Disposition User        Comments  Side effects of lipitor not one states hair loss it may be a combination of her medications together. www.[email or web address].com/side effects These side effects have happened only to a small number of people. Your doctor can monitor you for them. These side effects usually go away if your dose is lowered or if LIPITOR is stopped. These serious side effects include: Muscle problems. LIPITOR can cause serious muscle problems that can lead to kidney problems, including kidney failure. You have a higher chance for muscle problems if you are taking certain other medicines with LIPITOR. Liver problems. Your doctor should do blood tests to check your liver before you start taking LIPITOR and if you have symptoms of liver problems while you take LIPITOR. Call your doctor right away if you have the following symptoms of liver problems: Feel tired or weak Loss of appetite Upper  belly pain Dark, amber-colored urine Yellowing of your skin or the whites of your eyes You have muscle problems like weakness, tenderness, or pain that happens without a good reason, especially if you also have a fever or feel more tired than usual. You have muscle problems that do not go away even after your doctor has advised you to stop taking LIPITOR. Your doctor may do further tests to diagnose the cause of your muscle problems. You have allergic reactions including swelling of the face, lips, tongue, and/or throat that may cause difficulty in breathing or swallowing, which may require treatment right away. You experience nausea and vomiting. You pass brown or dark-colored urine. You feel more tired than usual. Your skin and whites of your eyes get yellow. You have stomach pain. You have an allergic skin reaction. Nurse advised that she call back on Wednesday when she gets back [place] of [place].

## 2015-02-28 ENCOUNTER — Other Ambulatory Visit: Payer: Self-pay | Admitting: Emergency Medicine

## 2015-02-28 MED ORDER — FUROSEMIDE 20 MG PO TABS: 20.0000 mg | ORAL_TABLET | Freq: Every day | ORAL | Status: DC

## 2015-02-28 NOTE — Telephone Encounter (Signed)
LVM returning pt's call

## 2015-03-02 ENCOUNTER — Other Ambulatory Visit: Payer: Self-pay | Admitting: Internal Medicine

## 2015-03-02 ENCOUNTER — Telehealth: Payer: Self-pay | Admitting: Emergency Medicine

## 2015-03-02 DIAGNOSIS — E785 Hyperlipidemia, unspecified: Secondary | ICD-10-CM

## 2015-03-02 NOTE — Telephone Encounter (Signed)
LVM informing pt

## 2015-03-02 NOTE — Telephone Encounter (Signed)
She needs to stay on it long enough to get fasting labs. Order is entered. Once the labs are drawn she can stop it and follow-up with me.

## 2015-03-02 NOTE — Telephone Encounter (Signed)
Pt called in, she would like to know if she still needs to take the Lipitor due to the side effects she is having. Please advise

## 2015-03-05 ENCOUNTER — Encounter (HOSPITAL_COMMUNITY): Payer: Self-pay | Admitting: Emergency Medicine

## 2015-03-05 ENCOUNTER — Emergency Department (HOSPITAL_COMMUNITY): Payer: 59

## 2015-03-05 ENCOUNTER — Emergency Department (HOSPITAL_COMMUNITY)
Admission: EM | Admit: 2015-03-05 | Discharge: 2015-03-06 | Disposition: A | Discharge status: Home / Self Care | Payer: 59 | Attending: Emergency Medicine | Admitting: Emergency Medicine

## 2015-03-05 DIAGNOSIS — Z7951 Long term (current) use of inhaled steroids: Secondary | ICD-10-CM | POA: Insufficient documentation

## 2015-03-05 DIAGNOSIS — Z8669 Personal history of other diseases of the nervous system and sense organs: Secondary | ICD-10-CM | POA: Diagnosis not present

## 2015-03-05 DIAGNOSIS — Z7982 Long term (current) use of aspirin: Secondary | ICD-10-CM | POA: Diagnosis not present

## 2015-03-05 DIAGNOSIS — Z880 Allergy status to penicillin: Secondary | ICD-10-CM | POA: Diagnosis not present

## 2015-03-05 DIAGNOSIS — M069 Rheumatoid arthritis, unspecified: Secondary | ICD-10-CM | POA: Diagnosis not present

## 2015-03-05 DIAGNOSIS — R011 Cardiac murmur, unspecified: Secondary | ICD-10-CM | POA: Insufficient documentation

## 2015-03-05 DIAGNOSIS — Z8639 Personal history of other endocrine, nutritional and metabolic disease: Secondary | ICD-10-CM | POA: Insufficient documentation

## 2015-03-05 DIAGNOSIS — Z862 Personal history of diseases of the blood and blood-forming organs and certain disorders involving the immune mechanism: Secondary | ICD-10-CM | POA: Diagnosis not present

## 2015-03-05 DIAGNOSIS — Z79899 Other long term (current) drug therapy: Secondary | ICD-10-CM | POA: Insufficient documentation

## 2015-03-05 DIAGNOSIS — R55 Syncope and collapse: Secondary | ICD-10-CM | POA: Diagnosis not present

## 2015-03-05 DIAGNOSIS — R51 Headache: Secondary | ICD-10-CM | POA: Insufficient documentation

## 2015-03-05 DIAGNOSIS — Z8742 Personal history of other diseases of the female genital tract: Secondary | ICD-10-CM | POA: Diagnosis not present

## 2015-03-05 DIAGNOSIS — Z86018 Personal history of other benign neoplasm: Secondary | ICD-10-CM | POA: Diagnosis not present

## 2015-03-05 DIAGNOSIS — R200 Anesthesia of skin: Secondary | ICD-10-CM | POA: Diagnosis not present

## 2015-03-05 DIAGNOSIS — R251 Tremor, unspecified: Secondary | ICD-10-CM | POA: Diagnosis not present

## 2015-03-05 LAB — BASIC METABOLIC PANEL
Anion gap: 7 (ref 5–15)
BUN: 10 mg/dL (ref 6–20)
CALCIUM: 9.4 mg/dL (ref 8.9–10.3)
CO2: 28 mmol/L (ref 22–32)
CREATININE: 0.88 mg/dL (ref 0.44–1.00)
Chloride: 99 mmol/L — ABNORMAL LOW (ref 101–111)
GFR calc non Af Amer: >60 mL/min (ref >60)
Glucose, Bld: 101 mg/dL — ABNORMAL HIGH (ref 65–99)
Potassium: 4.3 mmol/L (ref 3.5–5.1)
SODIUM: 134 mmol/L — AB (ref 135–145)

## 2015-03-05 LAB — CBG MONITORING, ED: GLUCOSE-CAPILLARY: 103 mg/dL — AB (ref 65–99)

## 2015-03-05 MED ORDER — SODIUM CHLORIDE 0.9 % IV BOLUS (SEPSIS)
1000.0000 mL | Freq: Once | INTRAVENOUS | Status: AC
  Administered 2015-03-05: 1000 mL via INTRAVENOUS

## 2015-03-05 NOTE — ED Notes (Signed)
Per EMS family reported to them that patient was walking across the floor and suddenly acted as if she was going to fall.  Lowered to the floor.  Per family reports trembling and shaking all over.  Lasted about 4 minutes.  Pt does not remember anything after being sat on floor.  Unsure of LOC.  Reports having seizures as a child but not since.  Also c/o right shoulder pain rated at 3/10.  Alert and answering appropriately during triage.  [id number].

## 2015-03-05 NOTE — ED Provider Notes (Signed)
Arrival Date & Time: 03/05/15 & 2130 History   Chief Complaint  Patient presents with  . Near Syncope   HPI April Gonzalez is a 54 y.o. female with concerns for near syncope event while patient sitting in high chair with family at supper. Patient had one wine cooler and was acting at baseline without symptoms prior to event when patient leaned over like "to fall" per the family who grabbed her to prevent her to fall. Associated numbness in extremities with mild HA that was mild in onset and intensity. Possible LOC. Shaking was mild and described as a shivering not convulsive or tonic clonic event. No or coughing or choking events. No CP. No blood per rectum or from mouth or vagina.  Past Medical History  I reviewed & agree with nursing's documentation on PMHx, PSHx, SHx and FHx. Past Medical History  Diagnosis Date  . Seizures ([place])     last seizure age 25 or 10 per pt report  . RA (rheumatoid arthritis) ([facility])     states was dx at age 50 but not on meds  . H/O uterine prolapse   . Thyroid disorder   . Fibroids     H/O myomectomy  . Anemia 05/2012    transfusion  . Heart murmur   . History of blood transfusion    Past Surgical History  Procedure Laterality Date  . Excision vaginal cyst    . Tubal ligation  10/10/1991  . Myomectomy  73YRS AGO    HYSTEROSCOPIC  . Vaginal hysterectomy N/A 12/10/2012    Procedure: Total Vaginal Hysterectomy;  Surgeon: Eldred Manges, MD;  Location: [facility];  Service: Gynecology;  Laterality: N/A;  . Cystocele repair N/A 12/10/2012    Procedure: ANTERIOR REPAIR (CYSTOCELE);  Surgeon: Eldred Manges, MD;  Location: [facility];  Service: Gynecology;  Laterality: N/A;  . Bladder suspension N/A 12/10/2012    Procedure: TRANSVAGINAL TAPE (TVT) PROCEDURE;  Surgeon: Delice Lesch, MD;  Location: [facility];  Service: Gynecology;  Laterality: N/A;  . Cystoscopy N/A 12/10/2012    Procedure: CYSTOSCOPY;  Surgeon: Delice Lesch, MD;  Location: [facility];  Service:  Gynecology;  Laterality: N/A;   Social History   Social History  . Marital Status: Married    Spouse Name: N/A  . Number of Children: 2  . Years of Education: 15   Social History Main Topics  . Smoking status: Never Smoker   . Smokeless tobacco: Never Used  . Alcohol Use: Yes     Comment: rare  . Drug Use: No  . Sexual Activity: Yes    Birth Control/ Protection: Surgical     Comment: BTL    Other Topics Concern  . None   Social History Narrative   Regular exercise-no   Caffeine Use-yes   Family History  Problem Relation Age of Onset  . Heart failure Father   . Diabetes Mother   . High blood pressure Mother   . Arthritis Mother   . Hypertension Mother   . Breast cancer Maternal Aunt     in her 25's   . Breast cancer Cousin   . Thyroid disease Sister     Review of Systems  Complete ROS obtained and pertinent positive and negatives documented above in HPI. All other ROS negative.  Allergies  Other; Methimazole; Nitroglycerin; Penicillins; Terbutaline; and Tetracycline hcl  Home Medications   Prior to Admission medications   Medication Sig Start Date End Date Taking? Authorizing Provider  aspirin  EC 81 MG tablet Take 1 tablet (81 mg total) by mouth daily. 01/17/15   Hendricks Limes, MD  atorvastatin (LIPITOR) 10 MG tablet Take 1 tablet (10 mg total) by mouth daily. 01/17/15   Hendricks Limes, MD  fluticasone (FLONASE) 50 MCG/ACT nasal spray Place 2 sprays into both nostrils daily. 02/06/15   Lysbeth Penner, FNP  furosemide (LASIX) 20 MG tablet Take 1 tablet (20 mg total) by mouth daily. 02/28/15   Hendricks Limes, MD  losartan-hydrochlorothiazide (HYZAAR) 50-12.5 MG tablet Take 1 tablet by mouth daily. 03/06/15   Hendricks Limes, MD  ondansetron (ZOFRAN-ODT) 8 MG disintegrating tablet Take 1 tablet (8 mg total) by mouth once. 01/16/15   Waldemar Dickens, MD    Physical Exam  BP 130/75 mmHg  Pulse 75  Temp(Src) 98.4 F (36.9 C) (Oral)  Resp 19  Ht 5\' 4"   (1.626 m)  Wt 194 lb (87.998 kg)  BMI 33.28 kg/m2  SpO2 95%  LMP 12/03/2012 Physical Exam  Constitutional: She is oriented to person, place, and time. She appears well-developed and well-nourished.  Non-toxic appearance. She does not appear ill. No distress.  HENT:  Head: Normocephalic and atraumatic.  Right Ear: External ear normal.  Left Ear: External ear normal.  Eyes: Pupils are equal, round, and reactive to light. No scleral icterus.  Neck: Normal range of motion. Neck supple. No tracheal deviation present.  Cardiovascular: Normal heart sounds and intact distal pulses.   No murmur heard. Pulmonary/Chest: Effort normal and breath sounds normal. No stridor. No respiratory distress. She has no wheezes. She has no rales.  Abdominal: Soft. Bowel sounds are normal. She exhibits no distension. There is no tenderness. There is no rebound and no guarding.  Musculoskeletal: Normal range of motion.  Neurological: She is alert and oriented to person, place, and time. She has normal strength and normal reflexes. No cranial nerve deficit or sensory deficit.  Skin: Skin is warm and dry. No pallor.  Psychiatric: She has a normal mood and affect. Her behavior is normal.  Nursing note and vitals reviewed.   ED Course  Procedures Labs Review Labs Reviewed  BASIC METABOLIC PANEL - Abnormal; Notable for the following:    Sodium 134 (*)    Chloride 99 (*)    Glucose, Bld 101 (*)    All other components within normal limits  CBG MONITORING, ED - Abnormal; Notable for the following:    Glucose-Capillary 103 (*)    All other components within normal limits  CBC  URINALYSIS, ROUTINE W REFLEX MICROSCOPIC (NOT AT [facility])  URINE RAPID DRUG SCREEN, HOSP PERFORMED  ETHANOL   Imaging Review No results found.  Laboratory and Imaging results were personally reviewed by myself and used in the medical decision making of this patient's treatment and disposition.  EKG Interpretation  EKG  Interpretation  Date/Time:  Sunday March 05 2015 21:46:50 EDT Ventricular Rate:  80 PR Interval:  178 QRS Duration: 77 QT Interval:  402 QTC Calculation: 464 R Axis:   46 Text Interpretation:  Sinus rhythm ED PHYSICIAN INTERPRETATION AVAILABLE IN [facility] Confirmed by TEST, Record ([id number]) on 03/06/2015 7:15:54 AM      MDM  [name] is a 54 y.o. female with H&P as above. ED clinical course as follows:  Patient presents with symptoms concerning for presyncope.  Endorses no chest pain, no palpitations, and no SOB.  PMHx remarkable for no CAD, no prior Arrhythmia and no prior MI and no known prior  blood clots/clotting disorders. However patient high risk given hx of murmur, seizure disorder and recurrent anemia requiring transfusions.   DDx I considered includes neurocardiogenic (vasovagal), arrhythmia, structural heart disease, MI, CHF, orthostatic hypotension (from intravascular depletion), electrolyte abnormalities, symptomatic anemia, seizure, carotid sinus syndrome, and subclavian steal syndrome and sepsis.  VS stable, without evidence of cardiopulmonary instability. Exam without evidence of impaired perfusion or worsening volume overload.  Ascultation of Heart and Lung sounds without concern.   No AMS or evidence of active infection per H&P, do not suspect sepsis.  Patient does not have clinical signs or symptoms of DVT. I do not believe PE is the most likely diagnosis. The patient's heart rate is below 100, the patient has not been immobilized, and the patient has not had surgery in the previous 4 weeks. The patient is not displaying hemoptysis and has not suffered from a malignancy within the last 6 months.  Event not associated with turning head, or use of upper extremities or compressing neck area before episode.  Initial ECG reveals NSR with no evidence of STEMI, or new onset arrhythmia.  Troponin sent, which were negative.  Labs reveal no abnormalities that  would be the likely culprit of today's symptoms. Hgb/Hct stable. BG and electrolytes within normal limits.  UPT not required in light of total hysterectomy.  My visualization of Imaging revealed CXR without evidence of acute cardiopulmonary disease. Specifically no PTX or evidence of PNA.   Patient required no interventions at this time.  Disposition: Admit  I obtained consultation with the Hospitalist service for concerns of high risk syncope. I discussed the patients clinical course including their H&P, as well as, their diagnostic studies. Based upon that discussion, we've decided that the patient will require admission for continued observation at this time.  Disposition: Admit  Clinical Impression:  1. Syncope, unspecified syncope type    Patient care discussed with Dr. Vanita Panda, who oversaw their evaluation & treatment & voiced agreement. House Officer: Voncille Lo, MD, Emergency Medicine.  Voncille Lo, MD 03/08/15 [id number]  Carmin Muskrat, MD 03/08/15 [phone]

## 2015-03-06 ENCOUNTER — Ambulatory Visit (INDEPENDENT_AMBULATORY_CARE_PROVIDER_SITE_OTHER): Payer: 59 | Admitting: Internal Medicine

## 2015-03-06 ENCOUNTER — Encounter: Payer: Self-pay | Admitting: Internal Medicine

## 2015-03-06 VITALS — BP 150/100 | HR 76 | Temp 97.9°F | Ht 64.0 in | Wt 195.0 lb

## 2015-03-06 DIAGNOSIS — Z8669 Personal history of other diseases of the nervous system and sense organs: Secondary | ICD-10-CM

## 2015-03-06 DIAGNOSIS — M542 Cervicalgia: Secondary | ICD-10-CM

## 2015-03-06 DIAGNOSIS — R55 Syncope and collapse: Secondary | ICD-10-CM | POA: Diagnosis not present

## 2015-03-06 DIAGNOSIS — I10 Essential (primary) hypertension: Secondary | ICD-10-CM | POA: Diagnosis not present

## 2015-03-06 LAB — URINALYSIS, ROUTINE W REFLEX MICROSCOPIC
Bilirubin Urine: NEGATIVE
Glucose, UA: NEGATIVE mg/dL
Hgb urine dipstick: NEGATIVE
Ketones, ur: NEGATIVE mg/dL
Leukocytes, UA: NEGATIVE
Nitrite: NEGATIVE
Protein, ur: NEGATIVE mg/dL
Specific Gravity, Urine: 1.022 (ref 1.005–1.030)
Urobilinogen, UA: 1 mg/dL (ref 0.0–1.0)
pH: 5.5 (ref 5.0–8.0)

## 2015-03-06 LAB — CBC
HEMATOCRIT: 43.2 % (ref 36.0–46.0)
HEMOGLOBIN: 14.3 g/dL (ref 12.0–15.0)
MCH: 29.1 pg (ref 26.0–34.0)
MCHC: 33.1 g/dL (ref 30.0–36.0)
MCV: 88 fL (ref 78.0–100.0)
Platelets: 225 10*3/uL (ref 150–400)
RBC: 4.91 MIL/uL (ref 3.87–5.11)
RDW: 12.7 % (ref 11.5–15.5)
WBC: 8.3 10*3/uL (ref 4.0–10.5)

## 2015-03-06 LAB — RAPID URINE DRUG SCREEN, HOSP PERFORMED
Amphetamines: NOT DETECTED
Barbiturates: NOT DETECTED
Benzodiazepines: NOT DETECTED
Cocaine: NOT DETECTED
OPIATES: NOT DETECTED
TETRAHYDROCANNABINOL: NOT DETECTED

## 2015-03-06 LAB — ETHANOL

## 2015-03-06 MED ORDER — LOSARTAN POTASSIUM-HCTZ 50-12.5 MG PO TABS: 1.0000 | ORAL_TABLET | Freq: Every day | ORAL | Status: DC

## 2015-03-06 NOTE — Discharge Instructions (Signed)
Syncope °Syncope is a medical term for fainting or passing out. This means you lose consciousness and drop to the ground. People are generally unconscious for less than 5 minutes. You may have some muscle twitches for up to 15 seconds before waking up and returning to normal. Syncope occurs more often in older adults, but it can happen to anyone. While most causes of syncope are not dangerous, syncope can be a sign of a serious medical problem. It is important to seek medical care.  °CAUSES  °Syncope is caused by a sudden drop in blood flow to the brain. The specific cause is often not determined. Factors that can bring on syncope include: °· Taking medicines that lower blood pressure. °· Sudden changes in posture, such as standing up quickly. °· Taking more medicine than prescribed. °· Standing in one place for too long. °· Seizure disorders. °· Dehydration and excessive exposure to heat. °· Low blood sugar (hypoglycemia). °· Straining to have a bowel movement. °· Heart disease, irregular heartbeat, or other circulatory problems. °· Fear, emotional distress, seeing blood, or severe pain. °SYMPTOMS  °Right before fainting, you may: °· Feel dizzy or light-headed. °· Feel nauseous. °· See all white or all black in your field of vision. °· Have cold, clammy skin. °DIAGNOSIS  °Your health care provider will ask about your symptoms, perform a physical exam, and perform an electrocardiogram (ECG) to record the electrical activity of your heart. Your health care provider may also perform other heart or blood tests to determine the cause of your syncope which may include: °· Transthoracic echocardiogram (TTE). During echocardiography, sound waves are used to evaluate how blood flows through your heart. °· Transesophageal echocardiogram (TEE). °· Cardiac monitoring. This allows your health care provider to monitor your heart rate and rhythm in real time. °· Holter monitor. This is a portable device that records your  heartbeat and can help diagnose heart arrhythmias. It allows your health care provider to track your heart activity for several days, if needed. °· Stress tests by exercise or by giving medicine that makes the heart beat faster. °TREATMENT  °In most cases, no treatment is needed. Depending on the cause of your syncope, your health care provider may recommend changing or stopping some of your medicines. °HOME CARE INSTRUCTIONS °· Have someone stay with you until you feel stable. °· Do not drive, use machinery, or play sports until your health care provider says it is okay. °· Keep all follow-up appointments as directed by your health care provider. °· Lie down right away if you start feeling like you might faint. Breathe deeply and steadily. Wait until all the symptoms have passed. °· Drink enough fluids to keep your urine clear or pale yellow. °· If you are taking blood pressure or heart medicine, get up slowly and take several minutes to sit and then stand. This can reduce dizziness. °SEEK IMMEDIATE MEDICAL CARE IF:  °· You have a severe headache. °· You have unusual pain in the chest, abdomen, or back. °· You are bleeding from your mouth or rectum, or you have black or tarry stool. °· You have an irregular or very fast heartbeat. °· You have pain with breathing. °· You have repeated fainting or seizure-like jerking during an episode. °· You faint when sitting or lying down. °· You have confusion. °· You have trouble walking. °· You have severe weakness. °· You have vision problems. °If you fainted, call your local emergency services (911 in [place] Do not drive   yourself to the hospital.    This information is not intended to replace advice given to you by your health care provider. Make sure you discuss any questions you have with your health care provider.   Document Released: 04/22/2005 Document Revised: 09/06/2014 Document Reviewed: 06/21/2011 Elsevier Interactive Patient Education 2016 [employer].  Near-Syncope Near-syncope (commonly known as near fainting) is sudden weakness, dizziness, or feeling like you might pass out. During an episode of near-syncope, you may also develop pale skin, have tunnel vision, or feel sick to your stomach (nauseous). Near-syncope may occur when getting up after sitting or while standing for a long time. It is caused by a sudden decrease in blood flow to the brain. This decrease can result from various causes or triggers, most of which are not serious. However, because near-syncope can sometimes be a sign of something serious, a medical evaluation is required. The specific cause is often not determined. HOME CARE INSTRUCTIONS  Monitor your condition for any changes. The following actions may help to alleviate any discomfort you are experiencing:  Have someone stay with you until you feel stable.  Lie down right away and prop your feet up if you start feeling like you might faint. Breathe deeply and steadily. Wait until all the symptoms have passed. Most of these episodes last only a few minutes. You may feel tired for several hours.   Drink enough fluids to keep your urine clear or pale yellow.   If you are taking blood pressure or heart medicine, get up slowly when seated or lying down. Take several minutes to sit and then stand. This can reduce dizziness.  Follow up with your health care provider as directed. SEEK IMMEDIATE MEDICAL CARE IF:   You have a severe headache.   You have unusual pain in the chest, abdomen, or back.   You are bleeding from the mouth or rectum, or you have black or tarry stool.   You have an irregular or very fast heartbeat.   You have repeated fainting or have seizure-like jerking during an episode.   You faint when sitting or lying down.   You have confusion.   You have difficulty walking.   You have severe weakness.   You have vision problems.  MAKE SURE YOU:   Understand these  instructions.  Will watch your condition.  Will get help right away if you are not doing well or get worse.   This information is not intended to replace advice given to you by your health care provider. Make sure you discuss any questions you have with your health care provider.   Document Released: 04/22/2005 Document Revised: 04/27/2013 Document Reviewed: 09/25/2012 Elsevier Interactive Patient Education [employer].

## 2015-03-06 NOTE — Progress Notes (Signed)
Pre visit review using our clinic review tool, if applicable. No additional management support is needed unless otherwise documented below in the visit note. 

## 2015-03-06 NOTE — Progress Notes (Signed)
   Subjective:    Patient ID: April Gonzalez, female    DOB: [birth year], 54 y.o.   MRN: [id number]  HPI She was seen in the emergency room 03/05/15 for near-syncope. The noted by Dr. Freda Munro appears incomplete. She had sat in a high chair last night after supper. She was noted to start leaning to the side. Family and friends were was concerned that she was having a seizure as her eyes rolled back in her head.  She described no definite cardiac or neurologic prodrome prior to the event; but she did have a posterior occipital headache. She also noted some tingling and numbness in her feet.. She did state that she felt as if she were "in slow motion". She had no definite seizure activity with the event.  Apparently her blood pressure was 190/140 in the ambulance. She states it has been elevated at home. Urinalysis and urine drug screen were normal. Blood alcohol was less than 5. Sodium was minimally reduced at 134; random glucose was 101. EKG was normal. Noncontrast CT scan was also normal.  She has a history of seizure disorder; apparently she had seizures from the time she was a toddler until she was 7-8.  Dr Loanne Drilling , Endo, saw her 01/20/15. He diagnosed a small goiter. He felt neck pain was not thyroid related.   Review of Systems  Denied were any change in heart rhythm or rate prior to the event. There was no associated chest pain or shortness of breath .  Also specifically denied prior to the episode were headache, limb weakness, tingling, or numbness. No seizure activity noted.     Objective:   Physical Exam  Pertinent or positive findings include: with accommodation the left eye deviates laterally. There is unsustained nystagmus with lateral gaze. Cranial nerve exam is otherwise unremarkable. She has arteriolar narrowing on fundal exam. The thyroid is full and exquisitely tender to palpation. Abdomen is protuberant. She has mild crepitus in the knees.   General appearance :adequately  nourished; in no distress.  Eyes: No conjunctival inflammation or scleral icterus is present.  Oral exam:  Lips and gums are healthy appearing.There is no oropharyngeal erythema or exudate noted. Dental hygiene is good.  Heart:  Normal rate and regular rhythm. S1 and S2 normal without gallop, murmur, click, rub or other extra sounds    Lungs:Chest clear to auscultation; no wheezes, rhonchi,rales ,or rubs present.No increased work of breathing.   Abdomen: bowel sounds normal, soft and non-tender without masses, organomegaly or hernias noted.  No guarding or rebound.   Vascular :all pulses equal ; no bruits present.  Skin:Warm & dry.  Intact without suspicious lesions or rashes ; no tenting.   Lymphatic: No lymphadenopathy is noted about the head, neck, axilla.   Neuro: Strength, tone & DTRs normal.     Assessment & Plan:   #1 near syncope; rule out seizure disorder  #2 hypertension  #3 neck tenderness; Endocrinology evaluation revealed small goiter.  Plan: See orders and recommendations

## 2015-03-06 NOTE — Patient Instructions (Signed)
Minimal Blood Pressure Goal= AVERAGE < 140/90;  Ideal is an AVERAGE < 135/85. This AVERAGE should be calculated from @ least 5-7 BP readings taken @ different times of day on different days of week. You should not respond to isolated BP readings , but rather the AVERAGE for that week .Please bring your  blood pressure cuff to office visits to verify that it is reliable.It  can also be checked against the blood pressure device at the pharmacy. Finger or wrist cuffs are not dependable; an arm cuff is.  The Neurology referral will be scheduled and you'll be notified of the time.Please call the Referral Co-Ordinator @ [phone] if you have not been notified of appointment time within 7-10 days.

## 2015-03-15 ENCOUNTER — Emergency Department (HOSPITAL_COMMUNITY): Payer: 59

## 2015-03-15 ENCOUNTER — Encounter (HOSPITAL_COMMUNITY): Payer: Self-pay | Admitting: *Deleted

## 2015-03-15 ENCOUNTER — Other Ambulatory Visit (INDEPENDENT_AMBULATORY_CARE_PROVIDER_SITE_OTHER): Payer: 59

## 2015-03-15 ENCOUNTER — Emergency Department (HOSPITAL_COMMUNITY)
Admission: EM | Admit: 2015-03-15 | Discharge: 2015-03-15 | Disposition: A | Discharge status: Home / Self Care | Payer: 59 | Attending: Emergency Medicine | Admitting: Emergency Medicine

## 2015-03-15 DIAGNOSIS — Z7951 Long term (current) use of inhaled steroids: Secondary | ICD-10-CM | POA: Diagnosis not present

## 2015-03-15 DIAGNOSIS — S82831A Other fracture of upper and lower end of right fibula, initial encounter for closed fracture: Secondary | ICD-10-CM | POA: Insufficient documentation

## 2015-03-15 DIAGNOSIS — Z8639 Personal history of other endocrine, nutritional and metabolic disease: Secondary | ICD-10-CM | POA: Insufficient documentation

## 2015-03-15 DIAGNOSIS — Z79899 Other long term (current) drug therapy: Secondary | ICD-10-CM | POA: Insufficient documentation

## 2015-03-15 DIAGNOSIS — Y998 Other external cause status: Secondary | ICD-10-CM | POA: Insufficient documentation

## 2015-03-15 DIAGNOSIS — Y9289 Other specified places as the place of occurrence of the external cause: Secondary | ICD-10-CM | POA: Diagnosis not present

## 2015-03-15 DIAGNOSIS — R011 Cardiac murmur, unspecified: Secondary | ICD-10-CM | POA: Insufficient documentation

## 2015-03-15 DIAGNOSIS — Z862 Personal history of diseases of the blood and blood-forming organs and certain disorders involving the immune mechanism: Secondary | ICD-10-CM | POA: Insufficient documentation

## 2015-03-15 DIAGNOSIS — W108XXA Fall (on) (from) other stairs and steps, initial encounter: Secondary | ICD-10-CM | POA: Diagnosis not present

## 2015-03-15 DIAGNOSIS — Z880 Allergy status to penicillin: Secondary | ICD-10-CM | POA: Diagnosis not present

## 2015-03-15 DIAGNOSIS — M069 Rheumatoid arthritis, unspecified: Secondary | ICD-10-CM | POA: Insufficient documentation

## 2015-03-15 DIAGNOSIS — Z86018 Personal history of other benign neoplasm: Secondary | ICD-10-CM | POA: Insufficient documentation

## 2015-03-15 DIAGNOSIS — Z8742 Personal history of other diseases of the female genital tract: Secondary | ICD-10-CM | POA: Insufficient documentation

## 2015-03-15 DIAGNOSIS — S8992XA Unspecified injury of left lower leg, initial encounter: Secondary | ICD-10-CM | POA: Diagnosis present

## 2015-03-15 DIAGNOSIS — Y9389 Activity, other specified: Secondary | ICD-10-CM | POA: Insufficient documentation

## 2015-03-15 DIAGNOSIS — Z7982 Long term (current) use of aspirin: Secondary | ICD-10-CM | POA: Diagnosis not present

## 2015-03-15 DIAGNOSIS — S82891A Other fracture of right lower leg, initial encounter for closed fracture: Secondary | ICD-10-CM

## 2015-03-15 DIAGNOSIS — E785 Hyperlipidemia, unspecified: Secondary | ICD-10-CM | POA: Diagnosis not present

## 2015-03-15 LAB — HEPATIC FUNCTION PANEL
ALT: 20 U/L (ref 0–35)
AST: 15 U/L (ref 0–37)
Albumin: 4.3 g/dL (ref 3.5–5.2)
Alkaline Phosphatase: 114 U/L (ref 39–117)
BILIRUBIN DIRECT: 0.1 mg/dL (ref 0.0–0.3)
BILIRUBIN TOTAL: 0.5 mg/dL (ref 0.2–1.2)
TOTAL PROTEIN: 7.6 g/dL (ref 6.0–8.3)

## 2015-03-15 LAB — LIPID PANEL
Cholesterol: 223 mg/dL — ABNORMAL HIGH (ref 0–200)
HDL: 37.4 mg/dL — ABNORMAL LOW (ref >39.00)
NONHDL: 185.63
TRIGLYCERIDES: 242 mg/dL — AB (ref 0.0–149.0)
Total CHOL/HDL Ratio: 6
VLDL: 48.4 mg/dL — ABNORMAL HIGH (ref 0.0–40.0)

## 2015-03-15 LAB — I-STAT CHEM 8, ED
BUN: 18 mg/dL (ref 6–20)
CALCIUM ION: 1.16 mmol/L (ref 1.12–1.23)
Chloride: 102 mmol/L (ref 101–111)
Creatinine, Ser: 0.9 mg/dL (ref 0.44–1.00)
GLUCOSE: 114 mg/dL — AB (ref 65–99)
HCT: 43 % (ref 36.0–46.0)
HEMOGLOBIN: 14.6 g/dL (ref 12.0–15.0)
POTASSIUM: 4 mmol/L (ref 3.5–5.1)
SODIUM: 139 mmol/L (ref 135–145)
TCO2: 25 mmol/L (ref 0–100)

## 2015-03-15 LAB — CK: Total CK: 84 U/L (ref 7–177)

## 2015-03-15 LAB — LDL CHOLESTEROL, DIRECT: Direct LDL: 139 mg/dL

## 2015-03-15 MED ORDER — OXYCODONE-ACETAMINOPHEN 5-325 MG PO TABS: 1.0000 | ORAL_TABLET | ORAL | Status: DC | PRN

## 2015-03-15 MED ORDER — OXYCODONE-ACETAMINOPHEN 5-325 MG PO TABS
1.0000 | ORAL_TABLET | Freq: Once | ORAL | Status: AC
  Administered 2015-03-15: 1 via ORAL
  Filled 2015-03-15: qty 1

## 2015-03-15 MED ORDER — ONDANSETRON HCL 4 MG/2ML IJ SOLN
4.0000 mg | Freq: Once | INTRAMUSCULAR | Status: AC
  Administered 2015-03-15: 4 mg via INTRAVENOUS
  Filled 2015-03-15: qty 2

## 2015-03-15 NOTE — Progress Notes (Signed)
Orthopedic Tech Progress Note Patient Details:  April Gonzalez 08/20/1960 [id number]  Ortho Devices Type of Ortho Device: Ace wrap, Crutches, Post (short leg) splint Ortho Device/Splint Location: RLE Ortho Device/Splint Interventions: Ordered, Application   April Gonzalez 03/15/2015, 8:37 PM

## 2015-03-15 NOTE — ED Notes (Signed)
Pt fell down steps at [facility].  Pain in right lower extremity and left knee.  Pt denies any LOC and no dizziness before falling. Pt arrives with leg splint and has palpable pulses in BLE.

## 2015-03-15 NOTE — ED Notes (Signed)
Patient transported to X-ray 

## 2015-03-15 NOTE — Discharge Instructions (Signed)
°Cast or Splint Care  ° ° °Casts and splints support injured limbs and keep bones from moving while they heal. It is important to care for your cast or splint at home.  °HOME CARE INSTRUCTIONS  °Keep the cast or splint uncovered during the drying period. It can take 24 to 48 hours to dry if it is made of plaster. A fiberglass cast will dry in less than 1 hour.  °Do not rest the cast on anything harder than a pillow for the first 24 hours.  °Do not put weight on your injured limb or apply pressure to the cast until your health care provider gives you permission.  °Keep the cast or splint dry. Wet casts or splints can lose their shape and may not support the limb as well. A wet cast that has lost its shape can also create harmful pressure on your skin when it dries. Also, wet skin can become infected.  °Cover the cast or splint with a plastic bag when bathing or when out in the rain or snow. If the cast is on the trunk of the body, take sponge baths until the cast is removed.  °If your cast does become wet, dry it with a towel or a blow dryer on the cool setting only. °Keep your cast or splint clean. Soiled casts may be wiped with a moistened cloth.  °Do not place any hard or soft foreign objects under your cast or splint, such as cotton, toilet paper, lotion, or powder.  °Do not try to scratch the skin under the cast with any object. The object could get stuck inside the cast. Also, scratching could lead to an infection. If itching is a problem, use a blow dryer on a cool setting to relieve discomfort.  °Do not trim or cut your cast or remove padding from inside of it.  °Exercise all joints next to the injury that are not immobilized by the cast or splint. For example, if you have a long leg cast, exercise the hip joint and toes. If you have an arm cast or splint, exercise the shoulder, elbow, thumb, and fingers.  °Elevate your injured arm or leg on 1 or 2 pillows for the first 1 to 3 days to decrease swelling and  pain. It is best if you can comfortably elevate your cast so it is higher than your heart. °SEEK MEDICAL CARE IF:  °Your cast or splint cracks.  °Your cast or splint is too tight or too loose.  °You have unbearable itching inside the cast.  °Your cast becomes wet or develops a soft spot or area.  °You have a bad smell coming from inside your cast.  °You get an object stuck under your cast.  °Your skin around the cast becomes red or raw.  °You have new pain or worsening pain after the cast has been applied. °SEEK IMMEDIATE MEDICAL CARE IF:  °You have fluid leaking through the cast.  °You are unable to move your fingers or toes.  °You have discolored (blue or white), cool, painful, or very swollen fingers or toes beyond the cast.  °You have tingling or numbness around the injured area.  °You have severe pain or pressure under the cast.  °You have any difficulty with your breathing or have shortness of breath.  °You have chest pain. °This information is not intended to replace advice given to you by your health care provider. Make sure you discuss any questions you have with your health care provider.  °  Document Released: 04/19/2000 Document Revised: 02/10/2013 Document Reviewed: 10/29/2012  °Elsevier Interactive Patient Education ©2016 Elsevier Inc.  ° °

## 2015-03-15 NOTE — ED Provider Notes (Addendum)
CSN: [id number]     Arrival date & time 03/15/15  1803 History   First MD Initiated Contact with Patient 03/15/15 1815     Chief Complaint  Patient presents with  . Fall      HPI Patient fell wall walking down steps today at [facility] and complains of pain in her right ankle.  She reports her pain is severe in severity at this time.  She denies head injury or loss consciousness.  She presents with EMS.  She can wiggle her toes.  No other complaints.   Past Medical History  Diagnosis Date  . Seizures ([place])     last seizure age 75 or 10 per pt report  . RA (rheumatoid arthritis) ([facility])     states was dx at age 59 but not on meds  . H/O uterine prolapse   . Thyroid disorder   . Fibroids     H/O myomectomy  . Anemia 05/2012    transfusion  . Heart murmur   . History of blood transfusion    Past Surgical History  Procedure Laterality Date  . Excision vaginal cyst    . Tubal ligation  10/10/1991  . Myomectomy  43YRS AGO    HYSTEROSCOPIC  . Vaginal hysterectomy N/A 12/10/2012    Procedure: Total Vaginal Hysterectomy;  Surgeon: Eldred Manges, MD;  Location: [facility];  Service: Gynecology;  Laterality: N/A;  . Cystocele repair N/A 12/10/2012    Procedure: ANTERIOR REPAIR (CYSTOCELE);  Surgeon: Eldred Manges, MD;  Location: [facility];  Service: Gynecology;  Laterality: N/A;  . Bladder suspension N/A 12/10/2012    Procedure: TRANSVAGINAL TAPE (TVT) PROCEDURE;  Surgeon: Delice Lesch, MD;  Location: [facility];  Service: Gynecology;  Laterality: N/A;  . Cystoscopy N/A 12/10/2012    Procedure: CYSTOSCOPY;  Surgeon: Delice Lesch, MD;  Location: [facility];  Service: Gynecology;  Laterality: N/A;   Family History  Problem Relation Age of Onset  . Heart failure Father   . Diabetes Mother   . High blood pressure Mother   . Arthritis Mother   . Hypertension Mother   . Breast cancer Maternal Aunt     in her 65's   . Breast cancer Cousin   . Thyroid disease Sister    Social History  Substance Use  Topics  . Smoking status: Never Smoker   . Smokeless tobacco: Never Used  . Alcohol Use: Yes     Comment: rare   OB History    Gravida Para Term Preterm AB TAB SAB Ectopic Multiple Living   2 2 2       2      Review of Systems  All other systems reviewed and are negative.     Allergies  Methimazole; Nitroglycerin; Other; Tetracycline hcl; Penicillins; and Terbutaline  Home Medications   Prior to Admission medications   Medication Sig Start Date End Date Taking? Authorizing Provider  aspirin EC 81 MG tablet Take 1 tablet (81 mg total) by mouth daily. 01/17/15  Yes Hendricks Limes, MD  atorvastatin (LIPITOR) 10 MG tablet Take 1 tablet (10 mg total) by mouth daily. 01/17/15  Yes Hendricks Limes, MD  fluticasone (FLONASE) 50 MCG/ACT nasal spray Place 2 sprays into both nostrils daily. Patient taking differently: Place 2 sprays into both nostrils daily as needed for allergies.  02/06/15  Yes Lysbeth Penner, FNP  losartan-hydrochlorothiazide (HYZAAR) 50-12.5 MG tablet Take 1 tablet by mouth daily. 03/06/15  Yes Hendricks Limes, MD  furosemide (LASIX) 20 MG tablet Take 1 tablet (20 mg total) by mouth daily. 02/28/15   Hendricks Limes, MD  ondansetron (ZOFRAN-ODT) 8 MG disintegrating tablet Take 1 tablet (8 mg total) by mouth once. 01/16/15   Waldemar Dickens, MD  oxyCODONE-acetaminophen (PERCOCET/ROXICET) 5-325 MG tablet Take 1 tablet by mouth every 4 (four) hours as needed for severe pain. 03/15/15   Jola Schmidt, MD   BP 140/92 mmHg  Pulse 84  Temp(Src) 98.1 F (36.7 C) (Oral)  Resp 16  Ht 5\' 1"  (1.549 m)  Wt 189 lb (85.73 kg)  BMI 35.73 kg/m2  SpO2 97%  LMP 12/03/2012 Physical Exam  Constitutional: She is oriented to person, place, and time. She appears well-developed and well-nourished. No distress.  HENT:  Head: Normocephalic and atraumatic.  Eyes: EOM are normal.  Neck: Normal range of motion.  C-spine nontender.  Cardiovascular: Normal rate, regular rhythm and normal  heart sounds.   Pulmonary/Chest: Effort normal and breath sounds normal.  Abdominal: Soft. She exhibits no distension. There is no tenderness.  Musculoskeletal:  Limited range of motion of right ankle secondary to pain.  Mild swelling of the lateral aspect of the right tibia.  Normal pulses in the right foot.  Full range of motion of right knee and right hip.  Full range of motion of left ankle, left knee, left hip.  Small abrasion to the anterior left knee overlying the patella without obvious deformity.  Neurological: She is alert and oriented to person, place, and time.  Skin: Skin is warm and dry.  Psychiatric: She has a normal mood and affect. Judgment normal.  Nursing note and vitals reviewed.   ED Course  Procedures (including critical care time)   SPLINT APPLICATION Authorized by: Hoy Morn Consent: Verbal consent obtained. Risks and benefits: risks, benefits and alternatives were discussed Consent given by: patient Splint applied by: orthopedic technician Location details: right lower leg Splint type: Posterior with stirrup  Supplies used: Plaster  Post-procedure: The splinted body part was neurovascularly unchanged following the procedure. Patient tolerance: Patient tolerated the procedure well with no immediate complications.     Labs Review Labs Reviewed  I-STAT CHEM 8, ED - Abnormal; Notable for the following:    Glucose, Bld 114 (*)    All other components within normal limits    Imaging Review Dg Ankle Complete Right  03/15/2015  CLINICAL DATA:  Patient fell down 15 - 20 concrete steps about 2 hours ago. Swelling and numbness right ankle - lateral malleolus. Numbness in toes also. Patient could not dorsiflex. EXAM: RIGHT ANKLE - COMPLETE 3+ VIEW COMPARISON:  None. FINDINGS: Mildly displaced oblique fracture of the distal fibula at the level of the ankle mortise. Questionable posterior tibial tubercle fracture. There is mild widening and medial clear space.  Soft tissue edema noted about the ankle. Small plantar calcaneal spur and a diminutive Achilles tendon enthesophyte. IMPRESSION: Findings concerning for trimalleolar injury with displaced fracture of the distal fibula, probable posterior tibial tubercle fracture, and widening of the medial clear space consistent with ligamentous injury. Electronically Signed   By: Jeb Levering M.D.   On: 03/15/2015 19:27   I have personally reviewed and evaluated these images and lab results as part of my medical decision-making.   EKG Interpretation   Date/Time:  Wednesday March 15 2015 19:51:55 EST Ventricular Rate:  76 PR Interval:  164 QRS Duration: 79 QT Interval:  398 QTC Calculation: 447 R Axis:   27 Text Interpretation:  Sinus  rhythm Consider left atrial enlargement No  significant change was found Confirmed by Harley Mccartney  MD, Lennette Bihari ([id number]) on  03/15/2015 9:17:29 PM      MDM   Final diagnoses:  Closed right ankle fracture, initial encounter    Mechanical fall.  No head injury.  Overall well-appearing.  C-spine cleared by Nexus criteria.  Possible right ankle try mouth fracture.  Splinted.  Orthopedic follow-up.  Nonweightbearing status    Jola Schmidt, MD 03/15/15 2117  Jola Schmidt, MD 03/29/15 [phone]

## 2015-03-16 ENCOUNTER — Telehealth: Payer: Self-pay | Admitting: Internal Medicine

## 2015-03-16 NOTE — Telephone Encounter (Signed)
Patient broke her foot last night and the pain medication they prescribed is not working well and she is wondering if you can call her in something else. She still has to study, so she's wanting something that she can still be functional with.

## 2015-03-16 NOTE — Telephone Encounter (Signed)
See if she wants Tramadol please

## 2015-03-16 NOTE — Telephone Encounter (Signed)
Percocet was prescribed; there is nothing stronger than this narcotic pain medication.  Tramadol 50 mg every 6-8 hours as needed can be used during the day; these cannot be taken together. #30 If pain persists or progresses; orthopedic consultation is necessary.

## 2015-03-17 NOTE — Telephone Encounter (Signed)
Spoke to patient. She stated that she would like to stick with percocet for now. She declined the tramadol

## 2015-03-17 NOTE — Telephone Encounter (Signed)
First attempt called patient, unable to reach patient and can not leave voicemail due to it not being set up.

## 2015-03-23 ENCOUNTER — Telehealth: Payer: Self-pay | Admitting: *Deleted

## 2015-03-23 NOTE — Telephone Encounter (Signed)
Left msg on triage yesterday afternoon stating she is going to need refill on her pain med. She only has 3 pills left and her surgery is not until 12/1...April Gonzalez

## 2015-03-23 NOTE — Telephone Encounter (Signed)
If surgery is scheduled for 04/06/15; the surgeon needs to Rx whatever he/ she feels is appropriate based on their preop exam findings.

## 2015-03-23 NOTE — Telephone Encounter (Signed)
Notified pt with md response.../lmb 

## 2015-03-24 ENCOUNTER — Encounter: Payer: Self-pay | Admitting: Internal Medicine

## 2015-04-07 ENCOUNTER — Encounter: Payer: 59 | Admitting: Gastroenterology

## 2015-04-17 ENCOUNTER — Other Ambulatory Visit (HOSPITAL_COMMUNITY): Payer: Self-pay | Admitting: Orthopedic Surgery

## 2015-04-17 ENCOUNTER — Ambulatory Visit (HOSPITAL_COMMUNITY)
Admission: RE | Admit: 2015-04-17 | Discharge: 2015-04-17 | Disposition: A | Discharge status: Home / Self Care | Payer: 59 | Source: Ambulatory Visit | Attending: Cardiology | Admitting: Cardiology

## 2015-04-17 DIAGNOSIS — R52 Pain, unspecified: Secondary | ICD-10-CM

## 2015-04-17 DIAGNOSIS — M79604 Pain in right leg: Secondary | ICD-10-CM | POA: Diagnosis not present

## 2015-04-17 DIAGNOSIS — M7989 Other specified soft tissue disorders: Secondary | ICD-10-CM | POA: Insufficient documentation

## 2015-04-20 ENCOUNTER — Ambulatory Visit (INDEPENDENT_AMBULATORY_CARE_PROVIDER_SITE_OTHER): Payer: 59 | Admitting: Neurology

## 2015-04-20 ENCOUNTER — Encounter: Payer: Self-pay | Admitting: Neurology

## 2015-04-20 VITALS — BP 130/90 | HR 130 | Ht 64.0 in

## 2015-04-20 DIAGNOSIS — R55 Syncope and collapse: Secondary | ICD-10-CM | POA: Diagnosis not present

## 2015-04-20 DIAGNOSIS — R442 Other hallucinations: Secondary | ICD-10-CM

## 2015-04-20 NOTE — Patient Instructions (Signed)
1. Schedule MRI brain with and without contrast 2. Schedule 1-hour sleep-deprived EEG 3. As per [place] driving laws, after an episode of loss of consciousness/awareness, one should not drive until 6 months event-free 4. Wishing you well with healing of your foot fracture! 5. Follow-up in 1 month
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
## 2015-04-24 ENCOUNTER — Ambulatory Visit (INDEPENDENT_AMBULATORY_CARE_PROVIDER_SITE_OTHER): Payer: 59 | Admitting: Neurology

## 2015-04-24 DIAGNOSIS — R55 Syncope and collapse: Secondary | ICD-10-CM

## 2015-04-24 DIAGNOSIS — R442 Other hallucinations: Secondary | ICD-10-CM

## 2015-04-26 ENCOUNTER — Telehealth: Payer: Self-pay | Admitting: Family Medicine

## 2015-04-26 NOTE — Procedures (Signed)
ELECTROENCEPHALOGRAM REPORT  Date of Study: 04/24/2015  Patient's Name: Devinee Ting MRN: [id number] Date of Birth: [birth year]  Referring Provider: Dr. Ellouise Newer  Clinical History: This is a 54 year old woman with a history of childhood seizures, with recent episode of decreased responsiveness and episodes of gustatory hallucinations.  Medications: Hyzaar, aspirin, Lipitor, Lasix  Technical Summary: A multichannel digital 1-hour sleep-deprived EEG recording measured by the international 10-20 system with electrodes applied with paste and impedances below 5000 ohms performed in our laboratory with EKG monitoring in an awake and asleep patient.  Hyperventilation and photic stimulation were performed.  The digital EEG was referentially recorded, reformatted, and digitally filtered in a variety of bipolar and referential montages for optimal display.    Description: The patient is awake and asleep during the recording.  During maximal wakefulness, there is a symmetric, medium voltage 10 Hz posterior dominant rhythm that attenuates with eye opening.  The record is symmetric.  During drowsiness and sleep, there is an increase in theta slowing of the background.  Vertex waves and symmetric sleep spindles were seen.  Hyperventilation and photic stimulation did not elicit any abnormalities.  There were no epileptiform discharges or electrographic seizures seen.    EKG lead was unremarkable.  Impression: This 1-hour awake and asleep EEG is normal.    Clinical Correlation: A normal EEG does not exclude a clinical diagnosis of epilepsy.  If further clinical questions remain, prolonged EEG may be helpful.  Clinical correlation is advised.   Ellouise Newer, M.D.

## 2015-04-26 NOTE — Telephone Encounter (Signed)
-----   Message from Cameron Sprang, MD sent at 04/26/2015 10:23 AM EST ----- Pls let her know EEG is normal, thanks

## 2015-04-26 NOTE — Telephone Encounter (Signed)
Patient was notified of results.  

## 2015-04-28 DIAGNOSIS — R55 Syncope and collapse: Secondary | ICD-10-CM | POA: Insufficient documentation

## 2015-04-28 DIAGNOSIS — R442 Other hallucinations: Secondary | ICD-10-CM | POA: Insufficient documentation

## 2015-04-29 ENCOUNTER — Ambulatory Visit
Admission: RE | Admit: 2015-04-29 | Discharge: 2015-04-29 | Disposition: A | Discharge status: Home / Self Care | Payer: 59 | Source: Ambulatory Visit | Attending: Neurology | Admitting: Neurology

## 2015-04-29 DIAGNOSIS — R442 Other hallucinations: Secondary | ICD-10-CM

## 2015-04-29 DIAGNOSIS — R55 Syncope and collapse: Secondary | ICD-10-CM

## 2015-04-29 MED ORDER — GADOBENATE DIMEGLUMINE 529 MG/ML IV SOLN
17.0000 mL | Freq: Once | INTRAVENOUS | Status: AC | PRN
  Administered 2015-04-29: 17 mL via INTRAVENOUS

## 2015-05-02 ENCOUNTER — Telehealth: Payer: Self-pay | Admitting: Neurology

## 2015-05-02 NOTE — Telephone Encounter (Signed)
PT had a question about her MRI results/Dawn CB# [phone]

## 2015-05-02 NOTE — Telephone Encounter (Signed)
Patient was notified of results. She had a question about the left frontal white matter that was noted as seen in the mri report impression. Per Dr. Delice Lesch, it's nothing that would be causing her to symptomatic of anything, and that is usually seen when people with bp, cholesterol, or diabetic issues. Also could attribute to age. Patient verbalized good understanding.

## 2015-05-02 NOTE — Telephone Encounter (Signed)
-----   Message from Cameron Sprang, MD sent at 05/02/2015 12:57 PM EST ----- Pls let her know I reviewed MRI brain and it is unremarkable, no evidence of tumor, stroke, or bleed. Thanks

## 2015-05-17 ENCOUNTER — Encounter: Payer: Self-pay | Admitting: Neurology

## 2015-05-17 ENCOUNTER — Ambulatory Visit (INDEPENDENT_AMBULATORY_CARE_PROVIDER_SITE_OTHER): Payer: BLUE CROSS/BLUE SHIELD | Admitting: Neurology

## 2015-05-17 VITALS — BP 126/84 | HR 127 | Resp 18 | Ht 64.0 in

## 2015-05-17 DIAGNOSIS — R55 Syncope and collapse: Secondary | ICD-10-CM
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
## 2015-05-17 NOTE — Patient Instructions (Signed)
1. Follow-up in 3 months, call for any changes 2. As per [place] driving laws, for any episode of loss of awareness, no driving until 6 months event-free

## 2015-06-28 ENCOUNTER — Encounter: Payer: Self-pay | Admitting: Family

## 2015-06-28 ENCOUNTER — Other Ambulatory Visit (INDEPENDENT_AMBULATORY_CARE_PROVIDER_SITE_OTHER): Payer: BLUE CROSS/BLUE SHIELD

## 2015-06-28 ENCOUNTER — Ambulatory Visit (INDEPENDENT_AMBULATORY_CARE_PROVIDER_SITE_OTHER): Payer: BLUE CROSS/BLUE SHIELD | Admitting: Family

## 2015-06-28 VITALS — BP 128/88 | HR 82 | Temp 97.6°F | Resp 16 | Ht 64.0 in | Wt 184.0 lb

## 2015-06-28 DIAGNOSIS — E039 Hypothyroidism, unspecified: Secondary | ICD-10-CM

## 2015-06-28 DIAGNOSIS — E611 Iron deficiency: Secondary | ICD-10-CM

## 2015-06-28 DIAGNOSIS — I10 Essential (primary) hypertension: Secondary | ICD-10-CM | POA: Insufficient documentation

## 2015-06-28 DIAGNOSIS — J3489 Other specified disorders of nose and nasal sinuses: Secondary | ICD-10-CM | POA: Diagnosis not present

## 2015-06-28 LAB — IBC PANEL
Iron: 63 ug/dL (ref 42–145)
SATURATION RATIOS: 15.6 % — AB (ref 20.0–50.0)
TRANSFERRIN: 288 mg/dL (ref 212.0–360.0)

## 2015-06-28 LAB — CBC
HCT: 40.8 % (ref 36.0–46.0)
HEMOGLOBIN: 13.8 g/dL (ref 12.0–15.0)
MCHC: 33.8 g/dL (ref 30.0–36.0)
MCV: 84.3 fl (ref 78.0–100.0)
PLATELETS: 254 10*3/uL (ref 150.0–400.0)
RBC: 4.84 Mil/uL (ref 3.87–5.11)
RDW: 13.1 % (ref 11.5–15.5)
WBC: 9.1 10*3/uL (ref 4.0–10.5)

## 2015-06-28 LAB — FERRITIN: Ferritin: 46.4 ng/mL (ref 10.0–291.0)

## 2015-06-28 LAB — T4: T4 TOTAL: 7.1 ug/dL (ref 4.5–12.0)

## 2015-06-28 MED ORDER — LOSARTAN POTASSIUM-HCTZ 50-12.5 MG PO TABS: 1.0000 | ORAL_TABLET | Freq: Every day | ORAL | Status: DC

## 2015-06-28 MED ORDER — AZITHROMYCIN 250 MG PO TABS: ORAL_TABLET | ORAL | Status: DC

## 2015-06-28 NOTE — Assessment & Plan Note (Signed)
Notes increase in symptoms potentially associated with hypothyroidism. Obtain TSH. Enlarged thyroid noted on exam. Obtain ultrasound. Continue current dosage of levothyroxine pending TSH results.

## 2015-06-28 NOTE — Patient Instructions (Signed)
Thank you for choosing [facility].  Summary/Instructions:  Your prescription(s) have been submitted to your pharmacy or been printed and provided for you. Please take as directed and contact our office if you believe you are having problem(s) with the medication(s) or have any questions.  Please stop by the lab on the basement level of the building for your blood work. Your results will be released to [place] (or called to you) after review, usually within 72 hours after test completion. If any changes need to be made, you will be notified at that same time.  If your symptoms worsen or fail to improve, please contact our office for further instruction, or in case of emergency go directly to the emergency room at the closest medical facility.   Please continue to take your medication as prescribed.

## 2015-06-28 NOTE — Assessment & Plan Note (Signed)
Most recent blood work with no evidence of anemia and current symptoms concerning for possible iron deficiency. Obtain IBC panel and ferritin. Follow-up pending blood work.

## 2015-06-28 NOTE — Progress Notes (Signed)
Subjective:    Patient ID: April Gonzalez, female    DOB: [birth year], 55 y.o.   MRN: [id number]  Chief Complaint  Patient presents with  . Medication Refill    needs refill of losartan    HPI:  April Gonzalez is a 55 y.o. female who  has a past medical history of Seizures ([place]); RA (rheumatoid arthritis) ([place]); H/O uterine prolapse; Thyroid disorder; Fibroids; Anemia (05/2012); Heart murmur; and History of blood transfusion. and presents today for a follow up office visit.    1.) Hypertension - Currently maintained on lisinopril-hydrochlorothiazide. Reports taking the medication as prescribed and denies adverse side effects or hypotensive events. Denies symptoms of end organ damage.   BP Readings from Last 3 Encounters:  06/28/15 128/88  05/17/15 126/84  04/20/15 130/90    2.) Hypothyroidism - Currently maintained on levothyroxine. Reports taking the medication as prescribed and denies adverse side effects. Has noted increased cold intolerance and loss of hair recently.   Lab Results  Component Value Date   TSH 2.38 01/06/2015    3.) Sinus pain / jaw pain - Recently evaluated in Urgent Care and treated with Azithromycin for sinus related symptoms. She completed the azithromycin and noted significant improvement in her symptoms and continues to experience some discomfort located behind her right ear radiating down to her jaw. No fevers. Denies any factors that make it worse.   Allergies  Allergen Reactions  . Methimazole Anaphylaxis  . Nitroglycerin Other (See Comments)    REACTION: sweating  . Other Other (See Comments)    Seasonal / dog hair   . Tetracycline Hcl Other (See Comments)    REACTION: elevated blood pressure  . Penicillins Other (See Comments)    unknown  . Terbutaline Other (See Comments)     Current Outpatient Prescriptions on File Prior to Visit  Medication Sig Dispense Refill  . aspirin EC 81 MG tablet Take 81 mg by mouth daily. Reported on 05/17/2015      . fluticasone (FLONASE) 50 MCG/ACT nasal spray Place 2 sprays into both nostrils daily. (Patient taking differently: Place 2 sprays into both nostrils daily as needed for allergies. ) 16 g 2  . levothyroxine (SYNTHROID, LEVOTHROID) 25 MCG tablet 25 mcg. Take 1 tablet daily  2   No current facility-administered medications on file prior to visit.     Past Surgical History  Procedure Laterality Date  . Excision vaginal cyst    . Tubal ligation  10/10/1991  . Myomectomy  71YRS AGO    HYSTEROSCOPIC  . Vaginal hysterectomy N/A 12/10/2012    Procedure: Total Vaginal Hysterectomy;  Surgeon: Eldred Manges, MD;  Location: [facility];  Service: Gynecology;  Laterality: N/A;  . Cystocele repair N/A 12/10/2012    Procedure: ANTERIOR REPAIR (CYSTOCELE);  Surgeon: Eldred Manges, MD;  Location: [facility];  Service: Gynecology;  Laterality: N/A;  . Bladder suspension N/A 12/10/2012    Procedure: TRANSVAGINAL TAPE (TVT) PROCEDURE;  Surgeon: Delice Lesch, MD;  Location: [facility];  Service: Gynecology;  Laterality: N/A;  . Cystoscopy N/A 12/10/2012    Procedure: CYSTOSCOPY;  Surgeon: Delice Lesch, MD;  Location: [facility];  Service: Gynecology;  Laterality: N/A;    Past Medical History  Diagnosis Date  . Seizures ([place])     last seizure age 4 or 10 per pt report  . RA (rheumatoid arthritis) ([facility])     states was dx at age 34 but not on meds  . H/O uterine prolapse   .  Thyroid disorder   . Fibroids     H/O myomectomy  . Anemia 05/2012    transfusion  . Heart murmur   . History of blood transfusion       Review of Systems  Constitutional: Negative for fever and chills.  HENT: Positive for congestion and ear pain.        Positive for jaw discomfort.  Eyes:       Negative for changes in vision.  Respiratory: Negative for chest tightness.   Cardiovascular: Negative for chest pain, palpitations and leg swelling.  Endocrine: Positive for cold intolerance.       Positive for increased hair loss.   Neurological: Negative for headaches.      Objective:    BP 128/88 mmHg  Pulse 82  Temp(Src) 97.6 F (36.4 C) (Oral)  Resp 16  Ht 5\' 4"  (1.626 m)  Wt 184 lb (83.462 kg)  BMI 31.57 kg/m2  SpO2 96%  LMP 12/03/2012 Nursing note and vital signs reviewed.  Physical Exam  Constitutional: She is oriented to person, place, and time. She appears well-developed and well-nourished. No distress.  HENT:  Right Ear: Hearing, tympanic membrane, external ear and ear canal normal.  Left Ear: Hearing, tympanic membrane, external ear and ear canal normal.  Nose: Nose normal. Right sinus exhibits no maxillary sinus tenderness and no frontal sinus tenderness. Left sinus exhibits no maxillary sinus tenderness and no frontal sinus tenderness.  Mouth/Throat: Uvula is midline, oropharynx is clear and moist and mucous membranes are normal.  Neck: Thyromegaly present.  Cardiovascular: Normal rate, regular rhythm, normal heart sounds and intact distal pulses.   Pulmonary/Chest: Effort normal and breath sounds normal.  Neurological: She is alert and oriented to person, place, and time.  Skin: Skin is warm and dry.  Psychiatric: She has a normal mood and affect. Her behavior is normal. Judgment and thought content normal.       Assessment & Plan:   Problem List Items Addressed This Visit      Cardiovascular and Mediastinum   Essential hypertension - Primary    Hypertension adequately controlled current regimen and below goal 140/90. No adverse side effects or symptoms of end organ damage. Continue current dosage of lisinopril-hydrochlorothiazide. Continue to monitor blood pressure at home.      Relevant Medications   losartan-hydrochlorothiazide (HYZAAR) 50-12.5 MG tablet     Endocrine   Hypothyroidism    Notes increase in symptoms potentially associated with hypothyroidism. Obtain TSH. Enlarged thyroid noted on exam. Obtain ultrasound. Continue current dosage of levothyroxine pending TSH  results.      Relevant Orders   TSH   T4   CBC   US Soft Tissue Head/Neck     Other   Iron deficiency    Most recent blood work with no evidence of anemia and current symptoms concerning for possible iron deficiency. Obtain IBC panel and ferritin. Follow-up pending blood work.      Relevant Orders   IBC panel   Ferritin   Sinus pressure    Symptoms and exam consistent with sinus pressure improved with previous course of azithromycin however continues to have symptoms. Refill azithromycin. Follow up if symptoms worsen or do not improve with medication.      Relevant Medications   azithromycin (ZITHROMAX) 250 MG tablet

## 2015-06-28 NOTE — Progress Notes (Signed)
Pre visit review using our clinic review tool, if applicable. No additional management support is needed unless otherwise documented below in the visit note. 

## 2015-06-28 NOTE — Assessment & Plan Note (Signed)
Hypertension adequately controlled current regimen and below goal 140/90. No adverse side effects or symptoms of end organ damage. Continue current dosage of lisinopril-hydrochlorothiazide. Continue to monitor blood pressure at home.

## 2015-06-28 NOTE — Assessment & Plan Note (Signed)
Symptoms and exam consistent with sinus pressure improved with previous course of azithromycin however continues to have symptoms. Refill azithromycin. Follow up if symptoms worsen or do not improve with medication.

## 2015-06-29 ENCOUNTER — Encounter: Payer: Self-pay | Admitting: Family

## 2015-06-29 LAB — TSH: TSH: 1.17 u[IU]/mL (ref 0.35–4.50)

## 2015-07-07 ENCOUNTER — Encounter: Payer: Self-pay | Admitting: Gastroenterology

## 2015-07-10 ENCOUNTER — Encounter: Payer: Self-pay | Admitting: Family

## 2015-07-10 ENCOUNTER — Ambulatory Visit
Admission: RE | Admit: 2015-07-10 | Discharge: 2015-07-10 | Disposition: A | Discharge status: Home / Self Care | Payer: BLUE CROSS/BLUE SHIELD | Source: Ambulatory Visit | Attending: Family | Admitting: Family

## 2015-07-10 DIAGNOSIS — E039 Hypothyroidism, unspecified: Secondary | ICD-10-CM

## 2015-08-28 ENCOUNTER — Ambulatory Visit: Payer: BLUE CROSS/BLUE SHIELD | Admitting: Neurology

## 2015-08-30 ENCOUNTER — Encounter: Payer: BLUE CROSS/BLUE SHIELD | Admitting: Gastroenterology

## 2015-08-31 ENCOUNTER — Telehealth: Payer: Self-pay | Admitting: Neurology

## 2015-08-31 ENCOUNTER — Telehealth: Payer: Self-pay | Admitting: Family

## 2015-08-31 ENCOUNTER — Ambulatory Visit: Payer: BLUE CROSS/BLUE SHIELD | Admitting: Neurology

## 2015-08-31 NOTE — Telephone Encounter (Signed)
PT called and said her appointment with Dr Delice Lesch today was to release her to drive and go back to work/Dawn CB# [phone]

## 2015-08-31 NOTE — Telephone Encounter (Signed)
Please inform patient that I am more than happy to write a note for the day we saw her for her office visit, but I am not willing to write a note covering her for "a few weeks off of school."

## 2015-08-31 NOTE — Telephone Encounter (Signed)
Please advise 

## 2015-08-31 NOTE — Telephone Encounter (Signed)
CRelation to [id number] Call back number:[phone]   Reason for call:  patient requesting Dr. Note stating patient was seen in the office 06/28/15. Patient states she took a few weeks off from school due to her being sick and now there requesting documentation.

## 2015-09-01 NOTE — Telephone Encounter (Signed)
Returned call. Patient states that she was supposed to be released to drive. Patients last episode of near synocpe & seizure like activity was on 03/05/15. Patient states that she has been episode free since that time. She is coming up on her 6 month mark on Sunday 09/03/15. I did tell patient since she hasn't had any episodes since 03/05/15 she is ok to start driving again. I did advise that since she wasn't able to see Dr. Delice Lesch this week that we do schedule a f/u with her for when she comes back from leave. Patient was agreeable f/u scheduled for 11/23/15.

## 2015-09-01 NOTE — Telephone Encounter (Signed)
LVM letting pt know.  

## 2015-09-05 ENCOUNTER — Encounter (HOSPITAL_COMMUNITY): Payer: Self-pay | Admitting: Emergency Medicine

## 2015-09-05 ENCOUNTER — Emergency Department (HOSPITAL_COMMUNITY)
Admission: EM | Admit: 2015-09-05 | Discharge: 2015-09-05 | Disposition: A | Discharge status: Home / Self Care | Payer: BLUE CROSS/BLUE SHIELD | Attending: Emergency Medicine | Admitting: Emergency Medicine

## 2015-09-05 DIAGNOSIS — Z880 Allergy status to penicillin: Secondary | ICD-10-CM | POA: Diagnosis not present

## 2015-09-05 DIAGNOSIS — Z8742 Personal history of other diseases of the female genital tract: Secondary | ICD-10-CM | POA: Diagnosis not present

## 2015-09-05 DIAGNOSIS — Z79899 Other long term (current) drug therapy: Secondary | ICD-10-CM | POA: Diagnosis not present

## 2015-09-05 DIAGNOSIS — R21 Rash and other nonspecific skin eruption: Secondary | ICD-10-CM

## 2015-09-05 DIAGNOSIS — R011 Cardiac murmur, unspecified: Secondary | ICD-10-CM | POA: Diagnosis not present

## 2015-09-05 DIAGNOSIS — Z86018 Personal history of other benign neoplasm: Secondary | ICD-10-CM | POA: Insufficient documentation

## 2015-09-05 DIAGNOSIS — Z7951 Long term (current) use of inhaled steroids: Secondary | ICD-10-CM | POA: Insufficient documentation

## 2015-09-05 DIAGNOSIS — Z7982 Long term (current) use of aspirin: Secondary | ICD-10-CM | POA: Insufficient documentation

## 2015-09-05 DIAGNOSIS — M069 Rheumatoid arthritis, unspecified: Secondary | ICD-10-CM | POA: Insufficient documentation

## 2015-09-05 DIAGNOSIS — Z862 Personal history of diseases of the blood and blood-forming organs and certain disorders involving the immune mechanism: Secondary | ICD-10-CM | POA: Insufficient documentation

## 2015-09-05 DIAGNOSIS — E079 Disorder of thyroid, unspecified: Secondary | ICD-10-CM | POA: Insufficient documentation

## 2015-09-05 DIAGNOSIS — L299 Pruritus, unspecified: Secondary | ICD-10-CM | POA: Diagnosis present

## 2015-09-05 MED ORDER — FAMOTIDINE 20 MG PO TABS
20.0000 mg | ORAL_TABLET | Freq: Once | ORAL | Status: AC
  Administered 2015-09-05: 20 mg via ORAL
  Filled 2015-09-05: qty 1

## 2015-09-05 MED ORDER — DIPHENHYDRAMINE HCL 25 MG PO CAPS
25.0000 mg | ORAL_CAPSULE | Freq: Once | ORAL | Status: AC
  Administered 2015-09-05: 25 mg via ORAL
  Filled 2015-09-05: qty 1

## 2015-09-05 MED ORDER — KETOCONAZOLE 2 % EX CREA: TOPICAL_CREAM | CUTANEOUS | Status: DC

## 2015-09-05 MED ORDER — PREDNISONE 20 MG PO TABS
60.0000 mg | ORAL_TABLET | Freq: Once | ORAL | Status: AC
  Administered 2015-09-05: 60 mg via ORAL
  Filled 2015-09-05: qty 3

## 2015-09-05 MED ORDER — PREDNISONE 20 MG PO TABS: 40.0000 mg | ORAL_TABLET | Freq: Every day | ORAL | Status: DC

## 2015-09-05 NOTE — ED Provider Notes (Signed)
CSN: [id number]     Arrival date & time 09/05/15  2019 History  By signing my name below, I, April Gonzalez, attest that this documentation has been prepared under the direction and in the presence of April Gonzalez, [employer]. Electronically Signed: Virgel Gonzalez, ED Scribe. 09/05/2015. 9:06 PM.   Chief Complaint  Patient presents with  . Insect Bite   The history is provided by the patient. No language interpreter was used.   HPI Comments: April Gonzalez is a 55 y.o. female who presents to the Emergency Department complaining of small, mildly painful and pruritic, circular area of redness to her right abdomen that she noticed this morning upon waking. Patient states that she was visiting [place] 2 days ago but notes that she was asymptomatic until waking this morning. Pain is worse with palpation. She has not taken any medications or attempted any treatments. She notes similar symptoms on her knee that resolved with cleaning the area with isopropyl alcohol. She takes her levothyroxine and losartan-hydrochlorothiazide regularly. She has a PCP currently. Denies fevers and chills.  Past Medical History  Diagnosis Date  . Seizures ([place])     last seizure age 73 or 10 per pt report  . RA (rheumatoid arthritis) ([facility])     states was dx at age 54 but not on meds  . H/O uterine prolapse   . Thyroid disorder   . Fibroids     H/O myomectomy  . Anemia 05/2012    transfusion  . Heart murmur   . History of blood transfusion    Past Surgical History  Procedure Laterality Date  . Excision vaginal cyst    . Tubal ligation  10/10/1991  . Myomectomy  33YRS AGO    HYSTEROSCOPIC  . Vaginal hysterectomy N/A 12/10/2012    Procedure: Total Vaginal Hysterectomy;  Surgeon: Eldred Manges, MD;  Location: [facility];  Service: Gynecology;  Laterality: N/A;  . Cystocele repair N/A 12/10/2012    Procedure: ANTERIOR REPAIR (CYSTOCELE);  Surgeon: Eldred Manges, MD;  Location: [facility];  Service: Gynecology;   Laterality: N/A;  . Bladder suspension N/A 12/10/2012    Procedure: TRANSVAGINAL TAPE (TVT) PROCEDURE;  Surgeon: Delice Lesch, MD;  Location: [facility];  Service: Gynecology;  Laterality: N/A;  . Cystoscopy N/A 12/10/2012    Procedure: CYSTOSCOPY;  Surgeon: Delice Lesch, MD;  Location: [facility];  Service: Gynecology;  Laterality: N/A;   Family History  Problem Relation Age of Onset  . Heart failure Father   . Diabetes Mother   . High blood pressure Mother   . Arthritis Mother   . Hypertension Mother   . Breast cancer Maternal Aunt     in her 80's   . Breast cancer Cousin   . Thyroid disease Sister    Social History  Substance Use Topics  . Smoking status: Never Smoker   . Smokeless tobacco: Never Used  . Alcohol Use: 0.0 oz/week    0 Standard drinks or equivalent per week     Comment: rare   OB History    Gravida Para Term Preterm AB TAB SAB Ectopic Multiple Living   2 2 2       2      Review of Systems  Constitutional: Negative for fever and chills.  Skin: Positive for color change and wound.   Allergies  Methimazole; Nitroglycerin; Other; Tetracycline hcl; Penicillins; and Terbutaline  Home Medications   Prior to Admission medications   Medication Sig Start Date End  Date Taking? Authorizing Provider  aspirin EC 81 MG tablet Take 81 mg by mouth daily. Reported on 05/17/2015 01/17/15   Hendricks Limes, MD  azithromycin (ZITHROMAX) 250 MG tablet Take 2 tablets by mouth for 1 day and then 1 tablet by mouth daily for 4 days. 06/28/15   Golden Circle, FNP  fluticasone (FLONASE) 50 MCG/ACT nasal spray Place 2 sprays into both nostrils daily. Patient taking differently: Place 2 sprays into both nostrils daily as needed for allergies.  02/06/15   Lysbeth Penner, FNP  levothyroxine (SYNTHROID, LEVOTHROID) 25 MCG tablet 25 mcg. Take 1 tablet daily 04/21/15   Historical Provider, MD  losartan-hydrochlorothiazide (HYZAAR) 50-12.5 MG tablet Take 1 tablet by mouth daily. 06/28/15    Golden Circle, FNP  predniSONE (DELTASONE) 20 MG tablet Take 2 tablets (40 mg total) by mouth daily. 09/05/15   Dariel Betzer Pilcher Jodi Kappes, PA-C   BP 149/92 mmHg  Pulse 79  Temp(Src) 98.5 F (36.9 C) (Oral)  Resp 20  Ht 5\' 3"  (1.6 m)  Wt 87.119 kg  BMI 34.03 kg/m2  SpO2 98%  LMP 12/03/2012 Physical Exam  Constitutional: She is oriented to person, place, and time. She appears well-developed and well-nourished.  Alert and in no acute distress  HENT:  Head: Normocephalic and atraumatic.  Cardiovascular: Normal rate, regular rhythm and normal heart sounds.  Exam reveals no gallop and no friction rub.   No murmur heard. Pulmonary/Chest: Effort normal and breath sounds normal. No respiratory distress. She has no wheezes. She has no rales. She exhibits no tenderness.  Abdominal: Soft. Bowel sounds are normal. She exhibits no distension and no mass. There is no tenderness. There is no rebound and no guarding.  Musculoskeletal: She exhibits no edema.  Neurological: She is alert and oriented to person, place, and time.  Skin: Skin is warm and dry.  Annular lesion to right lateral abdomen.   Nursing note and vitals reviewed.   ED Course  Procedures   DIAGNOSTIC STUDIES: Oxygen Saturation is 98% on RA, normal by my interpretation.    COORDINATION OF CARE: 8:56 PM Will order Benadryl. Will prescribe topical antifungal cream. Discussed treatment plan with pt at bedside and pt agreed to plan.   MDM   Final diagnoses:  Rash   April Gonzalez presents to ED for what she believed was an insect bite, however she did not see any insect biting her. On exam, patient has singular annular lesion on her abdomen. Mildly tender to touch. Patient denies any difficulty breathing or swallowing. Pt has a patent airway without stridor and is handling secretions without difficulty; no angioedema. No blisters, no pustules, no warmth, no draining sinus tracts, no superficial abscesses, no bullous impetigo, no  vesicles, no desquamation, no target lesions with dusky purpura or a central bulla. No concern for superimposed infection. No concern for SJS, TEN, TSS, tick borne illness, syphilis or other life-threatening condition.   Prior to discharge, patient was re-evaluated - she is now complaining of "itching all over" and now has urticaria rash. Patient seen by and discussed with Dr. Vanita Panda who recommends benadryl, pepcid, and steroids for a few days. PCP follow up discussed.   I personally performed the services described in this documentation, which was scribed in my presence. The recorded information has been reviewed and is accurate.   [facility] Jaishon Krisher, PA-C 09/05/15 2233  Carmin Muskrat, MD 09/05/15 [phone]

## 2015-09-05 NOTE — ED Notes (Signed)
Patient going home with family.

## 2015-09-05 NOTE — ED Notes (Signed)
Pt. reports insect bite at right lateral abdomen today with itching and erythema , respirations unlabored / no fever .

## 2015-09-05 NOTE — Discharge Instructions (Signed)
1. Medications: Use prednisone as directed, benadryl and/or pepcid over the counter as needed for itching, continue usual home medications 2. Treatment: rest, drink plenty of fluids, ice can be used for pain.  3. Follow Up: Please follow up with your primary doctor in 3 days for discussion of your diagnoses and further evaluation after today's visit; Please return to the ER for new or worsening symptoms, any additional concerns.

## 2015-09-05 NOTE — ED Notes (Signed)
Upon entering patient room to DC, noticed patient CO increased itching all over and welps on body. Notified PA. See orders. Patient states onset of itching on side was in [place] while getting on plane.

## 2015-09-08 ENCOUNTER — Ambulatory Visit (INDEPENDENT_AMBULATORY_CARE_PROVIDER_SITE_OTHER): Payer: BLUE CROSS/BLUE SHIELD | Admitting: Family Medicine

## 2015-09-08 ENCOUNTER — Encounter: Payer: Self-pay | Admitting: Family Medicine

## 2015-09-08 ENCOUNTER — Ambulatory Visit: Payer: BLUE CROSS/BLUE SHIELD | Admitting: Family

## 2015-09-08 ENCOUNTER — Telehealth: Payer: Self-pay | Admitting: Family

## 2015-09-08 VITALS — BP 122/82 | HR 95 | Temp 97.5°F | Resp 14 | Ht 63.0 in | Wt 189.4 lb

## 2015-09-08 DIAGNOSIS — L538 Other specified erythematous conditions: Secondary | ICD-10-CM | POA: Diagnosis not present

## 2015-09-08 DIAGNOSIS — Z6833 Body mass index (BMI) 33.0-33.9, adult: Secondary | ICD-10-CM

## 2015-09-08 MED ORDER — HYDROXYZINE HCL 10 MG PO TABS: 10.0000 mg | ORAL_TABLET | Freq: Three times a day (TID) | ORAL | Status: AC | PRN

## 2015-09-08 MED ORDER — LIDOCAINE HCL 3 % EX CREA
1.0000 | TOPICAL_CREAM | Freq: Three times a day (TID) | CUTANEOUS | Status: AC | PRN
Start: 2015-09-08 — End: 2015-09-15

## 2015-09-08 MED ORDER — TRIAMCINOLONE ACETONIDE 0.1 % EX CREA: 1.0000 "application " | TOPICAL_CREAM | Freq: Two times a day (BID) | CUTANEOUS | Status: AC

## 2015-09-08 NOTE — Progress Notes (Signed)
Subjective:    Patient ID: April Gonzalez, female    DOB: 1960/08/31, 55 y.o.   MRN: [id number]  HPI  [name] is a 55 y.o.female here today c/o painful rash on abdomen, attributed to insect bite.  She was in [place] visiting friends from April 28 to April 30, she noted cloudy to [place] on right side of abdomen area she was in the airplane coming back (09/03/15), a few minutes later she started with stingging pain sensation. She also noted soreness of hard palate and lightheadedness. Rash pain and pruritus were worsening, so she went to ER yesterday, 09/07/15.   She did not see any insect but assumes this is what may have caused rash.  Lesions is pruritic and tender, moderate stinging, exacerbated by palpation and light rubbing from clothes; alleviated by avoiding touching lesion.  No numbness or tingling.  She was seen yesterday in the ER and treated with Benadryl,Pepcid, and discharged with Rx of oral Prednisone. She is just taking Prednisone 40 mg daily, has not taken it today.   She denies any new medication, detergent, soap, or body product. No known outdoor exposures to plants. Friends have dogs. No sick contact. No Hx of eczema or similar rash in the past.  She denies fever,chills, oral lesions/edema,cough, wheezing, dyspnea, abdominal pain, nausea, or vomiting. She has history of constipation but for the past couple days she has had soft stools, about 2 stools daily, no mucus or blood.  She has ben eating normal, including meet, tolerating well.  In general rash is better today, palate soreness and lightheadedness resolved.   Obesity: She is also concerned about not being able to lose weight, which she attributes to her history of hypothyroidism. She is not exercising regularly due to chronic right ankle pain, she has not been consistent with a healthy diet.   Review of Systems  Constitutional: Negative for fever, chills, activity change, appetite change, fatigue and  unexpected weight change.  HENT: Negative for congestion, ear pain, mouth sores, nosebleeds, rhinorrhea, sneezing, sore throat, trouble swallowing and voice change.   Eyes: Negative for redness and itching.  Respiratory: Negative for cough, shortness of breath and wheezing.   Cardiovascular: Negative for chest pain, palpitations and leg swelling.  Gastrointestinal: Positive for diarrhea. Negative for nausea, vomiting, abdominal pain and blood in stool.  Endocrine: Negative for cold intolerance, heat intolerance, polydipsia, polyphagia and polyuria.  Genitourinary: Negative for decreased urine volume.  Musculoskeletal: Negative for myalgias and arthralgias.  Skin: Positive for rash. Negative for wound.  Allergic/Immunologic: Negative for environmental allergies and food allergies.  Neurological: Negative for tremors, speech difficulty, weakness, numbness and headaches.  Hematological: Negative for adenopathy. Does not bruise/bleed easily.  Psychiatric/Behavioral: Negative for hallucinations and confusion. The patient is not nervous/anxious.      Current Outpatient Prescriptions on File Prior to Visit  Medication Sig Dispense Refill  . aspirin EC 81 MG tablet Take 81 mg by mouth daily. Reported on 05/17/2015    . levothyroxine (SYNTHROID, LEVOTHROID) 25 MCG tablet 25 mcg. Take 1 tablet daily  2  . losartan-hydrochlorothiazide (HYZAAR) 50-12.5 MG tablet Take 1 tablet by mouth daily. 90 tablet 3  . predniSONE (DELTASONE) 20 MG tablet Take 2 tablets (40 mg total) by mouth daily. 8 tablet 0  . fluticasone (FLONASE) 50 MCG/ACT nasal spray Place 2 sprays into both nostrils daily. (Patient not taking: Reported on 09/08/2015) 16 g 2   No current facility-administered medications on file prior to visit.  Past Medical History  Diagnosis Date  . Seizures ([place])     last seizure age 41 or 10 per pt report  . RA (rheumatoid arthritis) ([facility])     states was dx at age 61 but not on meds  . H/O uterine  prolapse   . Thyroid disorder   . Fibroids     H/O myomectomy  . Anemia 05/2012    transfusion  . Heart murmur   . History of blood transfusion     Social History   Social History  . Marital Status: Married    Spouse Name: N/A  . Number of Children: 2  . Years of Education: 15   Social History Main Topics  . Smoking status: Never Smoker   . Smokeless tobacco: Never Used  . Alcohol Use: 0.0 oz/week    0 Standard drinks or equivalent per week     Comment: rare  . Drug Use: No  . Sexual Activity: Yes    Birth Control/ Protection: Surgical     Comment: BTL    Other Topics Concern  . None   Social History Narrative   Regular exercise-no   Caffeine Use-yes   Lives with husband and brother in law in one story home.  Does not work.  Full time student.  Has 2 children.    Filed Vitals:   09/08/15 0914  BP: 122/82  Pulse: 95  Temp: 97.5 F (36.4 C)  Resp: 14   Body mass index is 33.56 kg/(m^2).  SpO2 Readings from Last 3 Encounters:  09/08/15 96%  09/05/15 98%  06/28/15 96%       Objective:   Physical Exam  Constitutional: She is oriented to person, place, and time. She appears well-developed. She does not appear ill. No distress.  HENT:  Head: Atraumatic.  Mouth/Throat: Uvula is midline, oropharynx is clear and moist and mucous membranes are normal. No posterior oropharyngeal edema or posterior oropharyngeal erythema.  Eyes: Conjunctivae, EOM and lids are normal. Pupils are equal, round, and reactive to light.  Neck: Normal range of motion. Neck supple. No muscular tenderness present.  Cardiovascular: Normal rate, regular rhythm and S1 normal.   No murmur heard. Pulmonary/Chest: Effort normal and breath sounds normal. No respiratory distress. She has no wheezes. She has no rales.  Abdominal: There is no hepatosplenomegaly. There is tenderness (mild tenderness upon palpation LLQ). There is no rigidity, no rebound and no guarding.  Musculoskeletal: Normal range  of motion. She exhibits no edema or tenderness.  No signs of synovitis appreciated.  Lymphadenopathy:    She has no cervical adenopathy.       Right: No supraclavicular adenopathy present.       Left: No supraclavicular adenopathy present.  Neurological: She is alert and oriented to person, place, and time. She has normal strength. No cranial nerve deficit or sensory deficit. Coordination and gait normal.  Skin: Skin is warm. Rash ( ) noted. No cyanosis or erythema.     3-4 cm macular, erythematous lesion, no clear center, tender upon light tough, no induration or local heat, no vesicular lesions. < 1 mm micropapular lesion in center. No other rash appreciated even though she scratches neck and upper chest a few times during visit but skin on those areas is normal.  Psychiatric: Her speech is normal. Her mood appears anxious.        Assessment & Plan:    Myley was seen today for insect bite.  Diagnoses and all orders for this  visit:  Macular erythematous rash  Not sure about the specific etiology but insect bite is certainly very likely posibility.  I am thinking  insect bite might have occured while she was either at the airport or in the plane coming back home, since she didn't have any symptom while she was visiting in [place] (09/01/15 ). Symptoms seem to be improving with oral prednisone, some side effects discussed. Today I am adding topical lidocaine and steroid cream. Hydroxyzine side effects reviewed, she is not working this week. She was clearly instructed about warning signs, explained he might take a few weeks for admission to for swollen completely but should not get worse. Follow-up as needed.  -     hydrOXYzine (ATARAX/VISTARIL) 10 MG tablet; Take 1 tablet (10 mg total) by mouth 3 (three) times daily as needed. -     triamcinolone cream (KENALOG) 0.1 %; Apply 1 application topically 2 (two) times daily. -     lidocaine (LINDAMANTLE) 3 % CREA cream; Apply 1 application  topically 3 (three) times daily as needed.   BMI 33.0-33.9,adult  We discussed benefits of wt loss as well as adverse effects of obesity. Consistency with healthy diet and physical activity recommended. Because her right ankle pain she needs to do low impact exercise, walking in place is a good option. Weight Watchers recommended.  Increase fiber and fluid intake to treat chronic constipation.     -Patient advised to return or notify a doctor immediately if symptoms worsen or persist or new concerns arise.      Betty G. Martinique, MD  [facility]. [place] office.

## 2015-09-08 NOTE — Telephone Encounter (Signed)
Left message on machine to call back  

## 2015-09-08 NOTE — Patient Instructions (Signed)
A few things to remember from today's visit:   1. Macular erythematous rash  - hydrOXYzine (ATARAX/VISTARIL) 10 MG tablet; Take 1 tablet (10 mg total) by mouth 3 (three) times daily as needed.  Dispense: 30 tablet; Refill: 0 - triamcinolone cream (KENALOG) 0.1 %; Apply 1 application topically 2 (two) times daily.  Dispense: 30 g; Refill: 0 - lidocaine (LINDAMANTLE) 3 % CREA cream; Apply 1 application topically 3 (three) times daily as needed.  Dispense: 30 g; Refill: 0   Continue Prednisone. If vomiting, facial edema, wheezing,difficulty breathing, or worsening symptoms please seem immediate medical evaluation.   2. BMI 33.0-33.9,adult  What are some tips for weight loss? People become overweight for many reasons. Weight issues can run in families. They can be caused by unhealthy behaviors and a person's environment. Certain health problems and medicines can also lead to weight gain. There are some simple things you can do to reach and maintain a healthy weight:  Eat 500 fewer calories per day than your body needs to maintain your weight. Women should aim for no more than 1,200 to 1,500 calories per day. Men should aim for 1,500 to 1,800 calories per day. Avoid sweet drinks. These include regular soft drinks, fruit juices, fruit drinks, energy drinks, sweetened iced tea, and flavored milk. Avoid fast foods. Fast foods such as french fries, hamburgers, chicken nuggets, and pizza are high in calories and can cause weight gain. Eat a healthy breakfast. People who skip breakfast tend to weigh more. Don't watch more than two hours of television per day. Chew sugar-free gum between meals to cut down on snacking. Avoid grocery shopping when you're hungry. Pack a healthy lunch instead of eating out to control what and how much you eat. Eat a lot of fruits and vegetables. Aim for about 2 cups of fruit and 2 to 3 cups of vegetables per day. Aim for 150 minutes per week of moderate-intensity  exercise (such as brisk walking), or 75 minutes per week of vigorous exercise (such as jogging or running). Be more active. Small changes in physical activity can easily be added to your daily routine. For example, take the stairs instead of the elevator. Take a walk with your family. A daily walk is a great way to get exercise and to catch up on the day's events.        If you sign-up for My chart, you can communicate easier with [place] in case you have any question or concern.

## 2015-09-08 NOTE — Telephone Encounter (Signed)
She can continue Prednisone and start medication prescribed today, no problem taking both. Yes, she can take a daily Probiotic. Thanks. BJ

## 2015-09-08 NOTE — Telephone Encounter (Signed)
Please advise 

## 2015-09-08 NOTE — Telephone Encounter (Signed)
Pt would like to know if she should be taking both medication the one from the ER and the one you prescribe today, And also is she can take Probiotic?

## 2015-09-13 NOTE — Telephone Encounter (Signed)
Unable to reach patient. Left a detailed message to take medications, use probiotic.

## 2015-09-18 ENCOUNTER — Encounter: Payer: Self-pay | Admitting: Family

## 2015-09-18 ENCOUNTER — Ambulatory Visit (INDEPENDENT_AMBULATORY_CARE_PROVIDER_SITE_OTHER): Payer: BLUE CROSS/BLUE SHIELD | Admitting: Family

## 2015-09-18 ENCOUNTER — Ambulatory Visit: Payer: BLUE CROSS/BLUE SHIELD | Admitting: Family Medicine

## 2015-09-18 VITALS — BP 114/74 | HR 96 | Temp 98.3°F | Resp 18 | Ht 63.0 in | Wt 192.2 lb

## 2015-09-18 DIAGNOSIS — L500 Allergic urticaria: Secondary | ICD-10-CM

## 2015-09-18 MED ORDER — EPINEPHRINE 0.3 MG/0.3ML IJ SOAJ: 0.3000 mg | Freq: Once | INTRAMUSCULAR | Status: DC

## 2015-09-18 MED ORDER — FAMOTIDINE 20 MG PO TABS: 20.0000 mg | ORAL_TABLET | Freq: Two times a day (BID) | ORAL | Status: DC

## 2015-09-18 MED ORDER — CETIRIZINE HCL 10 MG PO TABS: 10.0000 mg | ORAL_TABLET | Freq: Every day | ORAL | Status: DC

## 2015-09-18 MED ORDER — PREDNISONE 10 MG PO TABS: ORAL_TABLET | ORAL | Status: AC

## 2015-09-18 NOTE — Patient Instructions (Signed)
Start prednisone this afternoon. Start zyrtec 10mg  once daily. Start pepcid 20mg  twice daily for the next 1 week. If you have itching/hives despite zyrtec, you may use benadryl 1 tablet every 6 hours as needed. If you have difficulty breathing due to throat/tongue/lip swelling, inject epipen and call 911. You will be contacted about your referral to the allergist. Avoid shellfish until you are cleared by the allergist to eat shellfish.

## 2015-09-18 NOTE — Progress Notes (Signed)
Pre visit review using our clinic review tool, if applicable. No additional management support is needed unless otherwise documented below in the visit note. 

## 2015-09-18 NOTE — Progress Notes (Addendum)
Subjective:    Patient ID: April Gonzalez, female    DOB: 08/23/1960, 55 y.o.   MRN: [id number]  HPI  April Gonzalez is a 55 yr old female who presents today to discuss insect bite which occurred last Thursday on her neck.  She reports associated itching/swelling of neck since that time. She did complete a 4 day course of prednisone. Has generalized itching.    Went to the E on 5/2 and was given rx for prednisone, hydroxyzine.   Chart review shows that she saw Dr. Betty Martinique on 5/5 due to painful rash on her abdomen. She was given rx for atarax, kenalot and topical lidocaine cream at that appointment.  Reports some sensation of throat/tongue swelling last night which resolved.  Denies wheezing.  Has had HA and felt that her "heart has sped up." Reports that itching improved while on prednisone.  A few days later she began to itch again.  Reports itching/swelling and pain resolve from the lesion on her right lateral abdomen.  Denies known food allergies.  She thinks that she ate shrimp the night it occurred. Ate it again 2 nights ago without any worsening of her symptoms.     Review of Systems    see HPI  Past Medical History  Diagnosis Date  . Seizures ([place])     last seizure age 60 or 10 per pt report  . RA (rheumatoid arthritis) ([facility])     states was dx at age 42 but not on meds  . H/O uterine prolapse   . Thyroid disorder   . Fibroids     H/O myomectomy  . Anemia 05/2012    transfusion  . Heart murmur   . History of blood transfusion      Social History   Social History  . Marital Status: Married    Spouse Name: N/A  . Number of Children: 2  . Years of Education: 15   Occupational History  . Not on file.   Social History Main Topics  . Smoking status: Never Smoker   . Smokeless tobacco: Never Used  . Alcohol Use: 0.0 oz/week    0 Standard drinks or equivalent per week     Comment: rare  . Drug Use: No  . Sexual Activity: Yes    Birth Control/ Protection: Surgical    Comment: BTL    Other Topics Concern  . Not on file   Social History Narrative   Regular exercise-no   Caffeine Use-yes   Lives with husband and brother in law in one story home.  Does not work.  Full time student.  Has 2 children.    Past Surgical History  Procedure Laterality Date  . Excision vaginal cyst    . Tubal ligation  10/10/1991  . Myomectomy  89YRS AGO    HYSTEROSCOPIC  . Vaginal hysterectomy N/A 12/10/2012    Procedure: Total Vaginal Hysterectomy;  Surgeon: Eldred Manges, MD;  Location: [facility];  Service: Gynecology;  Laterality: N/A;  . Cystocele repair N/A 12/10/2012    Procedure: ANTERIOR REPAIR (CYSTOCELE);  Surgeon: Eldred Manges, MD;  Location: [facility];  Service: Gynecology;  Laterality: N/A;  . Bladder suspension N/A 12/10/2012    Procedure: TRANSVAGINAL TAPE (TVT) PROCEDURE;  Surgeon: Delice Lesch, MD;  Location: [facility];  Service: Gynecology;  Laterality: N/A;  . Cystoscopy N/A 12/10/2012    Procedure: CYSTOSCOPY;  Surgeon: Delice Lesch, MD;  Location: [facility];  Service: Gynecology;  Laterality: N/A;  Family History  Problem Relation Age of Onset  . Heart failure Father   . Diabetes Mother   . High blood pressure Mother   . Arthritis Mother   . Hypertension Mother   . Breast cancer Maternal Aunt     in her 25's   . Breast cancer Cousin   . Thyroid disease Sister     Allergies  Allergen Reactions  . Methimazole Anaphylaxis  . Nitroglycerin Other (See Comments)    REACTION: sweating  . Other Other (See Comments)    Seasonal / dog hair   . Tetracycline Hcl Other (See Comments)    REACTION: elevated blood pressure  . Penicillins Other (See Comments)    unknown  . Terbutaline Other (See Comments)    Current Outpatient Prescriptions on File Prior to Visit  Medication Sig Dispense Refill  . hydrOXYzine (ATARAX/VISTARIL) 10 MG tablet Take 1 tablet (10 mg total) by mouth 3 (three) times daily as needed. 30 tablet 0  . levothyroxine  (SYNTHROID, LEVOTHROID) 25 MCG tablet 25 mcg. Take 1 tablet daily  2  . losartan-hydrochlorothiazide (HYZAAR) 50-12.5 MG tablet Take 1 tablet by mouth daily. 90 tablet 3  . triamcinolone cream (KENALOG) 0.1 % Apply 1 application topically 2 (two) times daily. 30 g 0  . aspirin EC 81 MG tablet Take 81 mg by mouth daily. Reported on 09/18/2015     No current facility-administered medications on file prior to visit.    BP 114/74 mmHg  Pulse 96  Temp(Src) 98.3 F (36.8 C) (Oral)  Resp 18  Ht 5\' 3"  (1.6 m)  Wt 192 lb 3.2 oz (87.181 kg)  BMI 34.06 kg/m2  SpO2 96%  LMP 12/03/2012    Objective:   Physical Exam  Constitutional: She appears well-developed and well-nourished.  HENT:  Denies tongue/lip swelling  Cardiovascular: Normal rate, regular rhythm and normal heart sounds.   No murmur heard. Pulmonary/Chest: Effort normal and breath sounds normal. No respiratory distress. She has no wheezes.  Skin:  + erythema/tenderness/induration beneath chin with hive noted on surface.    Hyperpigementation right lateral abdomen at site of previous lesion evaluated on 5/5.   Psychiatric: She has a normal mood and affect. Her behavior is normal. Judgment and thought content normal.          Assessment & Plan:  Allergic Urticaria-Pt declines steroid injection today in office. Therefore advised plan as follows:  Start prednisone this afternoon. Start zyrtec 10mg  once daily. Start pepcid 20mg  twice daily for the next 1 week. If you have itching/hives despite zyrtec, you may use benadryl 1 tablet every 6 hours as needed. If you have difficulty breathing due to throat/tongue/lip swelling, inject epipen and call 911. You will be contacted about your referral to the allergist. Avoid shellfish until you are cleared by the allergist to eat shellfish.

## 2015-09-19 ENCOUNTER — Encounter: Payer: Self-pay | Admitting: Family

## 2015-10-20 ENCOUNTER — Ambulatory Visit (INDEPENDENT_AMBULATORY_CARE_PROVIDER_SITE_OTHER): Payer: BLUE CROSS/BLUE SHIELD | Admitting: Family

## 2015-10-20 VITALS — BP 144/98 | HR 79 | Temp 97.9°F | Resp 16 | Ht 63.0 in | Wt 193.0 lb

## 2015-10-20 DIAGNOSIS — M1990 Unspecified osteoarthritis, unspecified site: Secondary | ICD-10-CM | POA: Insufficient documentation

## 2015-10-20 DIAGNOSIS — R221 Localized swelling, mass and lump, neck: Secondary | ICD-10-CM | POA: Diagnosis not present

## 2015-10-20 DIAGNOSIS — M19042 Primary osteoarthritis, left hand: Secondary | ICD-10-CM | POA: Diagnosis not present

## 2015-10-20 DIAGNOSIS — I10 Essential (primary) hypertension: Secondary | ICD-10-CM

## 2015-10-20 DIAGNOSIS — M19041 Primary osteoarthritis, right hand: Secondary | ICD-10-CM

## 2015-10-20 MED ORDER — LOSARTAN POTASSIUM-HCTZ 50-12.5 MG PO TABS: 1.0000 | ORAL_TABLET | Freq: Every day | ORAL | Status: DC

## 2015-10-20 NOTE — Patient Instructions (Addendum)
Thank you for choosing [facility].  Summary/Instructions:  Please continue to take your medications as prescribed.  Please continue the ibuprofen-esomeprazole.  They will call to schedule your appointment.   Continue to decrease sodium in your diet.   Your prescription(s) have been submitted to your pharmacy or been printed and provided for you. Please take as directed and contact our office if you believe you are having problem(s) with the medication(s) or have any questions.  If your symptoms worsen or fail to improve, please contact our office for further instruction, or in case of emergency go directly to the emergency room at the closest medical facility.   Osteoarthritis Osteoarthritis is a disease that causes soreness and inflammation of a joint. It occurs when the cartilage at the affected joint wears down. Cartilage acts as a cushion, covering the ends of bones where they meet to form a joint. Osteoarthritis is the most common form of arthritis. It often occurs in older people. The joints affected most often by this condition include those in the:  Ends of the fingers.  Thumbs.  Neck.  Lower back.  Knees.  Hips. CAUSES  Over time, the cartilage that covers the ends of bones begins to wear away. This causes bone to rub on bone, producing pain and stiffness in the affected joints.  RISK FACTORS Certain factors can increase your chances of having osteoarthritis, including:  Older age.  Excessive body weight.  Overuse of joints.  Previous joint injury. SIGNS AND SYMPTOMS   Pain, swelling, and stiffness in the joint.  Over time, the joint may lose its normal shape.  Small deposits of bone (osteophytes) may grow on the edges of the joint.  Bits of bone or cartilage can break off and float inside the joint space. This may cause more pain and damage. DIAGNOSIS  Your health care provider will do a physical exam and ask about your symptoms. Various tests may  be ordered, such as:  X-rays of the affected joint.  Blood tests to rule out other types of arthritis. Additional tests may be used to diagnose your condition. TREATMENT  Goals of treatment are to control pain and improve joint function. Treatment plans may include:  A prescribed exercise program that allows for rest and joint relief.  A weight control plan.  Pain relief techniques, such as:  Properly applied heat and cold.  Electric pulses delivered to nerve endings under the skin (transcutaneous electrical nerve stimulation [TENS]).  Massage.  Certain nutritional supplements.  Medicines to control pain, such as:  Acetaminophen.  Nonsteroidal anti-inflammatory drugs (NSAIDs), such as naproxen.  Narcotic or central-acting agents, such as tramadol.  Corticosteroids. These can be given orally or as an injection.  Surgery to reposition the bones and relieve pain (osteotomy) or to remove loose pieces of bone and cartilage. Joint replacement may be needed in advanced states of osteoarthritis. HOME CARE INSTRUCTIONS   Take medicines only as directed by your health care provider.  Maintain a healthy weight. Follow your health care provider's instructions for weight control. This may include dietary instructions.  Exercise as directed. Your health care provider can recommend specific types of exercise. These may include:  Strengthening exercises. These are done to strengthen the muscles that support joints affected by arthritis. They can be performed with weights or with exercise bands to add resistance.  Aerobic activities. These are exercises, such as brisk walking or low-impact aerobics, that get your heart pumping.  Range-of-motion activities. These keep your joints limber.  Balance and agility exercises. These help you maintain daily living skills.  Rest your affected joints as directed by your health care provider.  Keep all follow-up visits as directed by your  health care provider. SEEK MEDICAL CARE IF:   Your skin turns red.  You develop a rash in addition to your joint pain.  You have worsening joint pain.  You have a fever along with joint or muscle aches. SEEK IMMEDIATE MEDICAL CARE IF:  You have a significant loss of weight or appetite.  You have night sweats. [place] of Arthritis and Musculoskeletal and Skin Diseases: www.niams.SouthExposed.es  [employer] on Aging: http://kim-miller.com/  American College of Rheumatology: www.rheumatology.org   This information is not intended to replace advice given to you by your health care provider. Make sure you discuss any questions you have with your health care provider.   Document Released: 04/22/2005 Document Revised: 05/13/2014 Document Reviewed: 12/28/2012 Elsevier Interactive Patient Education [employer].

## 2015-10-20 NOTE — Assessment & Plan Note (Signed)
Symptoms and exam consistent with osteoarthritis of bilateral hands most likely from primary causes. Continue current dosage of ibuprofen-famotidine. Recommend continued physical activity as well as conservative treatment of ice/heat as needed. Consider additional creams for relief. Follow-up if symptoms worsen or do not improve.

## 2015-10-20 NOTE — Progress Notes (Signed)
Subjective:    Patient ID: April Gonzalez, female    DOB: Sep 21, 1960, 55 y.o.   MRN: [id number]  Chief Complaint  Patient presents with  . Joint Swelling    have swelling in hands has not taken her BP meds lost them    HPI:  April Gonzalez is a 55 y.o. female who  has a past medical history of Seizures ([place]); RA (rheumatoid arthritis) ([place]); H/O uterine prolapse; Thyroid disorder; Fibroids; Anemia (05/2012); Heart murmur; and History of blood transfusion. and presents today for an acute office visit.   1.) Hand swelling - This is a new problem. Associated symptom of swelling located in her bilateral hands has been going on for 2 years. There is no pain and denies trauma. Timing of symptoms is generally worse night.  2.) Hypertension - currently maintained on losartan-hydrochlorothiazide. Indicates that she has lost her medication has not been able to take it for several days. Denies any adverse symptoms or worse headache of life.  3.) Neck - Previously evaluated for neck swelling and itchiness and diagnosed with urticaria and treated with a couple of rounds of prednisone. Reports overall improvement with decreased swelling and itching. Denies shortness of breath or symptoms of anaphylaxis.   BP Readings from Last 3 Encounters:  10/20/15 144/98  09/18/15 114/74  09/08/15 122/82    Allergies  Allergen Reactions  . Methimazole Anaphylaxis  . Nitroglycerin Other (See Comments)    REACTION: sweating  . Other Other (See Comments)    Seasonal / dog hair   . Tetracycline Hcl Other (See Comments)    REACTION: elevated blood pressure  . Penicillins Other (See Comments)    unknown  . Terbutaline Other (See Comments)     Current Outpatient Prescriptions on File Prior to Visit  Medication Sig Dispense Refill  . aspirin EC 81 MG tablet Take 81 mg by mouth daily. Reported on 09/18/2015    . cetirizine (ZYRTEC ALLERGY) 10 MG tablet Take 1 tablet (10 mg total) by mouth daily. 30 tablet 0  .  EPINEPHrine (EPIPEN 2-PAK) 0.3 mg/0.3 mL IJ SOAJ injection Inject 0.3 mLs (0.3 mg total) into the muscle once. If you develop difficulty breathing 2 Device 0  . famotidine (PEPCID) 20 MG tablet Take 1 tablet (20 mg total) by mouth 2 (two) times daily. 28 tablet 0  . levothyroxine (SYNTHROID, LEVOTHROID) 25 MCG tablet 25 mcg. Take 1 tablet daily  2   No current facility-administered medications on file prior to visit.     Past Surgical History  Procedure Laterality Date  . Excision vaginal cyst    . Tubal ligation  10/10/1991  . Myomectomy  6YRS AGO    HYSTEROSCOPIC  . Vaginal hysterectomy N/A 12/10/2012    Procedure: Total Vaginal Hysterectomy;  Surgeon: Eldred Manges, MD;  Location: [facility];  Service: Gynecology;  Laterality: N/A;  . Cystocele repair N/A 12/10/2012    Procedure: ANTERIOR REPAIR (CYSTOCELE);  Surgeon: Eldred Manges, MD;  Location: [facility];  Service: Gynecology;  Laterality: N/A;  . Bladder suspension N/A 12/10/2012    Procedure: TRANSVAGINAL TAPE (TVT) PROCEDURE;  Surgeon: Delice Lesch, MD;  Location: [facility];  Service: Gynecology;  Laterality: N/A;  . Cystoscopy N/A 12/10/2012    Procedure: CYSTOSCOPY;  Surgeon: Delice Lesch, MD;  Location: [facility];  Service: Gynecology;  Laterality: N/A;    Past Medical History  Diagnosis Date  . Seizures ([place])     last seizure age 76 or 15 per  pt report  . RA (rheumatoid arthritis) ([facility])     states was dx at age 24 but not on meds  . H/O uterine prolapse   . Thyroid disorder   . Fibroids     H/O myomectomy  . Anemia 05/2012    transfusion  . Heart murmur   . History of blood transfusion      Review of Systems  Constitutional: Negative for fever and chills.  Eyes: Negative for visual disturbance.  Respiratory: Negative for chest tightness and shortness of breath.   Cardiovascular: Negative for chest pain, palpitations and leg swelling.  Musculoskeletal:       Positive for hand pain.   Neurological: Negative for  headaches.      Objective:    BP 144/98 mmHg  Pulse 79  Temp(Src) 97.9 F (36.6 C) (Oral)  Resp 16  Ht 5\' 3"  (1.6 m)  Wt 193 lb (87.544 kg)  BMI 34.20 kg/m2  SpO2 95%  LMP 12/03/2012 Nursing note and vital signs reviewed.  Physical Exam  Constitutional: She is oriented to person, place, and time. She appears well-developed and well-nourished. No distress.  Cardiovascular: Normal rate, regular rhythm, normal heart sounds and intact distal pulses.   Pulmonary/Chest: Effort normal and breath sounds normal.  Musculoskeletal:  Bilateral hands - no obvious deformity, discoloration, or edema. Tenderness elicited over proximal, distal, and metacarpophalangeal joints. Range of motion is within normal limits. Distal pulses and sensation are intact and appropriate. Strength is normal.  Neurological: She is alert and oriented to person, place, and time.  Skin: Skin is warm and dry.  Psychiatric: She has a normal mood and affect. Her behavior is normal. Judgment and thought content normal.       Assessment & Plan:   Problem List Items Addressed This Visit      Cardiovascular and Mediastinum   Essential hypertension - Primary    Hypertension remains above goal 140/90 with patient not taking her medication currently secondary to misplacement of her prescription. Refill losartan-hydrochlorothiazide. Encouraged to continue to monitor blood pressure at home as able. Decrease sodium in diet. Recheck blood pressure next office visit.      Relevant Medications   losartan-hydrochlorothiazide (HYZAAR) 50-12.5 MG tablet     Musculoskeletal and Integument   Osteoarthritis    Symptoms and exam consistent with osteoarthritis of bilateral hands most likely from primary causes. Continue current dosage of ibuprofen-famotidine. Recommend continued physical activity as well as conservative treatment of ice/heat as needed. Consider additional creams for relief. Follow-up if symptoms worsen or do not  improve.      Relevant Medications   Ibuprofen-Famotidine (DUEXIS) 800-26.6 MG TABS     Other   Neck swelling    Continues to experience associated symptom of neck discomfort where she previously was treated for inflammation with multiple rounds of prednisone anti-inflammatories. Exam today is benign with no specific findings. Consider referral to dermatology or ear nose and throat.          I am having Ms. Kann maintain her aspirin EC, levothyroxine, EPINEPHrine, cetirizine, famotidine, Ibuprofen-Famotidine, and losartan-hydrochlorothiazide.   Meds ordered this encounter  Medications  . Ibuprofen-Famotidine (DUEXIS) 800-26.6 MG TABS    Sig: Take by mouth.  . losartan-hydrochlorothiazide (HYZAAR) 50-12.5 MG tablet    Sig: Take 1 tablet by mouth daily.    Dispense:  90 tablet    Refill:  3    Order Specific Question:  Supervising Provider    Answer:  Pricilla Holm A [id number]  Follow-up: Return if symptoms worsen or fail to improve.  Mauricio Po, FNP

## 2015-10-20 NOTE — Assessment & Plan Note (Signed)
Hypertension remains above goal 140/90 with patient not taking her medication currently secondary to misplacement of her prescription. Refill losartan-hydrochlorothiazide. Encouraged to continue to monitor blood pressure at home as able. Decrease sodium in diet. Recheck blood pressure next office visit.

## 2015-10-20 NOTE — Assessment & Plan Note (Signed)
Continues to experience associated symptom of neck discomfort where she previously was treated for inflammation with multiple rounds of prednisone anti-inflammatories. Exam today is benign with no specific findings. Consider referral to dermatology or ear nose and throat.

## 2015-11-23 ENCOUNTER — Ambulatory Visit: Payer: BLUE CROSS/BLUE SHIELD | Admitting: Neurology

## 2015-12-12 ENCOUNTER — Encounter (HOSPITAL_COMMUNITY): Payer: Self-pay | Admitting: Emergency Medicine

## 2015-12-12 ENCOUNTER — Emergency Department (HOSPITAL_COMMUNITY): Payer: BLUE CROSS/BLUE SHIELD

## 2015-12-12 DIAGNOSIS — E119 Type 2 diabetes mellitus without complications: Secondary | ICD-10-CM | POA: Diagnosis not present

## 2015-12-12 DIAGNOSIS — Z5321 Procedure and treatment not carried out due to patient leaving prior to being seen by health care provider: Secondary | ICD-10-CM | POA: Insufficient documentation

## 2015-12-12 DIAGNOSIS — Z7982 Long term (current) use of aspirin: Secondary | ICD-10-CM

## 2015-12-12 DIAGNOSIS — E039 Hypothyroidism, unspecified: Secondary | ICD-10-CM | POA: Diagnosis not present

## 2015-12-12 DIAGNOSIS — R079 Chest pain, unspecified: Secondary | ICD-10-CM | POA: Insufficient documentation

## 2015-12-12 DIAGNOSIS — Z79899 Other long term (current) drug therapy: Secondary | ICD-10-CM | POA: Diagnosis not present

## 2015-12-12 DIAGNOSIS — I10 Essential (primary) hypertension: Secondary | ICD-10-CM

## 2015-12-13 ENCOUNTER — Emergency Department (HOSPITAL_COMMUNITY)
Admission: EM | Admit: 2015-12-13 | Discharge: 2015-12-13 | Discharge status: Absent without leave | Payer: BLUE CROSS/BLUE SHIELD | Attending: Emergency Medicine | Admitting: Emergency Medicine

## 2015-12-13 ENCOUNTER — Encounter (HOSPITAL_COMMUNITY): Payer: Self-pay

## 2015-12-13 ENCOUNTER — Emergency Department (HOSPITAL_COMMUNITY)
Admission: EM | Admit: 2015-12-13 | Discharge: 2015-12-13 | Disposition: A | Discharge status: Home / Self Care | Payer: BLUE CROSS/BLUE SHIELD | Attending: Emergency Medicine | Admitting: Emergency Medicine

## 2015-12-13 ENCOUNTER — Emergency Department (HOSPITAL_COMMUNITY)
Admission: EM | Admit: 2015-12-13 | Discharge: 2015-12-13 | Disposition: A | Discharge status: Home / Self Care | Payer: BLUE CROSS/BLUE SHIELD | Source: Home / Self Care

## 2015-12-13 ENCOUNTER — Telehealth: Payer: Self-pay | Admitting: Family

## 2015-12-13 ENCOUNTER — Ambulatory Visit: Payer: BLUE CROSS/BLUE SHIELD | Admitting: Adult Health

## 2015-12-13 DIAGNOSIS — Z79899 Other long term (current) drug therapy: Secondary | ICD-10-CM | POA: Insufficient documentation

## 2015-12-13 DIAGNOSIS — E119 Type 2 diabetes mellitus without complications: Secondary | ICD-10-CM | POA: Insufficient documentation

## 2015-12-13 DIAGNOSIS — E039 Hypothyroidism, unspecified: Secondary | ICD-10-CM | POA: Insufficient documentation

## 2015-12-13 DIAGNOSIS — Z7982 Long term (current) use of aspirin: Secondary | ICD-10-CM | POA: Insufficient documentation

## 2015-12-13 DIAGNOSIS — I10 Essential (primary) hypertension: Secondary | ICD-10-CM | POA: Insufficient documentation

## 2015-12-13 DIAGNOSIS — R079 Chest pain, unspecified: Secondary | ICD-10-CM

## 2015-12-13 HISTORY — DX: Essential (primary) hypertension: I10

## 2015-12-13 LAB — CBC
HEMATOCRIT: 44.8 % (ref 36.0–46.0)
HEMATOCRIT: 42.2 % (ref 36.0–46.0)
HEMOGLOBIN: 13.9 g/dL (ref 12.0–15.0)
Hemoglobin: 14.9 g/dL (ref 12.0–15.0)
MCH: 28.8 pg (ref 26.0–34.0)
MCH: 29.5 pg (ref 26.0–34.0)
MCHC: 32.9 g/dL (ref 30.0–36.0)
MCHC: 33.3 g/dL (ref 30.0–36.0)
MCV: 87.6 fL (ref 78.0–100.0)
MCV: 88.7 fL (ref 78.0–100.0)
Platelets: 217 10*3/uL (ref 150–400)
Platelets: 234 10*3/uL (ref 150–400)
RBC: 5.05 MIL/uL (ref 3.87–5.11)
RBC: 4.82 MIL/uL (ref 3.87–5.11)
RDW: 12.2 % (ref 11.5–15.5)
RDW: 12.1 % (ref 11.5–15.5)
WBC: 8.5 10*3/uL (ref 4.0–10.5)
WBC: 7.8 10*3/uL (ref 4.0–10.5)

## 2015-12-13 LAB — BASIC METABOLIC PANEL
ANION GAP: 9 (ref 5–15)
ANION GAP: 7 (ref 5–15)
BUN: 11 mg/dL (ref 6–20)
BUN: 9 mg/dL (ref 6–20)
CALCIUM: 9.7 mg/dL (ref 8.9–10.3)
CHLORIDE: 101 mmol/L (ref 101–111)
CO2: 22 mmol/L (ref 22–32)
CO2: 27 mmol/L (ref 22–32)
Calcium: 9.4 mg/dL (ref 8.9–10.3)
Chloride: 105 mmol/L (ref 101–111)
Creatinine, Ser: 0.76 mg/dL (ref 0.44–1.00)
Creatinine, Ser: 0.87 mg/dL (ref 0.44–1.00)
GFR calc Af Amer: >60 mL/min (ref >60)
Glucose, Bld: 100 mg/dL — ABNORMAL HIGH (ref 65–99)
Glucose, Bld: 142 mg/dL — ABNORMAL HIGH (ref 65–99)
POTASSIUM: 3.7 mmol/L (ref 3.5–5.1)
POTASSIUM: 4 mmol/L (ref 3.5–5.1)
SODIUM: 136 mmol/L (ref 135–145)
SODIUM: 135 mmol/L (ref 135–145)

## 2015-12-13 LAB — HEPATIC FUNCTION PANEL
ALBUMIN: 4.3 g/dL (ref 3.5–5.0)
ALK PHOS: 118 U/L (ref 38–126)
ALT: 26 U/L (ref 14–54)
AST: 21 U/L (ref 15–41)
Bilirubin, Direct: <0.1 mg/dL — ABNORMAL LOW (ref 0.1–0.5)
TOTAL PROTEIN: 8.6 g/dL — AB (ref 6.5–8.1)
Total Bilirubin: 0.6 mg/dL (ref 0.3–1.2)

## 2015-12-13 LAB — I-STAT TROPONIN, ED: Troponin i, poc: 0.01 ng/mL (ref 0.00–0.08)

## 2015-12-13 LAB — LIPASE, BLOOD: LIPASE: 45 U/L (ref 11–51)

## 2015-12-13 MED ORDER — IBUPROFEN 200 MG PO TABS
600.0000 mg | ORAL_TABLET | Freq: Once | ORAL | Status: AC
  Administered 2015-12-13: 600 mg via ORAL
  Filled 2015-12-13: qty 3

## 2015-12-13 NOTE — Telephone Encounter (Signed)
Spoke with pt and advise her to go to ER due to having chest pain and no openings within the office. Pt understood and states she will go to ER.

## 2015-12-13 NOTE — Discharge Instructions (Signed)
You have been seen today for chest discomfort. Your imaging and lab tests showed no abnormalities. Follow up with PCP as soon as possible for reevaluation and chronic management. Return to ED should symptoms worsen.

## 2015-12-13 NOTE — ED Provider Notes (Signed)
Complains of anterior chest pain constant for several weeks. Nothing makes symptoms better or worse. Symptoms not affected by eating or with exertion. She had a few seconds of nausea today. She presents today as pain has been worse over the past few days. She's had no treatment for the pain. She came to the emergency department last night, left the force being seen by the physician. I examined no distress lungs clear auscultation heart regular rate and rhythm no murmurs abdomen nondistended nontender extremities without edema. Heart score equals 2 based on age, risk factors, EKG and troponin. Cardiac risk factors include family history and hypertension Chest x-ray viewed by me   Orlie Dakin, MD 12/13/15 2017

## 2015-12-13 NOTE — ED Notes (Signed)
Mini lab contacted about I stat troponin that was collected at 1535. Lab reports negative results and will redock to get results to come over in the computer.

## 2015-12-13 NOTE — Telephone Encounter (Signed)
Patient Name: April Gonzalez DOB: November 19, 1960 Initial Comment Caller states she's having chest pains. She was seen in the ER last night, still having chest pains. Nurse Assessment Nurse: Vallery Sa, RN, Cathy Date/Time (Eastern Time): 12/13/2015 11:46:43 AM Confirm and document reason for call. If symptomatic, describe symptoms. You must click the next button to save text entered. ---Caller states she was seen in the ER last night for chest pain and she continues to have chest pain (rated as a 8 on the 1 to 10 scale). No severe breathing difficulty. No injury recently. Has the patient traveled out of the country within the last 30 days? ---No Does the patient have any new or worsening symptoms? ---Yes Will a triage be completed? ---Yes Related visit to physician within the last 2 weeks? ---Yes Does the PT have any chronic conditions? (i.e. diabetes, asthma, etc.) ---Yes List chronic conditions. ---High Blood Pressure, Thyroid problems, Heart Valve leak Is the patient pregnant or possibly pregnant? (Ask all females between the ages of 43-55) ---No Is this a behavioral health or substance abuse call? ---No Guidelines Guideline Title Affirmed Question Affirmed Notes Chest Pain [1] Chest pain lasts > 5 minutes AND [2] history of heart disease (i.e., heart attack, bypass surgery, angina, angioplasty, CHF; not just a heart murmur) Final Disposition User Call EMS 911 Now Vallery Sa, RN, [employer] declined the Call 911 disposition. Reinforced the Call 911 disposition. She states she was seen in the ER last night and advised to follow up with her MD today. Transferred caller to [facility] via the office backline. Referrals GO TO FACILITY REFUSED Disagree/Comply: Disagree Disagree/Comply Reason: Disagree with instructions

## 2015-12-13 NOTE — ED Notes (Addendum)
Results for istat troponin give to April joy pa-c with value of 0.00-results still not crossing over

## 2015-12-13 NOTE — ED Provider Notes (Signed)
[facility] DEPT Provider Note   CSN: [id number] Arrival date & time: 12/13/15  1524  First Provider Contact:  First MD Initiated Contact with Patient 12/13/15 1822        History   Chief Complaint Chief Complaint  Patient presents with  . Chest Pain    HPI April Gonzalez is a 55 y.o. female.  HPI   April Gonzalez is a 55 y.o. female, with a history of HTN and an unknown heart murmur, presenting to the ED with chest pain for the last two weeks. Pain is moderate to severe, constant, centered around the sternum, squeezing, radiates down to the base of the sternum and around the right side to the right lower back. Pt states she has had this pain before, but no abnormalities were found. Tried to get in to see her PCP, but was told they couldn't see her due to the nature of the complaint. Endorses occasional nausea. Pt denies fever/chills, cough, vomiting, or any other complaints.     Past Medical History:  Diagnosis Date  . Anemia 05/2012   transfusion  . Fibroids    H/O myomectomy  . H/O uterine prolapse   . Heart murmur   . Heart valve problem    Blockage  . History of blood transfusion   . Hypertension   . RA (rheumatoid arthritis) (HCC)    states was dx at age 50 but not on meds  . Seizures ([place])    last seizure age 77 or 10 per pt report  . Thyroid disorder     Patient Active Problem List   Diagnosis Date Noted  . Osteoarthritis 10/20/2015  . Neck swelling 10/20/2015  . Hypothyroidism 06/28/2015  . Essential hypertension 06/28/2015  . Iron deficiency 06/28/2015  . Sinus pressure 06/28/2015  . Faintness 04/28/2015  . Gustatory hallucinations 04/28/2015  . Hyperlipidemia 01/17/2015  . Goiter 01/06/2015  . Low vitamin B12 level 01/06/2015  . Abdominal pain 07/14/2014  . Hematochezia 07/14/2014  . Rectal bleeding 06/21/2014  . Numbness and tingling of foot 06/21/2014  . Urinary incontinence 11/02/2012  . Dense breasts 07/13/2012  . Diabetes mellitus without  complication ([place])   . Seizures ([place])   . RA (rheumatoid arthritis) ([place])   . Fibroids   . HYPERTHYROIDISM 01/06/2007  . Iron deficiency anemia 01/06/2007  . HEART MURMUR, HX OF 12/04/2006    Past Surgical History:  Procedure Laterality Date  . ANKLE SURGERY     One plate, seven screws in the right ankle   . BLADDER SUSPENSION N/A 12/10/2012   Procedure: TRANSVAGINAL TAPE (TVT) PROCEDURE;  Surgeon: Delice Lesch, MD;  Location: [facility];  Service: Gynecology;  Laterality: N/A;  . CYSTOCELE REPAIR N/A 12/10/2012   Procedure: ANTERIOR REPAIR (CYSTOCELE);  Surgeon: Eldred Manges, MD;  Location: [facility];  Service: Gynecology;  Laterality: N/A;  . CYSTOSCOPY N/A 12/10/2012   Procedure: CYSTOSCOPY;  Surgeon: Delice Lesch, MD;  Location: [facility];  Service: Gynecology;  Laterality: N/A;  . EXCISION VAGINAL CYST    . MYOMECTOMY  44YRS AGO   HYSTEROSCOPIC  . TUBAL LIGATION  10/10/1991  . VAGINAL HYSTERECTOMY N/A 12/10/2012   Procedure: Total Vaginal Hysterectomy;  Surgeon: Eldred Manges, MD;  Location: [facility];  Service: Gynecology;  Laterality: N/A;    OB History    Gravida Para Term Preterm AB Living   2 2 2     2    SAB TAB Ectopic Multiple Live Births  Home Medications    Prior to Admission medications   Medication Sig Start Date End Date Taking? Authorizing Provider  aspirin EC 81 MG tablet Take 81 mg by mouth daily as needed (for anti-platelet therapy). Reported on 09/18/2015 01/17/15  Yes Hendricks Limes, MD  cetirizine (ZYRTEC ALLERGY) 10 MG tablet Take 1 tablet (10 mg total) by mouth daily. Patient taking differently: Take 10 mg by mouth daily as needed for allergies.  09/18/15  Yes Debbrah Alar, NP  EPINEPHrine (EPIPEN 2-PAK) 0.3 mg/0.3 mL IJ SOAJ injection Inject 0.3 mLs (0.3 mg total) into the muscle once. If you develop difficulty breathing 09/18/15  Yes Debbrah Alar, NP  ibuprofen (ADVIL,MOTRIN) 200 MG tablet Take 800 mg by mouth daily as  needed for moderate pain.   Yes Historical Provider, MD  levothyroxine (SYNTHROID, LEVOTHROID) 25 MCG tablet Take 25 mcg by mouth daily before breakfast.  04/21/15  Yes Historical Provider, MD  losartan-hydrochlorothiazide (HYZAAR) 50-12.5 MG tablet Take 1 tablet by mouth daily. 10/20/15  Yes Golden Circle, FNP  triamcinolone cream (KENALOG) 0.1 % Apply 1 application topically daily as needed. 09/08/15  Yes Historical Provider, MD    Family History Family History  Problem Relation Age of Onset  . Heart failure Father   . Diabetes Mother   . High blood pressure Mother   . Arthritis Mother   . Hypertension Mother   . Breast cancer Maternal Aunt     in her 33's   . Breast cancer Cousin   . Thyroid disease Sister     Social History Social History  Substance Use Topics  . Smoking status: Never Smoker  . Smokeless tobacco: Never Used  . Alcohol use 0.0 oz/week     Comment: rare     Allergies   Methimazole; Nitroglycerin; Other; Tetracycline hcl; Shrimp [shellfish allergy]; Penicillins; and Terbutaline   Review of Systems Review of Systems  Constitutional: Negative for chills, diaphoresis and fever.  Respiratory: Negative for cough, chest tightness and shortness of breath.   Cardiovascular: Positive for chest pain. Negative for palpitations and leg swelling.  Gastrointestinal: Negative for abdominal pain, constipation, diarrhea, nausea and vomiting.  Genitourinary: Negative for dysuria and flank pain.  Musculoskeletal: Negative for back pain.  Skin: Negative for color change and pallor.  Neurological: Negative for dizziness, syncope, weakness and light-headedness.  All other systems reviewed and are negative.    Physical Exam Updated Vital Signs BP 128/76 (BP Location: Right Arm)   Pulse 80   Temp 98.6 F (37 C) (Oral)   Resp 23   LMP 12/03/2012   SpO2 96%   Physical Exam  Constitutional: She appears well-developed and well-nourished. No distress.  HENT:  Head:  Normocephalic and atraumatic.  Eyes: Conjunctivae are normal.  Neck: Neck supple.  Cardiovascular: Normal rate, regular rhythm, normal heart sounds and intact distal pulses.   Pulmonary/Chest: Effort normal and breath sounds normal. No respiratory distress.  Abdominal: Soft. There is no tenderness. There is no guarding, no CVA tenderness and negative Murphy's sign.  Musculoskeletal: She exhibits no edema or tenderness.  Lymphadenopathy:    She has no cervical adenopathy.  Neurological: She is alert.  Skin: Skin is warm and dry. She is not diaphoretic.  Psychiatric: She has a normal mood and affect. Her behavior is normal.  Nursing note and vitals reviewed.    ED Treatments / Results  Labs (all labs ordered are listed, but only abnormal results are displayed) Labs Reviewed  BASIC METABOLIC PANEL - Abnormal;  Notable for the following:       Result Value   Glucose, Bld 142 (*)    All other components within normal limits  HEPATIC FUNCTION PANEL - Abnormal; Notable for the following:    Total Protein 8.6 (*)    Bilirubin, Direct <0.1 (*)    All other components within normal limits  CBC  LIPASE, BLOOD  I-STAT TROPOININ, ED    EKG  EKG Interpretation  Date/Time:  Wednesday December 13 2015 15:34:42 EDT Ventricular Rate:  90 PR Interval:  158 QRS Duration: 70 QT Interval:  356 QTC Calculation: 435 R Axis:   35 Text Interpretation:  Normal sinus rhythm Nonspecific T wave abnormality Abnormal ECG No significant change since last tracing Confirmed by Winfred Leeds  MD, SAM [id number]) on 12/13/2015 6:19:03 PM       Radiology Dg Chest 2 View  Result Date: 12/12/2015 CLINICAL DATA:  Mid chest pain for 2 weeks, worsened over the past 3 days. EXAM: CHEST  2 VIEW COMPARISON:  10/29/2014 FINDINGS: Subsegmental atelectasis or scarring at both lung bases. The cardiomediastinal contours are normal. The lungs are clear. Pulmonary vasculature is normal. No consolidation, pleural effusion, or  pneumothorax. No acute osseous abnormalities are seen. IMPRESSION: No acute abnormality. Subsegmental atelectasis or scarring, similar to prior. Electronically Signed   By: Jeb Levering M.D.   On: 12/12/2015 23:17    Procedures Procedures (including critical care time)  Medications Ordered in ED Medications  ibuprofen (ADVIL,MOTRIN) tablet 600 mg (600 mg Oral Given 12/13/15 1938)     Initial Impression / Assessment and Plan / ED Course  I have reviewed the triage vital signs and the nursing notes.  Pertinent labs & imaging results that were available during my care of the patient were reviewed by me and considered in my medical decision making (see chart for details).  Clinical Course    April Gonzalez presents with chest pain, constant for the last 2 weeks.  Findings and plan of care discussed with Orlie Dakin, MD. Dr. Winfred Leeds personally evaluated and examined this patient.  Left before being seen last night. Troponin last night negative. Troponin today negative (value did not crossover from the lab, but the printed result shows 0.00 ng/mL.) No abnormalities on exam. Do not suspect ACS. Further labs obtained today were also negative. Patient to follow up with PCP on this matter. The patient was given instructions for home care as well as return precautions. Patient voices understanding of these instructions, accepts the plan, and is comfortable with discharge.  Vitals:   12/13/15 1803 12/13/15 1830 12/13/15 1900 12/13/15 1930  BP: 128/76 128/84 130/84 119/73  Pulse: 80 79 64 70  Resp: 23 25 16 19   Temp:      TempSrc:      SpO2: 96% 99% 97% 96%   Vitals:   12/13/15 1900 12/13/15 1930 12/13/15 2000 12/13/15 2045  BP: 130/84 119/73 143/91 151/96  Pulse: 64 70 93 92  Resp: 16 19 15 15   Temp:      TempSrc:      SpO2: 97% 96% 96% 96%     Final Clinical Impressions(s) / ED Diagnoses   Final diagnoses:  Chest pain, unspecified chest pain type    New  Prescriptions Discharge Medication List as of 12/13/2015  8:25 PM       Lorayne Bender, PA-C 12/13/15 2356    Orlie Dakin, MD 12/14/15 [id number]

## 2015-12-13 NOTE — ED Triage Notes (Signed)
Per Pt, Pt is coming from with squeezing central chest pain that started over a week ago, but has been worsening for the past three days. Pt reports diarrhea and some fatigue.

## 2015-12-14 ENCOUNTER — Telehealth: Payer: Self-pay | Admitting: Family

## 2015-12-14 NOTE — Telephone Encounter (Signed)
Pt would like to transfer to the [facility] pt stating it is closer to her.  Is it okay to transfer from [facility] to [facility]?

## 2015-12-14 NOTE — Telephone Encounter (Signed)
Ok with me 

## 2015-12-15 NOTE — Telephone Encounter (Signed)
lmom of the msg from both NP and she is able to schedule appointment here.

## 2015-12-20 ENCOUNTER — Ambulatory Visit (INDEPENDENT_AMBULATORY_CARE_PROVIDER_SITE_OTHER): Payer: BLUE CROSS/BLUE SHIELD | Admitting: Neurology

## 2015-12-20 ENCOUNTER — Encounter: Payer: Self-pay | Admitting: Neurology

## 2015-12-20 VITALS — BP 118/78 | HR 69 | Temp 97.8°F | Ht 63.0 in | Wt 192.0 lb

## 2015-12-20 DIAGNOSIS — R55 Syncope and collapse: Secondary | ICD-10-CM | POA: Diagnosis not present
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
## 2015-12-20 NOTE — Patient Instructions (Addendum)
You look great!  Continue with good hydration. Follow-up on an as needed basis, call our office for any changes.

## 2015-12-21 ENCOUNTER — Encounter: Payer: Self-pay | Admitting: Adult Health

## 2015-12-21 ENCOUNTER — Ambulatory Visit (INDEPENDENT_AMBULATORY_CARE_PROVIDER_SITE_OTHER): Payer: BLUE CROSS/BLUE SHIELD | Admitting: Adult Health

## 2015-12-21 VITALS — BP 102/68 | Temp 98.6°F | Ht 63.0 in | Wt 193.2 lb

## 2015-12-21 DIAGNOSIS — Z7689 Persons encountering health services in other specified circumstances: Secondary | ICD-10-CM

## 2015-12-21 DIAGNOSIS — I10 Essential (primary) hypertension: Secondary | ICD-10-CM | POA: Diagnosis not present

## 2015-12-21 DIAGNOSIS — E2839 Other primary ovarian failure: Secondary | ICD-10-CM

## 2015-12-21 DIAGNOSIS — Z1211 Encounter for screening for malignant neoplasm of colon: Secondary | ICD-10-CM

## 2015-12-21 DIAGNOSIS — M5412 Radiculopathy, cervical region: Secondary | ICD-10-CM | POA: Diagnosis not present

## 2015-12-21 DIAGNOSIS — Z7189 Other specified counseling: Secondary | ICD-10-CM | POA: Diagnosis not present

## 2015-12-21 MED ORDER — METHYLPREDNISOLONE 4 MG PO TBPK: ORAL_TABLET | ORAL | 0 refills | Status: DC

## 2015-12-21 MED ORDER — CYCLOBENZAPRINE HCL 10 MG PO TABS: 10.0000 mg | ORAL_TABLET | Freq: Three times a day (TID) | ORAL | 0 refills | Status: DC | PRN

## 2015-12-21 NOTE — Patient Instructions (Signed)
It was great meeting you today   Please schedule your physical in November  Someone will call you to schedule your colonoscopy   Please call the breast center to schedule your mammogram  I have sent in Prednisone and Flexeril for your shoulder pain. Let me know if this does not help

## 2015-12-21 NOTE — Progress Notes (Signed)
Patient presents to clinic today to establish care. She is a pleasant 55 year old AA female who  has a past medical history of Anemia (05/2012); Fibroids; H/O uterine prolapse; Heart murmur; Heart valve problem; History of blood transfusion; Hypertension; RA (rheumatoid arthritis) ([place]); Seizures ([place]); and Thyroid disorder.  Her last physical was in November 2016  Acute Concerns: Establish Care   Right shoulder pain - She reports that over the last 3 weeks she has had right shoulder pain that had at one time radiated into her chest. She was seen in the ER for this about 10 days ago. EKG, Chest XR and labs were all negative. Today she reports that the pain is just in her right shoulder. The pain is described as a " sharp stabbing pain." More painful to touch than with ROM.  She does have radiating pain on occasion down her right arm. No loss of grip strength  Chronic Issues: Hypothyroidism  - feels as though she is well controlled on her current dose of synthroid  Hypertension  - She feels as though this is well controlled on her current dose of Hyzaar  Health Maintenance: Dental -- Does not do routine care [facility] -- Does routine care  Immunizations --UTD  Colonoscopy -- Never had  Mammogram -- 2014  PAP -- No longer needs  Bone Density -- Never had   Is followed by Neurology - Dr Delice Lesch    Past Medical History:  Diagnosis Date  . Anemia 05/2012   transfusion  . Fibroids    H/O myomectomy  . H/O uterine prolapse   . Heart murmur   . Heart valve problem    Blockage  . History of blood transfusion   . Hypertension   . RA (rheumatoid arthritis) ([facility])    states was dx at age 36 but not on meds  . Seizures ([place])    last seizure age 45 or 10 per pt report  . Thyroid disorder     Past Surgical History:  Procedure Laterality Date  . ANKLE SURGERY     One plate, seven screws in the right ankle   . BLADDER SUSPENSION N/A 12/10/2012   Procedure: TRANSVAGINAL TAPE (TVT)  PROCEDURE;  Surgeon: Delice Lesch, MD;  Location: [facility];  Service: Gynecology;  Laterality: N/A;  . CYSTOCELE REPAIR N/A 12/10/2012   Procedure: ANTERIOR REPAIR (CYSTOCELE);  Surgeon: Eldred Manges, MD;  Location: [facility];  Service: Gynecology;  Laterality: N/A;  . CYSTOSCOPY N/A 12/10/2012   Procedure: CYSTOSCOPY;  Surgeon: Delice Lesch, MD;  Location: [facility];  Service: Gynecology;  Laterality: N/A;  . EXCISION VAGINAL CYST    . MYOMECTOMY  35YRS AGO   HYSTEROSCOPIC  . TUBAL LIGATION  10/10/1991  . VAGINAL HYSTERECTOMY N/A 12/10/2012   Procedure: Total Vaginal Hysterectomy;  Surgeon: Eldred Manges, MD;  Location: [facility];  Service: Gynecology;  Laterality: N/A;    Current Outpatient Prescriptions on File Prior to Visit  Medication Sig Dispense Refill  . aspirin EC 81 MG tablet Take 81 mg by mouth daily as needed (for anti-platelet therapy). Reported on 09/18/2015    . cetirizine (ZYRTEC ALLERGY) 10 MG tablet Take 1 tablet (10 mg total) by mouth daily. (Patient taking differently: Take 10 mg by mouth daily as needed for allergies. ) 30 tablet 0  . EPINEPHrine (EPIPEN 2-PAK) 0.3 mg/0.3 mL IJ SOAJ injection Inject 0.3 mLs (0.3 mg total) into the muscle once. If you develop difficulty breathing 2  Device 0  . ibuprofen (ADVIL,MOTRIN) 200 MG tablet Take 800 mg by mouth daily as needed for moderate pain.    Marland Kitchen levothyroxine (SYNTHROID, LEVOTHROID) 25 MCG tablet Take 25 mcg by mouth daily before breakfast.   2  . losartan-hydrochlorothiazide (HYZAAR) 50-12.5 MG tablet Take 1 tablet by mouth daily. 90 tablet 3  . triamcinolone cream (KENALOG) 0.1 % Apply 1 application topically daily as needed.  0   No current facility-administered medications on file prior to visit.     Allergies  Allergen Reactions  . Methimazole Anaphylaxis  . Nitroglycerin Other (See Comments)    sweating  . Other Other (See Comments)    Seasonal / dog hair   . Tetracycline Hcl Other (See Comments)    REACTION:  elevated blood pressure  . Shrimp [Shellfish Allergy] Itching  . Penicillins Other (See Comments)    Has patient had a PCN reaction causing immediate rash, facial/tongue/throat swelling, SOB or lightheadedness with hypotension:  Unknown 2 years ago Has patient had a PCN reaction causing severe rash involving mucus membranes or skin necrosis: No Has patient had a PCN reaction that required hospitalization No Has patient had a PCN reaction occurring within the last 10 years: Yes If all of the above answers are "NO", then may proceed with Cephalosporin use.   . Terbutaline Other (See Comments)    Family History  Problem Relation Age of Onset  . Heart failure Father   . Diabetes Mother   . High blood pressure Mother   . Arthritis Mother   . Hypertension Mother   . Breast cancer Maternal Aunt     in her 46's   . Breast cancer Cousin   . Thyroid disease Sister     Social History   Social History  . Marital status: Married    Spouse name: N/A  . Number of children: 2  . Years of education: 15   Occupational History  . Not on file.   Social History Main Topics  . Smoking status: Never Smoker  . Smokeless tobacco: Never Used  . Alcohol use 0.0 oz/week     Comment: rare  . Drug use: No  . Sexual activity: Yes    Birth control/ protection: Surgical     Comment: BTL    Other Topics Concern  . Not on file   Social History Narrative   Regular exercise-no   Caffeine Use-yes   Lives with husband and brother in law in one story home.  Does not work.  Full time student.  Has 2 children.    Review of Systems  Constitutional: Negative.   HENT: Negative.   Eyes: Negative.   Respiratory: Negative.   Cardiovascular: Negative.   Gastrointestinal: Negative.   Genitourinary: Negative.   Musculoskeletal: Positive for back pain and myalgias. Negative for falls, joint pain and neck pain.  Skin: Negative.   Neurological: Negative.   Endo/Heme/Allergies: Negative.     Psychiatric/Behavioral: Negative.   All other systems reviewed and are negative.   BP 102/68   Temp 98.6 F (37 C) (Oral)   Ht 5\' 3"  (1.6 m)   Wt 193 lb 3.2 oz (87.6 kg)   LMP 12/03/2012   BMI 34.22 kg/m   Physical Exam  Constitutional: She is oriented to person, place, and time and well-developed, well-nourished, and in no distress. No distress.  HENT:  Head: Normocephalic and atraumatic.  Right Ear: External ear normal.  Left Ear: External ear normal.  Nose: Nose normal.  Mouth/Throat:  Oropharynx is clear and moist. No oropharyngeal exudate.  Neck: Normal range of motion. Neck supple. No thyromegaly present.  Cardiovascular: Normal rate, regular rhythm and intact distal pulses.   Murmur heard. Pulmonary/Chest: Effort normal and breath sounds normal. No respiratory distress. She has no wheezes.  Musculoskeletal: She exhibits tenderness. She exhibits no edema or deformity.       Right shoulder: She exhibits decreased range of motion (not able to raise right arm above half way point), tenderness and pain. She exhibits no swelling, no effusion, no deformity and normal strength.  No pain with cup full/cup empty test. Has pain with palpation to right scapula and right trapazoid  Lymphadenopathy:    She has no cervical adenopathy.  Neurological: She is alert and oriented to person, place, and time. She has normal reflexes. She displays normal reflexes. No cranial nerve deficit. She exhibits normal muscle tone. Gait normal. Coordination normal. GCS score is 15.  Skin: Skin is warm and dry. No rash noted. She is not diaphoretic. No erythema. No pallor.  Psychiatric: Mood, memory, affect and judgment normal.  Nursing note and vitals reviewed.  Assessment/Plan: 1. Encounter to establish care - Follow up in November for CPE - Follow up sooner if needed - Encouraged heart healthy diet and regular exercise  2. Colon cancer screening - Ambulatory referral to Gastroenterology  3.  Essential hypertension - Well controlled. No change in medictions  4. Cervical radiculopathy - Cannot r/o rotator cuff issue - methylPREDNISolone (MEDROL DOSEPAK) 4 MG TBPK tablet; Take as directed  Dispense: 21 tablet; Refill: 0 - cyclobenzaprine (FLEXERIL) 10 MG tablet; Take 1 tablet (10 mg total) by mouth 3 (three) times daily as needed for muscle spasms.  Dispense: 30 tablet; Refill: 0 - Heating pad - Motrin 600mg  Q8H PRN  - She will follow up with me in a one week. If continues to have pain will consider Sports medicine referral or MRI of shoulder  5. Estrogen deficiency - MM DIGITAL SCREENING BILATERAL; Future  [name], NP

## 2016-01-22 ENCOUNTER — Encounter: Payer: Self-pay | Admitting: Adult Health

## 2016-01-26 ENCOUNTER — Telehealth: Payer: Self-pay | Admitting: Adult Health

## 2016-01-26 ENCOUNTER — Ambulatory Visit: Payer: BLUE CROSS/BLUE SHIELD | Admitting: Adult Health

## 2016-01-26 ENCOUNTER — Other Ambulatory Visit: Payer: Self-pay | Admitting: Adult Health

## 2016-01-26 MED ORDER — LEVOTHYROXINE SODIUM 25 MCG PO TABS
25.0000 ug | ORAL_TABLET | Freq: Every day | ORAL | 6 refills | Status: DC
Start: 2016-01-26 — End: 2016-08-26

## 2016-01-26 NOTE — Telephone Encounter (Signed)
Pt missed her appt today. 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 Pt needs levothyroxine 25 mcg  #30 w/[facility]ills [facility] [phone]. Pt is out

## 2016-01-26 NOTE — Telephone Encounter (Signed)
Ok to refill 

## 2016-02-04 IMAGING — MR MR HEAD WO/W CM
13 series · 46 of 48 positions shown · IV contrast (multihance)
Comparison: 03/05/2015

CLINICAL DATA: Syncope.  Gustatory hallucinations.

EXAM:
MRI HEAD WITHOUT AND WITH CONTRAST
TECHNIQUE: Multiplanar, multiecho pulse sequences of the brain and surrounding
structures were obtained without and with intravenous contrast.
CONTRAST:  17mL MULTIHANCE GADOBENATE DIMEGLUMINE 529 MG/ML IV SOLN

[Series 2: t1_se_sag · sagittal · 5.0mm · 0.45mm/px · 2 of 21 slices shown]
[im 1/21]
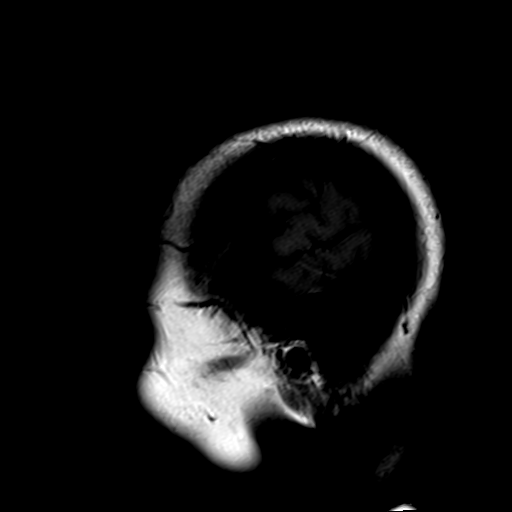
[im 21/21]
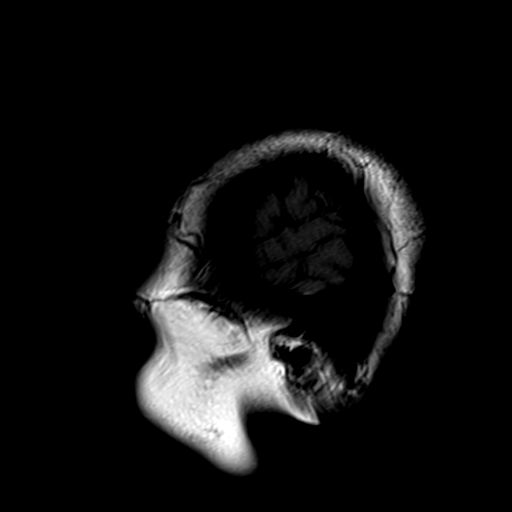

[Series 3: ep2d_diff_(id)_trace · axial · 3.0mm · 1.80mm/px · z∈[-68,+79]mm · 8 of 98 slices shown]
[im 1/98]
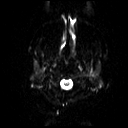
[im 14/98]
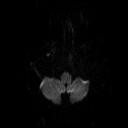
[im 28/98]
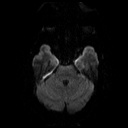
[im 42/98]
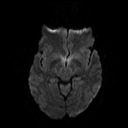
[im 56/98]
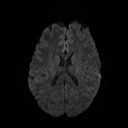
[im 70/98]
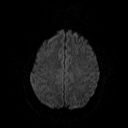
[im 84/98]
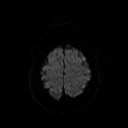
[im 98/98]
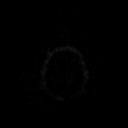

[Series 4: ep2d_diff_(id)_trace_adc · axial · 3.0mm · 1.80mm/px · z∈[-68,+79]mm · 4 of 50 slices shown]
[im 1/50]
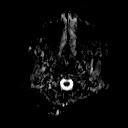
[im 17/50]
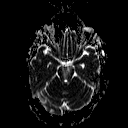
[im 33/50]
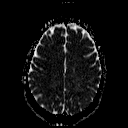
[im 50/50]
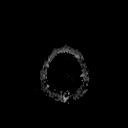

[Series 6: swi_images · axial · 2.0mm · 0.90mm/px · z∈[-79,+79]mm · 6 of 80 slices shown]
[im 1/80]
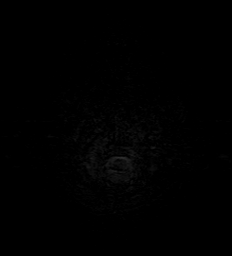
[im 16/80]
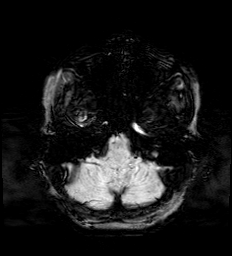
[im 32/80]
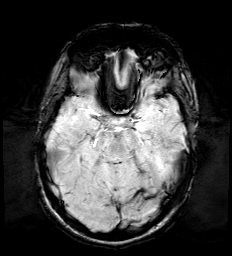
[im 48/80]
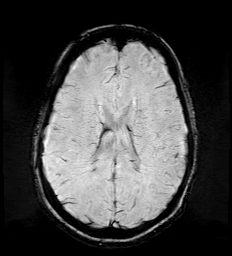
[im 64/80]
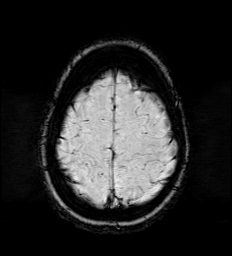
[im 80/80]
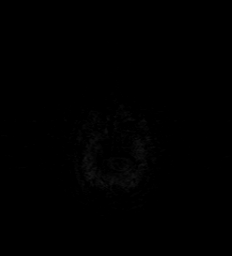

[Series 7: ep2d_diff_cor · coronal · 5.0mm · 1.77mm/px · 4 of 55 slices shown]
[im 1/55]
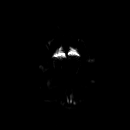
[im 19/55]
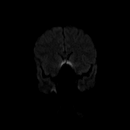
[im 37/55]
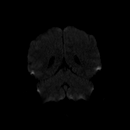
[im 55/55]
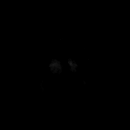

[Series 8: ep2d_diff_cor_adc · coronal · 5.0mm · 1.77mm/px · 2 of 28 slices shown]
[im 1/28]
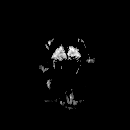
[im 28/28]
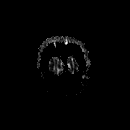

[Series 9: FLAIR · axial · 5.0mm · 0.45mm/px · z∈[-65,+72]mm · 2 of 23 slices shown]
[im 1/23]
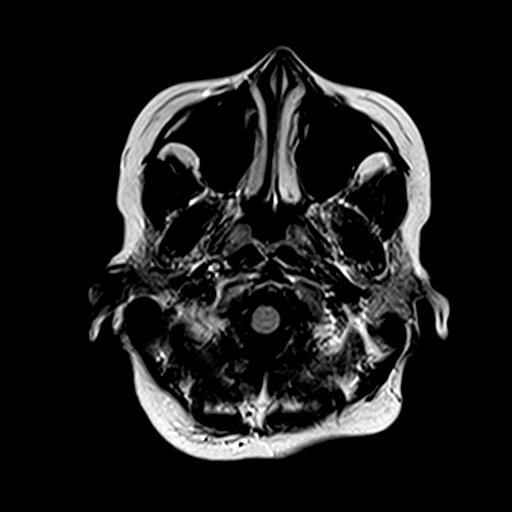
[im 23/23]
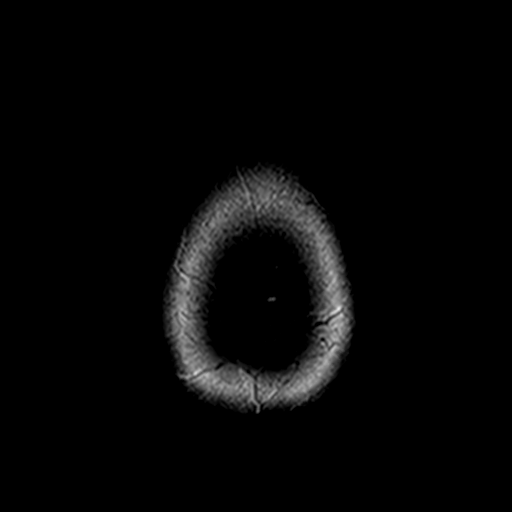

[Series 10: t2_tse_tra_512 · axial · 5.0mm · 0.60mm/px · z∈[-65,+72]mm · 2 of 23 slices shown]
[im 1/23]
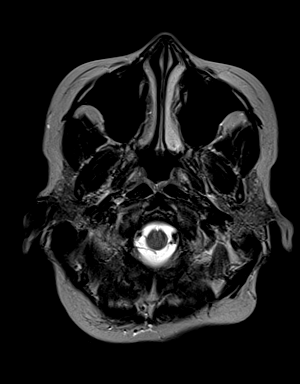
[im 23/23]
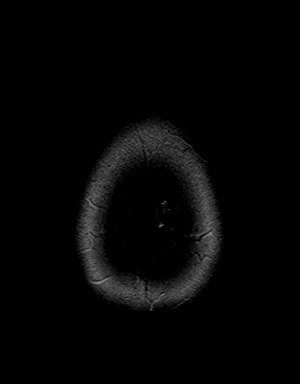

[Series 11: t1_mpr_tra · axial · 2.0mm · 0.45mm/px · z∈[-75,+82]mm · 6 of 80 slices shown]
[im 1/80]
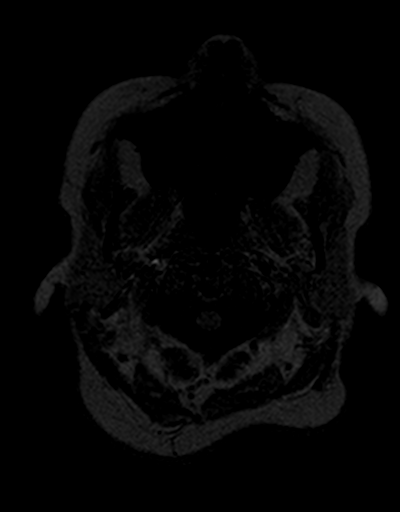
[im 16/80]
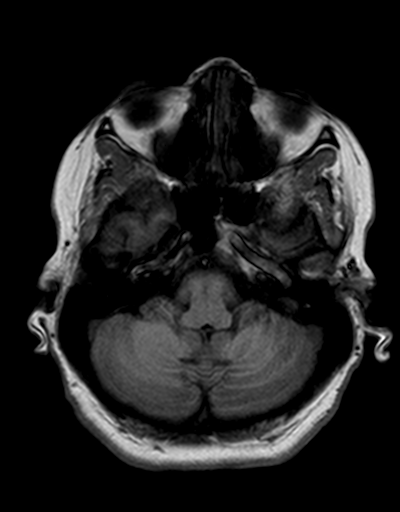
[im 32/80]
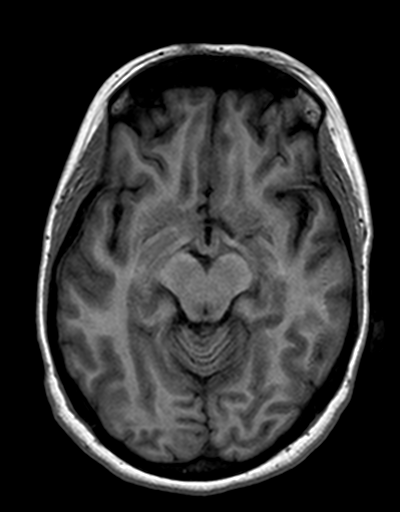
[im 48/80]
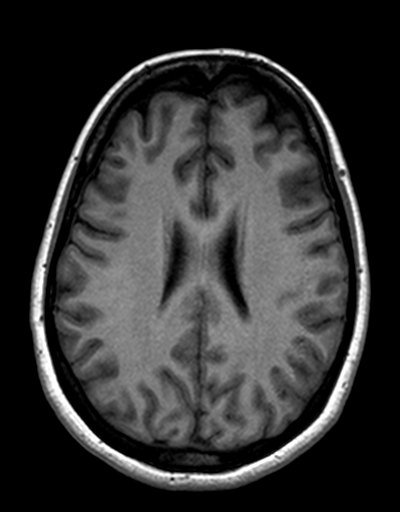
[im 64/80]
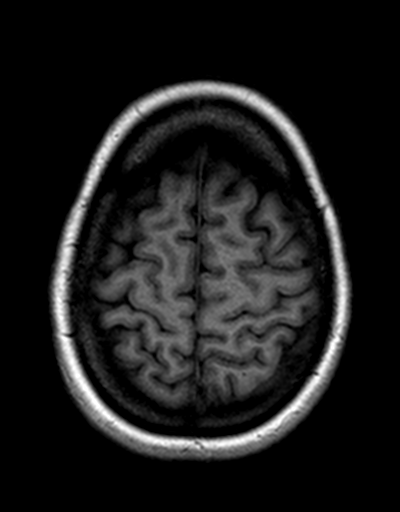
[im 80/80]
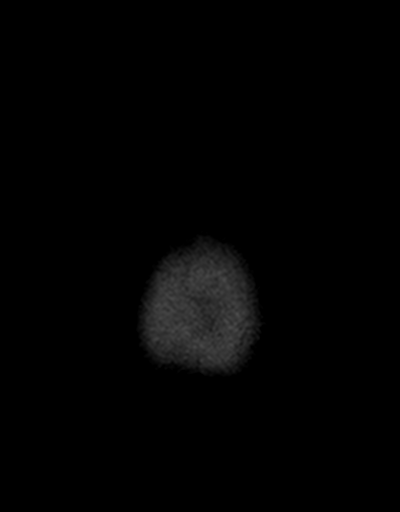

[Series 12: T2 · coronal · 3.0mm · 0.56mm/px · 2 of 27 slices shown (1 of 2)]
[im 1/27]
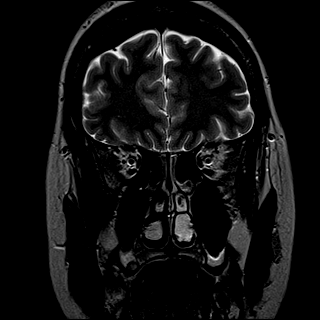
[im 27/27]
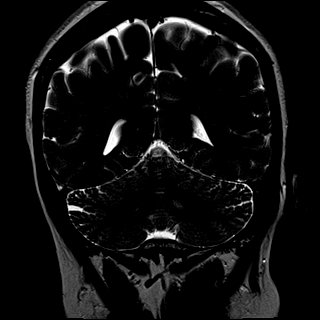

[Series 14: post cor · coronal · 5.0mm · 0.72mm/px · 2 of 28 slices shown]
[im 1/28]
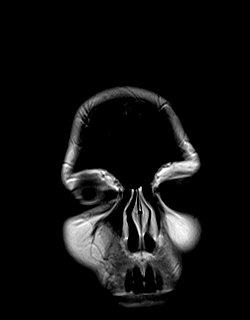
[im 28/28]
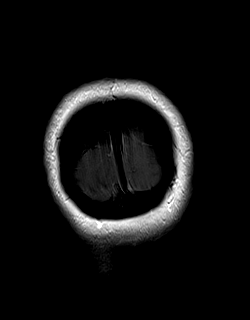

[Series 15: post t1_mpr_tra · axial · 2.0mm · 0.45mm/px · z∈[-75,+18]mm · 4 of 80 slices shown]
[im 1/80]
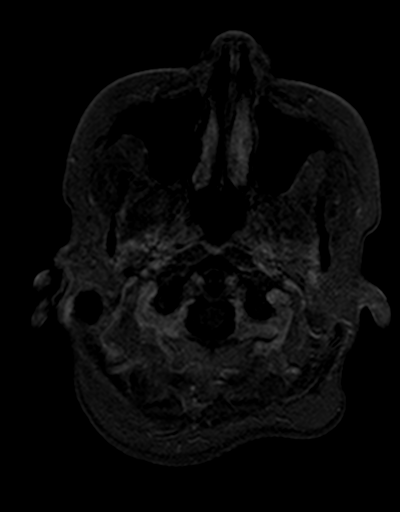
[im 16/80]
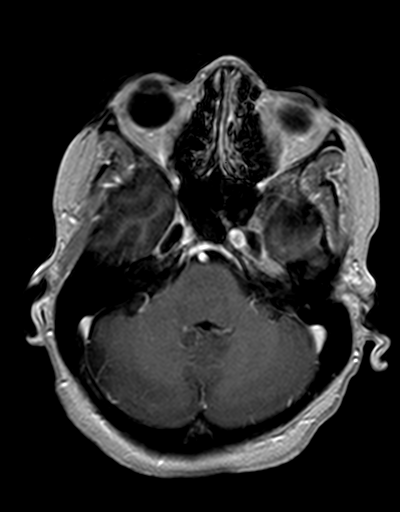
[im 32/80]
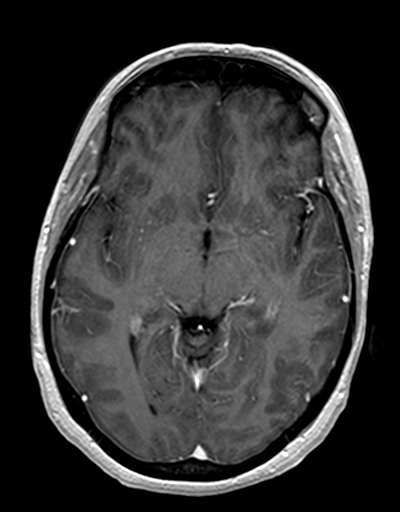
[im 48/80]
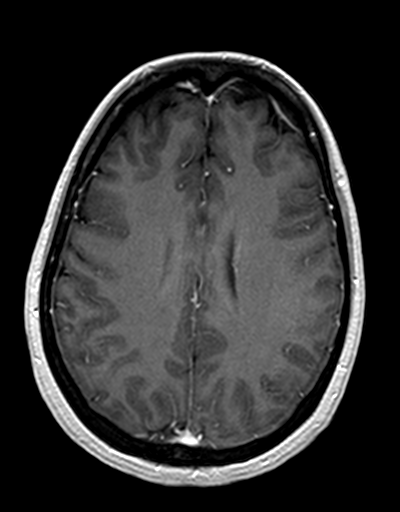

[Series 16: T2 · coronal · 5.0mm · 0.45mm/px · 2 of 28 slices shown (2 of 2)]
[im 1/28]
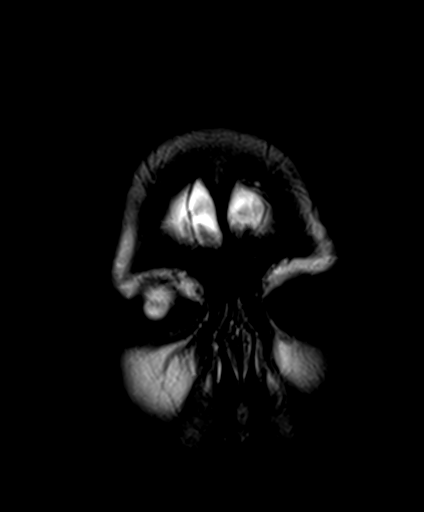
[im 28/28]
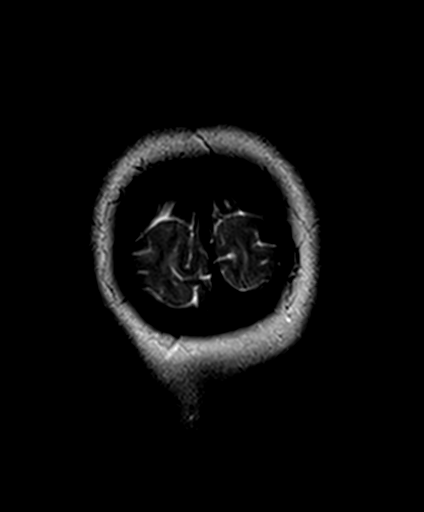

[46 of 48 positions shown; findings below may reference images not displayed]

FINDINGS: Ventricle size normal.  Cerebral volume normal.

Negative for acute infarct. 5 mm hyperintensity left frontal white
matter. Remaining white matter normal. Basal ganglia and brainstem
normal.

Negative for intracranial hemorrhage.

Negative for mass or edema

Medial temporal lobe normal in signal and volume. Negative for
mesial temporal sclerosis.

Postcontrast imaging reveals normal enhancement. No enhancing mass
lesion. Vascular enhancement normal.

Paranasal sinuses clear.  Pituitary normal in size.  Normal orbit.
IMPRESSION: Solitary small left frontal white matter hyperintensity. Otherwise
within normal limits.

## 2016-02-08 ENCOUNTER — Encounter: Payer: Self-pay | Admitting: Adult Health

## 2016-02-08 ENCOUNTER — Ambulatory Visit (INDEPENDENT_AMBULATORY_CARE_PROVIDER_SITE_OTHER): Payer: Self-pay | Admitting: Adult Health

## 2016-02-08 VITALS — BP 160/100 | HR 80 | Temp 98.0°F | Resp 20 | Ht 63.0 in | Wt 196.5 lb

## 2016-02-08 DIAGNOSIS — R220 Localized swelling, mass and lump, head: Secondary | ICD-10-CM

## 2016-02-08 DIAGNOSIS — I10 Essential (primary) hypertension: Secondary | ICD-10-CM

## 2016-02-08 DIAGNOSIS — E039 Hypothyroidism, unspecified: Secondary | ICD-10-CM

## 2016-02-08 NOTE — Progress Notes (Signed)
Pre visit review using our clinic review tool, if applicable. No additional management support is needed unless otherwise documented below in the visit note. 

## 2016-02-08 NOTE — Progress Notes (Signed)
   Subjective:    Patient ID: April Gonzalez, female    DOB: [birth year], 55 y.o.   MRN: [id number]  HPI  55 year old female who presents to the office today for the acute complaint of upper lip swelling and pain for the last 24 hours. She denies any new lip balms or ointments. Has not been bit by an insect and has not changed her diet. She has not started any new medications. She reports " it feels like it is swollen and there is a crack in the skin."   She would also like to come off her blood pressure medication and thyroid medication. She has been taking thyroid medication daily but feels as though it is thinning out her hair. She has not been taking her blood pressure medication  Review of Systems  Constitutional: Negative.   Respiratory: Negative.   Cardiovascular: Negative.   Skin: Positive for wound.       Edema of upper lip  Neurological: Negative.   All other systems reviewed and are negative.      Objective:   Physical Exam  Constitutional: She is oriented to person, place, and time. She appears well-developed and well-nourished. No distress.  Cardiovascular: Normal rate, regular rhythm, normal heart sounds and intact distal pulses.  Exam reveals no gallop and no friction rub.   No murmur heard. Pulmonary/Chest: Effort normal and breath sounds normal. No respiratory distress. She has no wheezes. She has no rales. She exhibits no tenderness.  Neurological: She is alert and oriented to person, place, and time.  Skin: Skin is warm and dry. No rash noted. She is not diaphoretic. No erythema. No pallor.  No edema or wound seen on the lips  Psychiatric: She has a normal mood and affect. Her behavior is normal. Judgment and thought content normal.  Nursing note and vitals reviewed.      Assessment & Plan:  1. Swelling of upper lip - No swelling to wound to upper lip noted - Can try lip balm  - Follow up if no improvement   2. Hypothyroidism, unspecified type - Will check her  thyroid level at her physical next month   3. Essential hypertension - Her BP is 160/100.  - She was advised to stay on her blood pressure medications - Take when she gets home    [name], NP

## 2016-02-21 ENCOUNTER — Telehealth: Payer: Self-pay | Admitting: Adult Health

## 2016-02-21 NOTE — Telephone Encounter (Signed)
Pt need a note to take frequent bath room breaks due to taking htn med. Pt will like to pick up note

## 2016-02-21 NOTE — Telephone Encounter (Signed)
Please advise 

## 2016-02-22 ENCOUNTER — Encounter: Payer: Self-pay | Admitting: Adult Health

## 2016-03-01 ENCOUNTER — Ambulatory Visit: Payer: Self-pay

## 2016-03-07 ENCOUNTER — Other Ambulatory Visit: Payer: BLUE CROSS/BLUE SHIELD

## 2016-03-13 ENCOUNTER — Encounter: Payer: BLUE CROSS/BLUE SHIELD | Admitting: Adult Health

## 2016-03-27 ENCOUNTER — Ambulatory Visit (INDEPENDENT_AMBULATORY_CARE_PROVIDER_SITE_OTHER): Payer: No Typology Code available for payment source | Admitting: Adult Health

## 2016-03-27 ENCOUNTER — Encounter: Payer: Self-pay | Admitting: Adult Health

## 2016-03-27 VITALS — BP 126/72 | Temp 97.7°F | Ht 63.0 in | Wt 199.5 lb

## 2016-03-27 DIAGNOSIS — L988 Other specified disorders of the skin and subcutaneous tissue: Secondary | ICD-10-CM | POA: Diagnosis not present

## 2016-03-27 DIAGNOSIS — W57XXXA Bitten or stung by nonvenomous insect and other nonvenomous arthropods, initial encounter: Secondary | ICD-10-CM | POA: Diagnosis not present

## 2016-03-27 NOTE — Progress Notes (Signed)
Subjective:    Patient ID: April Gonzalez, female    DOB: 1961/03/31, 55 y.o.   MRN: [id number]  HPI  55 year old female who  has a past medical history of Anemia (05/2012); Fibroids; H/O uterine prolapse; Heart murmur; Heart valve problem; History of blood transfusion; Hyperlipidemia; Hypertension; Seizures ([place]); and Thyroid disorder. He presents to the office tonight the acute complaint of bug bites on bilateral upper arms. She reports that she first noticed these bites approximately 3 days ago and is unsure of what kind of bug bit her. She reports that she had one while on each upper arm these welts have pretty much resolved at current time. He was applying hydrocortisone cream and take Benadryl which she endorses helped but did not fully resolve the itching. She no longer has itching and denies any signs of infection   Review of Systems  Constitutional: Negative.   Respiratory: Negative.   Cardiovascular: Negative.   Skin: Positive for color change and wound.  All other systems reviewed and are negative.  Past Medical History:  Diagnosis Date  . Anemia 05/2012   transfusion  . Fibroids    H/O myomectomy  . H/O uterine prolapse   . Heart murmur   . Heart valve problem    Blockage  . History of blood transfusion   . Hyperlipidemia   . Hypertension   . Seizures ([place])    last seizure age 66 or 10 per pt report  . Thyroid disorder     Social History   Social History  . Marital status: Married    Spouse name: N/A  . Number of children: 2  . Years of education: 15   Occupational History  . Not on file.   Social History Main Topics  . Smoking status: Never Smoker  . Smokeless tobacco: Never Used  . Alcohol use 0.0 oz/week     Comment: rare  . Drug use: No  . Sexual activity: Yes    Birth control/ protection: Surgical     Comment: BTL    Other Topics Concern  . Not on file   Social History Narrative   She works for [employer]    Married   Has 2 children      Past Surgical History:  Procedure Laterality Date  . ANKLE SURGERY     One plate, seven screws in the right ankle   . BLADDER SUSPENSION N/A 12/10/2012   Procedure: TRANSVAGINAL TAPE (TVT) PROCEDURE;  Surgeon: Delice Lesch, MD;  Location: [facility];  Service: Gynecology;  Laterality: N/A;  . CYSTOCELE REPAIR N/A 12/10/2012   Procedure: ANTERIOR REPAIR (CYSTOCELE);  Surgeon: Eldred Manges, MD;  Location: [facility];  Service: Gynecology;  Laterality: N/A;  . CYSTOSCOPY N/A 12/10/2012   Procedure: CYSTOSCOPY;  Surgeon: Delice Lesch, MD;  Location: [facility];  Service: Gynecology;  Laterality: N/A;  . EXCISION VAGINAL CYST    . MYOMECTOMY  70YRS AGO   HYSTEROSCOPIC  . TUBAL LIGATION  10/10/1991  . VAGINAL HYSTERECTOMY N/A 12/10/2012   Procedure: Total Vaginal Hysterectomy;  Surgeon: Eldred Manges, MD;  Location: [facility];  Service: Gynecology;  Laterality: N/A;    Family History  Problem Relation Age of Onset  . Heart failure Father   . Diabetes Mother   . High blood pressure Mother   . Arthritis Mother   . Breast cancer Maternal Aunt     in her 61's   . Breast cancer Cousin   . Thyroid disease  Sister     Allergies  Allergen Reactions  . Methimazole Anaphylaxis  . Nitroglycerin Other (See Comments)    sweating  . Other Other (See Comments)    Seasonal / dog hair   . Tetracycline Hcl Other (See Comments)    REACTION: elevated blood pressure  . Shrimp [Shellfish Allergy] Itching  . Penicillins Other (See Comments)    Has patient had a PCN reaction causing immediate rash, facial/tongue/throat swelling, SOB or lightheadedness with hypotension:  Unknown 2 years ago Has patient had a PCN reaction causing severe rash involving mucus membranes or skin necrosis: No Has patient had a PCN reaction that required hospitalization No Has patient had a PCN reaction occurring within the last 10 years: Yes If all of the above answers are "NO", then may proceed with Cephalosporin use.   .  Terbutaline Other (See Comments)    Current Outpatient Prescriptions on File Prior to Visit  Medication Sig Dispense Refill  . aspirin EC 81 MG tablet Take 81 mg by mouth daily as needed (for anti-platelet therapy). Reported on 09/18/2015    . cetirizine (ZYRTEC ALLERGY) 10 MG tablet Take 1 tablet (10 mg total) by mouth daily. (Patient taking differently: Take 10 mg by mouth daily as needed for allergies. ) 30 tablet 0  . cyclobenzaprine (FLEXERIL) 10 MG tablet Take 1 tablet (10 mg total) by mouth 3 (three) times daily as needed for muscle spasms. 30 tablet 0  . EPINEPHrine (EPIPEN 2-PAK) 0.3 mg/0.3 mL IJ SOAJ injection Inject 0.3 mLs (0.3 mg total) into the muscle once. If you develop difficulty breathing 2 Device 0  . ibuprofen (ADVIL,MOTRIN) 200 MG tablet Take 800 mg by mouth daily as needed for moderate pain.    Marland Kitchen levothyroxine (SYNTHROID, LEVOTHROID) 25 MCG tablet Take 1 tablet (25 mcg total) by mouth daily before breakfast. 30 tablet 6  . losartan-hydrochlorothiazide (HYZAAR) 50-12.5 MG tablet Take 1 tablet by mouth daily. 90 tablet 3  . methylPREDNISolone (MEDROL DOSEPAK) 4 MG TBPK tablet Take as directed 21 tablet 0  . triamcinolone cream (KENALOG) 0.1 % Apply 1 application topically daily as needed.  0   No current facility-administered medications on file prior to visit.     BP 126/72   Temp 97.7 F (36.5 C) (Oral)   Ht 5\' 3"  (1.6 m)   Wt 199 lb 8 oz (90.5 kg)   LMP 12/03/2012   BMI 35.34 kg/m        Objective:   Physical Exam  Constitutional: She is oriented to person, place, and time. She appears well-developed and well-nourished. No distress.  Cardiovascular: Normal rate, regular rhythm, normal heart sounds and intact distal pulses.  Exam reveals no gallop and no friction rub.   No murmur heard. Pulmonary/Chest: Effort normal and breath sounds normal. No respiratory distress. She has no wheezes. She has no rales. She exhibits no tenderness.  Neurological: She is alert  and oriented to person, place, and time.  Skin: Skin is warm and dry. She is not diaphoretic.  2 pinpoint welts, one on each arm noticed. There is no redness, warmth, drainage, signs of infection noted  Psychiatric: She has a normal mood and affect. Her behavior is normal. Judgment and thought content normal.  Nursing note and vitals reviewed.      Assessment & Plan:  1. Insect bite, initial encounter - Appears to prematurity been resolved. She can continue to apply hydrocortisone cream as she sees fit. - Inspect bed for bed bugs - Follow  up as needed  [name], NP

## 2016-04-10 ENCOUNTER — Ambulatory Visit (INDEPENDENT_AMBULATORY_CARE_PROVIDER_SITE_OTHER): Payer: No Typology Code available for payment source | Admitting: Adult Health

## 2016-04-10 ENCOUNTER — Encounter: Payer: Self-pay | Admitting: Adult Health

## 2016-04-10 VITALS — BP 124/72 | Temp 97.7°F | Ht 63.0 in | Wt 199.1 lb

## 2016-04-10 DIAGNOSIS — Z0000 Encounter for general adult medical examination without abnormal findings: Secondary | ICD-10-CM | POA: Diagnosis not present

## 2016-04-10 DIAGNOSIS — M5412 Radiculopathy, cervical region: Secondary | ICD-10-CM

## 2016-04-10 DIAGNOSIS — R1084 Generalized abdominal pain: Secondary | ICD-10-CM | POA: Diagnosis not present

## 2016-04-10 DIAGNOSIS — N644 Mastodynia: Secondary | ICD-10-CM | POA: Diagnosis not present

## 2016-04-10 NOTE — Patient Instructions (Signed)
Things you need to do   1. Blood work  2. Follow up for mammogram  3. Follow up for colonoscopy 4. Find a dentist 5. Start a probiotic 6. Follow up for GYN exam   I will send in a prescription for prednisone to help with your pinched nerve

## 2016-04-10 NOTE — Progress Notes (Signed)
Subjective:    Patient ID: [name], female    DOB: 03/28/1961, 55 y.o.   MRN: [id number]  HPI Patient presents for yearly preventative medicine examination. She is a pleasant 55 year old female who  has a past medical history of Anemia (05/2012); Fibroids; H/O uterine prolapse; Heart murmur; Heart valve problem; History of blood transfusion; Hyperlipidemia; Hypertension; Seizures ([place]); and Thyroid disorder.  All immunizations and health maintenance protocols were reviewed with the patient and needed orders were placed.  Appropriate screening laboratory values were ordered for the patient including screening of hyperlipidemia, renal function and hepatic function.  Medication reconciliation,  past medical history, social history, problem list and allergies were reviewed in detail with the patient  Goals were established with regard to weight loss, exercise, and  diet in compliance with medications. She reports that she is going to the gym 3-4 times per week and is doing 25 minutes on the treadmill per session. She is eating healthy and is doing a low sodium diet.   She has a GYN but not has scheduled an appointment with them. She is not up to date on her mammogram nor is she up to date on her colonoscopy - she has to schedule these appointments. She does not see a dentist nor eye doctor  She reports multiple acute complaints today  1. Bilateral breast tenderness and she reports has been going on "a few months". He denies any dimpling, discharge, redness or warmth. Her last mammogram was in March 2014  2.  Right shoulder pain. Again she reports that she's had this pain for" a couple of months". Denies any trauma to the area has pain with palpation along scapula. No loss of range of motion in her grip strength. She reports no numbness or tingling in her fingers  3. Generalized abdominal pain that has been apparent for approximately 4 months. Does have episodes of diarrhea but denies any  bleeding per rectum. She is due for colonoscopy-has not scheduled the procedure  Review of Systems  Constitutional: Negative.   HENT: Negative.   Eyes: Negative.   Respiratory: Negative.   Cardiovascular: Negative.   Gastrointestinal: Positive for abdominal pain and diarrhea.  Endocrine: Negative.   Genitourinary: Negative.   Musculoskeletal: Positive for arthralgias, back pain and myalgias. Negative for gait problem and joint swelling.  Skin: Negative.   Allergic/Immunologic: Negative.   Neurological: Negative.   Hematological: Negative.   Psychiatric/Behavioral: Negative.   All other systems reviewed and are negative.  Past Medical History:  Diagnosis Date  . Anemia 05/2012   transfusion  . Fibroids    H/O myomectomy  . H/O uterine prolapse   . Heart murmur   . Heart valve problem    Blockage  . History of blood transfusion   . Hyperlipidemia   . Hypertension   . Seizures ([place])    last seizure age 76 or 10 per pt report  . Thyroid disorder     Social History   Social History  . Marital status: Married    Spouse name: N/A  . Number of children: 2  . Years of education: 15   Occupational History  . Not on file.   Social History Main Topics  . Smoking status: Never Smoker  . Smokeless tobacco: Never Used  . Alcohol use 0.0 oz/week     Comment: rare  . Drug use: No  . Sexual activity: Yes    Birth control/ protection: Surgical     Comment: BTL  Other Topics Concern  . Not on file   Social History Narrative   She works for [employer]    Married   Has 2 children     Past Surgical History:  Procedure Laterality Date  . ANKLE SURGERY     One plate, seven screws in the right ankle   . BLADDER SUSPENSION N/A 12/10/2012   Procedure: TRANSVAGINAL TAPE (TVT) PROCEDURE;  Surgeon: Delice Lesch, MD;  Location: [facility];  Service: Gynecology;  Laterality: N/A;  . CYSTOCELE REPAIR N/A 12/10/2012   Procedure: ANTERIOR REPAIR (CYSTOCELE);  Surgeon: Eldred Manges, MD;  Location: [facility];  Service: Gynecology;  Laterality: N/A;  . CYSTOSCOPY N/A 12/10/2012   Procedure: CYSTOSCOPY;  Surgeon: Delice Lesch, MD;  Location: [facility];  Service: Gynecology;  Laterality: N/A;  . EXCISION VAGINAL CYST    . MYOMECTOMY  50YRS AGO   HYSTEROSCOPIC  . TUBAL LIGATION  10/10/1991  . VAGINAL HYSTERECTOMY N/A 12/10/2012   Procedure: Total Vaginal Hysterectomy;  Surgeon: Eldred Manges, MD;  Location: [facility];  Service: Gynecology;  Laterality: N/A;    Family History  Problem Relation Age of Onset  . Heart failure Father   . Diabetes Mother   . High blood pressure Mother   . Arthritis Mother   . Breast cancer Maternal Aunt     in her 77's   . Breast cancer Cousin   . Thyroid disease Sister     Allergies  Allergen Reactions  . Methimazole Anaphylaxis  . Nitroglycerin Other (See Comments)    sweating  . Other Other (See Comments)    Seasonal / dog hair   . Tetracycline Hcl Other (See Comments)    REACTION: elevated blood pressure  . Shrimp [Shellfish Allergy] Itching  . Penicillins Other (See Comments)    Has patient had a PCN reaction causing immediate rash, facial/tongue/throat swelling, SOB or lightheadedness with hypotension:  Unknown 2 years ago Has patient had a PCN reaction causing severe rash involving mucus membranes or skin necrosis: No Has patient had a PCN reaction that required hospitalization No Has patient had a PCN reaction occurring within the last 10 years: Yes If all of the above answers are "NO", then may proceed with Cephalosporin use.   . Terbutaline Other (See Comments)    Current Outpatient Prescriptions on File Prior to Visit  Medication Sig Dispense Refill  . aspirin EC 81 MG tablet Take 81 mg by mouth daily as needed (for anti-platelet therapy). Reported on 09/18/2015    . cetirizine (ZYRTEC ALLERGY) 10 MG tablet Take 1 tablet (10 mg total) by mouth daily. (Patient taking differently: Take 10 mg by mouth daily as needed  for allergies. ) 30 tablet 0  . cyclobenzaprine (FLEXERIL) 10 MG tablet Take 1 tablet (10 mg total) by mouth 3 (three) times daily as needed for muscle spasms. 30 tablet 0  . EPINEPHrine (EPIPEN 2-PAK) 0.3 mg/0.3 mL IJ SOAJ injection Inject 0.3 mLs (0.3 mg total) into the muscle once. If you develop difficulty breathing 2 Device 0  . ibuprofen (ADVIL,MOTRIN) 200 MG tablet Take 800 mg by mouth daily as needed for moderate pain.    Marland Kitchen levothyroxine (SYNTHROID, LEVOTHROID) 25 MCG tablet Take 1 tablet (25 mcg total) by mouth daily before breakfast. 30 tablet 6  . losartan-hydrochlorothiazide (HYZAAR) 50-12.5 MG tablet Take 1 tablet by mouth daily. 90 tablet 3  . methylPREDNISolone (MEDROL DOSEPAK) 4 MG TBPK tablet Take as directed 21 tablet 0  .  triamcinolone cream (KENALOG) 0.1 % Apply 1 application topically daily as needed.  0   No current facility-administered medications on file prior to visit.     BP 124/72   Temp 97.7 F (36.5 C) (Oral)   Ht 5\' 3"  (1.6 m)   Wt 199 lb 1.6 oz (90.3 kg)   LMP 12/03/2012   BMI 35.27 kg/m       Objective:   Physical Exam  Constitutional: She is oriented to person, place, and time. She appears well-developed and well-nourished. No distress.  HENT:  Head: Normocephalic and atraumatic.  Right Ear: External ear normal.  Left Ear: External ear normal.  Nose: Nose normal.  Mouth/Throat: Oropharynx is clear and moist. No oropharyngeal exudate.  Eyes: Conjunctivae and EOM are normal. Pupils are equal, round, and reactive to light. Right eye exhibits no discharge. Left eye exhibits no discharge. No scleral icterus.  Neck: Normal range of motion. Neck supple. No JVD present. Carotid bruit is not present. No tracheal deviation present. No thyromegaly present.  Cardiovascular: Normal rate, regular rhythm, normal heart sounds and intact distal pulses.  Exam reveals no gallop and no friction rub.   No murmur heard. Pulmonary/Chest: Effort normal and breath sounds  normal. No stridor. No respiratory distress. She has no wheezes. She has no rales. She exhibits no tenderness.  Abdominal: Soft. Normal appearance and bowel sounds are normal. She exhibits no distension and no mass. There is no hepatosplenomegaly, splenomegaly or hepatomegaly. There is tenderness in the right upper quadrant, right lower quadrant, epigastric area, left upper quadrant and left lower quadrant. There is no rigidity, no rebound, no guarding, no CVA tenderness, no tenderness at McBurney's point and negative Murphy's sign. No hernia.  Genitourinary: There is breast tenderness. No breast swelling, discharge or bleeding. Pelvic exam was performed with patient supine.  Genitourinary Comments: Refused GYN exam.  Breast Exam: Generalized tenderness bilateral breasts. No masses, dimpling, discharge or swelling noted   Musculoskeletal: Normal range of motion. She exhibits tenderness. She exhibits no edema or deformity.  Tenderness around right scapula as well as across acromion. No bruising noted and has full range of motion  Lymphadenopathy:    She has no cervical adenopathy.  Neurological: She is alert and oriented to person, place, and time. She has normal strength and normal reflexes. She displays normal reflexes. No cranial nerve deficit or sensory deficit. She exhibits normal muscle tone. Coordination normal.  Skin: Skin is warm, dry and intact. No rash noted. She is not diaphoretic. No erythema. No pallor.  Psychiatric: She has a normal mood and affect. Her behavior is normal. Judgment and thought content normal. Cognition and memory are normal.  Nursing note and vitals reviewed.     Assessment & Plan:  1. Routine general medical examination at a health care facility - Labs were placed prior to this appointment but she failed to have them drawn. She will follow-up this week while fasting. - Educated that she needs to work on improving her health while doing health maintenance issues such  as mammogram, colonoscopy, and dental visits. - Increase aerobic exercise duration from 25 minutes to 45 minutes each time she goes to the gym - Continue to eat healthy - Like her to follow up in 3 months to see where she stands on her health maintenance issues. - Follow-up sooner if needed  2. Cervical radiculopathy - methylPREDNISolone (MEDROL DOSEPAK) 4 MG TBPK tablet; Take as directed  Dispense: 21 tablet; Refill: 0 - Consider steroid injection into  the right shoulder. Consider referral to orthopedics if no improvement  3. Breast pain - Advised that she needs to call and schedule her mammogram  4. Generalized abdominal pain - Schedule colonoscopy as soon as possible - Start taking a probiotic - Follow-up if no improvement - Consider referral to GI  [name], NP

## 2016-04-11 ENCOUNTER — Other Ambulatory Visit: Payer: Self-pay | Admitting: Adult Health

## 2016-04-11 ENCOUNTER — Other Ambulatory Visit: Payer: No Typology Code available for payment source

## 2016-04-11 LAB — BASIC METABOLIC PANEL
BUN: 14 mg/dL (ref 6–23)
CHLORIDE: 106 meq/L (ref 96–112)
CO2: 26 meq/L (ref 19–32)
CREATININE: 0.79 mg/dL (ref 0.40–1.20)
Calcium: 9.6 mg/dL (ref 8.4–10.5)
GFR: 97.59 mL/min (ref >60.00)
Glucose, Bld: 100 mg/dL — ABNORMAL HIGH (ref 70–99)
POTASSIUM: 4.4 meq/L (ref 3.5–5.1)
SODIUM: 141 meq/L (ref 135–145)

## 2016-04-11 LAB — POC URINALSYSI DIPSTICK (AUTOMATED)
BILIRUBIN UA: NEGATIVE
Glucose, UA: NEGATIVE
KETONES UA: NEGATIVE
Leukocytes, UA: NEGATIVE
NITRITE UA: NEGATIVE
PH UA: 6.5
PROTEIN UA: NEGATIVE
RBC UA: NEGATIVE
Spec Grav, UA: 1.02
Urobilinogen, UA: 0.2

## 2016-04-11 LAB — CBC WITH DIFFERENTIAL/PLATELET
BASOS PCT: 0.4 % (ref 0.0–3.0)
Basophils Absolute: 0 10*3/uL (ref 0.0–0.1)
EOS PCT: 3 % (ref 0.0–5.0)
Eosinophils Absolute: 0.2 10*3/uL (ref 0.0–0.7)
HEMATOCRIT: 41.4 % (ref 36.0–46.0)
HEMOGLOBIN: 14.1 g/dL (ref 12.0–15.0)
LYMPHS PCT: 27 % (ref 12.0–46.0)
Lymphs Abs: 1.9 10*3/uL (ref 0.7–4.0)
MCHC: 34 g/dL (ref 30.0–36.0)
MCV: 85.2 fl (ref 78.0–100.0)
MONOS PCT: 6.7 % (ref 3.0–12.0)
Monocytes Absolute: 0.5 10*3/uL (ref 0.1–1.0)
NEUTROS ABS: 4.4 10*3/uL (ref 1.4–7.7)
Neutrophils Relative %: 62.9 % (ref 43.0–77.0)
PLATELETS: 221 10*3/uL (ref 150.0–400.0)
RBC: 4.86 Mil/uL (ref 3.87–5.11)
RDW: 12.8 % (ref 11.5–15.5)
WBC: 6.9 10*3/uL (ref 4.0–10.5)

## 2016-04-11 LAB — LIPID PANEL
Cholesterol: 231 mg/dL — ABNORMAL HIGH (ref 0–200)
HDL: 41.9 mg/dL (ref >39.00)
LDL CALC: 159 mg/dL — AB (ref 0–99)
NONHDL: 189.11
Total CHOL/HDL Ratio: 6
Triglycerides: 153 mg/dL — ABNORMAL HIGH (ref 0.0–149.0)
VLDL: 30.6 mg/dL (ref 0.0–40.0)

## 2016-04-11 LAB — HEPATIC FUNCTION PANEL
ALBUMIN: 4.3 g/dL (ref 3.5–5.2)
ALT: 19 U/L (ref 0–35)
AST: 12 U/L (ref 0–37)
Alkaline Phosphatase: 106 U/L (ref 39–117)
Bilirubin, Direct: 0.1 mg/dL (ref 0.0–0.3)
TOTAL PROTEIN: 7.5 g/dL (ref 6.0–8.3)
Total Bilirubin: 0.5 mg/dL (ref 0.2–1.2)

## 2016-04-11 LAB — TSH: TSH: 2.13 u[IU]/mL (ref 0.35–4.50)

## 2016-04-11 MED ORDER — METHYLPREDNISOLONE 4 MG PO TBPK: ORAL_TABLET | ORAL | 0 refills | Status: DC

## 2016-04-11 NOTE — Addendum Note (Signed)
Addended by: Elmer Picker on: 04/11/2016 09:07 AM   Modules accepted: Orders

## 2016-04-16 ENCOUNTER — Telehealth: Payer: Self-pay | Admitting: *Deleted

## 2016-04-16 NOTE — Telephone Encounter (Signed)
Please call pt and schedule Mammogram. 

## 2016-05-10 ENCOUNTER — Telehealth: Payer: Self-pay | Admitting: Neurology

## 2016-05-10 ENCOUNTER — Ambulatory Visit: Payer: No Typology Code available for payment source | Admitting: Neurology

## 2016-05-10 NOTE — Telephone Encounter (Signed)
Since I have not seen her in the past for this reason, we can only recommend either Tylenol, Aleve, or Ibuprofen. If she needs stronger medication, would have to ask PCP. Thanks

## 2016-05-10 NOTE — Telephone Encounter (Signed)
April Gonzalez March 29, 2061. Patient called back. I did let her know that the nurse tried to contact her but no VM set up. I did tell her what Dr. Delice Lesch recommended in her response to my earlier message. She said she would contact her PCP. Thank you

## 2016-05-10 NOTE — Telephone Encounter (Signed)
April Gonzalez June 23, 2060. She was late for her appointment today. She was rescheduled. She did ask if she could have something called in for her head pain? She uses [facility] on [address]. Her # is [id number]. Thank you

## 2016-05-10 NOTE — Telephone Encounter (Signed)
Attempted to call patient no VM set up

## 2016-05-10 NOTE — Telephone Encounter (Signed)
Please advised  

## 2016-05-13 ENCOUNTER — Encounter: Payer: Self-pay | Admitting: Adult Health

## 2016-05-14 ENCOUNTER — Encounter: Payer: Self-pay | Admitting: Neurology

## 2016-05-31 ENCOUNTER — Ambulatory Visit (INDEPENDENT_AMBULATORY_CARE_PROVIDER_SITE_OTHER): Payer: BLUE CROSS/BLUE SHIELD | Admitting: Adult Health

## 2016-05-31 ENCOUNTER — Encounter: Payer: Self-pay | Admitting: Adult Health

## 2016-05-31 VITALS — BP 152/102 | HR 77 | Temp 97.8°F | Ht 63.0 in | Wt 200.8 lb

## 2016-05-31 DIAGNOSIS — R208 Other disturbances of skin sensation: Secondary | ICD-10-CM | POA: Diagnosis not present

## 2016-05-31 DIAGNOSIS — J029 Acute pharyngitis, unspecified: Secondary | ICD-10-CM | POA: Diagnosis not present

## 2016-05-31 DIAGNOSIS — R51 Headache: Secondary | ICD-10-CM | POA: Diagnosis not present

## 2016-05-31 DIAGNOSIS — R519 Headache, unspecified: Secondary | ICD-10-CM

## 2016-05-31 DIAGNOSIS — G8929 Other chronic pain: Secondary | ICD-10-CM

## 2016-05-31 MED ORDER — NAPROXEN 500 MG PO TABS: 500.0000 mg | ORAL_TABLET | Freq: Two times a day (BID) | ORAL | 0 refills | Status: DC

## 2016-05-31 NOTE — Progress Notes (Signed)
Subjective:    Patient ID: April Gonzalez, female    DOB: 1960-06-02, 56 y.o.   MRN: [id number]  HPI  56 year old pleasant AA female who  has a past medical history of Anemia (05/2012); Fibroids; H/O uterine prolapse; Heart murmur; Heart valve problem; History of blood transfusion; Hyperlipidemia; Hypertension; Seizures ([place]); and Thyroid disorder. With multiple complaints  1. She feels as though she has worsening decreased sensation in her upper extremities which has led to her "dropping things all the time". She continues, "my phone is all beat up because I drop it all the time.  There are times when I just have no feeling in my hands and I feel like I have no strength when I pick up something up." This is happening on a more frequent basis.   2. More constant headaches that are not relieved with ibuprofen or Advil. In the office today and appears that her headache is located unilateral on the left side  3. Sore throat - present for an unknown amount of time. She denies any fever, nausea, vomiting or. diarrhea. She denies feeling acutely ill. She has no cough  She is tearful during this exam stating" it feels like I am always taking two steps backwards"     Review of Systems See HPI  Past Medical History:  Diagnosis Date  . Anemia 05/2012   transfusion  . Fibroids    H/O myomectomy  . H/O uterine prolapse   . Heart murmur   . Heart valve problem    Blockage  . History of blood transfusion   . Hyperlipidemia   . Hypertension   . Seizures ([place])    last seizure age 33 or 10 per pt report  . Thyroid disorder     Social History   Social History  . Marital status: Married    Spouse name: N/A  . Number of children: 2  . Years of education: 15   Occupational History  . Not on file.   Social History Main Topics  . Smoking status: Never Smoker  . Smokeless tobacco: Never Used  . Alcohol use 0.0 oz/week     Comment: rare  . Drug use: No  . Sexual activity: Yes    Birth  control/ protection: Surgical     Comment: BTL    Other Topics Concern  . Not on file   Social History Narrative   She works for [employer]    Married   Has 2 children     Past Surgical History:  Procedure Laterality Date  . ANKLE SURGERY     One plate, seven screws in the right ankle   . BLADDER SUSPENSION N/A 12/10/2012   Procedure: TRANSVAGINAL TAPE (TVT) PROCEDURE;  Surgeon: Delice Lesch, MD;  Location: [facility];  Service: Gynecology;  Laterality: N/A;  . CYSTOCELE REPAIR N/A 12/10/2012   Procedure: ANTERIOR REPAIR (CYSTOCELE);  Surgeon: Eldred Manges, MD;  Location: [facility];  Service: Gynecology;  Laterality: N/A;  . CYSTOSCOPY N/A 12/10/2012   Procedure: CYSTOSCOPY;  Surgeon: Delice Lesch, MD;  Location: [facility];  Service: Gynecology;  Laterality: N/A;  . EXCISION VAGINAL CYST    . MYOMECTOMY  25YRS AGO   HYSTEROSCOPIC  . TUBAL LIGATION  10/10/1991  . VAGINAL HYSTERECTOMY N/A 12/10/2012   Procedure: Total Vaginal Hysterectomy;  Surgeon: Eldred Manges, MD;  Location: [facility];  Service: Gynecology;  Laterality: N/A;    Family History  Problem Relation Age of Onset  .  Heart failure Father   . Diabetes Mother   . High blood pressure Mother   . Arthritis Mother   . Breast cancer Maternal Aunt     in her 33's   . Breast cancer Cousin   . Thyroid disease Sister     Allergies  Allergen Reactions  . Methimazole Anaphylaxis  . Nitroglycerin Other (See Comments)    sweating  . Other Other (See Comments)    Seasonal / dog hair   . Tetracycline Hcl Other (See Comments)    REACTION: elevated blood pressure  . Shrimp [Shellfish Allergy] Itching  . Penicillins Other (See Comments)    Has patient had a PCN reaction causing immediate rash, facial/tongue/throat swelling, SOB or lightheadedness with hypotension:  Unknown 2 years ago Has patient had a PCN reaction causing severe rash involving mucus membranes or skin necrosis: No Has patient had a PCN reaction that required  hospitalization No Has patient had a PCN reaction occurring within the last 10 years: Yes If all of the above answers are "NO", then may proceed with Cephalosporin use.   . Terbutaline Other (See Comments)    Current Outpatient Prescriptions on File Prior to Visit  Medication Sig Dispense Refill  . aspirin EC 81 MG tablet Take 81 mg by mouth daily as needed (for anti-platelet therapy). Reported on 09/18/2015    . cetirizine (ZYRTEC ALLERGY) 10 MG tablet Take 1 tablet (10 mg total) by mouth daily. (Patient taking differently: Take 10 mg by mouth daily as needed for allergies. ) 30 tablet 0  . EPINEPHrine (EPIPEN 2-PAK) 0.3 mg/0.3 mL IJ SOAJ injection Inject 0.3 mLs (0.3 mg total) into the muscle once. If you develop difficulty breathing 2 Device 0  . ibuprofen (ADVIL,MOTRIN) 200 MG tablet Take 800 mg by mouth daily as needed for moderate pain.    Marland Kitchen levothyroxine (SYNTHROID, LEVOTHROID) 25 MCG tablet Take 1 tablet (25 mcg total) by mouth daily before breakfast. 30 tablet 6  . losartan-hydrochlorothiazide (HYZAAR) 50-12.5 MG tablet Take 1 tablet by mouth daily. 90 tablet 3  . methylPREDNISolone (MEDROL DOSEPAK) 4 MG TBPK tablet Take as directed 21 tablet 0  . triamcinolone cream (KENALOG) 0.1 % Apply 1 application topically daily as needed.  0   No current facility-administered medications on file prior to visit.     BP (!) 152/102 (BP Location: Right Arm, Patient Position: Sitting, Cuff Size: Normal)   Pulse 77   Temp 97.8 F (36.6 C) (Oral)   Ht 5\' 3"  (1.6 m)   Wt 200 lb 12.8 oz (91.1 kg)   LMP 12/03/2012   BMI 35.57 kg/m       Objective:   Physical Exam  Constitutional: She is oriented to person, place, and time. She appears well-developed and well-nourished. No distress.  HENT:  Mouth/Throat: Uvula is midline, oropharynx is clear and moist and mucous membranes are normal. No oropharyngeal exudate, posterior oropharyngeal edema, posterior oropharyngeal erythema or tonsillar  abscesses.  Eyes: Conjunctivae and EOM are normal. Pupils are equal, round, and reactive to light. Right eye exhibits no discharge. Left eye exhibits no discharge. No scleral icterus.  Neck: Normal range of motion. Neck supple.  Cardiovascular: Normal rate, regular rhythm, normal heart sounds and intact distal pulses.  Exam reveals no gallop and no friction rub.   No murmur heard. Pulmonary/Chest: Effort normal and breath sounds normal. No respiratory distress. She has no wheezes. She has no rales. She exhibits no tenderness.  Musculoskeletal: Normal range of motion. She exhibits  no edema, tenderness or deformity.  Lymphadenopathy:    She has no cervical adenopathy.  Neurological: She is alert and oriented to person, place, and time. She has normal strength. She displays no atrophy and no tremor. No cranial nerve deficit. She exhibits normal muscle tone. She displays no seizure activity. Coordination and gait normal.  No decreased grip strength. No decreased strength with or against resistance  Skin: Skin is warm and dry. No rash noted. She is not diaphoretic. No erythema. No pallor.  Psychiatric: She has a normal mood and affect. Her behavior is normal. Judgment and thought content normal.  Nursing note and vitals reviewed.     Assessment & Plan:  1. Decreased sensation of hand and upper extremity  Last MRI of brain in 2016 showed -  Solitary small left frontal white matter hyperintensity. Otherwise within normal limits. - MR Brain W Wo Contrast; Future - She has an upcoming appointment with neurology  2. Sore throat - No signs of infection.  - Possibly viral - She is going to do warm salt water gargles  3. Chronic intractable headache, unspecified headache type - MR Brain W Wo Contrast; Future - D/C Motrin or Advil - Will trial on Naproxyn   [name], NP

## 2016-06-03 ENCOUNTER — Encounter: Payer: Self-pay | Admitting: Family Medicine

## 2016-06-03 ENCOUNTER — Ambulatory Visit (INDEPENDENT_AMBULATORY_CARE_PROVIDER_SITE_OTHER): Payer: BLUE CROSS/BLUE SHIELD | Admitting: Family Medicine

## 2016-06-03 VITALS — BP 142/80 | HR 71 | Temp 97.8°F | Ht 63.0 in | Wt 201.2 lb

## 2016-06-03 DIAGNOSIS — R6889 Other general symptoms and signs: Secondary | ICD-10-CM | POA: Diagnosis not present

## 2016-06-03 MED ORDER — OSELTAMIVIR PHOSPHATE 75 MG PO CAPS: 75.0000 mg | ORAL_CAPSULE | Freq: Two times a day (BID) | ORAL | 0 refills | Status: DC

## 2016-06-03 NOTE — Progress Notes (Signed)
Subjective:    Patient ID: April Gonzalez, female    DOB: [birth year], 56 y.o.   MRN: [id number]  HPI  April Gonzalez is a 56 year old female who presents today with myalgias and headache for 1 day.  Associated decreased in appetite, fatigue, sore throat, and nausea without vomiting, sweats, chills, sinus pressure/pain, rare cough that is nonproductive, and right ear pain. She denies nasal congestion and rhinitis. Recent sick contact exposure at home with relatives who were diagnosed with flu. She denies recent antibiotic therapy. She is not a smoker. Denies history of asthma/bronchitis. No influenza vaccine at this time. No treatments have been tried at home.   Review of Systems  Constitutional: Positive for appetite change, chills and fatigue.  HENT: Positive for ear pain, sinus pain, sinus pressure and sore throat.   Respiratory: Positive for cough. Negative for shortness of breath and wheezing.   Cardiovascular: Negative for chest pain and palpitations.  Gastrointestinal: Positive for nausea. Negative for diarrhea and vomiting.  Musculoskeletal: Positive for myalgias.  Neurological: Negative for dizziness, light-headedness and headaches.   Past Medical History:  Diagnosis Date  . Anemia 05/2012   transfusion  . Fibroids    H/O myomectomy  . H/O uterine prolapse   . Heart murmur   . Heart valve problem    Blockage  . History of blood transfusion   . Hyperlipidemia   . Hypertension   . Seizures ([place])    last seizure age 60 or 10 per pt report  . Thyroid disorder      Social History   Social History  . Marital status: Married    Spouse name: N/A  . Number of children: 2  . Years of education: 15   Occupational History  . Not on file.   Social History Main Topics  . Smoking status: Never Smoker  . Smokeless tobacco: Never Used  . Alcohol use 0.0 oz/week     Comment: rare  . Drug use: No  . Sexual activity: Yes    Birth control/ protection: Surgical     Comment: BTL     Other Topics Concern  . Not on file   Social History Narrative   She works for [employer]    Married   Has 2 children     Past Surgical History:  Procedure Laterality Date  . ANKLE SURGERY     One plate, seven screws in the right ankle   . BLADDER SUSPENSION N/A 12/10/2012   Procedure: TRANSVAGINAL TAPE (TVT) PROCEDURE;  Surgeon: Delice Lesch, MD;  Location: [facility];  Service: Gynecology;  Laterality: N/A;  . CYSTOCELE REPAIR N/A 12/10/2012   Procedure: ANTERIOR REPAIR (CYSTOCELE);  Surgeon: Eldred Manges, MD;  Location: [facility];  Service: Gynecology;  Laterality: N/A;  . CYSTOSCOPY N/A 12/10/2012   Procedure: CYSTOSCOPY;  Surgeon: Delice Lesch, MD;  Location: [facility];  Service: Gynecology;  Laterality: N/A;  . EXCISION VAGINAL CYST    . MYOMECTOMY  56YRS AGO   HYSTEROSCOPIC  . TUBAL LIGATION  10/10/1991  . VAGINAL HYSTERECTOMY N/A 12/10/2012   Procedure: Total Vaginal Hysterectomy;  Surgeon: Eldred Manges, MD;  Location: [facility];  Service: Gynecology;  Laterality: N/A;    Family History  Problem Relation Age of Onset  . Heart failure Father   . Diabetes Mother   . High blood pressure Mother   . Arthritis Mother   . Breast cancer Maternal Aunt     in her 68's   .  Breast cancer Cousin   . Thyroid disease Sister     Allergies  Allergen Reactions  . Methimazole Anaphylaxis  . Nitroglycerin Other (See Comments)    sweating  . Other Other (See Comments)    Seasonal / dog hair   . Tetracycline Hcl Other (See Comments)    REACTION: elevated blood pressure  . Shrimp [Shellfish Allergy] Itching  . Penicillins Other (See Comments)    Has patient had a PCN reaction causing immediate rash, facial/tongue/throat swelling, SOB or lightheadedness with hypotension:  Unknown 2 years ago Has patient had a PCN reaction causing severe rash involving mucus membranes or skin necrosis: No Has patient had a PCN reaction that required hospitalization No Has patient had a PCN  reaction occurring within the last 10 years: Yes If all of the above answers are "NO", then may proceed with Cephalosporin use.   . Terbutaline Other (See Comments)    Current Outpatient Prescriptions on File Prior to Visit  Medication Sig Dispense Refill  . aspirin EC 81 MG tablet Take 81 mg by mouth daily as needed (for anti-platelet therapy). Reported on 09/18/2015    . cetirizine (ZYRTEC ALLERGY) 10 MG tablet Take 1 tablet (10 mg total) by mouth daily. (Patient taking differently: Take 10 mg by mouth daily as needed for allergies. ) 30 tablet 0  . EPINEPHrine (EPIPEN 2-PAK) 0.3 mg/0.3 mL IJ SOAJ injection Inject 0.3 mLs (0.3 mg total) into the muscle once. If you develop difficulty breathing 2 Device 0  . ibuprofen (ADVIL,MOTRIN) 200 MG tablet Take 800 mg by mouth daily as needed for moderate pain.    Marland Kitchen levothyroxine (SYNTHROID, LEVOTHROID) 25 MCG tablet Take 1 tablet (25 mcg total) by mouth daily before breakfast. 30 tablet 6  . losartan-hydrochlorothiazide (HYZAAR) 50-12.5 MG tablet Take 1 tablet by mouth daily. 90 tablet 3  . methylPREDNISolone (MEDROL DOSEPAK) 4 MG TBPK tablet Take as directed 21 tablet 0  . naproxen (NAPROSYN) 500 MG tablet Take 1 tablet (500 mg total) by mouth 2 (two) times daily with a meal. 30 tablet 0  . triamcinolone cream (KENALOG) 0.1 % Apply 1 application topically daily as needed.  0   No current facility-administered medications on file prior to visit.     BP (!) 142/80 (BP Location: Right Arm, Patient Position: Sitting, Cuff Size: Normal)   Pulse 71   Temp 97.8 F (36.6 C) (Oral)   Ht 5\' 3"  (1.6 m)   Wt 201 lb 3.2 oz (91.3 kg)   LMP 12/03/2012   SpO2 98%   BMI 35.64 kg/m        Objective:   Physical Exam  Constitutional: She is oriented to person, place, and time. She appears well-developed and well-nourished.  HENT:  Right Ear: Tympanic membrane normal.  Left Ear: Tympanic membrane normal.  Nose: Right sinus exhibits maxillary sinus  tenderness. Right sinus exhibits no frontal sinus tenderness. Left sinus exhibits maxillary sinus tenderness. Left sinus exhibits no frontal sinus tenderness.  Mouth/Throat: Mucous membranes are normal. No oropharyngeal exudate or posterior oropharyngeal erythema.  Eyes: Pupils are equal, round, and reactive to light. No scleral icterus.  Neck: Neck supple.  Cardiovascular: Normal rate and regular rhythm.   Pulmonary/Chest: Effort normal and breath sounds normal. She has no wheezes. She has no rales.  Abdominal: Soft. Bowel sounds are normal. There is no tenderness.  Lymphadenopathy:    She has no cervical adenopathy.  Neurological: She is alert and oriented to person, place, and time.  Skin:  Skin is warm and dry. No rash noted.       Assessment & Plan:  1. Flu-like symptoms Exam and history support treatment with tamiflu. Suspect influenza; Advised supportive measures of rest, increased fluids, tylenol or ibuprofen for discomfort or fever. Follow up for further evaluation if symptoms do not improve in 3 to 4 days, worsen, or you develop a fever >101 or SOB.  - oseltamivir (TAMIFLU) 75 MG capsule; Take 1 capsule (75 mg total) by mouth 2 (two) times daily.  Dispense: 10 capsule; Refill: 0  Delano Metz, FNP-C

## 2016-06-03 NOTE — Progress Notes (Signed)
Pre visit review using our clinic review tool, if applicable. No additional management support is needed unless otherwise documented below in the visit note. 

## 2016-06-03 NOTE — Patient Instructions (Addendum)
Please take medication as directed and follow up if symptoms do not improve in 3 to 4 days, worsen, or she develops a fever >101.  Influenza, Adult Influenza ("the flu") is an infection in the lungs, nose, and throat (respiratory tract). It is caused by a virus. The flu causes many common cold symptoms, as well as a high fever and body aches. It can make you feel very sick. The flu spreads easily from person to person (is contagious). Getting a flu shot (influenza vaccination) every year is the best way to prevent the flu. Follow these instructions at home:  Take over-the-counter and prescription medicines only as told by your doctor.  Use a cool mist humidifier to add moisture (humidity) to the air in your home. This can make it easier to breathe.  Rest as needed.  Drink enough fluid to keep your pee (urine) clear or pale yellow.  Cover your mouth and nose when you cough or sneeze.  Wash your hands with soap and water often, especially after you cough or sneeze. If you cannot use soap and water, use hand sanitizer.  Stay home from work or school as told by your doctor. Unless you are visiting your doctor, try to avoid leaving home until your fever has been gone for 24 hours without the use of medicine.  Keep all follow-up visits as told by your doctor. This is important. How is this prevented?  Getting a yearly (annual) flu shot is the best way to avoid getting the flu. You may get the flu shot in late summer, fall, or winter. Ask your doctor when you should get your flu shot.  Wash your hands often or use hand sanitizer often.  Avoid contact with people who are sick during cold and flu season.  Eat healthy foods.  Drink plenty of fluids.  Get enough sleep.  Exercise regularly. Contact a doctor if:  You get new symptoms.  You have:  Chest pain.  Watery poop (diarrhea).  A fever.  Your cough gets worse.  You start to have more mucus.  You feel sick to your stomach  (nauseous).  You throw up (vomit). Get help right away if:  You start to be short of breath or have trouble breathing.  Your skin or nails turn a bluish color.  You have very bad pain or stiffness in your neck.  You get a sudden headache.  You get sudden pain in your face or ear.  You cannot stop throwing up. This information is not intended to replace advice given to you by your health care provider. Make sure you discuss any questions you have with your health care provider. Document Released: 01/30/2008 Document Revised: 09/28/2015 Document Reviewed: 02/14/2015 Elsevier Interactive Patient Education  2017 [employer].

## 2016-06-21 ENCOUNTER — Telehealth: Payer: Self-pay | Admitting: Adult Health

## 2016-06-21 NOTE — Telephone Encounter (Signed)
[facility] - Client [place] [facility]     Patient Name: April Gonzalez Initial Comment Caller states taking Levothyroxine, wanting to take a laxative as well. Wanting to know if that will interfere with her medication.   DOB: 1960/05/10      Nurse Assessment  Nurse: Luther Parody, RN, Malachy Mood Date/Time (Eastern Time): 06/21/2016 4:47:00 PM  Confirm and document reason for call. If symptomatic, describe symptoms. ---Caller states that she wants to take a laxative to "just clean herself out". Denies constipation and is asking for recommendations.  Does the patient have any new or worsening symptoms? ---No  Please document clinical information provided and list any resource used. ---Advised that MOM is usually considered safe to use as long as she dosent have kidney disease. Advised to check with the pharmacist though before purchasing and they can look for interactions with her meds. Verbalized understanding.    Guidelines     Guideline Title Affirmed Question Affirmed Notes        Final Disposition User   Clinical Call Luther Parody, RN, Malachy Mood

## 2016-06-27 ENCOUNTER — Encounter: Payer: Self-pay | Admitting: Adult Health

## 2016-06-27 NOTE — Telephone Encounter (Signed)
Pt would like to see if it is okay for her to take a herbal tea to detox her body?  Pt would also like to see if Tommi Rumps would update her work note with an end date of 05/05/17 for her to go to the bathroom due to her taking levothyroxine.  Pts employer is not wanting to get frequent notes concerning this same situation.

## 2016-06-27 NOTE — Telephone Encounter (Signed)
See below

## 2016-06-27 NOTE — Telephone Encounter (Signed)
Which herbal tea as she wanting to use? There is no scientific evidence proving that this type of detox does anything  Work note written and will be mailed to her

## 2016-06-28 NOTE — Telephone Encounter (Signed)
I attempted to contact patient - but voicemail box was full and I was unable to leave message.  Letter for employer has been placed in the mail.

## 2016-07-01 NOTE — Telephone Encounter (Signed)
Spoke with pt and gave Cory's advice regarding detox teas. She will think about whether or not she would like to use one. Nothing further needed at this time.

## 2016-07-02 NOTE — Telephone Encounter (Signed)
Pt had this done.

## 2016-07-03 ENCOUNTER — Encounter: Payer: Self-pay | Admitting: Neurology

## 2016-07-03 ENCOUNTER — Ambulatory Visit (INDEPENDENT_AMBULATORY_CARE_PROVIDER_SITE_OTHER): Payer: BLUE CROSS/BLUE SHIELD | Admitting: Neurology

## 2016-07-03 VITALS — BP 130/90 | HR 92 | Temp 97.7°F | Resp 16 | Ht 63.5 in | Wt 199.4 lb

## 2016-07-03 DIAGNOSIS — R519 Headache, unspecified: Secondary | ICD-10-CM

## 2016-07-03 DIAGNOSIS — R55 Syncope and collapse: Secondary | ICD-10-CM

## 2016-07-03 DIAGNOSIS — R51 Headache: Secondary | ICD-10-CM

## 2016-07-03 MED ORDER — NORTRIPTYLINE HCL 10 MG PO CAPS: ORAL_CAPSULE | ORAL | 6 refills | Status: DC
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
## 2016-07-03 NOTE — Patient Instructions (Addendum)
1. Start nortriptyline 10mg : Take 1 capsule at night for 2 weeks, then increase to 2 capsules at night 2. Minimize any over the counter pain medication to 2-3 times a week, otherwise taking it on a daily basis can cause more headaches 3. Follow-up in 4 months, call for any changes

## 2016-07-08 ENCOUNTER — Other Ambulatory Visit: Payer: Self-pay | Admitting: Adult Health

## 2016-07-10 ENCOUNTER — Ambulatory Visit
Admission: RE | Admit: 2016-07-10 | Discharge: 2016-07-10 | Disposition: A | Discharge status: Home / Self Care | Payer: BLUE CROSS/BLUE SHIELD | Source: Ambulatory Visit | Attending: Adult Health | Admitting: Adult Health

## 2016-07-10 DIAGNOSIS — R51 Headache: Secondary | ICD-10-CM

## 2016-07-10 DIAGNOSIS — R208 Other disturbances of skin sensation: Secondary | ICD-10-CM

## 2016-07-10 DIAGNOSIS — R519 Headache, unspecified: Secondary | ICD-10-CM

## 2016-07-10 DIAGNOSIS — G8929 Other chronic pain: Secondary | ICD-10-CM

## 2016-07-10 MED ORDER — GADOBENATE DIMEGLUMINE 529 MG/ML IV SOLN
20.0000 mL | Freq: Once | INTRAVENOUS | Status: AC | PRN
  Administered 2016-07-10: 17 mL via INTRAVENOUS

## 2016-07-24 ENCOUNTER — Telehealth: Payer: Self-pay | Admitting: Adult Health

## 2016-07-24 NOTE — Telephone Encounter (Signed)
April Gonzalez with [employer]  (pt's employer) sent a fax yesterday and wants to know if Tommi Rumps has had a chance to address it yet.   [phone] ext [phone]

## 2016-07-25 NOTE — Telephone Encounter (Signed)
I left detailed message for Sharyn Lull stating that form had been addressed and faxed. Thanks!

## 2016-08-26 ENCOUNTER — Other Ambulatory Visit: Payer: Self-pay | Admitting: Adult Health

## 2016-08-27 ENCOUNTER — Encounter: Payer: Self-pay | Admitting: Adult Health

## 2016-08-27 ENCOUNTER — Ambulatory Visit (INDEPENDENT_AMBULATORY_CARE_PROVIDER_SITE_OTHER): Payer: BLUE CROSS/BLUE SHIELD | Admitting: Adult Health

## 2016-08-27 VITALS — BP 132/70 | Temp 98.1°F | Ht 63.5 in | Wt 202.8 lb

## 2016-08-27 DIAGNOSIS — R208 Other disturbances of skin sensation: Secondary | ICD-10-CM

## 2016-08-27 DIAGNOSIS — R200 Anesthesia of skin: Secondary | ICD-10-CM | POA: Diagnosis not present

## 2016-08-27 NOTE — Progress Notes (Signed)
Subjective:    Patient ID: April Gonzalez, female    DOB: 10-29-1960, 56 y.o.   MRN: [id number]  HPI  56 year old female who  has a past medical history of Anemia (05/2012); Fibroids; H/O uterine prolapse; Heart murmur; Heart valve problem; History of blood transfusion; Hyperlipidemia; Hypertension; Seizures ([place]); and Thyroid disorder. She presents to the office today for continued decreased sensation in her upper extremities as well as perceived swelling of her fingers. She reports that she continues to drop items and she can no longer wear her rings because her " fingers are too fat". She feels as though she has no strength in her hands and this is accompanied by a numbness and tingling sensation in her fingers. I last saw her for this in January 2018. At that time I advised her to follow up with Dr. Delice Lesch for evaluation of nerve conduction study. She reports today that she did see Dr. Delice Lesch for her head pain but forgot to mention the upper extremity complaint to her.   Dr. Delice Lesch prescribed her Nortiptyline 10 mg QHS with a taper to 20 mg. Kioni reports that this helped for " one day" .   She denies any fevers, feeling ill, rashes, or bug bites. [place] but does not go outside often   She has been tested for RA and ANA in the past, both tests were negative    Review of Systems See HPI   Past Medical History:  Diagnosis Date  . Anemia 05/2012   transfusion  . Fibroids    H/O myomectomy  . H/O uterine prolapse   . Heart murmur   . Heart valve problem    Blockage  . History of blood transfusion   . Hyperlipidemia   . Hypertension   . Seizures ([place])    last seizure age 78 or 10 per pt report  . Thyroid disorder     Social History   Social History  . Marital status: Married    Spouse name: N/A  . Number of children: 2  . Years of education: 15   Occupational History  . Not on file.   Social History Main Topics  . Smoking status: Never  Smoker  . Smokeless tobacco: Never Used  . Alcohol use 0.0 oz/week     Comment: rare  . Drug use: No  . Sexual activity: Yes    Birth control/ protection: Surgical     Comment: BTL    Other Topics Concern  . Not on file   Social History Narrative   She works for [employer]    Married   Has 2 children     Past Surgical History:  Procedure Laterality Date  . ANKLE SURGERY     One plate, seven screws in the right ankle   . BLADDER SUSPENSION N/A 12/10/2012   Procedure: TRANSVAGINAL TAPE (TVT) PROCEDURE;  Surgeon: Delice Lesch, MD;  Location: [facility];  Service: Gynecology;  Laterality: N/A;  . CYSTOCELE REPAIR N/A 12/10/2012   Procedure: ANTERIOR REPAIR (CYSTOCELE);  Surgeon: Eldred Manges, MD;  Location: [facility];  Service: Gynecology;  Laterality: N/A;  . CYSTOSCOPY N/A 12/10/2012   Procedure: CYSTOSCOPY;  Surgeon: Delice Lesch, MD;  Location: [facility];  Service: Gynecology;  Laterality: N/A;  . EXCISION VAGINAL CYST    . MYOMECTOMY  25YRS AGO   HYSTEROSCOPIC  . TUBAL LIGATION  10/10/1991  . VAGINAL HYSTERECTOMY N/A 12/10/2012  Procedure: Total Vaginal Hysterectomy;  Surgeon: Eldred Manges, MD;  Location: [facility];  Service: Gynecology;  Laterality: N/A;    Family History  Problem Relation Age of Onset  . Heart failure Father   . Diabetes Mother   . High blood pressure Mother   . Arthritis Mother   . Breast cancer Maternal Aunt     in her 31's   . Breast cancer Cousin   . Thyroid disease Sister     Allergies  Allergen Reactions  . Methimazole Anaphylaxis  . Nitroglycerin Other (See Comments)    sweating  . Other Other (See Comments)    Seasonal / dog hair   . Tetracycline Hcl Other (See Comments)    REACTION: elevated blood pressure  . Shrimp [Shellfish Allergy] Itching  . Penicillins Other (See Comments)    Has patient had a PCN reaction causing immediate rash, facial/tongue/throat swelling, SOB or lightheadedness with hypotension:  Unknown 2 years ago Has  patient had a PCN reaction causing severe rash involving mucus membranes or skin necrosis: No Has patient had a PCN reaction that required hospitalization No Has patient had a PCN reaction occurring within the last 10 years: Yes If all of the above answers are "NO", then may proceed with Cephalosporin use.   . Terbutaline Other (See Comments)    Current Outpatient Prescriptions on File Prior to Visit  Medication Sig Dispense Refill  . aspirin EC 81 MG tablet Take 81 mg by mouth daily as needed (for anti-platelet therapy). Reported on 09/18/2015    . cetirizine (ZYRTEC ALLERGY) 10 MG tablet Take 1 tablet (10 mg total) by mouth daily. (Patient taking differently: Take 10 mg by mouth daily as needed for allergies. ) 30 tablet 0  . EPINEPHrine (EPIPEN 2-PAK) 0.3 mg/0.3 mL IJ SOAJ injection Inject 0.3 mLs (0.3 mg total) into the muscle once. If you develop difficulty breathing 2 Device 0  . ibuprofen (ADVIL,MOTRIN) 200 MG tablet Take 800 mg by mouth daily as needed for moderate pain.    Marland Kitchen levothyroxine (SYNTHROID, LEVOTHROID) 25 MCG tablet TAKE 1 TABLET BY MOUTH DAILY BEFORE BREAKFAST 30 tablet 6  . losartan-hydrochlorothiazide (HYZAAR) 50-12.5 MG tablet Take 1 tablet by mouth daily. 90 tablet 3  . nortriptyline (PAMELOR) 10 MG capsule Take 1 capsule at night for 2 weeks, then increase to 2 capsules at night 60 capsule 6  . triamcinolone cream (KENALOG) 0.1 % Apply 1 application topically daily as needed.  0   No current facility-administered medications on file prior to visit.     BP 132/70 (BP Location: Left Arm, Patient Position: Sitting, Cuff Size: Normal)   Temp 98.1 F (36.7 C) (Oral)   Ht 5' 3.5" (1.613 m)   Wt 202 lb 12.8 oz (92 kg)   LMP 12/03/2012   BMI 35.36 kg/m       Objective:   Physical Exam  Constitutional: She is oriented to person, place, and time. She appears well-developed and well-nourished. No distress.  Cardiovascular: Normal rate, regular rhythm, normal heart  sounds and intact distal pulses.  Exam reveals no gallop and no friction rub.   No murmur heard. Pulmonary/Chest: Effort normal and breath sounds normal. No respiratory distress. She has no wheezes. She has no rales. She exhibits no tenderness.  Musculoskeletal: Normal range of motion. She exhibits no edema or tenderness.  5/5 grip strength in right hand, 4/5 in left hand  There is no apparent swelling noted in bilateral upper extremities, especially fingers.  Neurological: She is alert and oriented to person, place, and time.  Skin: Skin is warm and dry. No rash noted. She is not diaphoretic. No erythema. No pallor.  Psychiatric: She has a normal mood and affect. Her behavior is normal. Judgment and thought content normal.  Nursing note and vitals reviewed.     Assessment & Plan:  1. Decreased sensation of hand and upper extremity - I advised her to follow up with Dr. Delice Lesch to be evaluated asa a NCS may be beneficial. Will check Sed Rate, CRP and Lyme Antibody - although unlikely.  - Sedimentation Rate - C-reactive Protein - B. burgdorfi Antibody - Also advised to try Nortriptyline again, Start at 20 mg and increase by 10 mg every 2-3 days until she gets to 40mg .  - Follow up as needed  [name], NP

## 2016-08-27 NOTE — Patient Instructions (Addendum)
Please follow up with Dr. Delice Lesch and see if she would receommned a nerve conduction study for your numbness and tingling and loss of sensation in your hands.   As for the Nortriptyline take 20 mg ( two pills) and every 2 days go up 10 mg ( 1 pill) until you get to 40 mg. See if this helps with the nerve pain    I will follow up with you regarding your labs

## 2016-08-28 ENCOUNTER — Telehealth: Payer: Self-pay | Admitting: Adult Health

## 2016-08-28 LAB — SEDIMENTATION RATE: Sed Rate: 19 mm/hr (ref 0–30)

## 2016-08-28 LAB — C-REACTIVE PROTEIN: CRP: 0.7 mg/dL (ref 0.5–20.0)

## 2016-08-28 NOTE — Telephone Encounter (Signed)
See below

## 2016-08-28 NOTE — Telephone Encounter (Signed)
Pt was seen yesterday and is requesting cory to return her call around 1140 am today. This call is related to visit yesterday

## 2016-08-29 LAB — B. BURGDORFI ANTIBODIES

## 2016-09-15 ENCOUNTER — Encounter (HOSPITAL_COMMUNITY): Payer: Self-pay | Admitting: Emergency Medicine

## 2016-09-15 ENCOUNTER — Emergency Department (HOSPITAL_COMMUNITY)
Admission: EM | Admit: 2016-09-15 | Discharge: 2016-09-15 | Disposition: A | Discharge status: Home / Self Care | Payer: BLUE CROSS/BLUE SHIELD | Attending: Emergency Medicine | Admitting: Emergency Medicine

## 2016-09-15 DIAGNOSIS — I10 Essential (primary) hypertension: Secondary | ICD-10-CM | POA: Insufficient documentation

## 2016-09-15 DIAGNOSIS — Z7982 Long term (current) use of aspirin: Secondary | ICD-10-CM | POA: Insufficient documentation

## 2016-09-15 DIAGNOSIS — R55 Syncope and collapse: Secondary | ICD-10-CM

## 2016-09-15 DIAGNOSIS — Z79899 Other long term (current) drug therapy: Secondary | ICD-10-CM | POA: Insufficient documentation

## 2016-09-15 LAB — URINALYSIS, ROUTINE W REFLEX MICROSCOPIC
BILIRUBIN URINE: NEGATIVE
Glucose, UA: NEGATIVE mg/dL
Hgb urine dipstick: NEGATIVE
Ketones, ur: NEGATIVE mg/dL
Leukocytes, UA: NEGATIVE
NITRITE: NEGATIVE
PH: 5 (ref 5.0–8.0)
Protein, ur: NEGATIVE mg/dL
SPECIFIC GRAVITY, URINE: 1.025 (ref 1.005–1.030)

## 2016-09-15 LAB — BASIC METABOLIC PANEL
ANION GAP: 9 (ref 5–15)
BUN: 13 mg/dL (ref 6–20)
CO2: 24 mmol/L (ref 22–32)
Calcium: 9 mg/dL (ref 8.9–10.3)
Chloride: 104 mmol/L (ref 101–111)
Creatinine, Ser: 1 mg/dL (ref 0.44–1.00)
GFR calc Af Amer: >60 mL/min (ref >60)
GLUCOSE: 120 mg/dL — AB (ref 65–99)
POTASSIUM: 3.9 mmol/L (ref 3.5–5.1)
Sodium: 137 mmol/L (ref 135–145)

## 2016-09-15 LAB — CBC
HEMATOCRIT: 40.1 % (ref 36.0–46.0)
HEMOGLOBIN: 12.9 g/dL (ref 12.0–15.0)
MCH: 28.2 pg (ref 26.0–34.0)
MCHC: 32.2 g/dL (ref 30.0–36.0)
MCV: 87.7 fL (ref 78.0–100.0)
Platelets: 203 10*3/uL (ref 150–400)
RBC: 4.57 MIL/uL (ref 3.87–5.11)
RDW: 13 % (ref 11.5–15.5)
WBC: 7.1 10*3/uL (ref 4.0–10.5)

## 2016-09-15 LAB — CBG MONITORING, ED: Glucose-Capillary: 107 mg/dL — ABNORMAL HIGH (ref 65–99)

## 2016-09-15 LAB — TROPONIN I

## 2016-09-15 NOTE — Discharge Instructions (Signed)
Please try to stay hydrated.  Make an appointment with your primary care physician for follow-up.

## 2016-09-15 NOTE — ED Notes (Signed)
Pt BIB EMS from home. Pt felt hot all day while out looking at houses. Became hot while in the kitchen at home and had syncopal episode. Witnessed by family and only lasted a few seconds. Pt alert & oriented quickly after and for EMS upon their arrival. Hx HTN, syncope.

## 2016-09-15 NOTE — ED Provider Notes (Signed)
Medical screening examination/treatment/procedure(s) were conducted as a shared visit with non-physician practitioner(s) and myself.  I personally evaluated the patient during the encounter.   EKG Interpretation  Date/Time:  Sunday Sep 15 2016 20:13:57 EDT Ventricular Rate:  85 PR Interval:    QRS Duration: 73 QT Interval:  369 QTC Calculation: 439 R Axis:   23 Text Interpretation:  Sinus rhythm Probable left atrial enlargement Confirmed by Marranda Arakelian  MD, Raeley Gilmore ([id number]) on 09/15/2016 8:55:53 PM     56  year old female who had a brief syncopal event prior to arrival. Became lightheaded and dizzy prior to the episode. Denied any headache or chest measurements of breath. Has history of same. States that she been out outside and there was some concern for possible dehydration. Labs are reassuring and patient stable for discharge   Lacretia Leigh, MD 09/15/16 2315

## 2016-09-15 NOTE — ED Notes (Signed)
ED Provider at bedside. 

## 2016-09-15 NOTE — ED Provider Notes (Signed)
[facility] DEPT Provider Note   CSN: [id number] Arrival date & time: 09/15/16  2007     History   Chief Complaint Chief Complaint  Patient presents with  . Loss of Consciousness    HPI April Gonzalez is a 56 y.o. female.  This is a 56 year old female who presents status post syncopal episode.  She states that she been out hunting for houses.  All day, became overheated and had what to eat or drink and was in the kitchen standing making popcorn when she suddenly got a headache and fell to the floor.  This was witnessed.  She was assisted to the floor by family.  She did not hit her head or her back. She has a history of syncopal episodes and headaches, gustatory hallucinations, seizures, HTN,hyperlipidemia,  chronic daily headache,  Osteoarthritis , anemia She is followed by Dr. Delice Lesch and has a neuro follow-up June 29.  In eyes having any chest pain or shortness of breath.  No recent illnesses such as nausea, vomiting or diarrhea at this time.  She feels back to her baseline      Past Medical History:  Diagnosis Date  . Anemia 05/2012   transfusion  . Fibroids    H/O myomectomy  . H/O uterine prolapse   . Heart murmur   . Heart valve problem    Blockage  . History of blood transfusion   . Hyperlipidemia   . Hypertension   . Seizures ([place])    last seizure age 41 or 10 per pt report  . Thyroid disorder     Patient Active Problem List   Diagnosis Date Noted  . Chronic daily headache 07/03/2016  . Osteoarthritis 10/20/2015  . Neck swelling 10/20/2015  . Hypothyroidism 06/28/2015  . Essential hypertension 06/28/2015  . Faintness 04/28/2015  . Gustatory hallucinations 04/28/2015  . Hyperlipidemia 01/17/2015  . Goiter 01/06/2015  . Low vitamin B12 level 01/06/2015  . Hematochezia 07/14/2014  . Rectal bleeding 06/21/2014  . Numbness and tingling of foot 06/21/2014  . Dense breasts 07/13/2012  . Seizures ([place])   . Fibroids   . Iron deficiency anemia 01/06/2007  .  HEART MURMUR, HX OF 12/04/2006    Past Surgical History:  Procedure Laterality Date  . ANKLE SURGERY     One plate, seven screws in the right ankle   . BLADDER SUSPENSION N/A 12/10/2012   Procedure: TRANSVAGINAL TAPE (TVT) PROCEDURE;  Surgeon: Delice Lesch, MD;  Location: [facility];  Service: Gynecology;  Laterality: N/A;  . CYSTOCELE REPAIR N/A 12/10/2012   Procedure: ANTERIOR REPAIR (CYSTOCELE);  Surgeon: Eldred Manges, MD;  Location: [facility];  Service: Gynecology;  Laterality: N/A;  . CYSTOSCOPY N/A 12/10/2012   Procedure: CYSTOSCOPY;  Surgeon: Delice Lesch, MD;  Location: [facility];  Service: Gynecology;  Laterality: N/A;  . EXCISION VAGINAL CYST    . MYOMECTOMY  82YRS AGO   HYSTEROSCOPIC  . TUBAL LIGATION  10/10/1991  . VAGINAL HYSTERECTOMY N/A 12/10/2012   Procedure: Total Vaginal Hysterectomy;  Surgeon: Eldred Manges, MD;  Location: [facility];  Service: Gynecology;  Laterality: N/A;    OB History    Gravida Para Term Preterm AB Living   2 2 2     2    SAB TAB Ectopic Multiple Live Births                   Home Medications    Prior to Admission medications   Medication Sig Start Date End Date  Taking? Authorizing Provider  aspirin EC 81 MG tablet Take 81 mg by mouth daily as needed (for anti-platelet therapy). Reported on 09/18/2015 01/17/15  Yes Hendricks Limes, MD  cetirizine (ZYRTEC ALLERGY) 10 MG tablet Take 1 tablet (10 mg total) by mouth daily. Patient taking differently: Take 10 mg by mouth daily as needed for allergies.  09/18/15  Yes Debbrah Alar, NP  EPINEPHrine (EPIPEN 2-PAK) 0.3 mg/0.3 mL IJ SOAJ injection Inject 0.3 mLs (0.3 mg total) into the muscle once. If you develop difficulty breathing 09/18/15  Yes Debbrah Alar, NP  ibuprofen (ADVIL,MOTRIN) 200 MG tablet Take 800 mg by mouth daily as needed for moderate pain.   Yes [provider]  levothyroxine (SYNTHROID, LEVOTHROID) 25 MCG tablet TAKE 1 TABLET BY MOUTH DAILY BEFORE BREAKFAST 08/26/16   Yes Nafziger, Tommi Rumps, NP  losartan-hydrochlorothiazide (HYZAAR) 50-12.5 MG tablet Take 1 tablet by mouth daily. 10/20/15  Yes Golden Circle, FNP  nortriptyline (PAMELOR) 10 MG capsule Take 1 capsule at night for 2 weeks, then increase to 2 capsules at night 07/03/16   Cameron Sprang, MD    Family History Family History  Problem Relation Age of Onset  . Heart failure Father   . Diabetes Mother   . High blood pressure Mother   . Arthritis Mother   . Breast cancer Maternal Aunt        in her 37's   . Breast cancer Cousin   . Thyroid disease Sister     Social History Social History  Substance Use Topics  . Smoking status: Never Smoker  . Smokeless tobacco: Never Used  . Alcohol use 0.0 oz/week     Comment: rare     Allergies   Methimazole; Nitroglycerin; Other; Tetracycline hcl; Shrimp [shellfish allergy]; Penicillins; and Terbutaline   Review of Systems Review of Systems  Constitutional: Negative for fever.  Respiratory: Negative for shortness of breath.   Cardiovascular: Negative for chest pain.  Gastrointestinal: Negative for diarrhea and vomiting.  Neurological: Positive for headaches.  All other systems reviewed and are negative.    Physical Exam Updated Vital Signs BP 140/90   Pulse 77   Temp 98.4 F (36.9 C) (Oral)   Resp (!) 21   LMP 12/03/2012   SpO2 97%   Physical Exam  Constitutional: She appears well-developed and well-nourished.  HENT:  Head: Normocephalic.  Eyes: Pupils are equal, round, and reactive to light.  Cardiovascular: Normal rate.   Pulmonary/Chest: Effort normal.  Abdominal: Soft.  Musculoskeletal: Normal range of motion.  Neurological: She is alert.  Skin: Skin is warm.  Psychiatric: She has a normal mood and affect.  Nursing note and vitals reviewed.    ED Treatments / Results  Labs (all labs ordered are listed, but only abnormal results are displayed) Labs Reviewed  BASIC METABOLIC PANEL - Abnormal; Notable for the  following:       Result Value   Glucose, Bld 120 (*)    All other components within normal limits  CBG MONITORING, ED - Abnormal; Notable for the following:    Glucose-Capillary 107 (*)    All other components within normal limits  CBC  URINALYSIS, ROUTINE W REFLEX MICROSCOPIC  TROPONIN I    EKG  EKG Interpretation  Date/Time:  Sunday Sep 15 2016 20:13:57 EDT Ventricular Rate:  85 PR Interval:    QRS Duration: 73 QT Interval:  369 QTC Calculation: 439 R Axis:   23 Text Interpretation:  Sinus rhythm Probable left atrial enlargement Confirmed  by Zenia Resides  MD, [place] ([id number]) on 09/15/2016 8:55:53 PM Also confirmed by Zenia Resides  MD, ANTHONY ([id number]), editor Drema Pry [phone])  on 09/16/2016 7:26:29 AM       Radiology No results found.  Procedures Procedures (including critical care time)  Medications Ordered in ED Medications - No data to display   Initial Impression / Assessment and Plan / ED Course  I have reviewed the triage vital signs and the nursing notes.  Pertinent labs & imaging results that were available during my care of the patient were reviewed by me and considered in my medical decision making (see chart for details).      View of patient's chart shows that she's had a number of syncopal episodes with evaluations in the emergency room and her last MRI of her brain was March 2018 with normal findings .  She does take Synthroid for hypothyroidism has not had any medication changes  Final Clinical Impressions(s) / ED Diagnoses   Final diagnoses:  Syncope, unspecified syncope type    New Prescriptions Discharge Medication List as of 09/15/2016 11:07 PM       Junius Creamer, NP 09/16/16 2001

## 2016-09-16 ENCOUNTER — Telehealth: Payer: Self-pay | Admitting: Adult Health

## 2016-09-16 ENCOUNTER — Encounter: Payer: Self-pay | Admitting: Internal Medicine

## 2016-09-16 ENCOUNTER — Ambulatory Visit (INDEPENDENT_AMBULATORY_CARE_PROVIDER_SITE_OTHER): Payer: BLUE CROSS/BLUE SHIELD | Admitting: Internal Medicine

## 2016-09-16 VITALS — BP 118/82 | HR 92 | Temp 98.3°F | Ht 63.5 in | Wt 201.0 lb

## 2016-09-16 DIAGNOSIS — Z8679 Personal history of other diseases of the circulatory system: Secondary | ICD-10-CM

## 2016-09-16 DIAGNOSIS — R55 Syncope and collapse: Secondary | ICD-10-CM | POA: Diagnosis not present

## 2016-09-16 DIAGNOSIS — I10 Essential (primary) hypertension: Secondary | ICD-10-CM

## 2016-09-16 NOTE — Telephone Encounter (Signed)
Noted  

## 2016-09-16 NOTE — Telephone Encounter (Signed)
Patient Name: April Gonzalez  DOB: 22-Jul-1960    Initial Comment Caller needs to know if she can take blood pressure now since she took another at 1 o clock, headache. 135/99   Nurse Assessment  Nurse: Raphael Gibney, RN, Vera Date/Time (Eastern Time): 09/16/2016 9:22:01 AM  Confirm and document reason for call. If symptomatic, describe symptoms. ---Caller states her BP is 135/99. BP was 210/116 last night. Took BP medication this am. She has a headache. She takes Losartan/HCTZ qd. Wants to know if she can go ahead and take it since she took it last night. Headache is severe in back of her head. Blood sugar 125. She went to the ER via ambulance last night.  Does the patient have any new or worsening symptoms? ---Yes  Will a triage be completed? ---Yes  Related visit to physician within the last 2 weeks? ---No  Does the PT have any chronic conditions? (i.e. diabetes, asthma, etc.) ---Yes  List chronic conditions. ---HTN; thyroid problems  Is the patient pregnant or possibly pregnant? (Ask all females between the ages of 73-55) ---No  Is this a behavioral health or substance abuse call? ---No     Guidelines    Guideline Title Affirmed Question Affirmed Notes  High Blood Pressure [0] Systolic BP >= 141 OR Diastolic >= 90 AND [0] taking BP medications    Final Disposition User   See Physician within 24 Hours [place], RN, Vera    Comments  triage outcome upgraded to see physician within 24 hrs as she call 911 last night and had BP of 210/116 and has headache  appt scheduled for 10:45 am 09/16/2016 with Dr. Shanon Ace   Referrals  REFERRED TO PCP OFFICE   Disagree/Comply: Comply

## 2016-09-16 NOTE — Progress Notes (Signed)
Chief Complaint  Patient presents with  . Hypertension    HPI: April Gonzalez 56 y.o.  PCP NA  Went to ed and send to PCP  For syncope.  Was shopping for houses that day . Was sitting with  Relative  Felt overheated   Felt hot   And then  Went down  .  Not full LOC but near syncope no seizure activiety   eval in ed  Felt stable for ed  Had ekg  Lab  Done  Said bp was up but  In ed  Was ok .  Has missed a good deal of meds forgetting because of the other meds needed  Under care from neuro for numbness and ? Other  CD headaches  Hx seizures as a child not recnelty   Many things  recnetly   A month of gastpin gfor breath . Not feeling good   . No hx of  Recurrent syncope but has fainted before .  Hx of heart murmur. But also.  hx of low hg and needed transfusion.   No hx of arrhythmias?    ED Provider Notes     Medical screening examination/treatment/procedure(s) were conducted as a shared visit with non-physician practitioner(s) and myself.  I personally evaluated the patient during the encounter.       EKG Interpretation  Date/Time:                  Sunday Sep 15 2016 20:13:57 EDT Ventricular Rate:   85 PR Interval:                        QRS Duration:        73 QT Interval:                      369 QTC Calculation:    439 R Axis:                         23 Text Interpretation:  Sinus rhythm Probable left atrial enlargement Confirmed by ALLEN  MD, ANTHONY ([id number]) on 09/15/2016 8:55:53 PM     56  year old female who had a brief syncopal event prior to arrival. Became lightheaded and dizzy prior to the episode. Denied any headache or chest measurements of breath. Has history of same. States that she been out outside and there was some concern for possible dehydration. Labs are reassuring and patient stable for discharge   Lacretia Leigh, MD 09/15/16 2315      ROS: See pertinent positives and negatives per HPI.  Past Medical History:  Diagnosis Date  . Anemia 05/2012   transfusion  . Fibroids    H/O myomectomy  . H/O uterine prolapse   . Heart murmur   . Heart valve problem    Blockage  . History of blood transfusion   . Hyperlipidemia   . Hypertension   . Seizures ([place])    last seizure age 8 or 10 per pt report  . Thyroid disorder     Family History  Problem Relation Age of Onset  . Heart failure Father   . Diabetes Mother   . High blood pressure Mother   . Arthritis Mother   . Breast cancer Maternal Aunt        in her 76's   . Breast cancer Cousin   . Thyroid disease Sister     Social History   Social History  .  Marital status: Married    Spouse name: N/A  . Number of children: 2  . Years of education: 71   Social History Main Topics  . Smoking status: Never Smoker  . Smokeless tobacco: Never Used  . Alcohol use 0.0 oz/week     Comment: rare  . Drug use: No  . Sexual activity: Yes    Birth control/ protection: Surgical     Comment: BTL    Other Topics Concern  . None   Social History Narrative   She works for [employer]    Married   Has 2 children     Outpatient Medications Prior to Visit  Medication Sig Dispense Refill  . aspirin EC 81 MG tablet Take 81 mg by mouth daily as needed (for anti-platelet therapy). Reported on 09/18/2015    . cetirizine (ZYRTEC ALLERGY) 10 MG tablet Take 1 tablet (10 mg total) by mouth daily. (Patient taking differently: Take 10 mg by mouth daily as needed for allergies. ) 30 tablet 0  . EPINEPHrine (EPIPEN 2-PAK) 0.3 mg/0.3 mL IJ SOAJ injection Inject 0.3 mLs (0.3 mg total) into the muscle once. If you develop difficulty breathing 2 Device 0  . ibuprofen (ADVIL,MOTRIN) 200 MG tablet Take 800 mg by mouth daily as needed for moderate pain.    Marland Kitchen levothyroxine (SYNTHROID, LEVOTHROID) 25 MCG tablet TAKE 1 TABLET BY MOUTH DAILY BEFORE BREAKFAST 30 tablet 6  . losartan-hydrochlorothiazide (HYZAAR) 50-12.5 MG tablet Take 1 tablet by mouth daily. 90 tablet 3  . nortriptyline (PAMELOR) 10 MG capsule  Take 1 capsule at night for 2 weeks, then increase to 2 capsules at night 60 capsule 6   No facility-administered medications prior to visit.      EXAM:  BP 118/82 (BP Location: Right Arm, Patient Position: Sitting, Cuff Size: Normal)   Pulse 92   Temp 98.3 F (36.8 C) (Oral)   Ht 5' 3.5" (1.613 m)   Wt 201 lb (91.2 kg)   LMP 12/03/2012   BMI 35.05 kg/m   Body mass index is 35.05 kg/m.  GENERAL: vitals reviewed and listed above, alert, oriented, appears well hydrated and in no acute distress feels hot at times and lay down  Nl cognition  HEENT: atraumatic, conjunctiva  clear, no obvious abnormalities on inspection of external nose and ears NECK: no obvious masses on inspection palpation  LUNGS: clear to auscultation bilaterally, no wheezes, rales or rhonchi, good air movement CV: HRRR, no clubbing cyanosis or  peripheral edema nl cap refill  No murmur noted today  MS: moves all extremities without noticeable focal  abnormality PSYCH: pleasant and cooperative, no obvious depression or anxiety BP Readings from Last 3 Encounters:  09/16/16 118/82  09/15/16 140/90  08/27/16 132/70   Wt Readings from Last 3 Encounters:  09/16/16 201 lb (91.2 kg)  08/27/16 202 lb 12.8 oz (92 kg)  07/03/16 199 lb 6.4 oz (90.4 kg)   Lab Results  Component Value Date   WBC 7.1 09/15/2016   HGB 12.9 09/15/2016   HCT 40.1 09/15/2016   PLT 203 09/15/2016   GLUCOSE 120 (H) 09/15/2016   CHOL 231 (H) 04/11/2016   TRIG 153.0 (H) 04/11/2016   HDL 41.90 04/11/2016   LDLDIRECT 139.0 03/15/2015   LDLCALC 159 (H) 04/11/2016   ALT 19 04/11/2016   AST 12 04/11/2016   NA 137 09/15/2016   K 3.9 09/15/2016   CL 104 09/15/2016   CREATININE 1.00 09/15/2016   BUN 13 09/15/2016   CO2  24 09/15/2016   TSH 2.13 04/11/2016   INR 1.20 05/24/2012   HGBA1C 5.6 06/21/2014     ASSESSMENT AND PLAN:  Discussed the following assessment and plan:  Near syncope - Plan: ECHOCARDIOGRAM COMPLETE  Essential  hypertension - Plan: ECHOCARDIOGRAM COMPLETE  History of cardiac murmur - Plan: ECHOCARDIOGRAM COMPLETE Plan fu   pcp and neuro  Consider echo because of ht  And  Syncope but  But  prob a vagal syndrome  Has been taking ht meds erratically  So try to take reg and fu with  PCP to makes sure not causing low bp also  Pt agrees   Stay hydrated and  Fu with specialists  -Patient advised to return or notify health care team  if symptoms worsen ,persist or new concerns arise.  Patient Instructions  Your exam is  Reassuring  Today   Will order  an echocardiogram to look at chambers and pumping actions   Of heart .  Suspect   They near fainting could be   Heat  Or vagal related .    Then plan ROV  In 2 -3  weeks or as needed  After echo done.       Standley Brooking. Marsheila Alejo M.D.

## 2016-09-16 NOTE — Telephone Encounter (Signed)
Pt calling stating that she went to [facility] ER with a Bp of 210/160 and has a headache was transferred to [facility].

## 2016-09-16 NOTE — Telephone Encounter (Signed)
Patient saw Dr. Regis Bill today.

## 2016-09-16 NOTE — Patient Instructions (Addendum)
Your exam is  Reassuring  Today   Will order  an echocardiogram to look at chambers and pumping actions   Of heart .  Suspect   They near fainting could be   Heat  Or vagal related .    Then plan ROV  In 2 -3  weeks or as needed  After echo done.

## 2016-09-25 ENCOUNTER — Telehealth: Payer: Self-pay | Admitting: Adult Health

## 2016-09-25 MED ORDER — SCOPOLAMINE 1 MG/3DAYS TD PT72: 1.0000 | MEDICATED_PATCH | TRANSDERMAL | 1 refills | Status: DC

## 2016-09-25 NOTE — Telephone Encounter (Signed)
Pt going on a cruise at  2:30 am, would like to know if Dr Regis Bill would call in something for sea sicknesses for her trip in cory's absence   [id number] - [place],  - [address]. AT [address]

## 2016-09-25 NOTE — Telephone Encounter (Signed)
Can try transderm scopolamine patch every 72 hours   But inurance will not cover .  Or otc  Bonine  Dramamine also  I sent in rx to pharmacy

## 2016-09-25 NOTE — Telephone Encounter (Signed)
Spoke with pt and gave her Dr. Regis Bill recommendation. Pt understood and had no further questions

## 2016-09-25 NOTE — Telephone Encounter (Signed)
Please advise 

## 2016-10-18 ENCOUNTER — Ambulatory Visit (HOSPITAL_COMMUNITY): Payer: BLUE CROSS/BLUE SHIELD | Attending: Internal Medicine

## 2016-10-18 ENCOUNTER — Other Ambulatory Visit: Payer: Self-pay

## 2016-10-18 DIAGNOSIS — Z8679 Personal history of other diseases of the circulatory system: Secondary | ICD-10-CM

## 2016-10-18 DIAGNOSIS — I10 Essential (primary) hypertension: Secondary | ICD-10-CM

## 2016-10-18 DIAGNOSIS — R55 Syncope and collapse: Secondary | ICD-10-CM

## 2016-10-31 ENCOUNTER — Telehealth: Payer: Self-pay | Admitting: Adult Health

## 2016-10-31 NOTE — Telephone Encounter (Signed)
° ° °  Pt said the note that was written for her bathroom breaks was lost by her Scientist, research (medical) department and she requesting another note      Fax number [phone]

## 2016-11-01 ENCOUNTER — Ambulatory Visit: Payer: BLUE CROSS/BLUE SHIELD | Admitting: Neurology

## 2016-11-01 DIAGNOSIS — Z029 Encounter for administrative examinations, unspecified: Secondary | ICD-10-CM

## 2016-11-05 NOTE — Telephone Encounter (Signed)
Letter reprinted and faxed. Nothing further needed at this time.

## 2016-11-08 ENCOUNTER — Ambulatory Visit: Payer: BLUE CROSS/BLUE SHIELD | Admitting: Adult Health

## 2016-11-09 ENCOUNTER — Encounter: Payer: Self-pay | Admitting: Family Medicine

## 2016-11-09 ENCOUNTER — Ambulatory Visit (INDEPENDENT_AMBULATORY_CARE_PROVIDER_SITE_OTHER): Payer: BLUE CROSS/BLUE SHIELD | Admitting: Family Medicine

## 2016-11-09 DIAGNOSIS — J0100 Acute maxillary sinusitis, unspecified: Secondary | ICD-10-CM

## 2016-11-09 MED ORDER — AZITHROMYCIN 250 MG PO TABS: ORAL_TABLET | ORAL | 0 refills | Status: DC

## 2016-11-09 NOTE — Assessment & Plan Note (Signed)
Start zithromax but stop nortriptyline in the meantime, while on antibiotics. D/w pt.  With concurrent ETD likely.  Use nasal saline and gently try to pop ears with valsalva.  Nontoxic.  Update [place] as needed.  She agrees.

## 2016-11-09 NOTE — Progress Notes (Signed)
ST.  L ear pain.  L > R sided sx.  Pain with swallowing.  Sx going on for about 1 week ago, worse in the last few days.  Drinking plenty of fluids.  Taking tylenol w/o relief.  No fevers.  No vomiting, no diarrhea.  Stuffy more than runny nose.  Sinus pressure.    Meds, vitals, and allergies reviewed.   ROS: Per HPI unless specifically indicated in ROS section   GEN: nad, alert and oriented HEENT: mucous membranes moist, tm w/o erythema, nasal exam w/ erythema, clear discharge noted,  OP with cobblestoning, L max sinus ttp, not ttp on R NECK: supple w/o LA CV: rrr.   PULM: ctab, no inc wob EXT: no edema

## 2016-11-09 NOTE — Patient Instructions (Addendum)
Start zithromax but stop nortriptyline in the meantime, while on antibiotics. Use nasal saline and gently try to pop your ears.  Take care.  Glad to see you.  Update [place] as needed.

## 2016-11-20 ENCOUNTER — Encounter (HOSPITAL_COMMUNITY): Payer: Self-pay | Admitting: *Deleted

## 2016-11-20 ENCOUNTER — Emergency Department (HOSPITAL_COMMUNITY)
Admission: EM | Admit: 2016-11-20 | Discharge: 2016-11-21 | Disposition: A | Discharge status: Home / Self Care | Payer: BLUE CROSS/BLUE SHIELD | Attending: Emergency Medicine | Admitting: Emergency Medicine

## 2016-11-20 ENCOUNTER — Emergency Department (HOSPITAL_COMMUNITY): Payer: BLUE CROSS/BLUE SHIELD

## 2016-11-20 ENCOUNTER — Telehealth: Payer: Self-pay | Admitting: Adult Health

## 2016-11-20 DIAGNOSIS — R1030 Lower abdominal pain, unspecified: Secondary | ICD-10-CM | POA: Diagnosis present

## 2016-11-20 DIAGNOSIS — I10 Essential (primary) hypertension: Secondary | ICD-10-CM | POA: Insufficient documentation

## 2016-11-20 DIAGNOSIS — E785 Hyperlipidemia, unspecified: Secondary | ICD-10-CM | POA: Diagnosis not present

## 2016-11-20 DIAGNOSIS — Z79899 Other long term (current) drug therapy: Secondary | ICD-10-CM | POA: Diagnosis not present

## 2016-11-20 DIAGNOSIS — R011 Cardiac murmur, unspecified: Secondary | ICD-10-CM | POA: Diagnosis not present

## 2016-11-20 LAB — URINALYSIS, ROUTINE W REFLEX MICROSCOPIC
BILIRUBIN URINE: NEGATIVE
GLUCOSE, UA: NEGATIVE mg/dL
HGB URINE DIPSTICK: NEGATIVE
KETONES UR: NEGATIVE mg/dL
Leukocytes, UA: NEGATIVE
NITRITE: NEGATIVE
PH: 6 (ref 5.0–8.0)
Protein, ur: NEGATIVE mg/dL
SPECIFIC GRAVITY, URINE: 1.004 — AB (ref 1.005–1.030)

## 2016-11-20 LAB — COMPREHENSIVE METABOLIC PANEL
ALBUMIN: 4.1 g/dL (ref 3.5–5.0)
ALT: 25 U/L (ref 14–54)
ANION GAP: 8 (ref 5–15)
AST: 20 U/L (ref 15–41)
Alkaline Phosphatase: 102 U/L (ref 38–126)
BILIRUBIN TOTAL: 0.9 mg/dL (ref 0.3–1.2)
BUN: 12 mg/dL (ref 6–20)
CO2: 26 mmol/L (ref 22–32)
Calcium: 9.4 mg/dL (ref 8.9–10.3)
Chloride: 103 mmol/L (ref 101–111)
Creatinine, Ser: 0.85 mg/dL (ref 0.44–1.00)
GFR calc Af Amer: >60 mL/min (ref >60)
GFR calc non Af Amer: >60 mL/min (ref >60)
GLUCOSE: 107 mg/dL — AB (ref 65–99)
POTASSIUM: 4.3 mmol/L (ref 3.5–5.1)
SODIUM: 137 mmol/L (ref 135–145)
TOTAL PROTEIN: 7.6 g/dL (ref 6.5–8.1)

## 2016-11-20 LAB — CBC
HEMATOCRIT: 42.5 % (ref 36.0–46.0)
HEMOGLOBIN: 13.9 g/dL (ref 12.0–15.0)
MCH: 28.5 pg (ref 26.0–34.0)
MCHC: 32.7 g/dL (ref 30.0–36.0)
MCV: 87.1 fL (ref 78.0–100.0)
Platelets: 227 10*3/uL (ref 150–400)
RBC: 4.88 MIL/uL (ref 3.87–5.11)
RDW: 12.9 % (ref 11.5–15.5)
WBC: 6.8 10*3/uL (ref 4.0–10.5)

## 2016-11-20 LAB — LIPASE, BLOOD: Lipase: 50 U/L (ref 11–51)

## 2016-11-20 MED ORDER — IOPAMIDOL (ISOVUE-300) INJECTION 61%
INTRAVENOUS | Status: AC
  Administered 2016-11-20: 100 mL
  Filled 2016-11-20: qty 100

## 2016-11-20 MED ORDER — MORPHINE SULFATE (PF) 4 MG/ML IV SOLN
4.0000 mg | Freq: Once | INTRAVENOUS | Status: DC
  Filled 2016-11-20: qty 1

## 2016-11-20 MED ORDER — ONDANSETRON HCL 4 MG/2ML IJ SOLN
4.0000 mg | Freq: Once | INTRAMUSCULAR | Status: AC
  Administered 2016-11-20: 4 mg via INTRAVENOUS
  Filled 2016-11-20: qty 2

## 2016-11-20 MED ORDER — DICYCLOMINE HCL 10 MG PO CAPS
10.0000 mg | ORAL_CAPSULE | Freq: Once | ORAL | Status: AC
  Administered 2016-11-21: 10 mg via ORAL
  Filled 2016-11-20: qty 1

## 2016-11-20 MED ORDER — KETOROLAC TROMETHAMINE 15 MG/ML IJ SOLN
15.0000 mg | Freq: Once | INTRAMUSCULAR | Status: AC
  Administered 2016-11-20: 15 mg via INTRAVENOUS
  Filled 2016-11-20: qty 1

## 2016-11-20 MED ORDER — HYDROCODONE-ACETAMINOPHEN 5-325 MG PO TABS
2.0000 | ORAL_TABLET | Freq: Once | ORAL | Status: DC
  Filled 2016-11-20: qty 2

## 2016-11-20 NOTE — ED Triage Notes (Signed)
Pt reports onset today of lower abd pain, nausea and multiple loose stools. Reports that stools are dark in color. No acute distress is noted at triage.

## 2016-11-20 NOTE — Telephone Encounter (Signed)
Pt calling to check to see if the letter from February could be faxed to [place] [phone] Attn: HR.  This was faxed per pt.

## 2016-11-21 ENCOUNTER — Encounter: Payer: Self-pay | Admitting: Adult Health

## 2016-11-21 MED ORDER — DICYCLOMINE HCL 20 MG PO TABS: 20.0000 mg | ORAL_TABLET | Freq: Three times a day (TID) | ORAL | 0 refills | Status: DC | PRN

## 2016-11-21 MED ORDER — ONDANSETRON 4 MG PO TBDP: 4.0000 mg | ORAL_TABLET | Freq: Three times a day (TID) | ORAL | 0 refills | Status: DC | PRN

## 2016-11-21 MED ORDER — HYDROCODONE-ACETAMINOPHEN 5-325 MG PO TABS: 1.0000 | ORAL_TABLET | Freq: Four times a day (QID) | ORAL | 0 refills | Status: DC | PRN

## 2016-11-21 NOTE — ED Provider Notes (Signed)
[facility] DEPT Provider Note   CSN: [id number] Arrival date & time: 11/20/16  1841     History   Chief Complaint Chief Complaint  Patient presents with  . Abdominal Pain  . Diarrhea    HPI April Gonzalez is a 56 y.o. female.  HPI   56 yo F with PMHx as below here with lower abd pain. Pt states that her sx began 2 days ago as gradual onset of progressively worsening aching, throbbing lower abdominal pain. She has had associated increase in stool frequency and nausea, but no vomiting. No sick contacts. Her pain is aching, intermittent, cramp-like and worse with eating. No alleviating factors. No fever or chills. No dysuria or frequency. No vaginal bleeding, discharge, or high risk sexual contacts.  Past Medical History:  Diagnosis Date  . Anemia 05/2012   transfusion  . Fibroids    H/O myomectomy  . H/O uterine prolapse   . Heart murmur   . Heart valve problem    Blockage  . History of blood transfusion   . Hyperlipidemia   . Hypertension   . Seizures ([place])    last seizure age 50 or 10 per pt report  . Thyroid disorder     Patient Active Problem List   Diagnosis Date Noted  . Acute non-recurrent maxillary sinusitis 11/09/2016  . Chronic daily headache 07/03/2016  . Osteoarthritis 10/20/2015  . Neck swelling 10/20/2015  . Hypothyroidism 06/28/2015  . Essential hypertension 06/28/2015  . Faintness 04/28/2015  . Gustatory hallucinations 04/28/2015  . Hyperlipidemia 01/17/2015  . Goiter 01/06/2015  . Low vitamin B12 level 01/06/2015  . Hematochezia 07/14/2014  . Rectal bleeding 06/21/2014  . Numbness and tingling of foot 06/21/2014  . Dense breasts 07/13/2012  . Seizures ([place])   . Fibroids   . Iron deficiency anemia 01/06/2007  . HEART MURMUR, HX OF 12/04/2006    Past Surgical History:  Procedure Laterality Date  . ANKLE SURGERY     One plate, seven screws in the right ankle   . BLADDER SUSPENSION N/A 12/10/2012   Procedure: TRANSVAGINAL TAPE (TVT)  PROCEDURE;  Surgeon: Delice Lesch, MD;  Location: [facility];  Service: Gynecology;  Laterality: N/A;  . CYSTOCELE REPAIR N/A 12/10/2012   Procedure: ANTERIOR REPAIR (CYSTOCELE);  Surgeon: Eldred Manges, MD;  Location: [facility];  Service: Gynecology;  Laterality: N/A;  . CYSTOSCOPY N/A 12/10/2012   Procedure: CYSTOSCOPY;  Surgeon: Delice Lesch, MD;  Location: [facility];  Service: Gynecology;  Laterality: N/A;  . EXCISION VAGINAL CYST    . MYOMECTOMY  38YRS AGO   HYSTEROSCOPIC  . TUBAL LIGATION  10/10/1991  . VAGINAL HYSTERECTOMY N/A 12/10/2012   Procedure: Total Vaginal Hysterectomy;  Surgeon: Eldred Manges, MD;  Location: [facility];  Service: Gynecology;  Laterality: N/A;    OB History    Gravida Para Term Preterm AB Living   2 2 2     2    SAB TAB Ectopic Multiple Live Births                   Home Medications    Prior to Admission medications   Medication Sig Start Date End Date Taking? Authorizing Provider  Ascorbic Acid (VITAMIN C PO) Take 1 tablet by mouth daily.   Yes [provider]  Cyanocobalamin (VITAMIN B 12 PO) Take 1 tablet by mouth daily.   Yes [provider]  EPINEPHrine (EPIPEN 2-PAK) 0.3 mg/0.3 mL IJ SOAJ injection Inject 0.3 mLs (0.3 mg  total) into the muscle once. If you develop difficulty breathing 09/18/15  Yes Debbrah Alar, NP  ibuprofen (ADVIL,MOTRIN) 200 MG tablet Take 800 mg by mouth daily as needed for moderate pain.   Yes [provider]  levothyroxine (SYNTHROID, LEVOTHROID) 25 MCG tablet TAKE 1 TABLET BY MOUTH DAILY BEFORE BREAKFAST Patient taking differently: TAKE 25MG  BY MOUTH DAILY BEFORE BREAKFAST 08/26/16  Yes Nafziger, Tommi Rumps, NP  losartan-hydrochlorothiazide (HYZAAR) 50-12.5 MG tablet Take 1 tablet by mouth daily. 10/20/15  Yes Golden Circle, FNP  cetirizine (ZYRTEC ALLERGY) 10 MG tablet Take 1 tablet (10 mg total) by mouth daily. Patient not taking: Reported on 11/20/2016 09/18/15   Debbrah Alar, NP    dicyclomine (BENTYL) 20 MG tablet Take 1 tablet (20 mg total) by mouth 3 (three) times daily as needed for spasms (abdominal cramps). 11/21/16   Duffy Bruce, MD  HYDROcodone-acetaminophen (NORCO/VICODIN) 5-325 MG tablet Take 1-2 tablets by mouth every 6 (six) hours as needed for severe pain. 11/21/16   Duffy Bruce, MD  nortriptyline (PAMELOR) 10 MG capsule Take 1 capsule at night for 2 weeks, then increase to 2 capsules at night Patient not taking: Reported on 11/20/2016 07/03/16   Claudene Gatliff Sprang, MD  ondansetron (ZOFRAN ODT) 4 MG disintegrating tablet Take 1 tablet (4 mg total) by mouth every 8 (eight) hours as needed for nausea or vomiting. 11/21/16   Duffy Bruce, MD  scopolamine (TRANSDERM-SCOP, 1.5 MG,) 1 MG/3DAYS Place 1 patch (1.5 mg total) onto the skin every 3 (three) days. For motion sickness Patient not taking: Reported on 11/20/2016 09/25/16   Panosh, Standley Brooking, MD    Family History Family History  Problem Relation Age of Onset  . Heart failure Father   . Diabetes Mother   . High blood pressure Mother   . Arthritis Mother   . Breast cancer Maternal Aunt        in her 47's   . Breast cancer Cousin   . Thyroid disease Sister     Social History Social History  Substance Use Topics  . Smoking status: Never Smoker  . Smokeless tobacco: Never Used  . Alcohol use 0.0 oz/week     Comment: rare     Allergies   Methimazole; Nitroglycerin; Other; Tetracycline hcl; Shrimp [shellfish allergy]; Penicillins; and Terbutaline   Review of Systems Review of Systems  Constitutional: Negative for chills and fever.  HENT: Negative for congestion, rhinorrhea and sore throat.   Eyes: Negative for visual disturbance.  Respiratory: Negative for cough, shortness of breath and wheezing.   Cardiovascular: Negative for chest pain and leg swelling.  Gastrointestinal: Positive for abdominal pain, diarrhea and nausea. Negative for vomiting.  Genitourinary: Negative for dysuria, flank  pain, vaginal bleeding and vaginal discharge.  Musculoskeletal: Negative for neck pain.  Skin: Negative for rash.  Allergic/Immunologic: Negative for immunocompromised state.  Neurological: Negative for syncope and headaches.  Hematological: Does not bruise/bleed easily.  All other systems reviewed and are negative.    Physical Exam Updated Vital Signs BP 129/90   Pulse 64   Temp 97.8 F (36.6 C) (Oral)   Resp 18   LMP 12/03/2012   SpO2 97%   Physical Exam  Constitutional: She is oriented to person, place, and time. She appears well-developed and well-nourished. No distress.  HENT:  Head: Normocephalic and atraumatic.  Eyes: Conjunctivae are normal.  Neck: Neck supple.  Cardiovascular: Normal rate, regular rhythm and normal heart sounds.  Exam reveals no friction rub.   No murmur  heard. Pulmonary/Chest: Effort normal and breath sounds normal. No respiratory distress. She has no wheezes. She has no rales.  Abdominal: Soft. Bowel sounds are normal. She exhibits no distension. There is tenderness (mild, bilateral lower quadrants, worse in RLQ). There is no rebound and no guarding.  Musculoskeletal: She exhibits no edema.  Neurological: She is alert and oriented to person, place, and time. She exhibits normal muscle tone.  Skin: Skin is warm. Capillary refill takes less than 2 seconds.  Psychiatric: She has a normal mood and affect.  Nursing note and vitals reviewed.    ED Treatments / Results  Labs (all labs ordered are listed, but only abnormal results are displayed) Labs Reviewed  COMPREHENSIVE METABOLIC PANEL - Abnormal; Notable for the following:       Result Value   Glucose, Bld 107 (*)    All other components within normal limits  URINALYSIS, ROUTINE W REFLEX MICROSCOPIC - Abnormal; Notable for the following:    Color, Urine COLORLESS (*)    Specific Gravity, Urine 1.004 (*)    All other components within normal limits  LIPASE, BLOOD  CBC    EKG  EKG  Interpretation None       Radiology Ct Abdomen Pelvis W Contrast  Result Date: 11/20/2016 CLINICAL DATA:  Initial evaluation for acute lower abdominal pain with nausea and diarrhea. EXAM: CT ABDOMEN AND PELVIS WITH CONTRAST TECHNIQUE: Multidetector CT imaging of the abdomen and pelvis was performed using the standard protocol following bolus administration of intravenous contrast. CONTRAST:  18mL ISOVUE-300 IOPAMIDOL (ISOVUE-300) INJECTION 61% COMPARISON:  Prior CT from 08/03/2014. FINDINGS: Lower chest: Hazy subsegmental atelectasis present dependently within the visualized lung bases. Visualized lungs are otherwise clear. Hepatobiliary: Liver demonstrates a normal contrast enhanced appearance. Gallbladder within normal limits. No biliary dilatation. Pancreas: Pancreas within normal limits. Spleen: Spleen within normal limits. Adrenals/Urinary Tract: Adrenal glands are normal. Kidneys equal in size with symmetric enhancement. 7 mm nonobstructive calculus present within the lower pole left kidney. No other radiopaque calculi identified. No hydronephrosis. subcentimeter hypodensity at the lower pole the right kidney too small the characterize, but statistically likely reflects a small cyst. No focal enhancing renal mass. No hydroureter. Bladder within normal limits. Stomach/Bowel: Stomach within normal limits. No evidence for bowel obstruction. Appendix is normal. No acute inflammatory changes seen about the bowels. Vascular/Lymphatic: Intra-abdominal aorta of normal caliber. Normal intravascular enhancement seen throughout the intra-abdominal aorta and its branch vessels. No adenopathy. Reproductive: Uterus is absent.  Ovaries within normal limits. Other: No free air or fluid. Diastasis of the rectus abdominus musculature without frank hernia. Musculoskeletal: No acute osseus abnormality. No worrisome lytic or blastic osseous lesions. IMPRESSION: 1. No CT evidence for acute intra-abdominal or pelvic  process. 2. Nonobstructive left renal nephrolithiasis. 3. Status post hysterectomy. Electronically Signed   By: Jeannine Boga M.D.   On: 11/20/2016 23:27    Procedures Procedures (including critical care time)  Medications Ordered in ED Medications  ondansetron (ZOFRAN) injection 4 mg (4 mg Intravenous Given 11/20/16 2151)  ketorolac (TORADOL) 15 MG/ML injection 15 mg (15 mg Intravenous Given 11/20/16 2151)  iopamidol (ISOVUE-300) 61 % injection (100 mLs  Contrast Given 11/20/16 2235)  dicyclomine (BENTYL) capsule 10 mg (10 mg Oral Given 11/21/16 0008)     Initial Impression / Assessment and Plan / ED Course  I have reviewed the triage vital signs and the nursing notes.  Pertinent labs & imaging results that were available during my care of the patient were reviewed by  me and considered in my medical decision making (see chart for details).     56 yo F here with mild lower abdominal cramping, nausea, and diarrhea/loose stools. Suspect viral GI illness, food-borne illness, or possibly IBS. Lab work is very reassuring - normal WBC, normal renal function and liver function. UA is without signs of UTI. She is s/p hysterectomy - doubt PID, TOA. CT scan obtained and is negative for appy or other abnormality. She feels improved with IVF, analgesia.  Given reassuring labs, vitals, and imaging, will d/c with supportive care, close PCP f/u, and good return precautions.  Final Clinical Impressions(s) / ED Diagnoses   Final diagnoses:  Lower abdominal pain    New Prescriptions Discharge Medication List as of 11/21/2016 12:32 AM    START taking these medications   Details  dicyclomine (BENTYL) 20 MG tablet Take 1 tablet (20 mg total) by mouth 3 (three) times daily as needed for spasms (abdominal cramps)., Starting Thu 11/21/2016, Print    HYDROcodone-acetaminophen (NORCO/VICODIN) 5-325 MG tablet Take 1-2 tablets by mouth every 6 (six) hours as needed for severe pain., Starting Thu  11/21/2016, Print    ondansetron (ZOFRAN ODT) 4 MG disintegrating tablet Take 1 tablet (4 mg total) by mouth every 8 (eight) hours as needed for nausea or vomiting., Starting Thu 11/21/2016, Print         Duffy Bruce, MD 11/21/16 1228

## 2016-11-22 ENCOUNTER — Encounter: Payer: Self-pay | Admitting: Gastroenterology

## 2016-11-22 ENCOUNTER — Encounter: Payer: Self-pay | Admitting: Adult Health

## 2016-11-22 ENCOUNTER — Ambulatory Visit (INDEPENDENT_AMBULATORY_CARE_PROVIDER_SITE_OTHER): Payer: BLUE CROSS/BLUE SHIELD | Admitting: Adult Health

## 2016-11-22 VITALS — BP 140/88 | Temp 97.6°F | Wt 206.0 lb

## 2016-11-22 DIAGNOSIS — N200 Calculus of kidney: Secondary | ICD-10-CM

## 2016-11-22 DIAGNOSIS — I10 Essential (primary) hypertension: Secondary | ICD-10-CM

## 2016-11-22 DIAGNOSIS — Z1211 Encounter for screening for malignant neoplasm of colon: Secondary | ICD-10-CM | POA: Diagnosis not present

## 2016-11-22 DIAGNOSIS — R634 Abnormal weight loss: Secondary | ICD-10-CM

## 2016-11-22 MED ORDER — TAMSULOSIN HCL 0.4 MG PO CAPS: 0.4000 mg | ORAL_CAPSULE | Freq: Every day | ORAL | 3 refills | Status: DC

## 2016-11-22 MED ORDER — LOSARTAN POTASSIUM-HCTZ 50-12.5 MG PO TABS: 1.0000 | ORAL_TABLET | Freq: Every day | ORAL | 3 refills | Status: DC

## 2016-11-22 NOTE — Progress Notes (Signed)
Subjective:    Patient ID: April Gonzalez, female    DOB: 02-02-1961, 56 y.o.   MRN: [id number]  HPI  56 year old female who  has a past medical history of Anemia (05/2012); Fibroids; H/O uterine prolapse; Heart murmur; Heart valve problem; History of blood transfusion; Hyperlipidemia; Hypertension; Seizures ([place]); and Thyroid disorder.  She presents to the office today for follow up after being seen in the ER two days ago. Per ER note:   56 yo F with PMHx as below here with lower abd pain. Pt states that her sx began 2 days ago as gradual onset of progressively worsening aching, throbbing lower abdominal pain. She has had associated increase in stool frequency and nausea, but no vomiting. No sick contacts. Her pain is aching, intermittent, cramp-like and worse with eating. No alleviating factors. No fever or chills. No dysuria or frequency. No vaginal bleeding, discharge, or high risk sexual contacts.  Labs, vitals and CT scan were all reassuring ( she did have a non obstructive left renal nephrolithiasis)  . She responded well to IV fluids and analgesia. She was prescribed Bentyl, Norco, and Zofran ODT.   Today in the office she reports that she continues to have lower abdominal pain but is not as bad as it was when she was in the ER. She denies any nausea, vomiting, or diarrhea.   She also reports that she has stopped taking her blood pressure medication due to " I keep losing it"   She would also like to get reschedule her colonoscopy   Additionally, she is worried about weight gain. She reports eating healthy and biking daily, yet despite this she continues to gain weight . She would like to be referred to medical weight loss.   Review of Systems See HPI   Past Medical History:  Diagnosis Date  . Anemia 05/2012   transfusion  . Fibroids    H/O myomectomy  . H/O uterine prolapse   . Heart murmur   . Heart valve problem    Blockage  . History of blood transfusion   .  Hyperlipidemia   . Hypertension   . Seizures ([place])    last seizure age 82 or 10 per pt report  . Thyroid disorder     Social History   Social History  . Marital status: Married    Spouse name: N/A  . Number of children: 2  . Years of education: 15   Occupational History  . Not on file.   Social History Main Topics  . Smoking status: Never Smoker  . Smokeless tobacco: Never Used  . Alcohol use 0.0 oz/week     Comment: rare  . Drug use: No  . Sexual activity: Yes    Birth control/ protection: Surgical     Comment: BTL    Other Topics Concern  . Not on file   Social History Narrative   She works for [employer]    Married   Has 2 children     Past Surgical History:  Procedure Laterality Date  . ANKLE SURGERY     One plate, seven screws in the right ankle   . BLADDER SUSPENSION N/A 12/10/2012   Procedure: TRANSVAGINAL TAPE (TVT) PROCEDURE;  Surgeon: Delice Lesch, MD;  Location: [facility];  Service: Gynecology;  Laterality: N/A;  . CYSTOCELE REPAIR N/A 12/10/2012   Procedure: ANTERIOR REPAIR (CYSTOCELE);  Surgeon: Eldred Manges, MD;  Location: [facility];  Service: Gynecology;  Laterality: N/A;  .  CYSTOSCOPY N/A 12/10/2012   Procedure: CYSTOSCOPY;  Surgeon: Delice Lesch, MD;  Location: [facility];  Service: Gynecology;  Laterality: N/A;  . EXCISION VAGINAL CYST    . MYOMECTOMY  60YRS AGO   HYSTEROSCOPIC  . TUBAL LIGATION  10/10/1991  . VAGINAL HYSTERECTOMY N/A 12/10/2012   Procedure: Total Vaginal Hysterectomy;  Surgeon: Eldred Manges, MD;  Location: [facility];  Service: Gynecology;  Laterality: N/A;    Family History  Problem Relation Age of Onset  . Heart failure Father   . Diabetes Mother   . High blood pressure Mother   . Arthritis Mother   . Breast cancer Maternal Aunt        in her 25's   . Breast cancer Cousin   . Thyroid disease Sister     Allergies  Allergen Reactions  . Methimazole Anaphylaxis  . Nitroglycerin Other (See Comments)    Other  reaction(s): Other sweating  . Other Other (See Comments)    Seasonal / dog hair   . Tetracycline Hcl Other (See Comments)    REACTION: elevated blood pressure  . Shrimp [Shellfish Allergy] Itching  . Penicillins Other (See Comments)    Has patient had a PCN reaction causing immediate rash, facial/tongue/throat swelling, SOB or lightheadedness with hypotension:  Unknown 2 years ago Has patient had a PCN reaction causing severe rash involving mucus membranes or skin necrosis: No Has patient had a PCN reaction that required hospitalization No Has patient had a PCN reaction occurring within the last 10 years: Yes If all of the above answers are "NO", then may proceed with Cephalosporin use.   . Terbutaline Other (See Comments)    Current Outpatient Prescriptions on File Prior to Visit  Medication Sig Dispense Refill  . Ascorbic Acid (VITAMIN C PO) Take 1 tablet by mouth daily.    . Cyanocobalamin (VITAMIN B 12 PO) Take 1 tablet by mouth daily.    Marland Kitchen dicyclomine (BENTYL) 20 MG tablet Take 1 tablet (20 mg total) by mouth 3 (three) times daily as needed for spasms (abdominal cramps). 30 tablet 0  . EPINEPHrine (EPIPEN 2-PAK) 0.3 mg/0.3 mL IJ SOAJ injection Inject 0.3 mLs (0.3 mg total) into the muscle once. If you develop difficulty breathing 2 Device 0  . ibuprofen (ADVIL,MOTRIN) 200 MG tablet Take 800 mg by mouth daily as needed for moderate pain.    Marland Kitchen levothyroxine (SYNTHROID, LEVOTHROID) 25 MCG tablet TAKE 1 TABLET BY MOUTH DAILY BEFORE BREAKFAST (Patient taking differently: TAKE 25MG  BY MOUTH DAILY BEFORE BREAKFAST) 30 tablet 6  . ondansetron (ZOFRAN ODT) 4 MG disintegrating tablet Take 1 tablet (4 mg total) by mouth every 8 (eight) hours as needed for nausea or vomiting. 16 tablet 0   No current facility-administered medications on file prior to visit.     BP 140/88   Temp 97.6 F (36.4 C) (Oral)   Wt 206 lb (93.4 kg)   LMP 12/03/2012   BMI 35.92 kg/m       Objective:    Physical Exam  Constitutional: She is oriented to person, place, and time. She appears well-developed and well-nourished. No distress.  Cardiovascular: Normal rate, regular rhythm, normal heart sounds and intact distal pulses.  Exam reveals no gallop and no friction rub.   No murmur heard. Pulmonary/Chest: Effort normal and breath sounds normal. No respiratory distress. She has no wheezes. She has no rales. She exhibits no tenderness.  Abdominal: Soft. Bowel sounds are normal. She exhibits no distension and no mass.  There is no tenderness. There is no rebound and no guarding.  Neurological: She is alert and oriented to person, place, and time.  Skin: Skin is warm and dry. No rash noted. She is not diaphoretic. No erythema. No pallor.  Psychiatric: She has a normal mood and affect. Her behavior is normal. Judgment and thought content normal.  Nursing note and vitals reviewed.     Assessment & Plan:  1. Essential hypertension  - losartan-hydrochlorothiazide (HYZAAR) 50-12.5 MG tablet; Take 1 tablet by mouth daily.  Dispense: 90 tablet; Refill: 3  2. Colon cancer screening  - Ambulatory referral to Gastroenterology  3. Kidney stone  - tamsulosin (FLOMAX) 0.4 MG CAPS capsule; Take 1 capsule (0.4 mg total) by mouth daily.  Dispense: 30 capsule; Refill: 3  4. Weight loss  - Amb Ref to Medical Weight Management  [name], NP

## 2016-11-29 ENCOUNTER — Telehealth: Payer: Self-pay | Admitting: Family Medicine

## 2016-11-29 NOTE — Telephone Encounter (Signed)
Received a fax from [facility] dated 11/28/16.  Pt states she has a rash on her hand that looks like cigarette burns, stiffness in the neck and left of her face.  Fax states she cannot receive calls until after 3:30 PM.  I tried to reach her on cell and home phone.  Left a message on both for her to return my call.

## 2016-11-29 NOTE — Telephone Encounter (Signed)
Pt returned my call.  States her rash is worsening and spreading.  Would like to be seen.  No openings at this time due to the hour.  Scheduled on Saturday clinic.  Pt has no SOB or other sx at this time.

## 2016-11-30 ENCOUNTER — Ambulatory Visit (INDEPENDENT_AMBULATORY_CARE_PROVIDER_SITE_OTHER): Payer: BLUE CROSS/BLUE SHIELD | Admitting: Family Medicine

## 2016-11-30 ENCOUNTER — Encounter: Payer: Self-pay | Admitting: Family Medicine

## 2016-11-30 VITALS — BP 120/80 | HR 70 | Temp 98.5°F | Resp 14 | Ht 63.5 in | Wt 205.0 lb

## 2016-11-30 DIAGNOSIS — R21 Rash and other nonspecific skin eruption: Secondary | ICD-10-CM | POA: Diagnosis not present

## 2016-11-30 MED ORDER — CLOBETASOL PROPIONATE 0.05 % EX CREA: 1.0000 "application " | TOPICAL_CREAM | Freq: Two times a day (BID) | CUTANEOUS | 0 refills | Status: DC

## 2016-11-30 MED ORDER — CETIRIZINE HCL 10 MG PO TABS: 10.0000 mg | ORAL_TABLET | Freq: Every day | ORAL | Status: DC

## 2016-11-30 NOTE — Progress Notes (Signed)
Pre visit review using our clinic review tool, if applicable. No additional management support is needed unless otherwise documented below in the visit note. 

## 2016-11-30 NOTE — Patient Instructions (Addendum)
I think we have 2 different rashes going on: 1. hand rash is eczema type rash - treat with stronger steroid cream for 2 weeks then hold. Continue moisturizing cream throughout the day.  2. Chest rash seems allergic rash - treat with zyrtec 10mg  daily, benadryl 25mg  at night as needed.  Let us know if not improving with this.   Hand Dermatitis Hand dermatitis is a skin condition that causes small, itchy, raised dots or fluid-filled blisters to form over the palms of the hands. This condition may also be called hand eczema. What are the causes? The cause of this condition is not known. What increases the risk? This condition is more likely to develop in people who have a history of allergies, such as:  Hay fever.  Allergic asthma.  An allergy to latex.  Chemical exposure, injuries, and environmental irritants can make hand dermatitis worse. Washing your hands too often can remove natural oils, which can dry out the skin and contribute to outbreaks of this condition. What are the signs or symptoms? The most common symptom of this condition is intense itchiness. Cracks or grooves (fissures) on the fingers can also develop. Affected areas can be painful, especially areas where large blisters have formed. How is this diagnosed? This condition is diagnosed with a medical history and physical exam. How is this treated? This condition is treated with medicines, including:  Steroid creams and ointments.  Oral steroid medicines.  Antibiotic medicines. These are prescribed if you have an infection.  Antihistamine medicines. These help to reduce itchiness.  Follow these instructions at home:  Take or apply over-the-counter and prescription medicines only as told by your health care provider.  If you were prescribed an antibiotic medicine, use it as told by your health care provider. Do not stop using the antibiotic even if you start to feel better.  Avoid washing your hands more often than  necessary.  Avoid using harsh chemicals on your hands.  Wear protective gloves when you handle products that can irritate your skin.  Keep all follow-up visits as told by your health care provider. This is important. Contact a health care provider if:  Your rash does not improve during the first week of treatment.  Your rash is red or tender.  Your rash has pus coming from it.  Your rash spreads. This information is not intended to replace advice given to you by your health care provider. Make sure you discuss any questions you have with your health care provider. Document Released: 04/22/2005 Document Revised: 09/28/2015 Document Reviewed: 11/04/2014 Elsevier Interactive Patient Education  [employer].

## 2016-11-30 NOTE — Assessment & Plan Note (Signed)
Anticipate 2 separate rashes: 1. Dyshidrotic eczema of L hand - rec treat with clobetasol twice daily for 2 wks then off. Continue regular moisturizing. Handout provided. 2. Rash on trunk - ?allergic possibly to recently started vitamin C or B12 - rec hold, start cetirizine 10mg  daily and benadryl 25mg  nightly prn.  Update if not improving with treatment.

## 2016-11-30 NOTE — Progress Notes (Signed)
BP 120/80 (BP Location: Left Arm, Patient Position: Sitting, Cuff Size: Large)   Pulse 70   Temp 98.5 F (36.9 C) (Oral)   Resp 14   Ht 5' 3.5" (1.613 m)   Wt 205 lb (93 kg)   LMP 12/03/2012   SpO2 98%   BMI 35.74 kg/m    CC: rash Subjective:    Patient ID: April Gonzalez, female    DOB: 04/26/1961, 56 y.o.   MRN: [id number]  HPI: April Gonzalez is a 56 y.o. female presenting on 11/30/2016 for Rash (and itching all over; red spots appeared a few days ago )   1.88mo h/o rash on L hand - at base of 4th digit, palmar side, as well as dorsally, started after cruise to [place]. Treated with triamcinolone which helped. Over last 3-4 days noticing rash on chest around neckline - smooth red patches - itchy.   No new lotions, detergents, soaps or shampoos. She uses Dove. No new medicines.  She has started taking b12 and vit C 2 wks ago.  She has been using steroid cream regularly after washing hands at work  Seen earlier this month with sinusitis, treated with zpack.  Lab Results  Component Value Date   TSH 2.13 04/11/2016    Relevant past medical, surgical, family and social history reviewed and updated as indicated. Interim medical history since our last visit reviewed. Allergies and medications reviewed and updated. Outpatient Medications Prior to Visit  Medication Sig Dispense Refill  . Ascorbic Acid (VITAMIN C PO) Take 1 tablet by mouth daily.    Marland Kitchen azithromycin (ZITHROMAX) 250 MG tablet 2 TABS A DAY FOR 1 DAY AND THEN 1 A DAY FOR 4 DAYS.  0  . Cyanocobalamin (VITAMIN B 12 PO) Take 1 tablet by mouth daily.    Marland Kitchen dicyclomine (BENTYL) 20 MG tablet Take 1 tablet (20 mg total) by mouth 3 (three) times daily as needed for spasms (abdominal cramps). 30 tablet 0  . EPINEPHrine (EPIPEN 2-PAK) 0.3 mg/0.3 mL IJ SOAJ injection Inject 0.3 mLs (0.3 mg total) into the muscle once. If you develop difficulty breathing 2 Device 0  . ibuprofen (ADVIL,MOTRIN) 200 MG tablet Take 800 mg by mouth daily as  needed for moderate pain.    Marland Kitchen levothyroxine (SYNTHROID, LEVOTHROID) 25 MCG tablet TAKE 1 TABLET BY MOUTH DAILY BEFORE BREAKFAST (Patient taking differently: TAKE 25MG  BY MOUTH DAILY BEFORE BREAKFAST) 30 tablet 6  . losartan-hydrochlorothiazide (HYZAAR) 50-12.5 MG tablet Take 1 tablet by mouth daily. 90 tablet 3  . ondansetron (ZOFRAN ODT) 4 MG disintegrating tablet Take 1 tablet (4 mg total) by mouth every 8 (eight) hours as needed for nausea or vomiting. 16 tablet 0  . tamsulosin (FLOMAX) 0.4 MG CAPS capsule Take 1 capsule (0.4 mg total) by mouth daily. 30 capsule 3   No facility-administered medications prior to visit.      Per HPI unless specifically indicated in ROS section below Review of Systems     Objective:    BP 120/80 (BP Location: Left Arm, Patient Position: Sitting, Cuff Size: Large)   Pulse 70   Temp 98.5 F (36.9 C) (Oral)   Resp 14   Ht 5' 3.5" (1.613 m)   Wt 205 lb (93 kg)   LMP 12/03/2012   SpO2 98%   BMI 35.74 kg/m   Wt Readings from Last 3 Encounters:  11/30/16 205 lb (93 kg)  11/22/16 206 lb (93.4 kg)  11/09/16 204 lb (92.5 kg)    Physical  Exam  Constitutional: She appears well-developed and well-nourished. No distress.  Skin: Rash noted.  L hand rash - rough scaly rash lateral L ring finger into base of digit at MCP, pruritic. 2 separate small rough patches on dorsal L hand Faintly erythematous smooth patches on chest and upper abdomen  Nursing note and vitals reviewed.  Lab Results  Component Value Date   TSH 2.13 04/11/2016       Assessment & Plan:   Problem List Items Addressed This Visit    Skin rash - Primary    Anticipate 2 separate rashes: 1. Dyshidrotic eczema of L hand - rec treat with clobetasol twice daily for 2 wks then off. Continue regular moisturizing. Handout provided. 2. Rash on trunk - ?allergic possibly to recently started vitamin C or B12 - rec hold, start cetirizine 10mg  daily and benadryl 25mg  nightly prn.  Update if not  improving with treatment.           Follow up plan: No Follow-up on file.  Ria Bush, MD

## 2016-12-09 MED ORDER — CLOBETASOL PROPIONATE 0.05 % EX CREA: 1.0000 "application " | TOPICAL_CREAM | Freq: Two times a day (BID) | CUTANEOUS | 0 refills | Status: DC

## 2016-12-09 NOTE — Addendum Note (Signed)
Addended by: Modena Nunnery on: 12/09/2016 09:29 AM   Modules accepted: Orders

## 2016-12-11 ENCOUNTER — Telehealth: Payer: Self-pay

## 2016-12-11 NOTE — Telephone Encounter (Signed)
Received PA request for Clobetasol. PA submitted & pending. Key: Ruffin Frederick

## 2016-12-13 ENCOUNTER — Other Ambulatory Visit: Payer: Self-pay | Admitting: Adult Health

## 2016-12-13 MED ORDER — TRIAMCINOLONE ACETONIDE 0.5 % EX OINT: 1.0000 "application " | TOPICAL_OINTMENT | Freq: Two times a day (BID) | CUTANEOUS | 1 refills | Status: DC

## 2016-12-13 NOTE — Telephone Encounter (Signed)
PA denied.   Alternative medications:  Augmented betamethasone 0.05% gel/ointment Clobetasol 0.05% emollient-cream/ointment/solution Halobetasol 0.05% cream/ointment

## 2016-12-19 ENCOUNTER — Telehealth: Payer: Self-pay | Admitting: Adult Health

## 2016-12-19 NOTE — Telephone Encounter (Signed)
Do not see that pt should repeat TSH.  Last was normal. Please advise.

## 2016-12-19 NOTE — Telephone Encounter (Signed)
Advised pt of April Gonzalez message Pt very upset she cannot get her tsh lab today. Asked pt is something wrong, and she said she blowing up around her neck and throat. Pt states if she doesn't have a thyroid issue then why is she on thyroid medicine? Advised pt the best thing to do is come see April Gonzalez and be accessed. Pt agreed and scheduled for tomorrow,  Friday am at 8:30  Nothing further needed.

## 2016-12-19 NOTE — Telephone Encounter (Signed)
She has had a normal thyroid panel done within in the year. There is no indication to do another one. She is due for repeat in December

## 2016-12-19 NOTE — Telephone Encounter (Signed)
Pt would like/needs to have her thyroid checked. Ok to schedule labs?

## 2016-12-19 NOTE — Telephone Encounter (Signed)
[place] notified.  Noted.  Will now close note.

## 2016-12-20 ENCOUNTER — Ambulatory Visit (INDEPENDENT_AMBULATORY_CARE_PROVIDER_SITE_OTHER): Payer: BLUE CROSS/BLUE SHIELD | Admitting: Adult Health

## 2016-12-20 ENCOUNTER — Encounter: Payer: Self-pay | Admitting: Adult Health

## 2016-12-20 VITALS — BP 140/84 | Temp 97.9°F | Wt 206.0 lb

## 2016-12-20 DIAGNOSIS — E039 Hypothyroidism, unspecified: Secondary | ICD-10-CM | POA: Diagnosis not present

## 2016-12-20 NOTE — Patient Instructions (Addendum)
Stop thyroid medication   Return in 3 months for recheck and we will order ultra sound of neck at that time

## 2016-12-20 NOTE — Progress Notes (Signed)
Subjective:    Patient ID: April Gonzalez, female    DOB: 01-23-1961, 56 y.o.   MRN: [id number]  HPI  56 year old female who  has a past medical history of Anemia (05/2012); Fibroids; H/O uterine prolapse; Heart murmur; Heart valve problem; History of blood transfusion; Hyperlipidemia; Hypertension; Seizures ([place]); and Thyroid disorder. She presents to the office today for the complaint of feeling as though " there is something wrong with my thyroid". She feels as though her dose of synthroid is not correct. Her biggest complaint is that of slow weight gai despite exercising on a regular basis and eating healthy.   Looking through old records it appears she was placed on Synthroid in 2016 by her previous provider. Her TSH levels go back 4 years ago and have been normal every time. [place] of  Neck in 2017 showed:  IMPRESSION: Stable thyroid ultrasound demonstrating mildly enlarged gland with diffusely heterogeneous parenchyma containing multiple tiny cystic areas. Findings are suggestive of chronic thyroiditis.   Review of Systems  Constitutional: Positive for fatigue and unexpected weight change.  Respiratory: Negative.   Cardiovascular: Negative.   Neurological: Negative.   All other systems reviewed and are negative.  Past Medical History:  Diagnosis Date  . Anemia 05/2012   transfusion  . Fibroids    H/O myomectomy  . H/O uterine prolapse   . Heart murmur   . Heart valve problem    Blockage  . History of blood transfusion   . Hyperlipidemia   . Hypertension   . Seizures ([place])    last seizure age 48 or 10 per pt report  . Thyroid disorder     Social History   Social History  . Marital status: Married    Spouse name: N/A  . Number of children: 2  . Years of education: 15   Occupational History  . Not on file.   Social History Main Topics  . Smoking status: Never Smoker  . Smokeless tobacco: Never Used  . Alcohol use 0.0 oz/week     Comment: rare  . Drug use: No  .  Sexual activity: Yes    Birth control/ protection: Surgical     Comment: BTL    Other Topics Concern  . Not on file   Social History Narrative   She works for [employer]    Married   Has 2 children     Past Surgical History:  Procedure Laterality Date  . ANKLE SURGERY     One plate, seven screws in the right ankle   . BLADDER SUSPENSION N/A 12/10/2012   Procedure: TRANSVAGINAL TAPE (TVT) PROCEDURE;  Surgeon: Delice Lesch, MD;  Location: [facility];  Service: Gynecology;  Laterality: N/A;  . CYSTOCELE REPAIR N/A 12/10/2012   Procedure: ANTERIOR REPAIR (CYSTOCELE);  Surgeon: Eldred Manges, MD;  Location: [facility];  Service: Gynecology;  Laterality: N/A;  . CYSTOSCOPY N/A 12/10/2012   Procedure: CYSTOSCOPY;  Surgeon: Delice Lesch, MD;  Location: [facility];  Service: Gynecology;  Laterality: N/A;  . EXCISION VAGINAL CYST    . MYOMECTOMY  52YRS AGO   HYSTEROSCOPIC  . TUBAL LIGATION  10/10/1991  . VAGINAL HYSTERECTOMY N/A 12/10/2012   Procedure: Total Vaginal Hysterectomy;  Surgeon: Eldred Manges, MD;  Location: [facility];  Service: Gynecology;  Laterality: N/A;    Family History  Problem Relation Age of Onset  . Heart failure Father   . Diabetes Mother   . High blood pressure Mother   .  Arthritis Mother   . Breast cancer Maternal Aunt        in her 71's   . Breast cancer Cousin   . Thyroid disease Sister     Allergies  Allergen Reactions  . Methimazole Anaphylaxis  . Nitroglycerin Other (See Comments)    Other reaction(s): Other sweating  . Other Other (See Comments)    Seasonal / dog hair   . Tetracycline Hcl Other (See Comments)    REACTION: elevated blood pressure  . Shrimp [Shellfish Allergy] Itching  . Penicillins Other (See Comments)    Has patient had a PCN reaction causing immediate rash, facial/tongue/throat swelling, SOB or lightheadedness with hypotension:  Unknown 2 years ago Has patient had a PCN reaction causing severe rash involving mucus membranes or  skin necrosis: No Has patient had a PCN reaction that required hospitalization No Has patient had a PCN reaction occurring within the last 10 years: Yes If all of the above answers are "NO", then may proceed with Cephalosporin use.   . Terbutaline Other (See Comments)    Current Outpatient Prescriptions on File Prior to Visit  Medication Sig Dispense Refill  . EPINEPHrine (EPIPEN 2-PAK) 0.3 mg/0.3 mL IJ SOAJ injection Inject 0.3 mLs (0.3 mg total) into the muscle once. If you develop difficulty breathing 2 Device 0  . ibuprofen (ADVIL,MOTRIN) 200 MG tablet Take 800 mg by mouth daily as needed for moderate pain.    Marland Kitchen levothyroxine (SYNTHROID, LEVOTHROID) 25 MCG tablet TAKE 1 TABLET BY MOUTH DAILY BEFORE BREAKFAST (Patient taking differently: TAKE 25MG  BY MOUTH DAILY BEFORE BREAKFAST) 30 tablet 6  . losartan-hydrochlorothiazide (HYZAAR) 50-12.5 MG tablet Take 1 tablet by mouth daily. 90 tablet 3  . tamsulosin (FLOMAX) 0.4 MG CAPS capsule Take 1 capsule (0.4 mg total) by mouth daily. 30 capsule 3  . triamcinolone ointment (KENALOG) 0.5 % Apply 1 application topically 2 (two) times daily. 30 g 1  . Ascorbic Acid (VITAMIN C PO) Take 1 tablet by mouth daily.    . cetirizine (ZYRTEC) 10 MG tablet Take 1 tablet (10 mg total) by mouth daily. (Patient not taking: Reported on 12/20/2016) 30 tablet   . Cyanocobalamin (VITAMIN B 12 PO) Take 1 tablet by mouth daily.    . ondansetron (ZOFRAN ODT) 4 MG disintegrating tablet Take 1 tablet (4 mg total) by mouth every 8 (eight) hours as needed for nausea or vomiting. (Patient not taking: Reported on 12/20/2016) 16 tablet 0   No current facility-administered medications on file prior to visit.     BP 140/84 (BP Location: Left Arm)   Temp 97.9 F (36.6 C) (Oral)   Wt 206 lb (93.4 kg)   LMP 12/03/2012   BMI 35.92 kg/m       Objective:   Physical Exam  Constitutional: She is oriented to person, place, and time. She appears well-developed and  well-nourished. No distress.  Neck: Normal range of motion. Neck supple. No thyromegaly present.  Cardiovascular: Normal rate, regular rhythm, normal heart sounds and intact distal pulses.  Exam reveals no gallop and no friction rub.   No murmur heard. Pulmonary/Chest: Effort normal. No respiratory distress. She has no wheezes. She has no rales. She exhibits no tenderness.  Neurological: She is alert and oriented to person, place, and time.  Skin: Skin is warm and dry. No rash noted. She is not diaphoretic. No erythema. No pallor.  Psychiatric: She has a normal mood and affect. Her behavior is normal. Judgment and thought content normal.  Nursing  note and vitals reviewed.      Assessment & Plan:  1. Hypothyroidism, unspecified type - I am going to have her stop synthroid for 3 months and follow up at that time.  - Will recheck thyroid levels and get [place] of neck at that time  - Advised to follow up sooner if needed  [name], NP

## 2016-12-23 ENCOUNTER — Telehealth: Payer: Self-pay | Admitting: Adult Health

## 2016-12-23 NOTE — Telephone Encounter (Signed)
Prior Authorization initiated 12/23/2016 for clobetasol propionate 0.05% cream via covermymeds.

## 2016-12-24 NOTE — Telephone Encounter (Signed)
Prior Authorization cancelled by plan. Fax sent to PCP office.

## 2017-01-09 ENCOUNTER — Ambulatory Visit (AMBULATORY_SURGERY_CENTER): Payer: Self-pay | Admitting: *Deleted

## 2017-01-09 ENCOUNTER — Telehealth: Payer: Self-pay | Admitting: *Deleted

## 2017-01-09 VITALS — Ht 64.0 in | Wt 204.0 lb

## 2017-01-09 DIAGNOSIS — Z1211 Encounter for screening for malignant neoplasm of colon: Secondary | ICD-10-CM

## 2017-01-09 MED ORDER — NA SULFATE-K SULFATE-MG SULF 17.5-3.13-1.6 GM/177ML PO SOLN: 1.0000 | Freq: Once | ORAL | 0 refills | Status: AC

## 2017-01-09 NOTE — Telephone Encounter (Signed)
Thank you for letting me know.  No special arrangements.  - HD

## 2017-01-09 NOTE — Telephone Encounter (Signed)
DR Loletha Carrow,  I saw this lady in [facility] today. She is scheduled for a direct colon with you on 9-19 Wednesday, no previous GI history. Pt states past  issues with constipation - now soft and regular but white and mucusy stools per pt on and off the last week or so especially but these have been on and off for awhile  - PT STATES rectal bleeding  with most BM's for the last 2 years, also hurts - uses witch hazel for ? Hems- this helps the pain -uses Hydrocortisone cream as well this does help  I just wanted to make you aware and see if there was anything needed before her colon.  Thanks, MArie [facility]

## 2017-01-09 NOTE — Progress Notes (Addendum)
No egg or soy allergy known to patient  No issues with past sedation with any surgeries  or procedures, no intubation problems - pt states hard to wake post op with one surgery  No diet pills per patient No home 02 use per patient  No blood thinners per patient  Pt states past  issues with constipation - now soft and regular but white and mucusy stools per pt on and off the last week or so -NOTED AT pv 01-09-17- PT STATES rectal bleeding  with most BM's for the last 2 years, also hurts - uses witch hazel for ? Hems- this helps the pain -uses Hydrocortisone cream as well this does help  No A fib or A flutter  EMMI video sent to pt's e mail  Pt has [employer]BS blue [place]- gave pt Pay no more than 50 and she states she cannot pay 50, this is too expensive for her - called [facility] and cancelled script as sample was given  Samples of this drug were given to the patient, quantity 1 suprep kit, Lot Number [id number]  Exp 6-20

## 2017-01-10 ENCOUNTER — Ambulatory Visit: Payer: BLUE CROSS/BLUE SHIELD | Admitting: Adult Health

## 2017-01-14 ENCOUNTER — Ambulatory Visit (INDEPENDENT_AMBULATORY_CARE_PROVIDER_SITE_OTHER): Payer: BLUE CROSS/BLUE SHIELD | Admitting: Adult Health

## 2017-01-14 ENCOUNTER — Encounter: Payer: Self-pay | Admitting: Adult Health

## 2017-01-14 VITALS — BP 138/82 | HR 88 | Temp 98.4°F | Resp 18 | Ht 63.5 in | Wt 205.0 lb

## 2017-01-14 DIAGNOSIS — E039 Hypothyroidism, unspecified: Secondary | ICD-10-CM | POA: Diagnosis not present

## 2017-01-14 DIAGNOSIS — R1310 Dysphagia, unspecified: Secondary | ICD-10-CM | POA: Diagnosis not present

## 2017-01-14 NOTE — Progress Notes (Signed)
Subjective:    Patient ID: April Gonzalez, female    DOB: 1961/01/10, 56 y.o.   MRN: [id number]  HPI 56 year old female who  has a past medical history of Allergy; Anemia (05/2012); Fibroids; GERD (gastroesophageal reflux disease); H/O uterine prolapse; Heart murmur; Heart valve problem; History of blood transfusion; Hyperlipidemia; Hypertension; Rectal bleeding; Seizures ([place]); and Thyroid disorder. She presents to the office today with the complaint of " I feel like the right side of my neck is swollen, and my throat feels tight, especially when I eat." She denies any feeling as though she is choking or food is getting caught in her throat. She is worried that her thyroid levels may be high.   We stopped her synthroid about three weeks ago as she was placed on synthroid in 2016 but her TSH levels up to 4 years ago were normal. She has a [place] in 2017 of her neck that showed a stable thyroid with mildly enlarged gland.   Her symptoms started prior to stopping synthroid. She has not noticed any difference over all since stopping synthroid   Review of Systems See HPI   Past Medical History:  Diagnosis Date  . Allergy   . Anemia 05/2012   transfusion  . Fibroids    H/O myomectomy  . GERD (gastroesophageal reflux disease)    rare   . H/O uterine prolapse   . Heart murmur   . Heart valve problem    Blockage  . History of blood transfusion    x 3   . Hyperlipidemia   . Hypertension   . Rectal bleeding    NOTED AT pv 01-09-17- PT STATES with most BM's for the last 2 years, also hurts   . Seizures ([place])    last seizure age 40 or 10 per pt report-01-09-17 no change in this  . Thyroid disorder     Social History   Social History  . Marital status: Married    Spouse name: N/A  . Number of children: 2  . Years of education: 15   Occupational History  . Not on file.   Social History Main Topics  . Smoking status: Never Smoker  . Smokeless tobacco: Never Used  . Alcohol use 0.0 oz/week     Comment: rare  . Drug use: No  . Sexual activity: Yes    Birth control/ protection: Surgical     Comment: BTL    Other Topics Concern  . Not on file   Social History Narrative   She works for [employer]    Married   Has 2 children     Past Surgical History:  Procedure Laterality Date  . ANKLE SURGERY     One plate, seven screws in the right ankle   . BLADDER SUSPENSION N/A 12/10/2012   Procedure: TRANSVAGINAL TAPE (TVT) PROCEDURE;  Surgeon: Delice Lesch, MD;  Location: [facility];  Service: Gynecology;  Laterality: N/A;  . CYSTOCELE REPAIR N/A 12/10/2012   Procedure: ANTERIOR REPAIR (CYSTOCELE);  Surgeon: Eldred Manges, MD;  Location: [facility];  Service: Gynecology;  Laterality: N/A;  . CYSTOSCOPY N/A 12/10/2012   Procedure: CYSTOSCOPY;  Surgeon: Delice Lesch, MD;  Location: [facility];  Service: Gynecology;  Laterality: N/A;  . EXCISION VAGINAL CYST    . MYOMECTOMY  42YRS AGO   HYSTEROSCOPIC  . TUBAL LIGATION  10/10/1991  . VAGINAL HYSTERECTOMY N/A 12/10/2012   Procedure: Total Vaginal Hysterectomy;  Surgeon: Eldred Manges, MD;  Location: [facility];  Service: Gynecology;  Laterality: N/A;    Family History  Problem Relation Age of Onset  . Heart failure Father   . Diabetes Mother   . High blood pressure Mother   . Arthritis Mother   . Breast cancer Maternal Aunt        in her 47's   . Breast cancer Cousin   . Thyroid disease Sister   . Colon cancer Neg Hx   . Colon polyps Neg Hx   . Esophageal cancer Neg Hx   . Rectal cancer Neg Hx   . Stomach cancer Neg Hx     Allergies  Allergen Reactions  . Methimazole Anaphylaxis  . Nitroglycerin Other (See Comments)    Other reaction(s): Other sweating  . Other Other (See Comments)    Seasonal / dog hair   . Tetracycline Hcl Other (See Comments)    REACTION: elevated blood pressure  . Shrimp [Shellfish Allergy] Itching  . Penicillins Other (See Comments)    Has patient had a PCN reaction causing immediate rash,  facial/tongue/throat swelling, SOB or lightheadedness with hypotension:  Unknown 2 years ago Has patient had a PCN reaction causing severe rash involving mucus membranes or skin necrosis: No Has patient had a PCN reaction that required hospitalization No Has patient had a PCN reaction occurring within the last 10 years: Yes If all of the above answers are "NO", then may proceed with Cephalosporin use.   . Terbutaline Other (See Comments)    Current Outpatient Prescriptions on File Prior to Visit  Medication Sig Dispense Refill  . BLACK COHOSH PO Take by mouth daily. 9-6 not started yet    . EPINEPHrine (EPIPEN 2-PAK) 0.3 mg/0.3 mL IJ SOAJ injection Inject 0.3 mLs (0.3 mg total) into the muscle once. If you develop difficulty breathing 2 Device 0  . estradiol (VIVELLE-DOT) 0.05 MG/24HR patch estradiol 0.05 mg/24 hr semiweekly transdermal patch  Apply 1 patch twice a week by transdermal route.    Marland Kitchen ibuprofen (ADVIL,MOTRIN) 200 MG tablet Take 800 mg by mouth daily as needed for moderate pain.    Marland Kitchen levothyroxine (SYNTHROID, LEVOTHROID) 25 MCG tablet TAKE 1 TABLET BY MOUTH DAILY BEFORE BREAKFAST 30 tablet 6  . losartan-hydrochlorothiazide (HYZAAR) 50-12.5 MG tablet Take 1 tablet by mouth daily. 90 tablet 3  . ondansetron (ZOFRAN ODT) 4 MG disintegrating tablet Take 1 tablet (4 mg total) by mouth every 8 (eight) hours as needed for nausea or vomiting. 16 tablet 0  . tamsulosin (FLOMAX) 0.4 MG CAPS capsule Take 1 capsule (0.4 mg total) by mouth daily. 30 capsule 3  . triamcinolone ointment (KENALOG) 0.5 % Apply 1 application topically 2 (two) times daily. 30 g 1   No current facility-administered medications on file prior to visit.     BP 138/82   Pulse 88   Temp 98.4 F (36.9 C)   Resp 18   Ht 5' 3.5" (1.613 m)   Wt 205 lb (93 kg)   LMP 12/03/2012   SpO2 96%   BMI 35.74 kg/m       Objective:   Physical Exam  Constitutional: She is oriented to person, place, and time. She appears  well-developed and well-nourished. No distress.  Eyes: Pupils are equal, round, and reactive to light. Conjunctivae and EOM are normal. Right eye exhibits no discharge. Left eye exhibits no discharge. No scleral icterus.  Neck: Trachea normal, normal range of motion, full passive range of motion without pain and phonation normal. Neck  supple. No JVD present. Carotid bruit is not present. No tracheal deviation, no edema and normal range of motion present. No thyroid mass and no thyromegaly present.  Cardiovascular: Normal rate, regular rhythm, normal heart sounds and intact distal pulses.  Exam reveals no gallop and no friction rub.   No murmur heard. Pulmonary/Chest: Effort normal and breath sounds normal. No stridor. No respiratory distress. She has no wheezes. She exhibits no tenderness.  Musculoskeletal: Normal range of motion.  Lymphadenopathy:    She has no cervical adenopathy.  Neurological: She is alert and oriented to person, place, and time.  Skin: Skin is warm and dry. No rash noted. She is not diaphoretic. No erythema. No pallor.  Psychiatric: She has a normal mood and affect. Her behavior is normal. Judgment and thought content normal.  Nursing note and vitals reviewed.     Assessment & Plan:  No edema around the neck noticed. Thyroid gland is not enlarged. I will check TSH and Free T3, T4. Going to refer to GI for dysphagia and possible endoscopy. Follow up as needed  [name], NP

## 2017-01-15 LAB — T4, FREE: FREE T4: 0.83 ng/dL (ref 0.60–1.60)

## 2017-01-15 LAB — TSH: TSH: 3.3 u[IU]/mL (ref 0.35–4.50)

## 2017-01-15 LAB — T3, FREE: T3 FREE: 4 pg/mL (ref 2.3–4.2)

## 2017-01-16 ENCOUNTER — Encounter: Payer: Self-pay | Admitting: Gastroenterology

## 2017-01-18 ENCOUNTER — Other Ambulatory Visit: Payer: Self-pay

## 2017-01-18 ENCOUNTER — Emergency Department (HOSPITAL_COMMUNITY)
Admission: EM | Admit: 2017-01-18 | Discharge: 2017-01-18 | Disposition: A | Discharge status: Home / Self Care | Payer: BLUE CROSS/BLUE SHIELD | Attending: Emergency Medicine | Admitting: Emergency Medicine

## 2017-01-18 ENCOUNTER — Emergency Department (HOSPITAL_COMMUNITY): Payer: BLUE CROSS/BLUE SHIELD

## 2017-01-18 DIAGNOSIS — E039 Hypothyroidism, unspecified: Secondary | ICD-10-CM | POA: Diagnosis not present

## 2017-01-18 DIAGNOSIS — Z79899 Other long term (current) drug therapy: Secondary | ICD-10-CM | POA: Insufficient documentation

## 2017-01-18 DIAGNOSIS — I10 Essential (primary) hypertension: Secondary | ICD-10-CM | POA: Diagnosis not present

## 2017-01-18 DIAGNOSIS — R531 Weakness: Secondary | ICD-10-CM

## 2017-01-18 DIAGNOSIS — R51 Headache: Secondary | ICD-10-CM | POA: Diagnosis present

## 2017-01-18 DIAGNOSIS — Z91040 Latex allergy status: Secondary | ICD-10-CM | POA: Insufficient documentation

## 2017-01-18 DIAGNOSIS — G43409 Hemiplegic migraine, not intractable, without status migrainosus: Secondary | ICD-10-CM | POA: Insufficient documentation

## 2017-01-18 DIAGNOSIS — F419 Anxiety disorder, unspecified: Secondary | ICD-10-CM | POA: Diagnosis not present

## 2017-01-18 DIAGNOSIS — R4189 Other symptoms and signs involving cognitive functions and awareness: Secondary | ICD-10-CM

## 2017-01-18 DIAGNOSIS — Z862 Personal history of diseases of the blood and blood-forming organs and certain disorders involving the immune mechanism: Secondary | ICD-10-CM | POA: Diagnosis not present

## 2017-01-18 LAB — COMPREHENSIVE METABOLIC PANEL
ALK PHOS: 101 U/L (ref 38–126)
ALT: 28 U/L (ref 14–54)
ANION GAP: 10 (ref 5–15)
AST: 25 U/L (ref 15–41)
Albumin: 4.2 g/dL (ref 3.5–5.0)
BUN: 10 mg/dL (ref 6–20)
CO2: 21 mmol/L — AB (ref 22–32)
Calcium: 9.2 mg/dL (ref 8.9–10.3)
Chloride: 107 mmol/L (ref 101–111)
Creatinine, Ser: 1.14 mg/dL — ABNORMAL HIGH (ref 0.44–1.00)
GFR, EST NON AFRICAN AMERICAN: 54 mL/min — AB (ref >60)
Glucose, Bld: 121 mg/dL — ABNORMAL HIGH (ref 65–99)
Potassium: 3.6 mmol/L (ref 3.5–5.1)
SODIUM: 138 mmol/L (ref 135–145)
TOTAL PROTEIN: 7.5 g/dL (ref 6.5–8.1)
Total Bilirubin: 0.5 mg/dL (ref 0.3–1.2)

## 2017-01-18 LAB — DIFFERENTIAL
BASOS PCT: 1 %
Basophils Absolute: 0 10*3/uL (ref 0.0–0.1)
EOS PCT: 3 %
Eosinophils Absolute: 0.2 10*3/uL (ref 0.0–0.7)
LYMPHS PCT: 33 %
Lymphs Abs: 2.6 10*3/uL (ref 0.7–4.0)
MONO ABS: 0.5 10*3/uL (ref 0.1–1.0)
MONOS PCT: 6 %
Neutro Abs: 4.7 10*3/uL (ref 1.7–7.7)
Neutrophils Relative %: 57 %

## 2017-01-18 LAB — I-STAT CHEM 8, ED
BUN: 11 mg/dL (ref 6–20)
CALCIUM ION: 1.12 mmol/L — AB (ref 1.15–1.40)
CREATININE: 0.9 mg/dL (ref 0.44–1.00)
Chloride: 108 mmol/L (ref 101–111)
GLUCOSE: 123 mg/dL — AB (ref 65–99)
HEMATOCRIT: 44 % (ref 36.0–46.0)
HEMOGLOBIN: 15 g/dL (ref 12.0–15.0)
Potassium: 3.8 mmol/L (ref 3.5–5.1)
Sodium: 142 mmol/L (ref 135–145)
TCO2: 21 mmol/L — AB (ref 22–32)

## 2017-01-18 LAB — PROTIME-INR
INR: 1.02
PROTHROMBIN TIME: 13.3 s (ref 11.4–15.2)

## 2017-01-18 LAB — CBC
HCT: 43.9 % (ref 36.0–46.0)
Hemoglobin: 14.5 g/dL (ref 12.0–15.0)
MCH: 28.7 pg (ref 26.0–34.0)
MCHC: 33 g/dL (ref 30.0–36.0)
MCV: 86.8 fL (ref 78.0–100.0)
Platelets: 193 10*3/uL (ref 150–400)
RBC: 5.06 MIL/uL (ref 3.87–5.11)
RDW: 12.7 % (ref 11.5–15.5)
WBC: 8.1 10*3/uL (ref 4.0–10.5)

## 2017-01-18 LAB — I-STAT TROPONIN, ED: TROPONIN I, POC: 0 ng/mL (ref 0.00–0.08)

## 2017-01-18 LAB — APTT: aPTT: 25 seconds (ref 24–36)

## 2017-01-18 LAB — CBG MONITORING, ED: GLUCOSE-CAPILLARY: 117 mg/dL — AB (ref 65–99)

## 2017-01-18 MED ORDER — PROMETHAZINE HCL 25 MG/ML IJ SOLN: 25.0000 mg | Freq: Once | INTRAMUSCULAR | Status: DC

## 2017-01-18 MED ORDER — ACETAMINOPHEN 500 MG PO TABS
1000.0000 mg | ORAL_TABLET | Freq: Once | ORAL | Status: AC
  Administered 2017-01-18: 1000 mg via ORAL
  Filled 2017-01-18: qty 2

## 2017-01-18 NOTE — ED Notes (Signed)
In mri with pt

## 2017-01-18 NOTE — ED Provider Notes (Signed)
[facility] DEPT Provider Note   CSN: [id number] Arrival date & time: 01/18/17  1301   An emergency department physician performed an initial assessment on this suspected stroke patient at 1318.  History   Chief Complaint Chief Complaint  Patient presents with  . Code Stroke    HPI Letrice Pollok is a 56 y.o. female.  56 year old female with past medical history including GERD, thyroid problems, hypertension, hyperlipidemia who presents with headache and right-sided weakness. Family states the patient was arguing with somebody when she had sudden onset of left-sided headache that she described as 10/10 in intensity. A few minutes later she began having right-sided numbness involving her right face, arm, and leg. EMS noted a right leg drift. She was made a code stroke and taken immediately to [facility].  Pt denies any history of stroke or TIA. She states she has had headaches in the past but has not had a bad headache in a long time. Currently for me she states that her headache is mild.   The history is provided by the patient.    Past Medical History:  Diagnosis Date  . Allergy   . Anemia 05/2012   transfusion  . Fibroids    H/O myomectomy  . GERD (gastroesophageal reflux disease)    rare   . H/O uterine prolapse   . Heart murmur   . Heart valve problem    Blockage  . History of blood transfusion    x 3   . Hyperlipidemia   . Hypertension   . Rectal bleeding    NOTED AT pv 01-09-17- PT STATES with most BM's for the last 2 years, also hurts   . Seizures ([place])    last seizure age 57 or 10 per pt report-01-09-17 no change in this  . Thyroid disorder     Patient Active Problem List   Diagnosis Date Noted  . Skin rash 11/30/2016  . Acute non-recurrent maxillary sinusitis 11/09/2016  . Chronic daily headache 07/03/2016  . Osteoarthritis 10/20/2015  . Neck swelling 10/20/2015  . Hypothyroidism 06/28/2015  . Essential hypertension 06/28/2015  . Faintness 04/28/2015  .  Gustatory hallucinations 04/28/2015  . Hyperlipidemia 01/17/2015  . Goiter 01/06/2015  . Low vitamin B12 level 01/06/2015  . Hematochezia 07/14/2014  . Rectal bleeding 06/21/2014  . Numbness and tingling of foot 06/21/2014  . Dense breasts 07/13/2012  . Seizures ([place])   . Fibroids   . Iron deficiency anemia 01/06/2007  . HEART MURMUR, HX OF 12/04/2006    Past Surgical History:  Procedure Laterality Date  . ANKLE SURGERY     One plate, seven screws in the right ankle   . BLADDER SUSPENSION N/A 12/10/2012   Procedure: TRANSVAGINAL TAPE (TVT) PROCEDURE;  Surgeon: Delice Lesch, MD;  Location: [facility];  Service: Gynecology;  Laterality: N/A;  . CYSTOCELE REPAIR N/A 12/10/2012   Procedure: ANTERIOR REPAIR (CYSTOCELE);  Surgeon: Eldred Manges, MD;  Location: [facility];  Service: Gynecology;  Laterality: N/A;  . CYSTOSCOPY N/A 12/10/2012   Procedure: CYSTOSCOPY;  Surgeon: Delice Lesch, MD;  Location: [facility];  Service: Gynecology;  Laterality: N/A;  . EXCISION VAGINAL CYST    . MYOMECTOMY  51YRS AGO   HYSTEROSCOPIC  . TUBAL LIGATION  10/10/1991  . VAGINAL HYSTERECTOMY N/A 12/10/2012   Procedure: Total Vaginal Hysterectomy;  Surgeon: Eldred Manges, MD;  Location: [facility];  Service: Gynecology;  Laterality: N/A;    OB History    Gravida Para Term Preterm  AB Living   _0 SAB TAB Ectopic Multiple Live Births                   Home Medications    Prior to Admission medications   Medication Sig Start Date End Date Taking? Authorizing Provider  EPINEPHrine (EPIPEN 2-PAK) 0.3 mg/0.3 mL IJ SOAJ injection Inject 0.3 mLs (0.3 mg total) into the muscle once. If you develop difficulty breathing 09/18/15  Yes Debbrah Alar, NP  ibuprofen (ADVIL,MOTRIN) 200 MG tablet Take 200-800 mg by mouth daily as needed for moderate pain.    Yes [provider]  ondansetron (ZOFRAN ODT) 4 MG disintegrating tablet Take 1 tablet (4 mg total) by mouth every 8 (eight) hours as needed  for nausea or vomiting. 11/21/16  Yes Duffy Bruce, MD  triamcinolone ointment (KENALOG) 0.5 % Apply 1 application topically 2 (two) times daily. 12/13/16  Yes Nafziger, Tommi Rumps, NP  estradiol (VIVELLE-DOT) 0.05 MG/24HR patch estradiol 0.05 mg/24 hr semiweekly transdermal patch  Apply 1 patch twice a week by transdermal route.    [provider]  levothyroxine (SYNTHROID, LEVOTHROID) 25 MCG tablet TAKE 1 TABLET BY MOUTH DAILY BEFORE BREAKFAST Patient not taking: Reported on 01/18/2017 08/26/16   [name], NP  losartan-hydrochlorothiazide (HYZAAR) 50-12.5 MG tablet Take 1 tablet by mouth daily. Patient not taking: Reported on 01/18/2017 11/22/16   [name], NP  tamsulosin (FLOMAX) 0.4 MG CAPS capsule Take 1 capsule (0.4 mg total) by mouth daily. Patient not taking: Reported on 01/18/2017 11/22/16   [name], NP    Family History Family History  Problem Relation Age of Onset  . Heart failure Father   . Diabetes Mother   . High blood pressure Mother   . Arthritis Mother   . Breast cancer Maternal Aunt        in her 20's   . Breast cancer Cousin   . Thyroid disease Sister   . Colon cancer Neg Hx   . Colon polyps Neg Hx   . Esophageal cancer Neg Hx   . Rectal cancer Neg Hx   . Stomach cancer Neg Hx     Social History Social History  Substance Use Topics  . Smoking status: Never Smoker  . Smokeless tobacco: Never Used  . Alcohol use 0.0 oz/week     Comment: rare     Allergies   Methimazole; Nitroglycerin; Other; Tetracycline hcl; Promethazine; Shrimp [shellfish allergy]; Latex; Penicillins; Tape; and Terbutaline   Review of Systems Review of Systems All other systems reviewed and are negative except that which was mentioned in HPI   Physical Exam Updated Vital Signs BP (!) 149/95   Pulse 76   Resp 18   LMP 12/03/2012   SpO2 100%   Physical Exam  Constitutional: She is oriented to person, place, and time. She appears well-developed and  well-nourished. No distress.  Awake, alert  HENT:  Head: Normocephalic and atraumatic.  Eyes: Pupils are equal, round, and reactive to light. Conjunctivae and EOM are normal.  Neck: Neck supple.  Cardiovascular: Normal rate, regular rhythm and normal heart sounds.   No murmur heard. Pulmonary/Chest: Effort normal and breath sounds normal. No respiratory distress.  Abdominal: Soft. Bowel sounds are normal. She exhibits no distension. There is no tenderness.  Musculoskeletal: She exhibits no edema.  Neurological: She is alert and oriented to person, place, and time. She has normal reflexes. No cranial nerve deficit. She exhibits normal muscle tone.  Fluent speech, normal finger-to-nose testing, negative pronator drift, no clonus 5/5 strength x all 4 extremities Reports mildly decreased sensation R face, arm and leg  Skin: Skin is warm and dry.  Psychiatric:  Flat affect  Nursing note and vitals reviewed.    ED Treatments / Results  Labs (all labs ordered are listed, but only abnormal results are displayed) Labs Reviewed  COMPREHENSIVE METABOLIC PANEL - Abnormal; Notable for the following:       Result Value   CO2 21 (*)    Glucose, Bld 121 (*)    Creatinine, Ser 1.14 (*)    GFR calc non Af Amer 54 (*)    All other components within normal limits  CBG MONITORING, ED - Abnormal; Notable for the following:    Glucose-Capillary 117 (*)    All other components within normal limits  I-STAT CHEM 8, ED - Abnormal; Notable for the following:    Glucose, Bld 123 (*)    Calcium, Ion 1.12 (*)    TCO2 21 (*)    All other components within normal limits  PROTIME-INR  APTT  CBC  DIFFERENTIAL  I-STAT TROPONIN, ED    EKG  EKG Interpretation  Date/Time:  Saturday January 18 2017 14:32:21 EDT Ventricular Rate:  90 PR Interval:    QRS Duration: 76 QT Interval:  366 QTC Calculation: 448 R Axis:   20 Text Interpretation:  Sinus rhythm LAE, consider biatrial enlargement Posterior  infarct, old Minimal ST depression Baseline wander in lead(s) I II III aVR aVL V1 V2 V3 V6 Artifact No significant change since last tracing Confirmed by Theotis Burrow [phone]) on 01/18/2017 3:58:33 PM       Radiology Mr Jodene Nam Head Wo Contrast  Result Date: 01/18/2017 CLINICAL DATA:  Acute headache onset.  Focal neurological deficit. EXAM: MRI HEAD WITHOUT CONTRAST MRA HEAD WITHOUT CONTRAST TECHNIQUE: Multiplanar, multiecho pulse sequences of the brain and surrounding structures were obtained without intravenous contrast. Angiographic images of the head were obtained using MRA technique without contrast. COMPARISON:  CT same day. MRI 07/10/2016. CT angiography 12/19/2009 FINDINGS: MRI HEAD FINDINGS Brain: The brain has normal appearance without evidence of malformation, atrophy, old or acute small or large vessel infarction, hemorrhage, hydrocephalus or extra-axial collection. No pituitary abnormality. Vascular: Major vessels at the base of the brain show flow. Skull and upper cervical spine: Normal Sinuses/Orbits: Clear/ normal. Other: None significant. MRA HEAD FINDINGS Both internal carotid arteries are widely patent into the brain. No siphon stenosis. The anterior and middle cerebral vessels are patent without proximal stenosis, aneurysm or vascular malformation. Both anterior cerebral arteries receive there supply from the left carotid circulation. Both vertebral arteries are widely patent to the basilar. No basilar stenosis. Incidental proximal basilar fenestration. Posterior circulation branch vessels appear normal. IMPRESSION: Normal MRI of the brain.  No abnormality seen to explain headache. No significant vascular finding. No evidence of aneurysm or other vascular cause of headache. 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 Electronically Signed   By: Nelson Chimes M.D.   On: 01/18/2017 14:37   Mr Brain Wo  Contrast  Result Date: 01/18/2017 CLINICAL DATA:  Acute headache onset.  Focal neurological deficit. EXAM: MRI HEAD WITHOUT CONTRAST MRA HEAD WITHOUT CONTRAST TECHNIQUE: Multiplanar, multiecho pulse sequences of the brain and surrounding structures were obtained without intravenous contrast. Angiographic images of the head were obtained using MRA technique without contrast. COMPARISON:  CT same day. MRI  07/10/2016. CT angiography 12/19/2009 FINDINGS: MRI HEAD FINDINGS Brain: The brain has normal appearance without evidence of malformation, atrophy, old or acute small or large vessel infarction, hemorrhage, hydrocephalus or extra-axial collection. No pituitary abnormality. Vascular: Major vessels at the base of the brain show flow. Skull and upper cervical spine: Normal Sinuses/Orbits: Clear/ normal. Other: None significant. MRA HEAD FINDINGS Both internal carotid arteries are widely patent into the brain. No siphon stenosis. The anterior and middle cerebral vessels are patent without proximal stenosis, aneurysm or vascular malformation. Both anterior cerebral arteries receive there supply from the left carotid circulation. Both vertebral arteries are widely patent to the basilar. No basilar stenosis. Incidental proximal basilar fenestration. Posterior circulation branch vessels appear normal. IMPRESSION: Normal MRI of the brain.  No abnormality seen to explain headache. No significant vascular finding. No evidence of aneurysm or other vascular cause of headache. 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 Electronically Signed   By: Nelson Chimes M.D.   On: 01/18/2017 14:37   Ct Head Code Stroke Wo Contrast  Result Date: 01/18/2017 CLINICAL DATA:  Code stroke.  Thunderclap headache EXAM: CT HEAD WITHOUT CONTRAST TECHNIQUE: Contiguous axial images were obtained from the base of the skull through the vertex without  intravenous contrast. COMPARISON:  Brain MRI 07/10/2016 FINDINGS: Brain: No mass lesion or acute hemorrhage. No focal hypoattenuation of the basal ganglia or cortex to indicate infarcted tissue. No hydrocephalus or age advanced atrophy. Vascular: No hyperdense vessel. No advanced atherosclerotic calcification of the arteries at the skull base. Skull: Normal visualized skull base, calvarium and extracranial soft tissues. Sinuses/Orbits: No sinus fluid levels or advanced mucosal thickening. No mastoid effusion. Normal orbits. ASPECTS [facility] Stroke Program Early CT Score) - Ganglionic level infarction (caudate, lentiform nuclei, internal capsule, insula, M1-M3 cortex): 7 - Supraganglionic infarction (M4-M6 cortex): 3 Total score (0-10 with 10 being normal): 10 IMPRESSION: 1. Normal head CT. 2. ASPECTS is 10. Results were text paged to Dr. Kerney Elbe at 1:36 p.m. on 01/18/2017. Currently awaiting phone call for confirmation. Electronically Signed   By: Ulyses Jarred M.D.   On: 01/18/2017 13:36    Procedures Procedures (including critical care time)  Medications Ordered in ED Medications  promethazine (PHENERGAN) injection 25 mg (25 mg Intravenous Not Given 01/18/17 1553)  acetaminophen (TYLENOL) tablet 1,000 mg (1,000 mg Oral Given 01/18/17 1553)     Initial Impression / Assessment and Plan / ED Course  I have reviewed the triage vital signs and the nursing notes.  Pertinent labs & imaging results that were available during my care of the patient were reviewed by me and considered in my medical decision making (see chart for details).    Pt presents w/ HA associated w/ R sided numbness. A code stroke Was called and on arrival to the ED she was taken immediately to [place] where she was met by neurology team, Dr. Cheral Marker. Head CT was negative. He felt stroke was unlikely he did recommend proceeding with MRI brain to confirm. MRI brain was unremarkable.Dr. Cheral Marker felt sx were likely due to complex  migraine or potentially stress related as sx began while she was arguing with a family member. On reexamination, the patient was comfortable. She stated that her headache was mild and after discussing options she chose to try oral medication only with Tylenol. On final assessment, she stated that her headache was gone and she had no lingering neurologic symptoms. She  has been able to eat and drink here with no problems. I discussed supportive measures and extensively reviewed return precautions with her. Patient voiced understanding and was discharged in satisfactory condition.  Final Clinical Impressions(s) / ED Diagnoses   Final diagnoses:  None    New Prescriptions New Prescriptions   No medications on file     Little, Wenda Overland, MD 01/18/17 1706

## 2017-01-18 NOTE — Code Documentation (Signed)
56 y.o. Female with PMHx of seizure, HTN and HLD who was stated to be in her normal state of health this morning prior to her trying to break up an argument amongst family members. Shortly after the argument, the patient reports having had an acute onset of left-sided headache, decrease in right-sided sensation and RLE weakness. Patient then presented to [facility] ED via POV for evaluation.  On arrival to [facility], code stroke was called in triage and patient was met by the stroke team in CT. CT showing no acute intracranial abnormalities. ASPECTS 10. NIHSS 2. See EMR for NIHSS and code stroke times. VAN (-). On assessment, patient with mild RLE drift, decreased right-sided sensation and c/o frontal headache that is rated at 6-8/10. IV tPA not given d/t being too treat. Not a thrombectomy candidate d/t to no LVO / VAN (-). MRI Pending.  ED bedside handoff with ED RN Loma Sousa.

## 2017-01-18 NOTE — Consult Note (Signed)
NEURO HOSPITALIST CONSULT NOTE   Requestig physician: Dr. Rex Kras  Reason for Consult: Sudden onset of left sided headache and right sided weakness during a family argument  History obtained from:  Patient and Chart     HPI:                                                                                                                                          April Gonzalez is an 56 y.o. female with a PMHx of seizure, HTN, HLD, thyroid disorder, childhood seizures and recent rectal bleeding who was stated to be in her normal state of health this morning prior to her trying to break up an argument amongst family members. Shortly after the argument, the patient reports having had an acute onset of left-sided 10/10 headache, decrease in right-sided sensation and right arm and leg weakness. Patient then presented to [facility] ED via POV for evaluation. She was tearful in triage but alert and oriented x 4 with no aphasia.   Code Stroke was called in Triage. CT showed no acute abnormality. NIHSS = 2 for sensory loss and RUE drift. After completion of CT head, the patient stated tearfully that she also had been having partial visual obscuration "like when you have a headache" in Triage. She also endorsed that the headache was 8/10 following CT. IV phenargan 25 mg x 1 was ordered in addition to a STAT MRI/MRA of head.    Past Medical History:  Diagnosis Date  . Allergy   . Anemia 05/2012   transfusion  . Fibroids    H/O myomectomy  . GERD (gastroesophageal reflux disease)    rare   . H/O uterine prolapse   . Heart murmur   . Heart valve problem    Blockage  . History of blood transfusion    x 3   . Hyperlipidemia   . Hypertension   . Rectal bleeding    NOTED AT pv 01-09-17- PT STATES with most BM's for the last 2 years, also hurts   . Seizures ([place])    last seizure age 56 or 10 per pt report-01-09-17 no change in this  . Thyroid disorder     Past Surgical History:  Procedure  Laterality Date  . ANKLE SURGERY     One plate, seven screws in the right ankle   . BLADDER SUSPENSION N/A 12/10/2012   Procedure: TRANSVAGINAL TAPE (TVT) PROCEDURE;  Surgeon: Delice Lesch, MD;  Location: [facility];  Service: Gynecology;  Laterality: N/A;  . CYSTOCELE REPAIR N/A 12/10/2012   Procedure: ANTERIOR REPAIR (CYSTOCELE);  Surgeon: Eldred Manges, MD;  Location: [place] [facility];  Service: Gynecology;  Laterality: N/A;  . CYSTOSCOPY N/A 12/10/2012   Procedure: CYSTOSCOPY;  Surgeon: Delice Lesch, MD;  Location: [facility];  Service: Gynecology;  Laterality: N/A;  . EXCISION VAGINAL CYST    . MYOMECTOMY  73YRS AGO   HYSTEROSCOPIC  . TUBAL LIGATION  10/10/1991  . VAGINAL HYSTERECTOMY N/A 12/10/2012   Procedure: Total Vaginal Hysterectomy;  Surgeon: Eldred Manges, MD;  Location: [place];  Service: Gynecology;  Laterality: N/A;    Family History  Problem Relation Age of Onset  . Heart failure Father   . Diabetes Mother   . High blood pressure Mother   . Arthritis Mother   . Breast cancer Maternal Aunt        in her 56's   . Breast cancer Cousin   . Thyroid disease Sister   . Colon cancer Neg Hx   . Colon polyps Neg Hx   . Esophageal cancer Neg Hx   . Rectal cancer Neg Hx   . Stomach cancer Neg Hx     Social History:  reports that she has never smoked. She has never used smokeless tobacco. She reports that she drinks alcohol. She reports that she does not use drugs.  Allergies  Allergen Reactions  . Methimazole Anaphylaxis  . Nitroglycerin Other (See Comments)    Other reaction(s): Other sweating  . Other Other (See Comments)    Seasonal / dog hair   . Tetracycline Hcl Other (See Comments)    REACTION: elevated blood pressure  . Shrimp [Shellfish Allergy] Itching  . Penicillins Other (See Comments)    Has patient had a PCN reaction causing immediate rash, facial/tongue/throat swelling, SOB or lightheadedness with hypotension:  Unknown 2 years ago Has patient had a PCN reaction  causing severe rash involving mucus membranes or skin necrosis: No Has patient had a PCN reaction that required hospitalization No Has patient had a PCN reaction occurring within the last 10 years: Yes If all of the above answers are "NO", then may proceed with Cephalosporin use.   . Terbutaline Other (See Comments)    HOME MEDICATIONS:                                                                                                                       ROS:                                                                                                                                       As per HPI.   Last menstrual period  12/03/2012.  General Examination:                                                                                                      HEENT-  [place]/AT    Lungs- Respirations unlabored Extremities- No edema  Neurological Examination Mental Status: Alert and oriented. Tearful. Poor eye contact. Speech fluent with intact comprehension and naming. Able to follow 2 step directional commands without difficulty. Cranial Nerves: II: Visual fields intact. PERRL.  III,IV, VI: EOMI without nystagmus. No ptosis.  V,VII: Grimaces symmetrically. Facial temp sensation subjectively decreased on right VIII: hearing intact to voice IX,X: No hypophonia or hoarseness XI: Symmetric XII: Midline tongue extension Motor: RUE: Maximum elicitable strength is 5/5. There is giveway weakness which varies erratically for the same muscle groups when force appllied by examiner changes direction and magnitude of resistance. Nonphysiological wavering of RUE when testing for drift. These findings are distractable and no longer present when testing FNF and with cognitively demanding tasks requiring motor function.   RLE: Maximum elicitable strength is 5/5. Giveway weakness noted. Nonphysiological wavering of RLE when testing for drift.  LUE and LLE: 5/5 proximally and distally Sensory: Decreased  temp sensation to RUE and RLE. FT intact. No extinction.   Deep Tendon Reflexes: 2+ and symmetric throughout Plantars: Right: downgoing  Left: downgoing Cerebellar: No ataxia with FNF bilaterally Gait: Deferred   Lab Results: Basic Metabolic Panel:  Recent Labs Lab 01/18/17 1312 01/18/17 1327  NA 138 142  K 3.6 3.8  CL 107 108  CO2 21*  --   GLUCOSE 121* 123*  BUN 10 11  CREATININE 1.14* 0.90  CALCIUM 9.2  --     Liver Function Tests:  Recent Labs Lab 01/18/17 1312  AST 25  ALT 28  ALKPHOS 101  BILITOT 0.5  PROT 7.5  ALBUMIN 4.2   No results for input(s): LIPASE, AMYLASE in the last 168 hours. No results for input(s): AMMONIA in the last 168 hours.  CBC:  Recent Labs Lab 01/18/17 1312 01/18/17 1327  WBC 8.1  --   NEUTROABS 4.7  --   HGB 14.5 15.0  HCT 43.9 44.0  MCV 86.8  --   PLT 193  --     Cardiac Enzymes: No results for input(s): CKTOTAL, CKMB, CKMBINDEX, TROPONINI in the last 168 hours.  Lipid Panel: No results for input(s): CHOL, TRIG, HDL, CHOLHDL, VLDL, LDLCALC in the last 168 hours.  CBG:  Recent Labs Lab 01/18/17 1318  GLUCAP 117*    Microbiology: Results for orders placed or performed during the hospital encounter of 02/06/15  Culture, Group A Strep     Status: None   Collection Time: 02/06/15  5:50 PM  Result Value Ref Range Status   Strep A Culture Negative  Final    Comment: (NOTE) Performed At: [facility] [place], [place] [id number] Lindon Romp MD YP:[id number]     Coagulation Studies:  Recent Labs  01/18/17 1312  LABPROT 13.3  INR 1.02    Imaging: Ct Head Code Stroke Wo Contrast  Result  Date: 01/18/2017 CLINICAL DATA:  Code stroke.  Thunderclap headache EXAM: CT HEAD WITHOUT CONTRAST TECHNIQUE: Contiguous axial images were obtained from the base of the skull through the vertex without intravenous contrast. COMPARISON:  Brain MRI 07/10/2016 FINDINGS: Brain: No mass lesion or acute  hemorrhage. No focal hypoattenuation of the basal ganglia or cortex to indicate infarcted tissue. No hydrocephalus or age advanced atrophy. Vascular: No hyperdense vessel. No advanced atherosclerotic calcification of the arteries at the skull base. Skull: Normal visualized skull base, calvarium and extracranial soft tissues. Sinuses/Orbits: No sinus fluid levels or advanced mucosal thickening. No mastoid effusion. Normal orbits. ASPECTS [facility] Stroke Program Early CT Score) - Ganglionic level infarction (caudate, lentiform nuclei, internal capsule, insula, M1-M3 cortex): 7 - Supraganglionic infarction (M4-M6 cortex): 3 Total score (0-10 with 10 being normal): 10 IMPRESSION: 1. Normal head CT. 2. ASPECTS is 10. Results were text paged to Dr. Kerney Elbe at 1:36 p.m. on 01/18/2017. Currently awaiting phone call for confirmation. Electronically Signed   By: Ulyses Jarred M.D.   On: 01/18/2017 13:36    Assessment: Sudden onset of left sided headache and right sided weakness during a family argument  1. Neurological exam shows no definite localizable findings. Nonphysiological weakness and subjective sensory loss are as described in the examination above.  2. CT head normal. 3. MRI brain normal. 4. MRA head normal.  Recommendations: 1. Reassure patient. 2. Observe for improvement of headache following 25 mg IV Phenergan, which was ordered while in CT 3. Discuss recent psychosocial stressor(s) with patient, including the argument today which may have triggered nonphysiological, stress-related neurological deficits. May need psychology consult and/or administration of an anxiolytic. 4. Given her age, starting her on ASA 81 mg po qd may be of benefit prophylactically   Electronically signed: Dr. Kerney Elbe 01/18/2017, 2:30 PM

## 2017-01-18 NOTE — ED Notes (Signed)
Declined W/C at D/C and was escorted to lobby by RN. 

## 2017-01-18 NOTE — ED Triage Notes (Signed)
Per pt and family- pt family states the family was arguing when the patient had sudden onset left sided headahce 10/10. Pt sister brought pt in, states she was weak getting out of car. Pt noted to have right sided sensory deficit to right face, right arm and right leg. Right sided leg drift noted. Alert and oriented X 4. Tearful at triage, states pain to left head 10/10. PIV 20G placed to RAC, blood obtained. Airway cleared by MD [place], pt brought to CT1.

## 2017-01-22 ENCOUNTER — Encounter: Payer: Self-pay | Admitting: Gastroenterology

## 2017-01-22 ENCOUNTER — Ambulatory Visit (AMBULATORY_SURGERY_CENTER): Payer: BLUE CROSS/BLUE SHIELD | Admitting: Gastroenterology

## 2017-01-22 VITALS — BP 148/94 | HR 69 | Temp 97.8°F | Resp 11 | Ht 64.0 in | Wt 204.0 lb

## 2017-01-22 DIAGNOSIS — Z1211 Encounter for screening for malignant neoplasm of colon: Secondary | ICD-10-CM | POA: Diagnosis present

## 2017-01-22 DIAGNOSIS — D123 Benign neoplasm of transverse colon: Secondary | ICD-10-CM

## 2017-01-22 DIAGNOSIS — K635 Polyp of colon: Secondary | ICD-10-CM

## 2017-01-22 DIAGNOSIS — D120 Benign neoplasm of cecum: Secondary | ICD-10-CM | POA: Diagnosis not present

## 2017-01-22 MED ORDER — SODIUM CHLORIDE 0.9 % IV SOLN: 500.0000 mL | INTRAVENOUS | Status: DC

## 2017-01-22 NOTE — Patient Instructions (Signed)
YOU HAD AN ENDOSCOPIC PROCEDURE TODAY AT THE [facility]:   Refer to the procedure report that was given to you for any specific questions about what was found during the examination.  If the procedure report does not answer your questions, please call your gastroenterologist to clarify.  If you requested that your care partner not be given the details of your procedure findings, then the procedure report has been included in a sealed envelope for you to review at your convenience later.  YOU SHOULD EXPECT: Some feelings of bloating in the abdomen. Passage of more gas than usual.  Walking can help get rid of the air that was put into your GI tract during the procedure and reduce the bloating. If you had a lower endoscopy (such as a colonoscopy or flexible sigmoidoscopy) you may notice spotting of blood in your stool or on the toilet paper. If you underwent a bowel prep for your procedure, you may not have a normal bowel movement for a few days.  Please Note:  You might notice some irritation and congestion in your nose or some drainage.  This is from the oxygen used during your procedure.  There is no need for concern and it should clear up in a day or so.  SYMPTOMS TO REPORT IMMEDIATELY:   Following lower endoscopy (colonoscopy or flexible sigmoidoscopy):  Excessive amounts of blood in the stool  Significant tenderness or worsening of abdominal pains  Swelling of the abdomen that is new, acute  Fever of 100F or higher    For urgent or emergent issues, a gastroenterologist can be reached at any hour by calling [phone].   DIET:  We do recommend a small meal at first, but then you may proceed to your regular diet.  Drink plenty of fluids but you should avoid alcoholic beverages for 24 hours.  ACTIVITY:  You should plan to take it easy for the rest of today and you should NOT DRIVE or use heavy machinery until tomorrow (because of the sedation medicines used during the test).     FOLLOW UP: Our staff will call the number listed on your records the next business day following your procedure to check on you and address any questions or concerns that you may have regarding the information given to you following your procedure. If we do not reach you, we will leave a message.  However, if you are feeling well and you are not experiencing any problems, there is no need to return our call.  We will assume that you have returned to your regular daily activities without incident.  If any biopsies were taken you will be contacted by phone or by letter within the next 1-3 weeks.  Please call us at [phone] if you have not heard about the biopsies in 3 weeks.    SIGNATURES/CONFIDENTIALITY: You and/or your care partner have signed paperwork which will be entered into your electronic medical record.  These signatures attest to the fact that that the information above on your After Visit Summary has been reviewed and is understood.  Full responsibility of the confidentiality of this discharge information lies with you and/or your care-partner.   Resume medications. Information given on polyps. 

## 2017-01-22 NOTE — Op Note (Signed)
[facility] Patient Name: Negin Hegg Procedure Date: 01/22/2017 9:54 AM MRN: [id number] Endoscopist: Jaconita. Loletha Carrow , MD Age: 56 Referring MD:  Date of Birth: 1960-11-01 Gender: Female Account #: [id number] Procedure:                Colonoscopy Indications:              Screening for colorectal malignant neoplasm, This                            is the patient's first colonoscopy Medicines:                Monitored Anesthesia Care Procedure:                Pre-Anesthesia Assessment:                           - Prior to the procedure, a History and Physical                            was performed, and patient medications and                            allergies were reviewed. The patient's tolerance of                            previous anesthesia was also reviewed. The risks                            and benefits of the procedure and the sedation                            options and risks were discussed with the patient.                            All questions were answered, and informed consent                            was obtained. Prior Anticoagulants: The patient has                            taken no previous anticoagulant or antiplatelet                            agents. ASA Grade Assessment: III - A patient with                            severe systemic disease. After reviewing the risks                            and benefits, the patient was deemed in                            satisfactory condition to undergo the procedure.  After obtaining informed consent, the colonoscope                            was passed under direct vision. Throughout the                            procedure, the patient's blood pressure, pulse, and                            oxygen saturations were monitored continuously. The                            Colonoscope was introduced through the anus and                            advanced to the the  cecum, identified by                            appendiceal orifice and ileocecal valve. The                            colonoscopy was performed without difficulty. The                            patient tolerated the procedure well. The quality                            of the bowel preparation was excellent. The                            ileocecal valve, appendiceal orifice, and rectum                            were photographed. The quality of the bowel                            preparation was evaluated using the BBPS [place]                            Bowel Preparation Scale) with scores of: Right                            Colon = 3, Transverse Colon = 3 and Left Colon = 3                            (entire mucosa seen well with no residual staining,                            small fragments of stool or opaque liquid). The                            total BBPS score equals 9. The bowel preparation  used was SUPREP. Scope In: 10:09:39 AM Scope Out: 10:24:24 AM Scope Withdrawal Time: 0 hours 10 minutes 10 seconds  Total Procedure Duration: 0 hours 14 minutes 45 seconds  Findings:                 The perianal and digital rectal examinations were                            normal.                           A 1 mm polyp was found in the cecum. The polyp was                            sessile. The polyp was removed with a cold biopsy                            forceps. Resection and retrieval were complete.                           A 4 mm polyp was found in the distal transverse                            colon. The polyp was sessile. The polyp was removed                            with a cold snare. Resection and retrieval were                            complete.                           The exam was otherwise without abnormality on                            direct and retroflexion views. Complications:            No immediate  complications. Estimated Blood Loss:     Estimated blood loss: none. Impression:               - One 1 mm polyp in the cecum, removed with a cold                            biopsy forceps. Resected and retrieved.                           - One 4 mm polyp in the distal transverse colon,                            removed with a cold snare. Resected and retrieved.                           - The examination was otherwise normal on direct  and retroflexion views. Recommendation:           - Patient has a contact number available for                            emergencies. The signs and symptoms of potential                            delayed complications were discussed with the                            patient. Return to normal activities tomorrow.                            Written discharge instructions were provided to the                            patient.                           - Resume previous diet.                           - Continue present medications.                           - Await pathology results.                           - Repeat colonoscopy is recommended for                            surveillance. The colonoscopy date will be                            determined after pathology results from today's                            exam become available for review. Georgiann Neider L. Loletha Carrow, MD 01/22/2017 10:29:36 AM This report has been signed electronically.

## 2017-01-22 NOTE — Progress Notes (Signed)
A and O x3. Report to RN. Tolerated MAC anesthesia well.

## 2017-01-22 NOTE — Progress Notes (Signed)
Used paper tape. Patient allergy to latex. Ok to use tegraderm.

## 2017-01-22 NOTE — Progress Notes (Signed)
Called to room to assist during endoscopic procedure.  Patient ID and intended procedure confirmed with present staff. Received instructions for my participation in the procedure from the performing physician.  

## 2017-01-23 ENCOUNTER — Telehealth: Payer: Self-pay

## 2017-01-23 NOTE — Telephone Encounter (Signed)
Patient calling back states she is doing okay.

## 2017-01-23 NOTE — Telephone Encounter (Signed)
  Follow up Call-  Call back number 01/22/2017  Post procedure Call Back phone  # [phone]  Permission to leave phone message Yes  Some recent data might be hidden    No answer, mailbox was full so no message left

## 2017-01-23 NOTE — Telephone Encounter (Signed)
  Follow up Call-  Call back number 01/22/2017  Post procedure Call Back phone  # [phone]  Permission to leave phone message Yes  Some recent data might be hidden     Cannot leave message because mailbox is full

## 2017-01-23 NOTE — Telephone Encounter (Signed)
Patient called back to state she was feeling fine. No problems accept for her side hurting and pt does not think this is related.

## 2017-01-24 ENCOUNTER — Encounter: Payer: Self-pay | Admitting: Adult Health

## 2017-01-28 ENCOUNTER — Telehealth: Payer: Self-pay | Admitting: Gastroenterology

## 2017-01-28 NOTE — Telephone Encounter (Signed)
Called patient back, let her know that Dr. Loletha Carrow has not had a chance to review the path results yet, and as soon as he has we will contact her with results.

## 2017-01-28 NOTE — Telephone Encounter (Signed)
Adenoma and sessile serrated polyp.  These are common polyp types, both considered potentially precancerous, so recall colonoscopy in 5 years. She will get a letter about this as well, which we do for all patients.

## 2017-01-28 NOTE — Telephone Encounter (Signed)
Patient states she would like a call with results from colon on 9.19.18.

## 2017-01-29 ENCOUNTER — Encounter: Payer: Self-pay | Admitting: Gastroenterology

## 2017-01-29 NOTE — Telephone Encounter (Signed)
Patient aware of results and recommendations to repeat colonoscopy in 5 years.

## 2017-01-29 NOTE — Telephone Encounter (Signed)
Left message for patient to call back for results.  

## 2017-02-04 ENCOUNTER — Encounter (HOSPITAL_COMMUNITY): Payer: Self-pay

## 2017-02-04 DIAGNOSIS — Y929 Unspecified place or not applicable: Secondary | ICD-10-CM | POA: Diagnosis not present

## 2017-02-04 DIAGNOSIS — M542 Cervicalgia: Secondary | ICD-10-CM | POA: Diagnosis not present

## 2017-02-04 DIAGNOSIS — R51 Headache: Secondary | ICD-10-CM | POA: Diagnosis present

## 2017-02-04 DIAGNOSIS — Y939 Activity, unspecified: Secondary | ICD-10-CM | POA: Insufficient documentation

## 2017-02-04 DIAGNOSIS — R0789 Other chest pain: Secondary | ICD-10-CM | POA: Diagnosis not present

## 2017-02-04 DIAGNOSIS — Y999 Unspecified external cause status: Secondary | ICD-10-CM | POA: Diagnosis not present

## 2017-02-04 DIAGNOSIS — I10 Essential (primary) hypertension: Secondary | ICD-10-CM | POA: Diagnosis not present

## 2017-02-04 DIAGNOSIS — S161XXA Strain of muscle, fascia and tendon at neck level, initial encounter: Secondary | ICD-10-CM | POA: Insufficient documentation
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
## 2017-02-05 ENCOUNTER — Emergency Department (HOSPITAL_COMMUNITY)
Admission: EM | Admit: 2017-02-05 | Discharge: 2017-02-05 | Disposition: A | Discharge status: Home / Self Care | Payer: BLUE CROSS/BLUE SHIELD | Attending: Emergency Medicine | Admitting: Emergency Medicine

## 2017-02-05 ENCOUNTER — Emergency Department (HOSPITAL_COMMUNITY): Payer: BLUE CROSS/BLUE SHIELD

## 2017-02-05 DIAGNOSIS — S161XXA Strain of muscle, fascia and tendon at neck level, initial encounter: Secondary | ICD-10-CM | POA: Diagnosis not present

## 2017-02-05 MED ORDER — TRAMADOL HCL 50 MG PO TABS: 50.0000 mg | ORAL_TABLET | Freq: Four times a day (QID) | ORAL | 0 refills | Status: DC | PRN

## 2017-02-05 MED ORDER — METHOCARBAMOL 500 MG PO TABS: 500.0000 mg | ORAL_TABLET | Freq: Four times a day (QID) | ORAL | 0 refills | Status: DC | PRN

## 2017-02-05 MED ORDER — IBUPROFEN 800 MG PO TABS
800.0000 mg | ORAL_TABLET | Freq: Once | ORAL | Status: AC
  Administered 2017-02-05: 800 mg via ORAL
  Filled 2017-02-05: qty 1

## 2017-02-05 NOTE — ED Notes (Signed)
Bed: [facility] Expected date:  Expected time:  Means of arrival:  Comments: Rm 1?

## 2017-02-05 NOTE — ED Notes (Signed)
Bed: [facility] Expected date:  Expected time:  Means of arrival:  Comments: 

## 2017-02-05 NOTE — ED Notes (Signed)
Patient is alert and oriented x3.  She was given DC instructions and follow up visit instructions.  Patient gave verbal understanding. She was DC ambulatory under her own power to home.  V/S stable.  He was not showing any signs of distress on DC 
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
## 2017-02-05 NOTE — Discharge Instructions (Signed)
Apply ice several times a day. Take ibuprofen or naproxen as needed for pain. Take tramadol for more severe pain.

## 2017-02-11 ENCOUNTER — Encounter: Payer: Self-pay | Admitting: Internal Medicine

## 2017-02-11 ENCOUNTER — Ambulatory Visit (INDEPENDENT_AMBULATORY_CARE_PROVIDER_SITE_OTHER): Payer: BLUE CROSS/BLUE SHIELD | Admitting: Internal Medicine

## 2017-02-11 VITALS — BP 122/80 | HR 80 | Temp 97.7°F | Ht 63.0 in | Wt 195.6 lb

## 2017-02-11 DIAGNOSIS — L309 Dermatitis, unspecified: Secondary | ICD-10-CM

## 2017-02-11 MED ORDER — HALOBETASOL PROPIONATE 0.05 % EX OINT: TOPICAL_OINTMENT | Freq: Two times a day (BID) | CUTANEOUS | 1 refills | Status: DC

## 2017-02-11 NOTE — Progress Notes (Signed)
Subjective:    Patient ID: April Gonzalez, female    DOB: [birth year], 56 y.o.   MRN: [id number]  HPI  56 year old patient who presents with a chief complaint of a persistent hand dermatitis.  She states this has been present for 3-4 months and is slightly pruritic but mainly painful.  She has been treated with high potency topical ointments with only temporary benefit She is on no new medications.  Thyroid supplementation has been discontinued as well as her antihypertensive regimen of losartan hydrochlorothiazide  Past Medical History:  Diagnosis Date  . Allergy   . Anemia 05/2012   transfusion  . Fibroids    H/O myomectomy  . GERD (gastroesophageal reflux disease)    rare   . H/O uterine prolapse   . Heart murmur   . Heart valve problem    Blockage  . History of blood transfusion    x 3   . Hyperlipidemia   . Hypertension   . Rectal bleeding    NOTED AT pv 01-09-17- PT STATES with most BM's for the last 2 years, also hurts   . Seizures ([place])    last seizure age 56 or 10 per pt report-01-09-17 no change in this  . Thyroid disorder      Social History   Social History  . Marital status: Married    Spouse name: N/A  . Number of children: 2  . Years of education: 15   Occupational History  . Not on file.   Social History Main Topics  . Smoking status: Never Smoker  . Smokeless tobacco: Never Used  . Alcohol use 0.0 oz/week     Comment: rare  . Drug use: No  . Sexual activity: Yes    Birth control/ protection: Surgical     Comment: BTL    Other Topics Concern  . Not on file   Social History Narrative   She works for [employer]    Married   Has 2 children     Past Surgical History:  Procedure Laterality Date  . ANKLE SURGERY     One plate, seven screws in the right ankle   . BLADDER SUSPENSION N/A 12/10/2012   Procedure: TRANSVAGINAL TAPE (TVT) PROCEDURE;  Surgeon: Delice Lesch, MD;  Location: [facility];  Service: Gynecology;  Laterality: N/A;  . CYSTOCELE  REPAIR N/A 12/10/2012   Procedure: ANTERIOR REPAIR (CYSTOCELE);  Surgeon: Eldred Manges, MD;  Location: [facility];  Service: Gynecology;  Laterality: N/A;  . CYSTOSCOPY N/A 12/10/2012   Procedure: CYSTOSCOPY;  Surgeon: Delice Lesch, MD;  Location: [facility];  Service: Gynecology;  Laterality: N/A;  . EXCISION VAGINAL CYST    . MYOMECTOMY  50YRS AGO   HYSTEROSCOPIC  . TUBAL LIGATION  10/10/1991  . VAGINAL HYSTERECTOMY N/A 12/10/2012   Procedure: Total Vaginal Hysterectomy;  Surgeon: Eldred Manges, MD;  Location: [facility];  Service: Gynecology;  Laterality: N/A;    Family History  Problem Relation Age of Onset  . Heart failure Father   . Diabetes Mother   . High blood pressure Mother   . Arthritis Mother   . Breast cancer Maternal Aunt        in her 47's   . Breast cancer Cousin   . Thyroid disease Sister   . Colon cancer Neg Hx   . Colon polyps Neg Hx   . Esophageal cancer Neg Hx   . Rectal cancer Neg Hx   . Stomach cancer Neg Hx  Allergies  Allergen Reactions  . Methimazole Anaphylaxis    EMS had to be called and a 7-day's stay in the hospital resulted  . Nitroglycerin Other (See Comments)    Sweating   . Other Itching and Other (See Comments)    Seasonal allergies & dog hair = runny nose also  . Tetracycline Hcl Hypertension  . Promethazine Nausea Only    Made the patient feel like she was going to vomit and it "spaced" her out  . Shrimp [Shellfish Allergy] Itching  . Latex Rash  . Penicillins Other (See Comments)    Has patient had a PCN reaction causing immediate rash, facial/tongue/throat swelling, SOB or lightheadedness with hypotension: Unk Has patient had a PCN reaction causing severe rash involving mucus membranes or skin necrosis: Unk Has patient had a PCN reaction that required hospitalization: Unk Has patient had a PCN reaction occurring within the last 10 years: Yes If all of the above answers are "NO", then may proceed with Cephalosporin use.   . Tape  Rash  . Terbutaline Other (See Comments)    Reaction not recalled (??)    Current Outpatient Prescriptions on File Prior to Visit  Medication Sig Dispense Refill  . EPINEPHrine (EPIPEN 2-PAK) 0.3 mg/0.3 mL IJ SOAJ injection Inject 0.3 mLs (0.3 mg total) into the muscle once. If you develop difficulty breathing 2 Device 0  . estradiol (VIVELLE-DOT) 0.05 MG/24HR patch estradiol 0.05 mg/24 hr semiweekly transdermal patch  Apply 1 patch twice a week by transdermal route.    Marland Kitchen ibuprofen (ADVIL,MOTRIN) 200 MG tablet Take 200-800 mg by mouth daily as needed for moderate pain.     . methocarbamol (ROBAXIN) 500 MG tablet Take 1 tablet (500 mg total) by mouth 4 (four) times daily as needed for muscle spasms. 40 tablet 0  . naproxen sodium (ANAPROX) 220 MG tablet Take 220 mg by mouth 2 (two) times daily with a meal.    . ondansetron (ZOFRAN ODT) 4 MG disintegrating tablet Take 1 tablet (4 mg total) by mouth every 8 (eight) hours as needed for nausea or vomiting. 16 tablet 0  . tamsulosin (FLOMAX) 0.4 MG CAPS capsule Take 1 capsule (0.4 mg total) by mouth daily. 30 capsule 3  . traMADol (ULTRAM) 50 MG tablet Take 1 tablet (50 mg total) by mouth every 6 (six) hours as needed. 15 tablet 0  . triamcinolone ointment (KENALOG) 0.5 % Apply 1 application topically 2 (two) times daily. 30 g 1   Current Facility-Administered Medications on File Prior to Visit  Medication Dose Route Frequency Provider Last Rate Last Dose  . 0.9 %  sodium chloride infusion  500 mL Intravenous Continuous Danis, Estill Cotta III, MD        BP 122/80 (BP Location: Left Arm, Patient Position: Sitting, Cuff Size: Normal)   Pulse 80   Temp 97.7 F (36.5 C) (Oral)   Ht 5\' 3"  (1.6 m)   Wt 195 lb 9.6 oz (88.7 kg)   LMP 12/03/2012   SpO2 98%   BMI 34.65 kg/m     Review of Systems  Constitutional: Negative.   HENT: Negative for congestion, dental problem, hearing loss, rhinorrhea, sinus pressure, sore throat and tinnitus.   Eyes:  Negative for pain, discharge and visual disturbance.  Respiratory: Negative for cough and shortness of breath.   Cardiovascular: Negative for chest pain, palpitations and leg swelling.  Gastrointestinal: Negative for abdominal distention, abdominal pain, blood in stool, constipation, diarrhea, nausea and vomiting.  Genitourinary: Negative for  difficulty urinating, dysuria, flank pain, frequency, hematuria, pelvic pain, urgency, vaginal bleeding, vaginal discharge and vaginal pain.  Musculoskeletal: Negative for arthralgias, gait problem and joint swelling.  Skin: Positive for rash.  Neurological: Negative for dizziness, syncope, speech difficulty, weakness, numbness and headaches.  Hematological: Negative for adenopathy.  Psychiatric/Behavioral: Negative for agitation, behavioral problems and dysphoric mood. The patient is not nervous/anxious.        Objective:   Physical Exam  Constitutional: She appears well-developed and well-nourished. No distress.  Blood pressure 120/82  Skin:  Chronic hand dermatitis with dry flaky skin involving primarily the palmar aspects          Assessment & Plan:   Hand dermatitis.  Will continue high potency steroid ointment Refer dermatology  History of hypertension.  Blood pressure remained stable off medication  Follow-up PCP 1 month as scheduled  Nyoka Cowden

## 2017-02-11 NOTE — Patient Instructions (Addendum)
Hand Dermatitis  Hand dermatitis is a skin condition that causes small, itchy, raised dots or fluid-filled blisters to form over the palms of the hands. This condition may also be called hand eczema.  What are the causes?  The cause of this condition is not known.  What increases the risk?  This condition is more likely to develop in people who have a history of allergies, such as:   Hay fever.   Allergic asthma.   An allergy to latex.    Chemical exposure, injuries, and environmental irritants can make hand dermatitis worse. Washing your hands too often can remove natural oils, which can dry out the skin and contribute to outbreaks of this condition.  What are the signs or symptoms?  The most common symptom of this condition is intense itchiness. Cracks or grooves (fissures) on the fingers can also develop. Affected areas can be painful, especially areas where large blisters have formed.  How is this diagnosed?  This condition is diagnosed with a medical history and physical exam.  How is this treated?  This condition is treated with medicines, including:   Steroid creams and ointments.   Oral steroid medicines.   Antibiotic medicines. These are prescribed if you have an infection.   Antihistamine medicines. These help to reduce itchiness.    Follow these instructions at home:   Take or apply over-the-counter and prescription medicines only as told by your health care provider.   If you were prescribed an antibiotic medicine, use it as told by your health care provider. Do not stop using the antibiotic even if you start to feel better.   Avoid washing your hands more often than necessary.   Avoid using harsh chemicals on your hands.   Wear protective gloves when you handle products that can irritate your skin.   Keep all follow-up visits as told by your health care provider. This is important.  Contact a health care provider if:   Your rash does not improve during the first week of treatment.   Your  rash is red or tender.   Your rash has pus coming from it.   Your rash spreads.  This information is not intended to replace advice given to you by your health care provider. Make sure you discuss any questions you have with your health care provider.  Document Released: 04/22/2005 Document Revised: 09/28/2015 Document Reviewed: 11/04/2014  Elsevier Interactive Patient Education  2018 [employer] Inc[employer]

## 2017-02-17 ENCOUNTER — Other Ambulatory Visit: Payer: Self-pay | Admitting: Adult Health

## 2017-02-17 DIAGNOSIS — N200 Calculus of kidney: Secondary | ICD-10-CM

## 2017-02-19 ENCOUNTER — Other Ambulatory Visit: Payer: Self-pay | Admitting: Family Medicine

## 2017-02-19 ENCOUNTER — Telehealth: Payer: Self-pay | Admitting: Gastroenterology

## 2017-02-19 NOTE — Telephone Encounter (Signed)
She can have another 8 pills to take for break through pain. Needs to transition to Motrin

## 2017-02-19 NOTE — Telephone Encounter (Signed)
Left a message for a return call.

## 2017-02-19 NOTE — Telephone Encounter (Signed)
April Gonzalez, looks like pt was seen on 02/05/17 in ED and given rx.  Please advise.  Thanks!!

## 2017-02-19 NOTE — Telephone Encounter (Signed)
Patient states that 2 days ago she had some blood in her stool, none since, normal looking bm today. She has had intermittent peri-umbilical abdominal pain/cramping for last couple of day too. She does have zofran for any prn nausea, which she states she has also been experiencing.

## 2017-02-19 NOTE — Telephone Encounter (Signed)
She should no longer need this medication. It was to help her pass a kidney stone back in July. Medication refused

## 2017-02-20 NOTE — Telephone Encounter (Signed)
Patient callling in regarding this. She says that it is ok to leave a detailed message or call after 3pm

## 2017-02-20 NOTE — Telephone Encounter (Signed)
Routed to DOD, Dr. Loletha Carrow patient, I have not received an answer back on yesterdays phone call and patient is calling back today. Please advise, thank you.

## 2017-02-20 NOTE — Telephone Encounter (Signed)
Request denied.  I spoke to the pt and she states she does not need a refill.  Message sent to the pharmacy.

## 2017-02-20 NOTE — Telephone Encounter (Signed)
Misty pt returned your call and she state give her at least 1/2 hr she is getting in the shower now.

## 2017-02-20 NOTE — Telephone Encounter (Signed)
Just had fairly unremarkable colonoscopy (I reviewed). She does have hemorrhoids. For severe bleeding or pain she should go to the emergency room. Otherwise, routine office evaluation recommended

## 2017-02-20 NOTE — Telephone Encounter (Signed)
Spoke to patient, she is no longer having any rectal bleeding but does continue to have abdominal pain. Offered her appointment with APP for tomorrow, she needs earlier in the day. Scheduled follow up visit for 10/26 at 10:30 with Alonza Bogus, PA-C. Patient instructed to go to ED if symptoms worsen.

## 2017-02-28 ENCOUNTER — Encounter: Payer: Self-pay | Admitting: Gastroenterology

## 2017-02-28 ENCOUNTER — Ambulatory Visit (INDEPENDENT_AMBULATORY_CARE_PROVIDER_SITE_OTHER): Payer: BLUE CROSS/BLUE SHIELD | Admitting: Gastroenterology

## 2017-02-28 VITALS — BP 132/82 | HR 76 | Ht 64.0 in | Wt 196.2 lb

## 2017-02-28 DIAGNOSIS — K625 Hemorrhage of anus and rectum: Secondary | ICD-10-CM | POA: Diagnosis not present

## 2017-02-28 DIAGNOSIS — K589 Irritable bowel syndrome without diarrhea: Secondary | ICD-10-CM | POA: Diagnosis not present

## 2017-02-28 DIAGNOSIS — K648 Other hemorrhoids: Secondary | ICD-10-CM | POA: Insufficient documentation

## 2017-02-28 DIAGNOSIS — R1084 Generalized abdominal pain: Secondary | ICD-10-CM | POA: Diagnosis not present

## 2017-02-28 MED ORDER — HYOSCYAMINE SULFATE 0.125 MG SL SUBL: 0.1250 mg | SUBLINGUAL_TABLET | SUBLINGUAL | 1 refills | Status: DC | PRN

## 2017-02-28 NOTE — Progress Notes (Signed)
02/28/2017 April Gonzalez [id number] 1961-01-09   HISTORY OF PRESENT ILLNESS:  This is a 56 year old female who is known to Dr. Loletha Carrow.  She just had a colonoscopy performed by him on January 22, 2017.  At that time she had 2 small polyps removed, one was 1 mm in size and the other was 4 mm in size.  One was a tubular adenoma and the other one was sessile serrated polyp.  Repeat colonoscopy was recommended in 5 years from that time.  She is here today with complaints of rectal bleeding.  She says that she had one episode of rectal bleeding described as bright red blood.  This occurred about 10 days ago has not had any further bleeding since that time.  She did have hemorrhoids at the time of her colonoscopy as well.  While she is here she also reports alternating bowel habits, sometimes constipation and straining, sometimes loose stools.  Complains of lower abdominal cramping intermittently as well.  In addition to colonoscopy she also had a CT scan of the abdomen and pelvis with contrast in July of this year to evaluate complaints of lower abdominal pain, nausea, diarrhea.  That study did not show any cause for her symptoms.  CBC, CMP, TSH, sed rate, CRP all within normal limits and unremarkable from a GI standpoint as to any cause of her symptoms.   Past Medical History:  Diagnosis Date  . Allergy   . Anemia 05/2012   transfusion  . Fibroids    H/O myomectomy  . GERD (gastroesophageal reflux disease)    rare   . H/O uterine prolapse   . Heart murmur   . Heart valve problem    Blockage  . History of blood transfusion    x 3   . Hyperlipidemia   . Hypertension   . Rectal bleeding    NOTED AT pv 01-09-17- PT STATES with most BM's for the last 2 years, also hurts   . Seizures ([place])    last seizure age 71 or 10 per pt report-01-09-17 no change in this  . Thyroid disorder    Past Surgical History:  Procedure Laterality Date  . ANKLE SURGERY     One plate, seven screws in the right ankle    . BLADDER SUSPENSION N/A 12/10/2012   Procedure: TRANSVAGINAL TAPE (TVT) PROCEDURE;  Surgeon: Delice Lesch, MD;  Location: [facility];  Service: Gynecology;  Laterality: N/A;  . CYSTOCELE REPAIR N/A 12/10/2012   Procedure: ANTERIOR REPAIR (CYSTOCELE);  Surgeon: Eldred Manges, MD;  Location: [facility];  Service: Gynecology;  Laterality: N/A;  . CYSTOSCOPY N/A 12/10/2012   Procedure: CYSTOSCOPY;  Surgeon: Delice Lesch, MD;  Location: [facility];  Service: Gynecology;  Laterality: N/A;  . EXCISION VAGINAL CYST    . MYOMECTOMY  80YRS AGO   HYSTEROSCOPIC  . TUBAL LIGATION  10/10/1991  . VAGINAL HYSTERECTOMY N/A 12/10/2012   Procedure: Total Vaginal Hysterectomy;  Surgeon: Eldred Manges, MD;  Location: [facility];  Service: Gynecology;  Laterality: N/A;    reports that she has never smoked. She has never used smokeless tobacco. She reports that she drinks alcohol. She reports that she does not use drugs. family history includes Arthritis in her mother; Breast cancer in her cousin and maternal aunt; Diabetes in her mother; Heart failure in her father; High blood pressure in her mother; Thyroid disease in her sister. Allergies  Allergen Reactions  . Methimazole Anaphylaxis    EMS had  to be called and a 7-day's stay in the hospital resulted  . Nitroglycerin Other (See Comments)    Sweating   . Other Itching and Other (See Comments)    Seasonal allergies & dog hair = runny nose also  . Tetracycline Hcl Hypertension  . Promethazine Nausea Only    Made the patient feel like she was going to vomit and it "spaced" her out  . Shrimp [Shellfish Allergy] Itching  . Latex Rash  . Penicillins Other (See Comments)    Has patient had a PCN reaction causing immediate rash, facial/tongue/throat swelling, SOB or lightheadedness with hypotension: Unk Has patient had a PCN reaction causing severe rash involving mucus membranes or skin necrosis: Unk Has patient had a PCN reaction that required hospitalization:  Unk Has patient had a PCN reaction occurring within the last 10 years: Yes If all of the above answers are "NO", then may proceed with Cephalosporin use.   . Tape Rash  . Terbutaline Other (See Comments)    Reaction not recalled (??)      Outpatient Encounter Prescriptions as of 02/28/2017  Medication Sig  . halobetasol (ULTRAVATE) 0.05 % ointment Apply topically 2 (two) times daily.  Marland Kitchen ibuprofen (ADVIL,MOTRIN) 200 MG tablet Take 200-800 mg by mouth daily as needed for moderate pain.   . methocarbamol (ROBAXIN) 500 MG tablet Take 1 tablet (500 mg total) by mouth 4 (four) times daily as needed for muscle spasms.  . naproxen sodium (ANAPROX) 220 MG tablet Take 220 mg by mouth 2 (two) times daily with a meal.  . ondansetron (ZOFRAN ODT) 4 MG disintegrating tablet Take 1 tablet (4 mg total) by mouth every 8 (eight) hours as needed for nausea or vomiting.  . tamsulosin (FLOMAX) 0.4 MG CAPS capsule Take 1 capsule (0.4 mg total) by mouth daily.  . traMADol (ULTRAM) 50 MG tablet Take 1 tablet (50 mg total) by mouth every 6 (six) hours as needed.  . triamcinolone ointment (KENALOG) 0.5 % Apply 1 application topically 2 (two) times daily.  Marland Kitchen EPINEPHrine (EPIPEN 2-PAK) 0.3 mg/0.3 mL IJ SOAJ injection Inject 0.3 mLs (0.3 mg total) into the muscle once. If you develop difficulty breathing (Patient not taking: Reported on 02/28/2017)  . estradiol (VIVELLE-DOT) 0.05 MG/24HR patch estradiol 0.05 mg/24 hr semiweekly transdermal patch  Apply 1 patch twice a week by transdermal route.  . hyoscyamine (LEVSIN SL) 0.125 MG SL tablet Place 1 tablet (0.125 mg total) under the tongue every 4 (four) hours as needed.   Facility-Administered Encounter Medications as of 02/28/2017  Medication  . 0.9 %  sodium chloride infusion     REVIEW OF SYSTEMS  : All other systems reviewed and negative except where noted in the History of Present Illness.   PHYSICAL EXAM: BP 132/82 (BP Location: Left Arm, Patient  Position: Sitting, Cuff Size: Normal)   Pulse 76   Ht 5\' 4"  (1.626 m) Comment: height measured without shoes  Wt 196 lb 4 oz (89 kg)   LMP 12/03/2012   BMI 33.69 kg/m  General: Well developed black female in no acute distress Head: Normocephalic and atraumatic Eyes:  Sclerae anicteric, conjunctiva pink. Ears: Normal auditory acuity Lungs: Clear throughout to auscultation; no increased WOB. Heart: Regular rate and rhythm; no M/R/G. Abdomen: Soft, non-distended.  BS present.  Mild diffuse TTP. Musculoskeletal: Symmetrical with no gross deformities  Skin: No lesions on visible extremities Extremities: No edema  Neurological: Alert oriented x 4, grossly non-focal Psychological:  Alert and cooperative. Normal mood  and affect  ASSESSMENT AND PLAN: *Rectal bleeding:  One episode last week.  Just had colonoscopy last month that was unremarkable.  Bleeding like due to internal hemorrhoids/outlet bleeding.  If bleeding recurs that could try anusol suppositories. *Diffuse abdominal pain and alternating bowel habits:  Likely has IBS.  Will have her try a daily powder fiber supplement such as Benefiber or Citrucel and increase her fluid intake.  Will give levsin SL 0.125 mg to take every 8 hours prn.  Call back/return for ongoing or worsening symptoms.   CC:  [name], NP

## 2017-02-28 NOTE — Patient Instructions (Signed)
A high fiber diet with plenty of fluids (up to 8 glasses of water daily) is suggested to relieve these symptoms.  Metamucil or Citrucel, 1 tablespoon once or twice daily can be used to keep bowels regular if needed.  We have sent the following medications to your pharmacy for you to pick up at your convenience: Levsin SL 0.125 mg every 8 hours as needed.

## 2017-03-07 ENCOUNTER — Ambulatory Visit (INDEPENDENT_AMBULATORY_CARE_PROVIDER_SITE_OTHER): Payer: BLUE CROSS/BLUE SHIELD | Admitting: Adult Health

## 2017-03-07 ENCOUNTER — Encounter: Payer: Self-pay | Admitting: Adult Health

## 2017-03-07 ENCOUNTER — Ambulatory Visit: Payer: BLUE CROSS/BLUE SHIELD | Admitting: Adult Health

## 2017-03-07 VITALS — BP 138/90 | HR 76 | Temp 97.8°F | Ht 64.0 in | Wt 198.6 lb

## 2017-03-07 DIAGNOSIS — Z23 Encounter for immunization: Secondary | ICD-10-CM | POA: Diagnosis not present

## 2017-03-07 DIAGNOSIS — E039 Hypothyroidism, unspecified: Secondary | ICD-10-CM

## 2017-03-07 LAB — T3, FREE: T3, Free: 3.8 pg/mL (ref 2.3–4.2)

## 2017-03-07 LAB — T4, FREE: Free T4: 0.74 ng/dL (ref 0.60–1.60)

## 2017-03-07 LAB — TSH: TSH: 2.58 u[IU]/mL (ref 0.35–4.50)

## 2017-03-07 NOTE — Progress Notes (Signed)
Subjective:    Patient ID: April Gonzalez, female    DOB: 1960/12/02, 56 y.o.   MRN: [id number]  HPI  56 year old female who  has a past medical history of Allergy; Anemia (05/2012); Fibroids; GERD (gastroesophageal reflux disease); H/O uterine prolapse; Heart murmur; Heart valve problem; History of blood transfusion; Hyperlipidemia; Hypertension; Rectal bleeding; Seizures ([place]); and Thyroid disorder.   She presents to the office today for follow up regarding hypothyroidism. Synthroid was stopped 3 months ago as during a chart review it was found that her thyroid levels for the past 4 years were normal, and she was started on Synthroid two years ago. She could not remember why she was started on Synthroid. [place] in 2017 showed a stable thyroid nodule with mildly enlarged gland.   She has no acute complaints today    Review of Systems  Constitutional: Negative.   HENT: Negative.   Respiratory: Negative.   Cardiovascular: Negative.   Gastrointestinal: Negative.   Genitourinary: Negative.   Neurological: Negative.   All other systems reviewed and are negative.  Past Medical History:  Diagnosis Date  . Allergy   . Anemia 05/2012   transfusion  . Fibroids    H/O myomectomy  . GERD (gastroesophageal reflux disease)    rare   . H/O uterine prolapse   . Heart murmur   . Heart valve problem    Blockage  . History of blood transfusion    x 3   . Hyperlipidemia   . Hypertension   . Rectal bleeding    NOTED AT pv 01-09-17- PT STATES with most BM's for the last 2 years, also hurts   . Seizures ([place])    last seizure age 62 or 10 per pt report-01-09-17 no change in this  . Thyroid disorder     Social History   Social History  . Marital status: Married    Spouse name: N/A  . Number of children: 2  . Years of education: 15   Occupational History  . Not on file.   Social History Main Topics  . Smoking status: Never Smoker  . Smokeless tobacco: Never Used  . Alcohol use 0.0 oz/week    Comment: rare  . Drug use: No  . Sexual activity: Yes    Birth control/ protection: Surgical     Comment: BTL    Other Topics Concern  . Not on file   Social History Narrative   She works for [employer]    Married   Has 2 children     Past Surgical History:  Procedure Laterality Date  . ANKLE SURGERY     One plate, seven screws in the right ankle   . BLADDER SUSPENSION N/A 12/10/2012   Procedure: TRANSVAGINAL TAPE (TVT) PROCEDURE;  Surgeon: Delice Lesch, MD;  Location: [facility];  Service: Gynecology;  Laterality: N/A;  . CYSTOCELE REPAIR N/A 12/10/2012   Procedure: ANTERIOR REPAIR (CYSTOCELE);  Surgeon: Eldred Manges, MD;  Location: [facility];  Service: Gynecology;  Laterality: N/A;  . CYSTOSCOPY N/A 12/10/2012   Procedure: CYSTOSCOPY;  Surgeon: Delice Lesch, MD;  Location: [facility];  Service: Gynecology;  Laterality: N/A;  . EXCISION VAGINAL CYST    . MYOMECTOMY  77YRS AGO   HYSTEROSCOPIC  . TUBAL LIGATION  10/10/1991  . VAGINAL HYSTERECTOMY N/A 12/10/2012   Procedure: Total Vaginal Hysterectomy;  Surgeon: Eldred Manges, MD;  Location: [facility];  Service: Gynecology;  Laterality: N/A;    Family History  Problem Relation Age of Onset  . Heart failure Father   . Diabetes Mother   . High blood pressure Mother   . Arthritis Mother   . Breast cancer Maternal Aunt        in her 40's   . Breast cancer Cousin   . Thyroid disease Sister   . Colon cancer Neg Hx   . Colon polyps Neg Hx   . Esophageal cancer Neg Hx   . Rectal cancer Neg Hx   . Stomach cancer Neg Hx     Allergies  Allergen Reactions  . Methimazole Anaphylaxis    EMS had to be called and a 7-day's stay in the hospital resulted  . Nitroglycerin Other (See Comments)    Sweating   . Other Itching and Other (See Comments)    Seasonal allergies & dog hair = runny nose also  . Tetracycline Hcl Hypertension  . Promethazine Nausea Only    Made the patient feel like she was going to vomit and it "spaced" her  out  . Shrimp [Shellfish Allergy] Itching  . Latex Rash  . Penicillins Other (See Comments)    Has patient had a PCN reaction causing immediate rash, facial/tongue/throat swelling, SOB or lightheadedness with hypotension: Unk Has patient had a PCN reaction causing severe rash involving mucus membranes or skin necrosis: Unk Has patient had a PCN reaction that required hospitalization: Unk Has patient had a PCN reaction occurring within the last 10 years: Yes If all of the above answers are "NO", then may proceed with Cephalosporin use.   . Tape Rash  . Terbutaline Other (See Comments)    Reaction not recalled (??)    Current Outpatient Prescriptions on File Prior to Visit  Medication Sig Dispense Refill  . EPINEPHrine (EPIPEN 2-PAK) 0.3 mg/0.3 mL IJ SOAJ injection Inject 0.3 mLs (0.3 mg total) into the muscle once. If you develop difficulty breathing (Patient not taking: Reported on 02/28/2017) 2 Device 0  . estradiol (VIVELLE-DOT) 0.05 MG/24HR patch estradiol 0.05 mg/24 hr semiweekly transdermal patch  Apply 1 patch twice a week by transdermal route.    . halobetasol (ULTRAVATE) 0.05 % ointment Apply topically 2 (two) times daily. 50 g 1  . hyoscyamine (LEVSIN SL) 0.125 MG SL tablet Place 1 tablet (0.125 mg total) under the tongue every 4 (four) hours as needed. 30 tablet 1  . ibuprofen (ADVIL,MOTRIN) 200 MG tablet Take 200-800 mg by mouth daily as needed for moderate pain.     . methocarbamol (ROBAXIN) 500 MG tablet Take 1 tablet (500 mg total) by mouth 4 (four) times daily as needed for muscle spasms. 40 tablet 0  . naproxen sodium (ANAPROX) 220 MG tablet Take 220 mg by mouth 2 (two) times daily with a meal.    . ondansetron (ZOFRAN ODT) 4 MG disintegrating tablet Take 1 tablet (4 mg total) by mouth every 8 (eight) hours as needed for nausea or vomiting. 16 tablet 0  . tamsulosin (FLOMAX) 0.4 MG CAPS capsule Take 1 capsule (0.4 mg total) by mouth daily. 30 capsule 3  . traMADol (ULTRAM)  50 MG tablet Take 1 tablet (50 mg total) by mouth every 6 (six) hours as needed. 15 tablet 0  . triamcinolone ointment (KENALOG) 0.5 % Apply 1 application topically 2 (two) times daily. 30 g 1   Current Facility-Administered Medications on File Prior to Visit  Medication Dose Route Frequency Provider Last Rate Last Dose  . 0.9 %  sodium chloride infusion  500 mL  Intravenous Continuous Doran Stabler, MD        LMP 12/03/2012       Objective:   Physical Exam  Constitutional: She is oriented to person, place, and time. She appears well-developed and well-nourished. No distress.  Neck: Normal range of motion. Neck supple. No thyromegaly present.  Cardiovascular: Normal rate, regular rhythm, normal heart sounds and intact distal pulses.  Exam reveals no gallop and no friction rub.   No murmur heard. Pulmonary/Chest: Effort normal and breath sounds normal. No respiratory distress. She has no wheezes. She has no rales. She exhibits no tenderness.  Neurological: She is alert and oriented to person, place, and time.  Skin: Skin is warm and dry. No rash noted. She is not diaphoretic. No erythema. No pallor.  Psychiatric: She has a normal mood and affect. Her behavior is normal. Judgment and thought content normal.  Nursing note and vitals reviewed.     Assessment & Plan:  1. Hypothyroidism, unspecified type - TSH - T4, Free - T3, Free  2. Need for influenza vaccination  - Flu Vaccine QUAD 6+ mos PF IM (Fluarix Quad PF)   [name], NP

## 2017-03-11 NOTE — Progress Notes (Signed)
Thank you for sending this case to me. I have reviewed the entire note, and the outlined plan seems appropriate.   Skylier Kretschmer Danis, MD  

## 2017-03-19 ENCOUNTER — Other Ambulatory Visit: Payer: Self-pay | Admitting: Adult Health

## 2017-03-19 NOTE — Telephone Encounter (Signed)
Denied.  Pt is no longer taking this medication. 

## 2017-04-04 ENCOUNTER — Encounter: Payer: Self-pay | Admitting: Adult Health

## 2017-04-04 ENCOUNTER — Ambulatory Visit: Payer: BLUE CROSS/BLUE SHIELD | Admitting: Adult Health

## 2017-04-04 VITALS — BP 130/90 | Temp 97.9°F | Wt 197.0 lb

## 2017-04-04 DIAGNOSIS — W57XXXA Bitten or stung by nonvenomous insect and other nonvenomous arthropods, initial encounter: Secondary | ICD-10-CM | POA: Diagnosis not present

## 2017-04-04 DIAGNOSIS — S00462A Insect bite (nonvenomous) of left ear, initial encounter: Secondary | ICD-10-CM

## 2017-04-04 NOTE — Progress Notes (Addendum)
Subjective:    Patient ID: April Gonzalez, female    DOB: 10-01-1960, 56 y.o.   MRN: [id number]  HPI  56 year old female who  has a past medical history of Allergy, Anemia (05/2012), Fibroids, GERD (gastroesophageal reflux disease), H/O uterine prolapse, Heart murmur, Heart valve problem, History of blood transfusion, Hyperlipidemia, Hypertension, Rectal bleeding, Seizures ([place]), and Thyroid disorder.  She presents to the office today for a painful " bump" behind left ear. Was first noticed yesterday. Denies any drainage or fevers.   Review of Systems See HPI   Past Medical History:  Diagnosis Date  . Allergy   . Anemia 05/2012   transfusion  . Fibroids    H/O myomectomy  . GERD (gastroesophageal reflux disease)    rare   . H/O uterine prolapse   . Heart murmur   . Heart valve problem    Blockage  . History of blood transfusion    x 3   . Hyperlipidemia   . Hypertension   . Rectal bleeding    NOTED AT pv 01-09-17- PT STATES with most BM's for the last 2 years, also hurts   . Seizures ([place])    last seizure age 7 or 10 per pt report-01-09-17 no change in this  . Thyroid disorder     Social History   Socioeconomic History  . Marital status: Married    Spouse name: Not on file  . Number of children: 2  . Years of education: 21  . Highest education level: Not on file  Social Needs  . Financial resource strain: Not on file  . Food insecurity - worry: Not on file  . Food insecurity - inability: Not on file  . Transportation needs - medical: Not on file  . Transportation needs - non-medical: Not on file  Occupational History  . Not on file  Tobacco Use  . Smoking status: Never Smoker  . Smokeless tobacco: Never Used  Substance and Sexual Activity  . Alcohol use: Yes    Alcohol/week: 0.0 oz    Comment: rare  . Drug use: No  . Sexual activity: Yes    Birth control/protection: Surgical    Comment: BTL   Other Topics Concern  . Not on file  Social History Narrative   She works for [employer]    Married   Has 2 children     Past Surgical History:  Procedure Laterality Date  . ANKLE SURGERY     One plate, seven screws in the right ankle   . BLADDER SUSPENSION N/A 12/10/2012   Procedure: TRANSVAGINAL TAPE (TVT) PROCEDURE;  Surgeon: Delice Lesch, MD;  Location: [facility];  Service: Gynecology;  Laterality: N/A;  . CYSTOCELE REPAIR N/A 12/10/2012   Procedure: ANTERIOR REPAIR (CYSTOCELE);  Surgeon: Eldred Manges, MD;  Location: [facility];  Service: Gynecology;  Laterality: N/A;  . CYSTOSCOPY N/A 12/10/2012   Procedure: CYSTOSCOPY;  Surgeon: Delice Lesch, MD;  Location: [facility];  Service: Gynecology;  Laterality: N/A;  . EXCISION VAGINAL CYST    . MYOMECTOMY  67YRS AGO   HYSTEROSCOPIC  . TUBAL LIGATION  10/10/1991  . VAGINAL HYSTERECTOMY N/A 12/10/2012   Procedure: Total Vaginal Hysterectomy;  Surgeon: Eldred Manges, MD;  Location: [facility];  Service: Gynecology;  Laterality: N/A;    Family History  Problem Relation Age of Onset  . Heart failure Father   . Diabetes Mother   . High blood pressure Mother   . Arthritis Mother   .  Breast cancer Maternal Aunt        in her 72's   . Breast cancer Cousin   . Thyroid disease Sister   . Colon cancer Neg Hx   . Colon polyps Neg Hx   . Esophageal cancer Neg Hx   . Rectal cancer Neg Hx   . Stomach cancer Neg Hx     Allergies  Allergen Reactions  . Methimazole Anaphylaxis    EMS had to be called and a 7-day's stay in the hospital resulted  . Nitroglycerin Other (See Comments)    Sweating   . Other Itching and Other (See Comments)    Seasonal allergies & dog hair = runny nose also  . Tetracycline Hcl Hypertension  . Promethazine Nausea Only    Made the patient feel like she was going to vomit and it "spaced" her out  . Shrimp [Shellfish Allergy] Itching  . Latex Rash  . Penicillins Other (See Comments)    Has patient had a PCN reaction causing immediate rash, facial/tongue/throat swelling, SOB or  lightheadedness with hypotension: Unk Has patient had a PCN reaction causing severe rash involving mucus membranes or skin necrosis: Unk Has patient had a PCN reaction that required hospitalization: Unk Has patient had a PCN reaction occurring within the last 10 years: Yes If all of the above answers are "NO", then may proceed with Cephalosporin use.   . Tape Rash  . Terbutaline Other (See Comments)    Reaction not recalled (??)    Current Outpatient Medications on File Prior to Visit  Medication Sig Dispense Refill  . EPINEPHrine (EPIPEN 2-PAK) 0.3 mg/0.3 mL IJ SOAJ injection Inject 0.3 mLs (0.3 mg total) into the muscle once. If you develop difficulty breathing 2 Device 0  . halobetasol (ULTRAVATE) 0.05 % ointment Apply topically 2 (two) times daily. 50 g 1  . ibuprofen (ADVIL,MOTRIN) 200 MG tablet Take 200-800 mg by mouth daily as needed for moderate pain.     Marland Kitchen estradiol (VIVELLE-DOT) 0.05 MG/24HR patch estradiol 0.05 mg/24 hr semiweekly transdermal patch  Apply 1 patch twice a week by transdermal route.    . hyoscyamine (LEVSIN SL) 0.125 MG SL tablet Place 1 tablet (0.125 mg total) under the tongue every 4 (four) hours as needed. (Patient not taking: Reported on 04/04/2017) 30 tablet 1  . methocarbamol (ROBAXIN) 500 MG tablet Take 1 tablet (500 mg total) by mouth 4 (four) times daily as needed for muscle spasms. (Patient not taking: Reported on 04/04/2017) 40 tablet 0   Current Facility-Administered Medications on File Prior to Visit  Medication Dose Route Frequency Provider Last Rate Last Dose  . 0.9 %  sodium chloride infusion  500 mL Intravenous Continuous Danis, Estill Cotta III, MD        BP 130/90 (BP Location: Left Arm)   Temp 97.9 F (36.6 C) (Oral)   Wt 197 lb (89.4 kg)   LMP 12/03/2012   BMI 33.81 kg/m       Objective:   Physical Exam  Constitutional: She is oriented to person, place, and time. She appears well-developed and well-nourished. No distress.    Cardiovascular: Normal rate, regular rhythm, normal heart sounds and intact distal pulses. Exam reveals no friction rub.  No murmur heard. Pulmonary/Chest: Effort normal and breath sounds normal. No respiratory distress. She has no wheezes. She has no rales. She exhibits no tenderness.  Neurological: She is alert and oriented to person, place, and time.  Skin: Skin is warm and dry. She is  not diaphoretic. There is erythema.  Area of erythema behind left ear. Appears as bug bite  Psychiatric: She has a normal mood and affect. Her behavior is normal. Judgment and thought content normal.  Nursing note and vitals reviewed.     Assessment & Plan:  1. Nonvenomous insect bite of left ear, initial encounter - Reassured patient that this appeared as a bug bite. She can apply anti itch cream but should resolve in a few days.  - Follow up if symptoms worsen   [name], NP

## 2017-04-21 ENCOUNTER — Encounter: Payer: Self-pay | Admitting: Nurse Practitioner

## 2017-04-21 ENCOUNTER — Ambulatory Visit: Payer: BLUE CROSS/BLUE SHIELD | Admitting: Nurse Practitioner

## 2017-04-21 VITALS — BP 130/86 | HR 75 | Temp 97.8°F | Ht 64.0 in | Wt 196.0 lb

## 2017-04-21 DIAGNOSIS — M722 Plantar fascial fibromatosis: Secondary | ICD-10-CM

## 2017-04-21 MED ORDER — NAPROXEN 500 MG PO TABS: 500.0000 mg | ORAL_TABLET | Freq: Two times a day (BID) | ORAL | 0 refills | Status: DC | PRN

## 2017-04-21 MED ORDER — DICLOFENAC SODIUM 2 % TD SOLN: 1.0000 [in_us] | Freq: Two times a day (BID) | TRANSDERMAL | 0 refills | Status: DC | PRN

## 2017-04-21 NOTE — Patient Instructions (Signed)
Plantar Fasciitis Plantar fasciitis is a painful foot condition that affects the heel. It occurs when the band of tissue that connects the toes to the heel bone (plantar fascia) becomes irritated. This can happen after exercising too much or doing other repetitive activities (overuse injury). The pain from plantar fasciitis can range from mild irritation to severe pain that makes it difficult for you to walk or move. The pain is usually worse in the morning or after you have been sitting or lying down for a while. What are the causes? This condition may be caused by:  Standing for long periods of time.  Wearing shoes that do not fit.  Doing high-impact activities, including running, aerobics, and ballet.  Being overweight.  Having an abnormal way of walking (gait).  Having tight calf muscles.  Having high arches in your feet.  Starting a new athletic activity.  What are the signs or symptoms? The main symptom of this condition is heel pain. Other symptoms include:  Pain that gets worse after activity or exercise.  Pain that is worse in the morning or after resting.  Pain that goes away after you walk for a few minutes.  How is this diagnosed? This condition may be diagnosed based on your signs and symptoms. Your health care provider will also do a physical exam to check for:  A tender area on the bottom of your foot.  A high arch in your foot.  Pain when you move your foot.  Difficulty moving your foot.  You may also need to have imaging studies to confirm the diagnosis. These can include:  X-rays.  Ultrasound.  MRI.  How is this treated? Treatment for plantar fasciitis depends on the severity of the condition. Your treatment may include:  Rest, ice, and over-the-counter pain medicines to manage your pain.  Exercises to stretch your calves and your plantar fascia.  A splint that holds your foot in a stretched, upward position while you sleep (night  splint).  Physical therapy to relieve symptoms and prevent problems in the future.  Cortisone injections to relieve severe pain.  Extracorporeal shock wave therapy (ESWT) to stimulate damaged plantar fascia with electrical impulses. It is often used as a last resort before surgery.  Surgery, if other treatments have not worked after 12 months.  Follow these instructions at home:  Take medicines only as directed by your health care provider.  Avoid activities that cause pain.  Roll the bottom of your foot over a bag of ice or a bottle of cold water. Do this for 20 minutes, 3-4 times a day.  Perform simple stretches as directed by your health care provider.  Try wearing athletic shoes with air-sole or gel-sole cushions or soft shoe inserts.  Wear a night splint while sleeping, if directed by your health care provider.  Keep all follow-up appointments with your health care provider. How is this prevented?  Do not perform exercises or activities that cause heel pain.  Consider finding low-impact activities if you continue to have problems.  Lose weight if you need to. The best way to prevent plantar fasciitis is to avoid the activities that aggravate your plantar fascia. Contact a health care provider if:  Your symptoms do not go away after treatment with home care measures.  Your pain gets worse.  Your pain affects your ability to move or do your daily activities. This information is not intended to replace advice given to you by your health care provider. Make sure you   discuss any questions you have with your health care provider. Document Released: 01/15/2001 Document Revised: 09/25/2015 Document Reviewed: 03/02/2014 Elsevier Interactive Patient Education  2018 [employer]   Plantar Fasciitis Rehab Ask your health care provider which exercises are safe for you. Do exercises exactly as told by your health care provider and adjust them as directed. It is normal to feel  mild stretching, pulling, tightness, or discomfort as you do these exercises, but you should stop right away if you feel sudden pain or your pain gets worse. Do not begin these exercises until told by your health care provider. Stretching and range of motion exercises These exercises warm up your muscles and joints and improve the movement and flexibility of your foot. These exercises also help to relieve pain. Exercise A: Plantar fascia stretch  1. Sit with your left / right leg crossed over your opposite knee. 2. Hold your heel with one hand with that thumb near your arch. With your other hand, hold your toes and gently pull them back toward the top of your foot. You should feel a stretch on the bottom of your toes or your foot or both. 3. Hold this stretch for__________ seconds. 4. Slowly release your toes and return to the starting position. Repeat __________ times. Complete this exercise __________ times a day. Exercise B: Gastroc, standing  1. Stand with your hands against a wall. 2. Extend your left / right leg behind you, and bend your front knee slightly. 3. Keeping your heels on the floor and keeping your back knee straight, shift your weight toward the wall without arching your back. You should feel a gentle stretch in your left / right calf. 4. Hold this position for __________ seconds. Repeat __________ times. Complete this exercise __________ times a day. Exercise C: Soleus, standing 1. Stand with your hands against a wall. 2. Extend your left / right leg behind you, and bend your front knee slightly. 3. Keeping your heels on the floor, bend your back knee and slightly shift your weight over the back leg. You should feel a gentle stretch deep in your calf. 4. Hold this position for __________ seconds. Repeat __________ times. Complete this exercise __________ times a day. Exercise D: Gastrocsoleus, standing 1. Stand with the ball of your left / right foot on a step. The ball of  your foot is on the walking surface, right under your toes. 2. Keep your other foot firmly on the same step. 3. Hold onto the wall or a railing for balance. 4. Slowly lift your other foot, allowing your body weight to press your heel down over the edge of the step. You should feel a stretch in your left / right calf. 5. Hold this position for __________ seconds. 6. Return both feet to the step. 7. Repeat this exercise with a slight bend in your left / right knee. Repeat __________ times with your left / right knee straight and __________ times with your left / right knee bent. Complete this exercise __________ times a day. Balance exercise This exercise builds your balance and strength control of your arch to help take pressure off your plantar fascia. Exercise E: Single leg stand 1. Without shoes, stand near a railing or in a doorway. You may hold onto the railing or door frame as needed. 2. Stand on your left / right foot. Keep your big toe down on the floor and try to keep your arch lifted. Do not let your foot roll inward. 3. Hold   this position for __________ seconds. 4. If this exercise is too easy, you can try it with your eyes closed or while standing on a pillow. Repeat __________ times. Complete this exercise __________ times a day. This information is not intended to replace advice given to you by your health care provider. Make sure you discuss any questions you have with your health care provider. Document Released: 04/22/2005 Document Revised: 12/26/2015 Document Reviewed: 03/06/2015 Elsevier Interactive Patient Education  2018 Elsevier Inc.   
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
## 2017-04-23 ENCOUNTER — Telehealth: Payer: Self-pay | Admitting: Adult Health

## 2017-04-23 NOTE — Telephone Encounter (Signed)
Patient saw dentist today and needed an extraction.  When they checked her b/p prior to procedure it was 185/106. They waited 5 minutes and b/p was 159/97. 5 minutes later b/p was 164/102.  She denies any hypertension symptoms.

## 2017-04-24 ENCOUNTER — Encounter: Payer: Self-pay | Admitting: Adult Health

## 2017-04-24 ENCOUNTER — Ambulatory Visit (INDEPENDENT_AMBULATORY_CARE_PROVIDER_SITE_OTHER): Payer: BLUE CROSS/BLUE SHIELD | Admitting: Adult Health

## 2017-04-24 VITALS — BP 138/94 | Temp 98.2°F | Wt 197.0 lb

## 2017-04-24 DIAGNOSIS — R030 Elevated blood-pressure reading, without diagnosis of hypertension: Secondary | ICD-10-CM

## 2017-04-24 NOTE — Progress Notes (Signed)
Subjective:    Patient ID: April Gonzalez, female    DOB: [birth year], 56 y.o.   MRN: [id number]  HPI  56 year old female who  has a past medical history of Allergy, Anemia (05/2012), Fibroids, GERD (gastroesophageal reflux disease), H/O uterine prolapse, Heart murmur, Heart valve problem, History of blood transfusion, Hyperlipidemia, Hypertension, Rectal bleeding, Seizures ([facility]), and Thyroid disorder.  She presents to the office today for concern of elevated blood pressure readings. She reports that she was at her dentist yesterday when her BP was 185/118 on first check and then 154/98 on recheck. She was asymptomatic. She reports that they use a wrist cuff at her dental office   BP Readings from Last 3 Encounters:  04/24/17 (!) 138/94  04/21/17 130/86  04/04/17 130/90     Review of Systems See HPI   Past Medical History:  Diagnosis Date  . Allergy   . Anemia 05/2012   transfusion  . Fibroids    H/O myomectomy  . GERD (gastroesophageal reflux disease)    rare   . H/O uterine prolapse   . Heart murmur   . Heart valve problem    Blockage  . History of blood transfusion    x 3   . Hyperlipidemia   . Hypertension   . Rectal bleeding    NOTED AT pv 01-09-17- PT STATES with most BM's for the last 2 years, also hurts   . Seizures ([place])    last seizure age 80 or 10 per pt report-01-09-17 no change in this  . Thyroid disorder     Social History   Socioeconomic History  . Marital status: Married    Spouse name: Not on file  . Number of children: 2  . Years of education: 14  . Highest education level: Not on file  Social Needs  . Financial resource strain: Not on file  . Food insecurity - worry: Not on file  . Food insecurity - inability: Not on file  . Transportation needs - medical: Not on file  . Transportation needs - non-medical: Not on file  Occupational History  . Not on file  Tobacco Use  . Smoking status: Never Smoker  . Smokeless tobacco: Never Used  Substance  and Sexual Activity  . Alcohol use: Yes    Alcohol/week: 0.0 oz    Comment: rare  . Drug use: No  . Sexual activity: Yes    Birth control/protection: Surgical    Comment: BTL   Other Topics Concern  . Not on file  Social History Narrative   She works for [employer]    Married   Has 2 children     Past Surgical History:  Procedure Laterality Date  . ANKLE SURGERY     One plate, seven screws in the right ankle   . BLADDER SUSPENSION N/A 12/10/2012   Procedure: TRANSVAGINAL TAPE (TVT) PROCEDURE;  Surgeon: Delice Lesch, MD;  Location: [facility];  Service: Gynecology;  Laterality: N/A;  . CYSTOCELE REPAIR N/A 12/10/2012   Procedure: ANTERIOR REPAIR (CYSTOCELE);  Surgeon: Eldred Manges, MD;  Location: [facility];  Service: Gynecology;  Laterality: N/A;  . CYSTOSCOPY N/A 12/10/2012   Procedure: CYSTOSCOPY;  Surgeon: Delice Lesch, MD;  Location: [facility];  Service: Gynecology;  Laterality: N/A;  . EXCISION VAGINAL CYST    . MYOMECTOMY  79YRS AGO   HYSTEROSCOPIC  . TUBAL LIGATION  10/10/1991  . VAGINAL HYSTERECTOMY N/A 12/10/2012   Procedure: Total Vaginal Hysterectomy;  Surgeon: Eldred Manges, MD;  Location: [facility];  Service: Gynecology;  Laterality: N/A;    Family History  Problem Relation Age of Onset  . Heart failure Father   . Diabetes Mother   . High blood pressure Mother   . Arthritis Mother   . Breast cancer Maternal Aunt        in her 88's   . Breast cancer Cousin   . Thyroid disease Sister   . Colon cancer Neg Hx   . Colon polyps Neg Hx   . Esophageal cancer Neg Hx   . Rectal cancer Neg Hx   . Stomach cancer Neg Hx     Allergies  Allergen Reactions  . Methimazole Anaphylaxis    EMS had to be called and a 7-day's stay in the hospital resulted  . Nitroglycerin Other (See Comments)    Sweating   . Other Itching and Other (See Comments)    Seasonal allergies & dog hair = runny nose also  . Tetracycline Hcl Hypertension  . Promethazine Nausea Only    Made the  patient feel like she was going to vomit and it "spaced" her out  . Shrimp [Shellfish Allergy] Itching  . Latex Rash  . Penicillins Other (See Comments)    Has patient had a PCN reaction causing immediate rash, facial/tongue/throat swelling, SOB or lightheadedness with hypotension: Unk Has patient had a PCN reaction causing severe rash involving mucus membranes or skin necrosis: Unk Has patient had a PCN reaction that required hospitalization: Unk Has patient had a PCN reaction occurring within the last 10 years: Yes If all of the above answers are "NO", then may proceed with Cephalosporin use.   . Tape Rash  . Terbutaline Other (See Comments)    Reaction not recalled (??)    Current Outpatient Medications on File Prior to Visit  Medication Sig Dispense Refill  . Diclofenac Sodium (PENNSAID) 2 % SOLN Place 1 inch onto the skin 2 (two) times daily as needed. 112 g 0  . EPINEPHrine (EPIPEN 2-PAK) 0.3 mg/0.3 mL IJ SOAJ injection Inject 0.3 mLs (0.3 mg total) into the muscle once. If you develop difficulty breathing 2 Device 0  . estradiol (VIVELLE-DOT) 0.05 MG/24HR patch estradiol 0.05 mg/24 hr semiweekly transdermal patch  Apply 1 patch twice a week by transdermal route.    . halobetasol (ULTRAVATE) 0.05 % ointment Apply topically 2 (two) times daily. 50 g 1  . hyoscyamine (LEVSIN SL) 0.125 MG SL tablet Place 1 tablet (0.125 mg total) under the tongue every 4 (four) hours as needed. 30 tablet 1  . ibuprofen (ADVIL,MOTRIN) 200 MG tablet Take 200-800 mg by mouth daily as needed for moderate pain.     . methocarbamol (ROBAXIN) 500 MG tablet Take 1 tablet (500 mg total) by mouth 4 (four) times daily as needed for muscle spasms. 40 tablet 0  . naproxen (NAPROSYN) 500 MG tablet Take 1 tablet (500 mg total) by mouth 2 (two) times daily as needed (for pain, take with food). 30 tablet 0   Current Facility-Administered Medications on File Prior to Visit  Medication Dose Route Frequency Provider Last  Rate Last Dose  . 0.9 %  sodium chloride infusion  500 mL Intravenous Continuous Nelida Meuse III, MD        BP (!) 138/94 (BP Location: Left Arm)   Temp 98.2 F (36.8 C) (Oral)   Wt 197 lb (89.4 kg)   LMP 12/03/2012   BMI 33.81 kg/m  Objective:   Physical Exam  Constitutional: She is oriented to person, place, and time. She appears well-developed and well-nourished. No distress.  Cardiovascular: Normal rate, regular rhythm, normal heart sounds and intact distal pulses. Exam reveals no gallop.  No murmur heard. Pulmonary/Chest: Effort normal and breath sounds normal. No respiratory distress. She has no wheezes. She has no rales. She exhibits no tenderness.  Neurological: She is alert and oriented to person, place, and time.  Skin: Skin is warm and dry. No rash noted. She is not diaphoretic. No erythema. No pallor.  Psychiatric: Her behavior is normal. Judgment and thought content normal. Her mood appears anxious.  Nursing note and vitals reviewed.     Assessment & Plan:  1. Elevated blood pressure, situational - slightly elevated past her baseline today  - She is anxious about her blood pressure readings at the dental office  - Advised to go home and monitor BP  - Return precautions given   [name], NP

## 2017-05-23 ENCOUNTER — Telehealth: Payer: Self-pay | Admitting: Adult Health

## 2017-05-23 ENCOUNTER — Encounter: Payer: Self-pay | Admitting: *Deleted

## 2017-05-23 NOTE — Telephone Encounter (Signed)
Copied from [place] [id number]. Topic: Quick Communication - See Telephone Encounter >> May 23, 2017  4:39 PM Conception Chancy, NT wrote: CRM for notification. See Telephone encounter for:  05/23/17.  Patient is calling stating she had her teeth removed on Tuesday and states her left side gum is really swollen and thinking possible infection. Wants to know if there was anything [name] could help her with.   [place]

## 2017-05-23 NOTE — Telephone Encounter (Signed)
Cory NP recommends Saturday clinic.

## 2017-05-23 NOTE — Telephone Encounter (Signed)
Pt notified of instructions and verbalized understanding. Saturday Clinic appt scheduled w/ Dr. Anitra Lauth.

## 2017-05-24 ENCOUNTER — Ambulatory Visit (INDEPENDENT_AMBULATORY_CARE_PROVIDER_SITE_OTHER): Payer: BLUE CROSS/BLUE SHIELD | Admitting: Family Medicine

## 2017-05-24 ENCOUNTER — Encounter: Payer: Self-pay | Admitting: Family Medicine

## 2017-05-24 VITALS — BP 132/86 | HR 90 | Temp 98.0°F | Ht 64.0 in | Wt 196.1 lb

## 2017-05-24 DIAGNOSIS — K1379 Other lesions of oral mucosa: Secondary | ICD-10-CM

## 2017-05-24 MED ORDER — TRAMADOL HCL 50 MG PO TABS: ORAL_TABLET | ORAL | 0 refills | Status: DC

## 2017-05-24 NOTE — Progress Notes (Signed)
OFFICE VISIT  05/24/2017   CC: GUM complaints.  HPI:    Patient is a 57 y.o.  female who presents for mild recent gum swelling and pain. She had dental work recently on L side 5 d/a (upper molar on L extracted + "root extracted".  Sutures were placed in gingiva/upper alveolar ridge.  Pt had to return to dentist same day to get hemostasis pad/gel applied due to excessive bleeding).    Soon after the procedure she noted some swelling in upper gingival region at central incisors region and some "white sfuff" Swelling has gone down back to normal but having discomfort in the area of surgery and gums over central incisors.  No bleeding. She has been instructed by her dentist to do no special rinsing at this time. No fevers, chills, ST, or difficulty swallowing.  She has taken some naproxen for the pain--helps minimally.  Past Medical History:  Diagnosis Date  . Allergy   . Anemia 05/2012   transfusion  . Fibroids    H/O myomectomy  . GERD (gastroesophageal reflux disease)    rare   . H/O uterine prolapse   . Heart murmur   . Heart valve problem    Blockage  . History of blood transfusion    x 3   . Hyperlipidemia   . Hypertension   . Rectal bleeding    NOTED AT pv 01-09-17- PT STATES with most BM's for the last 2 years, also hurts   . Seizures ([place])    last seizure age 17 or 10 per pt report-01-09-17 no change in this  . Thyroid disorder     Past Surgical History:  Procedure Laterality Date  . ANKLE SURGERY     One plate, seven screws in the right ankle   . BLADDER SUSPENSION N/A 12/10/2012   Procedure: TRANSVAGINAL TAPE (TVT) PROCEDURE;  Surgeon: Delice Lesch, MD;  Location: [facility];  Service: Gynecology;  Laterality: N/A;  . CYSTOCELE REPAIR N/A 12/10/2012   Procedure: ANTERIOR REPAIR (CYSTOCELE);  Surgeon: Eldred Manges, MD;  Location: [facility];  Service: Gynecology;  Laterality: N/A;  . CYSTOSCOPY N/A 12/10/2012   Procedure: CYSTOSCOPY;  Surgeon: Delice Lesch, MD;   Location: [facility];  Service: Gynecology;  Laterality: N/A;  . EXCISION VAGINAL CYST    . MYOMECTOMY  22YRS AGO   HYSTEROSCOPIC  . TUBAL LIGATION  10/10/1991  . VAGINAL HYSTERECTOMY N/A 12/10/2012   Procedure: Total Vaginal Hysterectomy;  Surgeon: Eldred Manges, MD;  Location: [facility];  Service: Gynecology;  Laterality: N/A;    Outpatient Medications Prior to Visit  Medication Sig Dispense Refill  . Diclofenac Sodium (PENNSAID) 2 % SOLN Place 1 inch onto the skin 2 (two) times daily as needed. 112 g 0  . EPINEPHrine (EPIPEN 2-PAK) 0.3 mg/0.3 mL IJ SOAJ injection Inject 0.3 mLs (0.3 mg total) into the muscle once. If you develop difficulty breathing 2 Device 0  . estradiol (VIVELLE-DOT) 0.05 MG/24HR patch estradiol 0.05 mg/24 hr semiweekly transdermal patch  Apply 1 patch twice a week by transdermal route.    . halobetasol (ULTRAVATE) 0.05 % ointment Apply topically 2 (two) times daily. 50 g 1  . hyoscyamine (LEVSIN SL) 0.125 MG SL tablet Place 1 tablet (0.125 mg total) under the tongue every 4 (four) hours as needed. 30 tablet 1  . ibuprofen (ADVIL,MOTRIN) 200 MG tablet Take 200-800 mg by mouth daily as needed for moderate pain.     . methocarbamol (ROBAXIN) 500 MG tablet  Take 1 tablet (500 mg total) by mouth 4 (four) times daily as needed for muscle spasms. 40 tablet 0  . naproxen (NAPROSYN) 500 MG tablet Take 1 tablet (500 mg total) by mouth 2 (two) times daily as needed (for pain, take with food). 30 tablet 0   Facility-Administered Medications Prior to Visit  Medication Dose Route Frequency Provider Last Rate Last Dose  . 0.9 %  sodium chloride infusion  500 mL Intravenous Continuous Doran Stabler, MD        Allergies  Allergen Reactions  . Methimazole Anaphylaxis    EMS had to be called and a 7-day's stay in the hospital resulted  . Nitroglycerin Other (See Comments)    Sweating   . Other Itching and Other (See Comments)    Seasonal allergies & dog hair = runny nose also  .  Tetracycline Hcl Hypertension  . Promethazine Nausea Only    Made the patient feel like she was going to vomit and it "spaced" her out  . Shrimp [Shellfish Allergy] Itching  . Latex Rash  . Penicillins Other (See Comments)    Has patient had a PCN reaction causing immediate rash, facial/tongue/throat swelling, SOB or lightheadedness with hypotension: Unk Has patient had a PCN reaction causing severe rash involving mucus membranes or skin necrosis: Unk Has patient had a PCN reaction that required hospitalization: Unk Has patient had a PCN reaction occurring within the last 10 years: Yes If all of the above answers are "NO", then may proceed with Cephalosporin use.   . Tape Rash  . Terbutaline Other (See Comments)    Reaction not recalled (??)    ROS As per HPI  PE: Blood pressure 132/86, pulse 90, temperature 98 F (36.7 C), temperature source Oral, height 5\' 4"  (1.626 m), weight 196 lb 1.3 oz (88.9 kg), last menstrual period 12/03/2012, SpO2 95 %. Gen: Alert, well appearing.  Patient is oriented to person, place, time, and situation. Oral cavity moist and pink. L maxillar molar pit with a bit of hematoma present appropriate for recent tooth extraction.  No gingival swelling or erythema.  Sutures in place/intact.  Very sensitive to touch in region where tooth extracted. No tenderness of central incisors, no swelling of gingiva in this region.  LABS:    Chemistry      Component Value Date/Time   NA 142 01/18/2017 1327   K 3.8 01/18/2017 1327   CL 108 01/18/2017 1327   CO2 21 (L) 01/18/2017 1312   BUN 11 01/18/2017 1327   CREATININE 0.90 01/18/2017 1327      Component Value Date/Time   CALCIUM 9.2 01/18/2017 1312   ALKPHOS 101 01/18/2017 1312   AST 25 01/18/2017 1312   ALT 28 01/18/2017 1312   BILITOT 0.5 01/18/2017 1312     Lab Results  Component Value Date   WBC 8.1 01/18/2017   HGB 15.0 01/18/2017   HCT 44.0 01/18/2017   MCV 86.8 01/18/2017   PLT 193 01/18/2017      IMPRESSION AND PLAN:  Recent post-surgical dental pain, no sign of infection or problems stemming from the dental work. It appears she is just in need of better pain control at this time. She declined strong pain med but agreed to a trial of tramadol 50mg , 1-2 tid prn, #10, no RF. Therapeutic expectations and side effect profile of medication discussed today.  Patient's questions answered. Continue naproxen 440mg  bid with food.  Signs/symptoms to call or return for were reviewed and  pt expressed understanding.  FOLLOW UP: Return if symptoms worsen or fail to improve.  Signed:  Crissie Sickles, MD           05/24/2017

## 2017-06-08 ENCOUNTER — Other Ambulatory Visit: Payer: Self-pay

## 2017-06-08 ENCOUNTER — Encounter (HOSPITAL_COMMUNITY): Payer: Self-pay

## 2017-06-08 ENCOUNTER — Emergency Department (HOSPITAL_COMMUNITY)
Admission: EM | Admit: 2017-06-08 | Discharge: 2017-06-08 | Disposition: A | Discharge status: Home / Self Care | Payer: BLUE CROSS/BLUE SHIELD | Attending: Emergency Medicine | Admitting: Emergency Medicine

## 2017-06-08 ENCOUNTER — Emergency Department (HOSPITAL_COMMUNITY): Payer: BLUE CROSS/BLUE SHIELD

## 2017-06-08 DIAGNOSIS — Z79899 Other long term (current) drug therapy: Secondary | ICD-10-CM | POA: Insufficient documentation

## 2017-06-08 DIAGNOSIS — E039 Hypothyroidism, unspecified: Secondary | ICD-10-CM | POA: Diagnosis not present

## 2017-06-08 DIAGNOSIS — I10 Essential (primary) hypertension: Secondary | ICD-10-CM | POA: Insufficient documentation

## 2017-06-08 DIAGNOSIS — R05 Cough: Secondary | ICD-10-CM | POA: Diagnosis present

## 2017-06-08 DIAGNOSIS — J069 Acute upper respiratory infection, unspecified: Secondary | ICD-10-CM | POA: Diagnosis not present

## 2017-06-08 DIAGNOSIS — Z91040 Latex allergy status: Secondary | ICD-10-CM | POA: Insufficient documentation

## 2017-06-08 LAB — CBC WITH DIFFERENTIAL/PLATELET
BASOS PCT: 0 %
Basophils Absolute: 0 10*3/uL (ref 0.0–0.1)
EOS ABS: 0.1 10*3/uL (ref 0.0–0.7)
Eosinophils Relative: 1 %
HCT: 41.7 % (ref 36.0–46.0)
Hemoglobin: 14 g/dL (ref 12.0–15.0)
Lymphocytes Relative: 12 %
Lymphs Abs: 1.1 10*3/uL (ref 0.7–4.0)
MCH: 29.4 pg (ref 26.0–34.0)
MCHC: 33.6 g/dL (ref 30.0–36.0)
MCV: 87.4 fL (ref 78.0–100.0)
MONO ABS: 0.7 10*3/uL (ref 0.1–1.0)
MONOS PCT: 7 %
NEUTROS PCT: 80 %
Neutro Abs: 7.4 10*3/uL (ref 1.7–7.7)
PLATELETS: 212 10*3/uL (ref 150–400)
RBC: 4.77 MIL/uL (ref 3.87–5.11)
RDW: 12.9 % (ref 11.5–15.5)
WBC: 9.2 10*3/uL (ref 4.0–10.5)

## 2017-06-08 LAB — BASIC METABOLIC PANEL
Anion gap: 11 (ref 5–15)
BUN: 7 mg/dL (ref 6–20)
CALCIUM: 9.4 mg/dL (ref 8.9–10.3)
CO2: 21 mmol/L — ABNORMAL LOW (ref 22–32)
CREATININE: 0.85 mg/dL (ref 0.44–1.00)
Chloride: 104 mmol/L (ref 101–111)
GFR calc non Af Amer: >60 mL/min (ref >60)
Glucose, Bld: 124 mg/dL — ABNORMAL HIGH (ref 65–99)
Potassium: 4.1 mmol/L (ref 3.5–5.1)
SODIUM: 136 mmol/L (ref 135–145)

## 2017-06-08 LAB — INFLUENZA PANEL BY PCR (TYPE A & B)
INFLAPCR: NEGATIVE
Influenza B By PCR: NEGATIVE

## 2017-06-08 MED ORDER — BENZONATATE 100 MG PO CAPS: 100.0000 mg | ORAL_CAPSULE | Freq: Three times a day (TID) | ORAL | 0 refills | Status: DC

## 2017-06-08 MED ORDER — ONDANSETRON HCL 4 MG/2ML IJ SOLN
4.0000 mg | Freq: Once | INTRAMUSCULAR | Status: AC
  Administered 2017-06-08: 4 mg via INTRAVENOUS
  Filled 2017-06-08: qty 2

## 2017-06-08 MED ORDER — KETOROLAC TROMETHAMINE 30 MG/ML IJ SOLN
30.0000 mg | Freq: Once | INTRAMUSCULAR | Status: AC
  Administered 2017-06-08: 30 mg via INTRAVENOUS
  Filled 2017-06-08: qty 1

## 2017-06-08 MED ORDER — SODIUM CHLORIDE 0.9 % IV BOLUS (SEPSIS)
1000.0000 mL | Freq: Once | INTRAVENOUS | Status: AC
  Administered 2017-06-08: 1000 mL via INTRAVENOUS

## 2017-06-08 MED ORDER — PSEUDOEPHEDRINE HCL ER 120 MG PO TB12
120.0000 mg | ORAL_TABLET | Freq: Two times a day (BID) | ORAL | Status: DC
  Administered 2017-06-08: 120 mg via ORAL
  Filled 2017-06-08: qty 1

## 2017-06-08 MED ORDER — PSEUDOEPHEDRINE HCL 30 MG PO TABS: 30.0000 mg | ORAL_TABLET | ORAL | 0 refills | Status: DC | PRN

## 2017-06-08 NOTE — ED Provider Notes (Signed)
[facility] EMERGENCY DEPARTMENT Provider Note   CSN: [id number] Arrival date & time: 06/08/17  0709     History   Chief Complaint Chief Complaint  Patient presents with  . cough/fever    HPI April Gonzalez is a 57 y.o. female.  HPI April Gonzalez is a 57 y.o. female with history of anemia and prior transfusions, thyroid disorder, hypertension, presents to emergency department complaining of flulike symptoms.  Patient reports 3 days of cough, congestion, weakness, dizziness, nausea, headache.  Patient has not been taking any medications for her symptoms at home.  She states she is currently on clindamycin for tooth infection.  She reports loss of appetite, nausea.  Denies any vomiting.  Reported some loose stools, but no watery diarrhea.  Denies any abdominal pain.  States she is having chest pain when coughing only.  Reports cough is productive with thick yellow sputum.  No sick contacts.  Nothing is making her symptoms better or worse.  Currently complaining of severe frontal headache.  Past Medical History:  Diagnosis Date  . Allergy   . Anemia 05/2012   transfusion  . Fibroids    H/O myomectomy  . GERD (gastroesophageal reflux disease)    rare   . H/O uterine prolapse   . Heart murmur   . Heart valve problem    Blockage  . History of blood transfusion    x 3   . Hyperlipidemia   . Hypertension   . Rectal bleeding    NOTED AT pv 01-09-17- PT STATES with most BM's for the last 2 years, also hurts   . Seizures ([place])    last seizure age 78 or 10 per pt report-01-09-17 no change in this  . Thyroid disorder     Patient Active Problem List   Diagnosis Date Noted  . Internal hemorrhoids 02/28/2017  . Irritable bowel syndrome 02/28/2017  . Skin rash 11/30/2016  . Acute non-recurrent maxillary sinusitis 11/09/2016  . Chronic daily headache 07/03/2016  . Osteoarthritis 10/20/2015  . Neck swelling 10/20/2015  . Hypothyroidism 06/28/2015  . Essential hypertension  06/28/2015  . Faintness 04/28/2015  . Gustatory hallucinations 04/28/2015  . Hyperlipidemia 01/17/2015  . Goiter 01/06/2015  . Low vitamin B12 level 01/06/2015  . Diffuse abdominal pain 07/14/2014  . Hematochezia 07/14/2014  . Rectal bleeding 06/21/2014  . Numbness and tingling of foot 06/21/2014  . Dense breasts 07/13/2012  . Seizures ([place])   . Fibroids   . Iron deficiency anemia 01/06/2007  . HEART MURMUR, HX OF 12/04/2006    Past Surgical History:  Procedure Laterality Date  . ANKLE SURGERY     One plate, seven screws in the right ankle   . BLADDER SUSPENSION N/A 12/10/2012   Procedure: TRANSVAGINAL TAPE (TVT) PROCEDURE;  Surgeon: Delice Lesch, MD;  Location: [facility];  Service: Gynecology;  Laterality: N/A;  . CYSTOCELE REPAIR N/A 12/10/2012   Procedure: ANTERIOR REPAIR (CYSTOCELE);  Surgeon: Eldred Manges, MD;  Location: [facility];  Service: Gynecology;  Laterality: N/A;  . CYSTOSCOPY N/A 12/10/2012   Procedure: CYSTOSCOPY;  Surgeon: Delice Lesch, MD;  Location: [facility];  Service: Gynecology;  Laterality: N/A;  . EXCISION VAGINAL CYST    . MYOMECTOMY  70YRS AGO   HYSTEROSCOPIC  . TUBAL LIGATION  10/10/1991  . VAGINAL HYSTERECTOMY N/A 12/10/2012   Procedure: Total Vaginal Hysterectomy;  Surgeon: Eldred Manges, MD;  Location: [facility];  Service: Gynecology;  Laterality: N/A;    OB History  Gravida Para Term Preterm AB Living   2 2 2     2    SAB TAB Ectopic Multiple Live Births                   Home Medications    Prior to Admission medications   Medication Sig Start Date End Date Taking? Authorizing Provider  Diclofenac Sodium (PENNSAID) 2 % SOLN Place 1 inch onto the skin 2 (two) times daily as needed. 04/21/17   Nche, Charlene Brooke, NP  EPINEPHrine (EPIPEN 2-PAK) 0.3 mg/0.3 mL IJ SOAJ injection Inject 0.3 mLs (0.3 mg total) into the muscle once. If you develop difficulty breathing 09/18/15   Debbrah Alar, NP  estradiol (VIVELLE-DOT) 0.05 MG/24HR patch  estradiol 0.05 mg/24 hr semiweekly transdermal patch  Apply 1 patch twice a week by transdermal route.    [provider]  halobetasol (ULTRAVATE) 0.05 % ointment Apply topically 2 (two) times daily. 02/11/17   Marletta Lor, MD  hyoscyamine (LEVSIN SL) 0.125 MG SL tablet Place 1 tablet (0.125 mg total) under the tongue every 4 (four) hours as needed. 02/28/17   Zehr, Laban Emperor, PA-C  ibuprofen (ADVIL,MOTRIN) 200 MG tablet Take 200-800 mg by mouth daily as needed for moderate pain.     [provider]  methocarbamol (ROBAXIN) 500 MG tablet Take 1 tablet (500 mg total) by mouth 4 (four) times daily as needed for muscle spasms. 91/6/38   Delora Fuel, MD  naproxen (NAPROSYN) 500 MG tablet Take 1 tablet (500 mg total) by mouth 2 (two) times daily as needed (for pain, take with food). 04/21/17   Nche, Charlene Brooke, NP  traMADol Veatrice Bourbon) 50 MG tablet 1-2 tabs po tid prn pain 05/24/17   McGowen, Adrian Blackwater, MD    Family History Family History  Problem Relation Age of Onset  . Heart failure Father   . Diabetes Mother   . High blood pressure Mother   . Arthritis Mother   . Breast cancer Maternal Aunt        in her 40's   . Breast cancer Cousin   . Thyroid disease Sister   . Colon cancer Neg Hx   . Colon polyps Neg Hx   . Esophageal cancer Neg Hx   . Rectal cancer Neg Hx   . Stomach cancer Neg Hx     Social History Social History   Tobacco Use  . Smoking status: Never Smoker  . Smokeless tobacco: Never Used  Substance Use Topics  . Alcohol use: Yes    Alcohol/week: 0.0 oz    Comment: rare  . Drug use: No     Allergies   Methimazole; Nitroglycerin; Other; Tetracycline hcl; Promethazine; Shrimp [shellfish allergy]; Latex; Penicillins; Tape; and Terbutaline   Review of Systems Review of Systems  Constitutional: Positive for chills.  HENT: Positive for congestion, ear pain, rhinorrhea, sinus pressure, sinus pain and sore throat.   Respiratory: Positive for  cough. Negative for chest tightness and shortness of breath.   Cardiovascular: Negative for chest pain, palpitations and leg swelling.  Gastrointestinal: Positive for nausea. Negative for abdominal pain, diarrhea and vomiting.  Genitourinary: Negative for dysuria, flank pain, pelvic pain, vaginal bleeding, vaginal discharge and vaginal pain.  Musculoskeletal: Negative for arthralgias, myalgias, neck pain and neck stiffness.  Skin: Negative for rash.  Neurological: Positive for dizziness, light-headedness and headaches. Negative for weakness.  All other systems reviewed and are negative.    Physical Exam Updated Vital Signs BP (!) 156/98 (BP  Location: Right Arm)   Pulse (!) 101   Temp 99.3 F (37.4 C) (Oral)   Resp 16   LMP 12/03/2012   SpO2 95%   Physical Exam  Constitutional: She is oriented to person, place, and time. She appears well-developed and well-nourished. No distress.  HENT:  Head: Normocephalic and atraumatic.  Right Ear: Tympanic membrane, external ear and ear canal normal.  Left Ear: Tympanic membrane, external ear and ear canal normal.  Nose: Mucosal edema and rhinorrhea present.  Mouth/Throat: Uvula is midline and mucous membranes are normal. Posterior oropharyngeal erythema present. No oropharyngeal exudate, posterior oropharyngeal edema or tonsillar abscesses.  Eyes: Conjunctivae are normal.  Neck: Neck supple.  Cardiovascular: Normal rate, regular rhythm, normal heart sounds and intact distal pulses.  Pulmonary/Chest: Effort normal and breath sounds normal. No respiratory distress. She has no wheezes. She has no rales.  Abdominal: Soft. Bowel sounds are normal. She exhibits no distension. There is no tenderness. There is no rebound.  Musculoskeletal: Normal range of motion. She exhibits no edema.  Neurological: She is alert and oriented to person, place, and time.  Skin: Skin is warm and dry.  Psychiatric: She has a normal mood and affect. Her behavior is  normal.  Nursing note and vitals reviewed.    ED Treatments / Results  Labs (all labs ordered are listed, but only abnormal results are displayed) Labs Reviewed  INFLUENZA PANEL BY PCR (TYPE A & B)  CBC WITH DIFFERENTIAL/PLATELET  BASIC METABOLIC PANEL    EKG  EKG Interpretation None       Radiology Dg Chest 2 View  Result Date: 06/08/2017 CLINICAL DATA:  Cough, weakness, rhinorrhea EXAM: CHEST  2 VIEW COMPARISON:  12/12/2015 FINDINGS: Mild left basilar atelectasis. No focal consolidation. No pleural effusion or pneumothorax. The heart is normal in size. Visualized osseous structures are within normal limits. IMPRESSION: No evidence of acute cardiopulmonary disease. Electronically Signed   By: Julian Hy M.D.   On: 06/08/2017 07:41    Procedures Procedures (including critical care time)  Medications Ordered in ED Medications  sodium chloride 0.9 % bolus 1,000 mL (not administered)  ondansetron (ZOFRAN) injection 4 mg (not administered)  ketorolac (TORADOL) 30 MG/ML injection 30 mg (not administered)  pseudoephedrine (SUDAFED) 12 hr tablet 120 mg (not administered)     Initial Impression / Assessment and Plan / ED Course  I have reviewed the triage vital signs and the nursing notes.  Pertinent labs & imaging results that were available during my care of the patient were reviewed by me and considered in my medical decision making (see chart for details).     Patient in the emergency department with flulike symptoms and weakness and dizziness.  Given history of anemia with prior transfusions, will check CBC.  After my examination, when I asked patient to sit up to perform lung exam, patient's heart rate went from 80 to 130.  Suspect dehydration.  Will give IV fluids, Zofran for nausea, Toradol for pain.  Influenza panel ordered.  Chest x-ray negative  11:12 AM Patient is feeling better.  Blood work unremarkable. Influenza negative. Will dc home with symptomatic  treatment and close follow up as needed.   Vitals:   06/08/17 0717 06/08/17 0830 06/08/17 1048  BP: (!) 156/98 (!) 161/93 (!) 151/89  Pulse: (!) 101 90 92  Resp: 16  16  Temp: 99.3 F (37.4 C)  99.2 F (37.3 C)  TempSrc: Oral  Oral  SpO2: 95% 92% 92%  Final Clinical Impressions(s) / ED Diagnoses   Final diagnoses:  Viral upper respiratory tract infection    ED Discharge Orders        Ordered    pseudoephedrine (SUDAFED) 30 MG tablet  Every 4 hours PRN     06/08/17 1113    benzonatate (TESSALON) 100 MG capsule  Every 8 hours     06/08/17 1113       Jeannett Senior, PA-C 06/08/17 1115    Dorie Rank, MD 06/09/17 1954

## 2017-06-08 NOTE — Discharge Instructions (Signed)
Sudafed for congestion. Use saline nasal spray every 1-2 hrs. Ibuprofen or tylenol for body aches and headache. Rest. Drink plenty of fluids. Tessalon for cough. Followup with family doctor in 2-3 days if not improving. Return if worsening.

## 2017-06-08 NOTE — ED Triage Notes (Signed)
Patient complains of cold and cough with throat soreness and tongue burning with left ear pain for nearly 1 week. This am low grade fever. Alert and oriented, NAD

## 2017-07-05 ENCOUNTER — Other Ambulatory Visit: Payer: Self-pay

## 2017-07-05 ENCOUNTER — Ambulatory Visit (HOSPITAL_COMMUNITY)
Admission: EM | Admit: 2017-07-05 | Discharge: 2017-07-05 | Disposition: A | Discharge status: Home / Self Care | Payer: BLUE CROSS/BLUE SHIELD | Attending: Family Medicine | Admitting: Family Medicine

## 2017-07-05 ENCOUNTER — Encounter (HOSPITAL_COMMUNITY): Payer: Self-pay | Admitting: Emergency Medicine

## 2017-07-05 DIAGNOSIS — W57XXXA Bitten or stung by nonvenomous insect and other nonvenomous arthropods, initial encounter: Secondary | ICD-10-CM | POA: Diagnosis not present

## 2017-07-05 DIAGNOSIS — S40261A Insect bite (nonvenomous) of right shoulder, initial encounter: Secondary | ICD-10-CM | POA: Diagnosis not present

## 2017-07-05 DIAGNOSIS — L298 Other pruritus: Secondary | ICD-10-CM | POA: Diagnosis not present

## 2017-07-05 DIAGNOSIS — L299 Pruritus, unspecified: Secondary | ICD-10-CM

## 2017-07-05 MED ORDER — PREDNISONE 10 MG (21) PO TBPK: ORAL_TABLET | Freq: Every day | ORAL | 0 refills | Status: DC

## 2017-07-05 NOTE — ED Triage Notes (Signed)
Hives/insect bite to right shoulder.  Noticed this rash yesterday.    Denies any difficulty breathing.

## 2017-07-10 NOTE — ED Provider Notes (Signed)
[facility]   [id number] 07/05/17 Arrival Time: [place]:  1. Insect bite, initial encounter   2. Itching     Meds ordered this encounter  Medications  . predniSONE (STERAPRED UNI-PAK 21 TAB) 10 MG (21) TBPK tablet    Sig: Take by mouth daily. Take as directed.    Dispense:  21 tablet    Refill:  0    Will f/u with PCP or here if not seeing improvement over the next 24-48 hours.. Reviewed expectations re: course of current medical issues. Questions answered. Outlined signs and symptoms indicating need for more acute intervention. Patient verbalized understanding. After Visit Summary given.   SUBJECTIVE:  April Gonzalez is a 57 y.o. female who presents with a skin complaint.   Location: R shoulder; "a few bumps" Onset: abrupt Duration: 1 day Pruritic? Yes Painful? No Drainage? No  Known trigger? She questions insect bites. New soaps/lotions/topicals/detergents? No Environmental exposures or allergies? none Contacts with similar? No Recent travel? No  Other associated symptoms: none Therapies tried thus far: none Denies arthralgia, fever and myalgia. No specific aggravating or alleviating factors reported.  ROS: As per HPI.  OBJECTIVE: Vitals:   07/05/17 1909 07/05/17 1920  BP:  (!) 140/97  Pulse: 98   Resp: 18   Temp: 97.9 F (36.6 C)   TempSrc: Oral   SpO2: 97%     General appearance: alert; no distress Lungs: clear to auscultation bilaterally Heart: regular rate and rhythm Extremities: no edema Skin: warm and dry; R shoulder; a few circular, localized, raised, red and round indurations; mild tenderness; no areas of fluctuance Psychological: alert and cooperative; normal mood and affect  Allergies  Allergen Reactions  . Methimazole Anaphylaxis    EMS had to be called and a 7-day's stay in the hospital resulted  . Nitroglycerin Other (See Comments)    Sweating   . Other Itching and Other (See Comments)    Seasonal  allergies & dog hair = runny nose also  . Tetracycline Hcl Hypertension  . Promethazine Nausea Only    Made the patient feel like she was going to vomit and it "spaced" her out  . Shrimp [Shellfish Allergy] Itching  . Latex Rash  . Penicillins Other (See Comments)    Has patient had a PCN reaction causing immediate rash, facial/tongue/throat swelling, SOB or lightheadedness with hypotension: Unk Has patient had a PCN reaction causing severe rash involving mucus membranes or skin necrosis: Unk Has patient had a PCN reaction that required hospitalization: Unk Has patient had a PCN reaction occurring within the last 10 years: Yes If all of the above answers are "NO", then may proceed with Cephalosporin use.   . Tape Rash  . Terbutaline Other (See Comments)    Reaction not recalled (??)    Past Medical History:  Diagnosis Date  . Allergy   . Anemia 05/2012   transfusion  . Fibroids    H/O myomectomy  . GERD (gastroesophageal reflux disease)    rare   . H/O uterine prolapse   . Heart murmur   . Heart valve problem    Blockage  . History of blood transfusion    x 3   . Hyperlipidemia   . Hypertension   . Rectal bleeding    NOTED AT pv 01-09-17- PT STATES with most BM's for the last 2 years, also hurts   . Seizures ([place])    last seizure age 49 or 10 per pt report-01-09-17 no change  in this  . Thyroid disorder    Social History   Socioeconomic History  . Marital status: Married    Spouse name: Not on file  . Number of children: 2  . Years of education: 77  . Highest education level: Not on file  Social Needs  . Financial resource strain: Not on file  . Food insecurity - worry: Not on file  . Food insecurity - inability: Not on file  . Transportation needs - medical: Not on file  . Transportation needs - non-medical: Not on file  Occupational History  . Not on file  Tobacco Use  . Smoking status: Never Smoker  . Smokeless tobacco: Never Used  Substance and Sexual Activity    . Alcohol use: Yes    Alcohol/week: 0.0 oz    Comment: rare  . Drug use: No  . Sexual activity: Yes    Birth control/protection: Surgical    Comment: BTL   Other Topics Concern  . Not on file  Social History Narrative   She works for [employer]    Married   Has 2 children    Family History  Problem Relation Age of Onset  . Heart failure Father   . Diabetes Mother   . High blood pressure Mother   . Arthritis Mother   . Breast cancer Maternal Aunt        in her 2's   . Breast cancer Cousin   . Thyroid disease Sister   . Colon cancer Neg Hx   . Colon polyps Neg Hx   . Esophageal cancer Neg Hx   . Rectal cancer Neg Hx   . Stomach cancer Neg Hx    Past Surgical History:  Procedure Laterality Date  . ANKLE SURGERY     One plate, seven screws in the right ankle   . BLADDER SUSPENSION N/A 12/10/2012   Procedure: TRANSVAGINAL TAPE (TVT) PROCEDURE;  Surgeon: Delice Lesch, MD;  Location: [facility];  Service: Gynecology;  Laterality: N/A;  . CYSTOCELE REPAIR N/A 12/10/2012   Procedure: ANTERIOR REPAIR (CYSTOCELE);  Surgeon: Eldred Manges, MD;  Location: [facility];  Service: Gynecology;  Laterality: N/A;  . CYSTOSCOPY N/A 12/10/2012   Procedure: CYSTOSCOPY;  Surgeon: Delice Lesch, MD;  Location: [facility];  Service: Gynecology;  Laterality: N/A;  . EXCISION VAGINAL CYST    . MYOMECTOMY  22YRS AGO   HYSTEROSCOPIC  . TUBAL LIGATION  10/10/1991  . VAGINAL HYSTERECTOMY N/A 12/10/2012   Procedure: Total Vaginal Hysterectomy;  Surgeon: Eldred Manges, MD;  Location: [facility];  Service: Gynecology;  Laterality: N/A;     Vanessa Kick, MD 07/10/17 0900

## 2017-07-29 ENCOUNTER — Telehealth: Payer: Self-pay | Admitting: Neurology

## 2017-09-18 ENCOUNTER — Ambulatory Visit: Payer: BLUE CROSS/BLUE SHIELD | Admitting: Neurology

## 2017-09-18 ENCOUNTER — Encounter

## 2017-09-30 ENCOUNTER — Encounter: Payer: Self-pay | Admitting: Adult Health

## 2017-09-30 ENCOUNTER — Ambulatory Visit (INDEPENDENT_AMBULATORY_CARE_PROVIDER_SITE_OTHER): Payer: BLUE CROSS/BLUE SHIELD | Admitting: Adult Health

## 2017-09-30 VITALS — BP 140/92 | Temp 97.7°F | Wt 198.0 lb

## 2017-09-30 DIAGNOSIS — L989 Disorder of the skin and subcutaneous tissue, unspecified: Secondary | ICD-10-CM

## 2017-09-30 NOTE — Progress Notes (Signed)
Subjective:    Patient ID: April Gonzalez, female    DOB: 1961-05-04, 57 y.o.   MRN: [id number]  HPI 57 year old female who  has a past medical history of Allergy, Anemia (05/2012), Fibroids, GERD (gastroesophageal reflux disease), H/O uterine prolapse, Heart murmur, Heart valve problem, History of blood transfusion, Hyperlipidemia, Hypertension, Rectal bleeding, Seizures ([place]), and Thyroid disorder. She presents to the office today for an acute complaint of " pus coming out of the top of left my hand." reports she noticed this 3 days ago while in the shower. Has not had any issues since. She did not notice any lacerations, abrasions, or signs of infections    Review of Systems See HPI   Past Medical History:  Diagnosis Date  . Allergy   . Anemia 05/2012   transfusion  . Fibroids    H/O myomectomy  . GERD (gastroesophageal reflux disease)    rare   . H/O uterine prolapse   . Heart murmur   . Heart valve problem    Blockage  . History of blood transfusion    x 3   . Hyperlipidemia   . Hypertension   . Rectal bleeding    NOTED AT pv 01-09-17- PT STATES with most BM's for the last 2 years, also hurts   . Seizures ([place])    last seizure age 52 or 10 per pt report-01-09-17 no change in this  . Thyroid disorder     Social History   Socioeconomic History  . Marital status: Married    Spouse name: Not on file  . Number of children: 2  . Years of education: 81  . Highest education level: Not on file  Occupational History  . Not on file  Social Needs  . Financial resource strain: Not on file  . Food insecurity:    Worry: Not on file    Inability: Not on file  . Transportation needs:    Medical: Not on file    Non-medical: Not on file  Tobacco Use  . Smoking status: Never Smoker  . Smokeless tobacco: Never Used  Substance and Sexual Activity  . Alcohol use: Yes    Alcohol/week: 0.0 oz    Comment: rare  . Drug use: No  . Sexual activity: Yes    Birth control/protection:  Surgical    Comment: BTL   Lifestyle  . Physical activity:    Days per week: Not on file    Minutes per session: Not on file  . Stress: Not on file  Relationships  . Social connections:    Talks on phone: Not on file    Gets together: Not on file    Attends religious service: Not on file    Active member of club or organization: Not on file    Attends meetings of clubs or organizations: Not on file    Relationship status: Not on file  . Intimate partner violence:    Fear of current or ex partner: Not on file    Emotionally abused: Not on file    Physically abused: Not on file    Forced sexual activity: Not on file  Other Topics Concern  . Not on file  Social History Narrative   She works for [employer]    Married   Has 2 children     Past Surgical History:  Procedure Laterality Date  . ANKLE SURGERY     One plate, seven screws in the right ankle   .  BLADDER SUSPENSION N/A 12/10/2012   Procedure: TRANSVAGINAL TAPE (TVT) PROCEDURE;  Surgeon: Delice Lesch, MD;  Location: [facility];  Service: Gynecology;  Laterality: N/A;  . CYSTOCELE REPAIR N/A 12/10/2012   Procedure: ANTERIOR REPAIR (CYSTOCELE);  Surgeon: Eldred Manges, MD;  Location: [facility];  Service: Gynecology;  Laterality: N/A;  . CYSTOSCOPY N/A 12/10/2012   Procedure: CYSTOSCOPY;  Surgeon: Delice Lesch, MD;  Location: [facility];  Service: Gynecology;  Laterality: N/A;  . EXCISION VAGINAL CYST    . MYOMECTOMY  63YRS AGO   HYSTEROSCOPIC  . TUBAL LIGATION  10/10/1991  . VAGINAL HYSTERECTOMY N/A 12/10/2012   Procedure: Total Vaginal Hysterectomy;  Surgeon: Eldred Manges, MD;  Location: [facility];  Service: Gynecology;  Laterality: N/A;    Family History  Problem Relation Age of Onset  . Heart failure Father   . Diabetes Mother   . High blood pressure Mother   . Arthritis Mother   . Breast cancer Maternal Aunt        in her 54's   . Breast cancer Cousin   . Thyroid disease Sister   . Colon cancer Neg Hx   . Colon  polyps Neg Hx   . Esophageal cancer Neg Hx   . Rectal cancer Neg Hx   . Stomach cancer Neg Hx     Allergies  Allergen Reactions  . Methimazole Anaphylaxis    EMS had to be called and a 7-day's stay in the hospital resulted  . Nitroglycerin Other (See Comments)    Sweating   . Other Itching and Other (See Comments)    Seasonal allergies & dog hair = runny nose also  . Tetracycline Hcl Hypertension  . Promethazine Nausea Only    Made the patient feel like she was going to vomit and it "spaced" her out  . Shrimp [Shellfish Allergy] Itching  . Latex Rash  . Penicillins Other (See Comments)    Has patient had a PCN reaction causing immediate rash, facial/tongue/throat swelling, SOB or lightheadedness with hypotension: Unk Has patient had a PCN reaction causing severe rash involving mucus membranes or skin necrosis: Unk Has patient had a PCN reaction that required hospitalization: Unk Has patient had a PCN reaction occurring within the last 10 years: Yes If all of the above answers are "NO", then may proceed with Cephalosporin use.   . Tape Rash  . Terbutaline Other (See Comments)    Reaction not recalled (??)    Current Outpatient Medications on File Prior to Visit  Medication Sig Dispense Refill  . EPINEPHrine (EPIPEN 2-PAK) 0.3 mg/0.3 mL IJ SOAJ injection Inject 0.3 mLs (0.3 mg total) into the muscle once. If you develop difficulty breathing 2 Device 0  . halobetasol (ULTRAVATE) 0.05 % ointment Apply topically 2 (two) times daily. 50 g 1  . hyoscyamine (LEVSIN SL) 0.125 MG SL tablet Place 1 tablet (0.125 mg total) under the tongue every 4 (four) hours as needed. 30 tablet 1  . ibuprofen (ADVIL,MOTRIN) 200 MG tablet Take 200-800 mg by mouth daily as needed for moderate pain.     . predniSONE (STERAPRED UNI-PAK 21 TAB) 10 MG (21) TBPK tablet Take by mouth daily. Take as directed. 21 tablet 0  . pseudoephedrine (SUDAFED) 30 MG tablet Take 1 tablet (30 mg total) by mouth every 4  (four) hours as needed for congestion. 30 tablet 0  . traMADol (ULTRAM) 50 MG tablet 1-2 tabs po tid prn pain 10 tablet 0   Current Facility-Administered  Medications on File Prior to Visit  Medication Dose Route Frequency Provider Last Rate Last Dose  . 0.9 %  sodium chloride infusion  500 mL Intravenous Continuous Danis, Estill Cotta III, MD        BP (!) 140/92   Temp 97.7 F (36.5 C) (Oral)   Wt 198 lb (89.8 kg)   LMP 12/03/2012   BMI 33.99 kg/m       Objective:   Physical Exam  Constitutional: She is oriented to person, place, and time. She appears well-developed and well-nourished. No distress.  Musculoskeletal: Normal range of motion. She exhibits no edema, tenderness or deformity.  Neurological: She is alert and oriented to person, place, and time.  Skin: Skin is warm and dry. Capillary refill takes less than 2 seconds. She is not diaphoretic. No erythema.  Psychiatric: She has a normal mood and affect. Her behavior is normal. Judgment and thought content normal.  Nursing note and vitals reviewed.     Assessment & Plan:  1. Skin abnormalities - No abrasions, lacerations, signs of infection, or any other abnormality of left hand   [name], NP

## 2017-12-04 ENCOUNTER — Telehealth: Payer: Self-pay | Admitting: Adult Health

## 2017-12-04 ENCOUNTER — Encounter: Payer: Self-pay | Admitting: Adult Health

## 2017-12-04 NOTE — Telephone Encounter (Signed)
Copied from [place] [id number]. Topic: Quick Communication - See Telephone Encounter >> Dec 04, 2017  1:32 PM Ahmed Prima L wrote: CRM for notification. See Telephone encounter for: 12/04/17.  Patient called and stated she is filling out some forms for her insurance and needs to know the following questions ::  She needs to know when she was diagnosed with her heart mumar and heart valve issue? She needs the date last seen for follow up on heart mumar and what follow up has been recommended?  If there has not been a follow up, please explain why?  Patient said please leave on her voicemail if you do not reach her.

## 2017-12-04 NOTE — Telephone Encounter (Signed)
Cory, please remove from chart.

## 2017-12-04 NOTE — Telephone Encounter (Signed)
Spoke to the pt and informed her of message below. Will have Cory remove from her chart.  All questions answered and nothing else further needed at this time.  Pt to call back if needed.

## 2017-12-04 NOTE — Telephone Encounter (Signed)
Tommi Rumps, I do not see this on the list of problems.  Have you seen her for this?

## 2017-12-04 NOTE — Telephone Encounter (Signed)
From what I can tell the history of heart murmur goes back as far as 2008, this possibly could have been due to anemia in the past.  I do have never heard a heart murmur on her.  I will delete this from her profile.  Does have a history of heart valve issue but most recent echocardiogram done in 2018 was completely normal.  I think this might of been an error in entry and will be removed

## 2017-12-09 ENCOUNTER — Ambulatory Visit (INDEPENDENT_AMBULATORY_CARE_PROVIDER_SITE_OTHER): Payer: BLUE CROSS/BLUE SHIELD | Admitting: Adult Health

## 2017-12-09 ENCOUNTER — Encounter: Payer: Self-pay | Admitting: Adult Health

## 2017-12-09 VITALS — BP 126/80 | Temp 97.6°F | Wt 191.0 lb

## 2017-12-09 DIAGNOSIS — R251 Tremor, unspecified: Secondary | ICD-10-CM

## 2017-12-09 DIAGNOSIS — R258 Other abnormal involuntary movements: Secondary | ICD-10-CM | POA: Diagnosis not present

## 2017-12-09 NOTE — Progress Notes (Signed)
Subjective:    Patient ID: April Gonzalez, female    DOB: January 08, 1961, 57 y.o.   MRN: [id number]  HPI  57 year old female who  has a past medical history of Allergy, Anemia (05/2012), Fibroids, GERD (gastroesophageal reflux disease), H/O uterine prolapse, History of blood transfusion, Hyperlipidemia, Hypertension, Rectal bleeding, Seizures ([place]), and Thyroid disorder.  She presents to the office today for the complaint of " lip twitching".  This is been an intermittent issue for over the last year.  She reports that yesterday she had both upper and lower lip twitching for most of the day, today symptoms have resolved.  She denies any other symptoms such as blurred vision, headaches, and normal facial sensation, slurred speech or facial droop.    She does report that over the weekend she has been able to get much sleep due to her son coming in from out of town.  She denies any caffeine use or OTC supplement use.  Review of Systems See HPI   Past Medical History:  Diagnosis Date  . Allergy   . Anemia 05/2012   transfusion  . Fibroids    H/O myomectomy  . GERD (gastroesophageal reflux disease)    rare   . H/O uterine prolapse   . History of blood transfusion    x 3   . Hyperlipidemia   . Hypertension   . Rectal bleeding    NOTED AT pv 01-09-17- PT STATES with most BM's for the last 2 years, also hurts   . Seizures ([place])    last seizure age 56 or 10 per pt report-01-09-17 no change in this  . Thyroid disorder     Social History   Socioeconomic History  . Marital status: Married    Spouse name: Not on file  . Number of children: 2  . Years of education: 60  . Highest education level: Not on file  Occupational History  . Not on file  Social Needs  . Financial resource strain: Not on file  . Food insecurity:    Worry: Not on file    Inability: Not on file  . Transportation needs:    Medical: Not on file    Non-medical: Not on file  Tobacco Use  . Smoking status: Never Smoker    . Smokeless tobacco: Never Used  Substance and Sexual Activity  . Alcohol use: Yes    Alcohol/week: 0.0 oz    Comment: rare  . Drug use: No  . Sexual activity: Yes    Birth control/protection: Surgical    Comment: BTL   Lifestyle  . Physical activity:    Days per week: Not on file    Minutes per session: Not on file  . Stress: Not on file  Relationships  . Social connections:    Talks on phone: Not on file    Gets together: Not on file    Attends religious service: Not on file    Active member of club or organization: Not on file    Attends meetings of clubs or organizations: Not on file    Relationship status: Not on file  . Intimate partner violence:    Fear of current or ex partner: Not on file    Emotionally abused: Not on file    Physically abused: Not on file    Forced sexual activity: Not on file  Other Topics Concern  . Not on file  Social History Narrative   She works for [employer]  Married   Has 2 children     Past Surgical History:  Procedure Laterality Date  . ANKLE SURGERY     One plate, seven screws in the right ankle   . BLADDER SUSPENSION N/A 12/10/2012   Procedure: TRANSVAGINAL TAPE (TVT) PROCEDURE;  Surgeon: Delice Lesch, MD;  Location: [facility];  Service: Gynecology;  Laterality: N/A;  . CYSTOCELE REPAIR N/A 12/10/2012   Procedure: ANTERIOR REPAIR (CYSTOCELE);  Surgeon: Eldred Manges, MD;  Location: [facility];  Service: Gynecology;  Laterality: N/A;  . CYSTOSCOPY N/A 12/10/2012   Procedure: CYSTOSCOPY;  Surgeon: Delice Lesch, MD;  Location: [facility];  Service: Gynecology;  Laterality: N/A;  . EXCISION VAGINAL CYST    . MYOMECTOMY  24YRS AGO   HYSTEROSCOPIC  . TUBAL LIGATION  10/10/1991  . VAGINAL HYSTERECTOMY N/A 12/10/2012   Procedure: Total Vaginal Hysterectomy;  Surgeon: Eldred Manges, MD;  Location: [facility];  Service: Gynecology;  Laterality: N/A;    Family History  Problem Relation Age of Onset  . Heart failure Father   . Diabetes  Mother   . High blood pressure Mother   . Arthritis Mother   . Breast cancer Maternal Aunt        in her 10's   . Breast cancer Cousin   . Thyroid disease Sister   . Colon cancer Neg Hx   . Colon polyps Neg Hx   . Esophageal cancer Neg Hx   . Rectal cancer Neg Hx   . Stomach cancer Neg Hx     Allergies  Allergen Reactions  . Methimazole Anaphylaxis    EMS had to be called and a 7-day's stay in the hospital resulted  . Nitroglycerin Other (See Comments)    Sweating   . Other Itching and Other (See Comments)    Seasonal allergies & dog hair = runny nose also  . Tetracycline Hcl Hypertension  . Promethazine Nausea Only    Made the patient feel like she was going to vomit and it "spaced" her out  . Shrimp [Shellfish Allergy] Itching  . Latex Rash  . Penicillins Other (See Comments)    Has patient had a PCN reaction causing immediate rash, facial/tongue/throat swelling, SOB or lightheadedness with hypotension: Unk Has patient had a PCN reaction causing severe rash involving mucus membranes or skin necrosis: Unk Has patient had a PCN reaction that required hospitalization: Unk Has patient had a PCN reaction occurring within the last 10 years: Yes If all of the above answers are "NO", then may proceed with Cephalosporin use.   . Tape Rash  . Terbutaline Other (See Comments)    Reaction not recalled (??)    Current Outpatient Medications on File Prior to Visit  Medication Sig Dispense Refill  . EPINEPHrine (EPIPEN 2-PAK) 0.3 mg/0.3 mL IJ SOAJ injection Inject 0.3 mLs (0.3 mg total) into the muscle once. If you develop difficulty breathing 2 Device 0  . halobetasol (ULTRAVATE) 0.05 % ointment Apply topically 2 (two) times daily. 50 g 1  . ibuprofen (ADVIL,MOTRIN) 200 MG tablet Take 200-800 mg by mouth daily as needed for moderate pain.      No current facility-administered medications on file prior to visit.     BP 126/80   Temp 97.6 F (36.4 C) (Oral)   Wt 191 lb (86.6 kg)    LMP 12/03/2012   BMI 32.79 kg/m       Objective:   Physical Exam  Constitutional: She is oriented to person,  place, and time. She appears well-developed and well-nourished. No distress.  HENT:  Head: Normocephalic and atraumatic.  Right Ear: External ear normal.  Left Ear: External ear normal.  Nose: Nose normal.  Mouth/Throat: Oropharynx is clear and moist. No oropharyngeal exudate.  Eyes: Pupils are equal, round, and reactive to light. Conjunctivae and EOM are normal. Right eye exhibits no discharge. Left eye exhibits no discharge.  Cardiovascular: Normal rate and regular rhythm.  Pulmonary/Chest: Effort normal and breath sounds normal.  Neurological: She is alert and oriented to person, place, and time. She has normal strength. She displays no atrophy, no tremor and normal reflexes. No cranial nerve deficit or sensory deficit. She exhibits normal muscle tone. She displays a negative Romberg sign. She displays no seizure activity. Coordination and gait normal. GCS eye subscore is 4. GCS verbal subscore is 5. GCS motor subscore is 6.  No facial tremors noted on exam  Normal sensation to bilateral sides of face.  No facial droop, slurred speech, or ataxia noted  Skin: Skin is warm and dry. Capillary refill takes less than 2 seconds. No rash noted. She is not diaphoretic. No erythema. No pallor.  Psychiatric: She has a normal mood and affect. Her behavior is normal. Judgment and thought content normal.  Nursing note and vitals reviewed.     Assessment & Plan:  1. Lip tremor -Discussed various etiologies such as stress, lack of sleep, supplement use, and potassium deficiency.  Her potassium levels have been normal, some not concerned about hypokalemia.  More than likely is due to stress and lack of sleep.  She will follow-up if symptoms persist  [name], NP

## 2018-01-16 ENCOUNTER — Ambulatory Visit: Payer: Self-pay | Admitting: Adult Health

## 2018-01-16 NOTE — Telephone Encounter (Signed)
Called in c/o having a gray color on her right ring finger at the cuticle.   Denies any pain there.   Just wants it checked. She then went on to tell me her left leg has been feeling weak and tingling for a month.   It's hard to stand sometimes because it's weak.    See triage notes.  I have scheduled her for the Saturday Clinic at the [facility] office with Dr. Frederico Hamman Copland at 9:15 on 01/17/18.  I let her know to go to the ED if her symptoms became worse or her left arm also gets tingling and weak.     Reason for Disposition . [1] Weakness of arm / hand, or leg / foot AND [2] is a chronic symptom (recurrent or ongoing AND present > 4 weeks)  Answer Assessment - Initial Assessment Questions 1. SYMPTOM: "What is the main symptom you are concerned about?" (e.g., weakness, numbness)     I have grayness in my cuticle on my right hand ring finger.  No pain,  I'm having tinkling in my left leg.      I feel weak.   My left leg I can't hardly stand on.  It's so weak for about a month.     2. ONSET: "When did this start?" (minutes, hours, days; while sleeping)     A month.   3. LAST NORMAL: "When was the last time you were normal (no symptoms)?"     I fell about 4 months ago chasing the dog.   I fell backwards.   Every once in a while I just get a bad tingling in my back that is brief. 4. PATTERN "Does this come and go, or has it been constant since it started?"  "Is it present now?"     Just once in a while. 5. CARDIAC SYMPTOMS: "Have you had any of the following symptoms: chest pain, difficulty breathing, palpitations?"     My chest was hurting a couple of days ago.  I think it was because I ate too much.   I've had the chest pain before.    No cardiac history.     The chest pain has been off and on.   It's been happening for years. 6. NEUROLOGIC SYMPTOMS: "Have you had any of the following symptoms: headache, dizziness, vision loss, double vision, changes in speech, unsteady on your feet?"     My  glasses are broken.   I get a headache  That is a pain like a piercing pain.   I've had it looked at before.    7. OTHER SYMPTOMS: "Do you have any other symptoms?"     Just my left leg tingling and weak for a month.   Not swollen.   8. PREGNANCY: "Is there any chance you are pregnant?" "When was your last menstrual period?"     Hysterectomy partial  Protocols used: NEUROLOGIC DEFICIT-A-AH

## 2018-01-17 ENCOUNTER — Encounter: Payer: Self-pay | Admitting: Family Medicine

## 2018-01-17 ENCOUNTER — Ambulatory Visit (INDEPENDENT_AMBULATORY_CARE_PROVIDER_SITE_OTHER): Admitting: Family Medicine

## 2018-01-17 VITALS — BP 126/84 | HR 74 | Temp 98.3°F | Wt 190.0 lb

## 2018-01-17 DIAGNOSIS — M5416 Radiculopathy, lumbar region: Secondary | ICD-10-CM | POA: Diagnosis not present

## 2018-01-17 MED ORDER — HALOBETASOL PROPIONATE 0.05 % EX OINT: TOPICAL_OINTMENT | Freq: Two times a day (BID) | CUTANEOUS | 1 refills | Status: DC

## 2018-01-17 MED ORDER — AMITRIPTYLINE HCL 25 MG PO TABS
25.0000 mg | ORAL_TABLET | Freq: Every day | ORAL | 1 refills | Status: DC
Start: 2018-01-17 — End: 2019-04-22

## 2018-01-17 MED ORDER — PREDNISONE 20 MG PO TABS: ORAL_TABLET | ORAL | 0 refills | Status: DC

## 2018-01-17 NOTE — Progress Notes (Signed)
[facility]Call [facility] [place], [name] Phone: [phone]  Patient ID: April Gonzalez MRN: [id number], DOB: 10-05-1960, 57 y.o. Date of Encounter: 01/17/2018   Primary Physician:  [name], NP   Chief Complaint  Patient presents with  . Tingling    left leg x 1 month... difficulty standing and some weakness.... pens and needles.... pt reports she did fall x 3 months ago on her back outside x 2 months ago she fell while trying to put a picture...pt has tried OTC Tylenol...   Subjective:   April Gonzalez is a 56 y.o. very pleasant female patient who presents with the following: Back Pain  ongoing for approximately: 2 mo The patient has had back pain before. The back pain is localized into the lumbar spine area. They also describe L radiculopathy.  [place] in May or June and she tried to catch him an dreached for him and her foot got caught between the grass and the sidewalk. Hurt for a long time. A month later, was putting some curtain rods and fell back and hit her head on the wall.   L leg - does not matter if sitting or standing, sensation of tingling down the whole leg and weak, only on the L leg. Pain in her back in the lowest part of her low back. Getting some popping.   L lateral leg Lateral foot Toe space  No bowel or bladder incontinence. No focal weakness. Prior interventions: minimal Physical therapy: No Chiropractic manipulations: No Acupuncture: No Osteopathic manipulation: No Heat or cold: Minimal effect  Past Medical History, Surgical History, Family History, Medications, Allergies have been reviewed and updated if relevant.  Patient Active Problem List   Diagnosis Date Noted  . Internal hemorrhoids 02/28/2017  . Irritable bowel syndrome 02/28/2017  . Skin rash 11/30/2016  . Acute non-recurrent maxillary sinusitis 11/09/2016  . Chronic daily headache 07/03/2016  . Osteoarthritis 10/20/2015  .  Neck swelling 10/20/2015  . Hypothyroidism 06/28/2015  . Essential hypertension 06/28/2015  . Faintness 04/28/2015  . Gustatory hallucinations 04/28/2015  . Hyperlipidemia 01/17/2015  . Goiter 01/06/2015  . Low vitamin B12 level 01/06/2015  . Diffuse abdominal pain 07/14/2014  . Hematochezia 07/14/2014  . Rectal bleeding 06/21/2014  . Numbness and tingling of foot 06/21/2014  . Dense breasts 07/13/2012  . Seizures ([place])   . Fibroids   . Iron deficiency anemia 01/06/2007    Past Medical History:  Diagnosis Date  . Allergy   . Anemia 05/2012   transfusion  . Fibroids    H/O myomectomy  . GERD (gastroesophageal reflux disease)    rare   . H/O uterine prolapse   . History of blood transfusion    x 3   . Hyperlipidemia   . Hypertension   . Rectal bleeding    NOTED AT pv 01-09-17- PT STATES with most BM's for the last 2 years, also hurts   . Seizures ([place])    last seizure age 63 or 10 per pt report-01-09-17 no change in this  . Thyroid disorder     Past Surgical History:  Procedure Laterality Date  . ANKLE SURGERY     One plate, seven screws in the right ankle   . BLADDER SUSPENSION N/A 12/10/2012   Procedure: TRANSVAGINAL TAPE (TVT) PROCEDURE;  Surgeon: Delice Lesch, MD;  Location: [facility];  Service: Gynecology;  Laterality: N/A;  . CYSTOCELE REPAIR N/A 12/10/2012   Procedure: ANTERIOR REPAIR (CYSTOCELE);  Surgeon:  Eldred Manges, MD;  Location: [facility];  Service: Gynecology;  Laterality: N/A;  . CYSTOSCOPY N/A 12/10/2012   Procedure: CYSTOSCOPY;  Surgeon: Delice Lesch, MD;  Location: [facility];  Service: Gynecology;  Laterality: N/A;  . EXCISION VAGINAL CYST    . MYOMECTOMY  69YRS AGO   HYSTEROSCOPIC  . TUBAL LIGATION  10/10/1991  . VAGINAL HYSTERECTOMY N/A 12/10/2012   Procedure: Total Vaginal Hysterectomy;  Surgeon: Eldred Manges, MD;  Location: [facility];  Service: Gynecology;  Laterality: N/A;    Social History   Socioeconomic History  . Marital status: Married     Spouse name: Not on file  . Number of children: 2  . Years of education: 38  . Highest education level: Not on file  Occupational History  . Not on file  Social Needs  . Financial resource strain: Not on file  . Food insecurity:    Worry: Not on file    Inability: Not on file  . Transportation needs:    Medical: Not on file    Non-medical: Not on file  Tobacco Use  . Smoking status: Never Smoker  . Smokeless tobacco: Never Used  Substance and Sexual Activity  . Alcohol use: Yes    Alcohol/week: 0.0 standard drinks    Comment: rare  . Drug use: No  . Sexual activity: Yes    Birth control/protection: Surgical    Comment: BTL   Lifestyle  . Physical activity:    Days per week: Not on file    Minutes per session: Not on file  . Stress: Not on file  Relationships  . Social connections:    Talks on phone: Not on file    Gets together: Not on file    Attends religious service: Not on file    Active member of club or organization: Not on file    Attends meetings of clubs or organizations: Not on file    Relationship status: Not on file  . Intimate partner violence:    Fear of current or ex partner: Not on file    Emotionally abused: Not on file    Physically abused: Not on file    Forced sexual activity: Not on file  Other Topics Concern  . Not on file  Social History Narrative   She works for [employer]    Married   Has 2 children     Family History  Problem Relation Age of Onset  . Heart failure Father   . Diabetes Mother   . High blood pressure Mother   . Arthritis Mother   . Breast cancer Maternal Aunt        in her 21's   . Breast cancer Cousin   . Thyroid disease Sister   . Colon cancer Neg Hx   . Colon polyps Neg Hx   . Esophageal cancer Neg Hx   . Rectal cancer Neg Hx   . Stomach cancer Neg Hx     Allergies  Allergen Reactions  . Methimazole Anaphylaxis    EMS had to be called and a 7-day's stay in the hospital resulted  . Nitroglycerin Other  (See Comments)    Sweating   . Other Itching and Other (See Comments)    Seasonal allergies & dog hair = runny nose also  . Tetracycline Hcl Hypertension  . Promethazine Nausea Only    Made the patient feel like she was going to vomit and it "spaced" her out  . Shrimp [  Shellfish Allergy] Itching  . Latex Rash  . Penicillins Other (See Comments)    Has patient had a PCN reaction causing immediate rash, facial/tongue/throat swelling, SOB or lightheadedness with hypotension: Unk Has patient had a PCN reaction causing severe rash involving mucus membranes or skin necrosis: Unk Has patient had a PCN reaction that required hospitalization: Unk Has patient had a PCN reaction occurring within the last 10 years: Yes If all of the above answers are "NO", then may proceed with Cephalosporin use.   . Tape Rash  . Terbutaline Other (See Comments)    Reaction not recalled (??)    Medication list reviewed and updated in full in [place].  GEN: No fevers, chills. Nontoxic. Primarily MSK c/o today. MSK: Detailed in the HPI GI: tolerating PO intake without difficulty Neuro: As above  Otherwise the pertinent positives of the ROS are noted above.    Objective:   Blood pressure 126/84, pulse 74, temperature 98.3 F (36.8 C), temperature source Oral, weight 190 lb (86.2 kg), last menstrual period 12/03/2012, SpO2 97 %.  Gen: Well-developed,well-nourished,in no acute distress; alert,appropriate and cooperative throughout examination HEENT: Normocephalic and atraumatic without obvious abnormalities.  Ears, externally no deformities Pulm: Breathing comfortably in no respiratory distress Range of motion at  the waist: Flexion, rotation and lateral bending: full  No echymosis or edema Rises to examination table with no difficulty Gait: minimally antalgic  Inspection/Deformity: No abnormality Paraspinus T:  l5-s1 b ttp  B Ankle Dorsiflexion (L5,4): 5/5 B Great Toe Dorsiflexion (L5,4):  5/5 Heel Walk (L5): WNL Toe Walk (S1): WNL Rise/Squat (L4): WNL, mild pain  SENSORY B Medial Foot (L4): WNL B Dorsum (L5): WNL B Lateral (S1): decreased to light and pinprick  REFLEXES Knee (L4): 2+ Ankle (S1): 2+  B SLR, seated: neg B SLR, supine: neg B FABER: neg B Reverse FABER: neg B Greater Troch: NT B Log Roll: neg B Stork: NT B Sciatic Notch: NT  Radiology: No results found.  Assessment and Plan:   Left lumbar radiculopathy  Classic radiculopathy with L lateral leg and foot decreased sensation and tingling.   10 d of steroids with Elavil 25 mg for neuropathic pain and sx + sleep  Follow-up: PCP in 3 weeks if no improvement.  Meds ordered this encounter  Medications  . halobetasol (ULTRAVATE) 0.05 % ointment    Sig: Apply topically 2 (two) times daily.    Dispense:  50 g    Refill:  1  . predniSONE (DELTASONE) 20 MG tablet    Sig: 2 tabs po for 5 days, then 1 tab po for 5 days    Dispense:  15 tablet    Refill:  0  . amitriptyline (ELAVIL) 25 MG tablet    Sig: Take 1 tablet (25 mg total) by mouth at bedtime.    Dispense:  30 tablet    Refill:  1   No orders of the defined types were placed in this encounter.   Signed,  Maud Deed. Jourdon Zimmerle, MD   Allergies as of 01/17/2018      Reactions   Methimazole Anaphylaxis   EMS had to be called and a 7-day's stay in the hospital resulted   Nitroglycerin Other (See Comments)   Sweating   Other Itching, Other (See Comments)   Seasonal allergies & dog hair = runny nose also   Tetracycline Hcl Hypertension   Promethazine Nausea Only   Made the patient feel like she was going to vomit and it "  spaced" her out   Shrimp [shellfish Allergy] Itching   Latex Rash   Penicillins Other (See Comments)   Has patient had a PCN reaction causing immediate rash, facial/tongue/throat swelling, SOB or lightheadedness with hypotension: Unk Has patient had a PCN reaction causing severe rash involving mucus membranes or skin  necrosis: Unk Has patient had a PCN reaction that required hospitalization: Unk Has patient had a PCN reaction occurring within the last 10 years: Yes If all of the above answers are "NO", then may proceed with Cephalosporin use.   Tape Rash   Terbutaline Other (See Comments)   Reaction not recalled (??)      Medication List        Accurate as of 01/17/18  9:44 AM. Always use your most recent med list.          amitriptyline 25 MG tablet Commonly known as:  ELAVIL Take 1 tablet (25 mg total) by mouth at bedtime.   EPINEPHrine 0.3 mg/0.3 mL Soaj injection Commonly known as:  EPI-PEN Inject 0.3 mLs (0.3 mg total) into the muscle once. If you develop difficulty breathing   halobetasol 0.05 % ointment Commonly known as:  ULTRAVATE Apply topically 2 (two) times daily.   ibuprofen 200 MG tablet Commonly known as:  ADVIL,MOTRIN Take 200-800 mg by mouth daily as needed for moderate pain.   predniSONE 20 MG tablet Commonly known as:  DELTASONE 2 tabs po for 5 days, then 1 tab po for 5 days

## 2018-02-26 ENCOUNTER — Ambulatory Visit: Payer: Self-pay

## 2018-02-26 NOTE — Telephone Encounter (Signed)
Patient called in with c/o "neck pain." She says "the pain started about 4-5 days ago starting at the lower part of her shoulder blades into the neck, now it has gone up into the back of the neck up to her ears and jaw line. It's hard to swallow and the pain is a 8. I've taken Tylenol, which didn't help, and Motrin, which numbs the pain, but it comes right back. I've tried heat and as soon as I remove the heat, the pain comes back." I asked about injury to the neck, she denies. I asked about neck swelling, difficulty breathing, chest pain, headache, fever-she denies. According to protocol, see PCP within 4 hours. I advised the patient to go to [facility] or ED. She asked about an appointment tomorrow, I advised no availability after 4 pm, when she gets off work. I advised if she didn't go to the UC tonight, to call back in the morning before work to see if there were any cancellations and if not, someone from the office will call if she is able to be worked in, she verbalized understanding. However, I stressed to go to [facility] tonight as the first option.   Reason for Disposition . [1] SEVERE neck pain (e.g., excruciating, unable to do any normal activities) AND [2] not improved after 2 hours of pain medicine  Answer Assessment - Initial Assessment Questions 1. ONSET: "When did the pain begin?"      4-5 days ago 2. LOCATION: "Where does it hurt?"      Back of neck; started at the lower part of shoulder blades into the neck 3. PATTERN "Does the pain come and go, or has it been constant since it started?"      Constant 4. SEVERITY: "How bad is the pain?"  (Scale 1-10; or mild, moderate, severe)   - MILD (1-3): doesn't interfere with normal activities    - MODERATE (4-7): interferes with normal activities or awakens from sleep    - SEVERE (8-10):  excruciating pain, unable to do any normal activities      8 5. RADIATION: "Does the pain go anywhere else, shoot into your arms?"     Up into ears and jaw  6. CORD  SYMPTOMS: "Any weakness or numbness of the arms or legs?"     No 7. CAUSE: "What do you think is causing the neck pain?"     I don't know 8. NECK OVERUSE: "Any recent activities that involved turning or twisting the neck?"     No 9. OTHER SYMPTOMS: "Do you have any other symptoms?" (e.g., headache, fever, chest pain, difficulty breathing, neck swelling)     Hard to swallow 10. PREGNANCY: "Is there any chance you are pregnant?" "When was your last menstrual period?"       No  Protocols used: NECK PAIN OR STIFFNESS-A-AH

## 2018-02-27 NOTE — Telephone Encounter (Signed)
FYI

## 2018-06-18 ENCOUNTER — Ambulatory Visit: Payer: BLUE CROSS/BLUE SHIELD | Admitting: Internal Medicine

## 2019-01-02 ENCOUNTER — Emergency Department

## 2019-01-02 ENCOUNTER — Other Ambulatory Visit: Payer: Self-pay

## 2019-01-02 ENCOUNTER — Emergency Department
Admission: EM | Admit: 2019-01-02 | Discharge: 2019-01-02 | Disposition: A | Discharge status: Home / Self Care | Attending: Emergency Medicine | Admitting: Emergency Medicine

## 2019-01-02 ENCOUNTER — Encounter: Payer: Self-pay | Admitting: Emergency Medicine

## 2019-01-02 ENCOUNTER — Encounter (HOSPITAL_COMMUNITY): Payer: Self-pay

## 2019-01-02 ENCOUNTER — Emergency Department (HOSPITAL_COMMUNITY)

## 2019-01-02 ENCOUNTER — Emergency Department (HOSPITAL_COMMUNITY)
Admission: EM | Admit: 2019-01-02 | Discharge: 2019-01-02 | Disposition: A | Discharge status: Home / Self Care | Attending: Emergency Medicine | Admitting: Emergency Medicine

## 2019-01-02 DIAGNOSIS — M25552 Pain in left hip: Secondary | ICD-10-CM

## 2019-01-02 DIAGNOSIS — E039 Hypothyroidism, unspecified: Secondary | ICD-10-CM | POA: Insufficient documentation

## 2019-01-02 DIAGNOSIS — M25571 Pain in right ankle and joints of right foot: Secondary | ICD-10-CM

## 2019-01-02 DIAGNOSIS — S82891A Other fracture of right lower leg, initial encounter for closed fracture: Secondary | ICD-10-CM | POA: Diagnosis not present

## 2019-01-02 DIAGNOSIS — Y92410 Unspecified street and highway as the place of occurrence of the external cause: Secondary | ICD-10-CM | POA: Insufficient documentation

## 2019-01-02 DIAGNOSIS — I10 Essential (primary) hypertension: Secondary | ICD-10-CM | POA: Diagnosis not present

## 2019-01-02 DIAGNOSIS — Z5321 Procedure and treatment not carried out due to patient leaving prior to being seen by health care provider: Secondary | ICD-10-CM | POA: Diagnosis not present

## 2019-01-02 DIAGNOSIS — Y999 Unspecified external cause status: Secondary | ICD-10-CM | POA: Diagnosis not present

## 2019-01-02 DIAGNOSIS — Z79899 Other long term (current) drug therapy: Secondary | ICD-10-CM | POA: Insufficient documentation

## 2019-01-02 DIAGNOSIS — R519 Headache, unspecified: Secondary | ICD-10-CM

## 2019-01-02 DIAGNOSIS — Y9389 Activity, other specified: Secondary | ICD-10-CM | POA: Insufficient documentation

## 2019-01-02 DIAGNOSIS — Z91040 Latex allergy status: Secondary | ICD-10-CM | POA: Diagnosis not present

## 2019-01-02 DIAGNOSIS — R51 Headache: Secondary | ICD-10-CM | POA: Insufficient documentation

## 2019-01-02 DIAGNOSIS — S99911A Unspecified injury of right ankle, initial encounter: Secondary | ICD-10-CM | POA: Diagnosis present

## 2019-01-02 DIAGNOSIS — R0789 Other chest pain: Secondary | ICD-10-CM | POA: Insufficient documentation

## 2019-01-02 MED ORDER — DIPHENHYDRAMINE HCL 25 MG PO CAPS
50.0000 mg | ORAL_CAPSULE | Freq: Once | ORAL | Status: AC
  Administered 2019-01-02: 50 mg via ORAL
  Filled 2019-01-02: qty 2

## 2019-01-02 MED ORDER — IBUPROFEN 400 MG PO TABS
600.0000 mg | ORAL_TABLET | Freq: Once | ORAL | Status: AC
  Administered 2019-01-02: 600 mg via ORAL
  Filled 2019-01-02: qty 1

## 2019-01-02 MED ORDER — PROCHLORPERAZINE MALEATE 5 MG PO TABS
10.0000 mg | ORAL_TABLET | Freq: Once | ORAL | Status: AC
  Administered 2019-01-02: 10:00:00 10 mg via ORAL
  Filled 2019-01-02: qty 2
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
## 2019-01-02 NOTE — ED Notes (Signed)
Pt up to desk to ask about wait time. Pt states that her Dr has hours on Saturday and that she will just go there in the morning. Pt ambulatory out of ED with steady gait and in NAD.

## 2019-01-02 NOTE — Discharge Instructions (Signed)
Your x-ray last night shows evidence of a small fracture to your fibula near your prior ankle fracture.  As you are still able to bear weight, please use the cam walker to help support the ankle and follow-up with your previous orthopedics team.  Your other images were reassuring.  The CT head from last night showed no fracture or intracranial abnormality.  We suspect he has a postconcussive headache.  Please stay hydrated and rest.  If any symptoms change or worsen, please return to the nearest emergency department.

## 2019-01-02 NOTE — ED Notes (Signed)
Pt restful lat this time without acute outward distress.  C/o continued HA with resolve of nausea and able to eat some lunch and take fluids.  Will check with MD for nest steps.  Pt updated on POC.  Reviewed BP at this time and will continue to monitor.

## 2019-01-02 NOTE — Progress Notes (Signed)
Orthopedic Tech Progress Note Patient Details:  April Gonzalez 23-Jun-1960 [id number]  Ortho Devices Type of Ortho Device: CAM walker Ortho Device/Splint Location: LRE Ortho Device/Splint Interventions: Ordered, Application, Adjustment   Post Interventions Patient Tolerated: Well Instructions Provided: Care of device, Adjustment of device   [name] 01/02/2019, 12:22 PM
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
## 2019-01-02 NOTE — ED Notes (Signed)
Patient transported to X-ray 
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
## 2019-01-05 ENCOUNTER — Telehealth (INDEPENDENT_AMBULATORY_CARE_PROVIDER_SITE_OTHER): Admitting: Adult Health

## 2019-01-05 ENCOUNTER — Other Ambulatory Visit: Payer: Self-pay

## 2019-01-05 DIAGNOSIS — M25552 Pain in left hip: Secondary | ICD-10-CM | POA: Diagnosis not present

## 2019-01-05 DIAGNOSIS — R0789 Other chest pain: Secondary | ICD-10-CM

## 2019-01-05 DIAGNOSIS — G44321 Chronic post-traumatic headache, intractable: Secondary | ICD-10-CM

## 2019-01-05 MED ORDER — CYCLOBENZAPRINE HCL 10 MG PO TABS: 10.0000 mg | ORAL_TABLET | Freq: Every day | ORAL | 0 refills | Status: DC
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
## 2019-01-15 ENCOUNTER — Telehealth (INDEPENDENT_AMBULATORY_CARE_PROVIDER_SITE_OTHER): Admitting: Adult Health

## 2019-01-15 ENCOUNTER — Other Ambulatory Visit: Payer: Self-pay

## 2019-01-15 DIAGNOSIS — G44319 Acute post-traumatic headache, not intractable: Secondary | ICD-10-CM

## 2019-01-15 MED ORDER — METHYLPREDNISOLONE 4 MG PO TBPK: ORAL_TABLET | ORAL | 0 refills | Status: DC
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
## 2019-01-18 ENCOUNTER — Other Ambulatory Visit: Payer: Self-pay

## 2019-01-18 DIAGNOSIS — Z20822 Contact with and (suspected) exposure to covid-19: Secondary | ICD-10-CM

## 2019-01-19 ENCOUNTER — Telehealth: Payer: Self-pay | Admitting: Adult Health

## 2019-01-19 LAB — NOVEL CORONAVIRUS, NAA: SARS-CoV-2, NAA: NOT DETECTED

## 2019-01-19 NOTE — Telephone Encounter (Signed)
Negative COVID results given. Patient results "NOT Detected." Caller expressed understanding. ° °

## 2019-01-20 ENCOUNTER — Ambulatory Visit (INDEPENDENT_AMBULATORY_CARE_PROVIDER_SITE_OTHER): Admitting: Family Medicine

## 2019-01-20 ENCOUNTER — Encounter: Payer: Self-pay | Admitting: Family Medicine

## 2019-01-20 ENCOUNTER — Telehealth: Payer: Self-pay

## 2019-01-20 DIAGNOSIS — S0990XA Unspecified injury of head, initial encounter: Secondary | ICD-10-CM | POA: Diagnosis not present
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
## 2019-01-22 ENCOUNTER — Telehealth: Payer: Self-pay

## 2019-01-22 NOTE — Telephone Encounter (Signed)
Copied from [place] [phone]. Topic: General - Other >> Jan 22, 2019  8:39 AM Celene Kras A wrote: Reason for CRM: Pt called stating she could not find choline anywhere. Pt is requesting to have an alternative. Pt is requesting a call back today as she was supposed to start the medication yesterday.

## 2019-01-26 ENCOUNTER — Other Ambulatory Visit: Payer: Self-pay | Admitting: Adult Health

## 2019-01-26 ENCOUNTER — Telehealth: Payer: Self-pay | Admitting: Family Medicine

## 2019-01-26 MED ORDER — FISH OIL 1000 MG PO CAPS: 2000.0000 mg | ORAL_CAPSULE | Freq: Every day | ORAL | 1 refills | Status: AC

## 2019-01-26 NOTE — Telephone Encounter (Signed)
Copied from [place] [id number]. Topic: General - Other >> Jan 25, 2019 11:39 AM Antonieta Iba C wrote: Reason for CRM: pt called in requesting a Rx for fish oil - 2 gram daily. Pt says that it was suggested by Dr. Tamala Julian but pt cant find OTC and was advised by pharmacy to request a Rx from PCP.  Pharmacy: [facility] [address].   CB: [phone]

## 2019-01-26 NOTE — Telephone Encounter (Signed)
Sent to pharmacy 

## 2019-01-28 ENCOUNTER — Ambulatory Visit (INDEPENDENT_AMBULATORY_CARE_PROVIDER_SITE_OTHER): Admitting: Family Medicine

## 2019-01-28 ENCOUNTER — Encounter: Payer: Self-pay | Admitting: Family Medicine

## 2019-01-28 ENCOUNTER — Other Ambulatory Visit: Payer: Self-pay

## 2019-01-28 DIAGNOSIS — R519 Headache, unspecified: Secondary | ICD-10-CM

## 2019-01-28 DIAGNOSIS — R51 Headache: Secondary | ICD-10-CM

## 2019-01-28 DIAGNOSIS — S0990XA Unspecified injury of head, initial encounter: Secondary | ICD-10-CM
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
## 2019-02-13 ENCOUNTER — Other Ambulatory Visit: Payer: Self-pay

## 2019-02-13 ENCOUNTER — Ambulatory Visit (INDEPENDENT_AMBULATORY_CARE_PROVIDER_SITE_OTHER)

## 2019-02-13 DIAGNOSIS — Z23 Encounter for immunization: Secondary | ICD-10-CM | POA: Diagnosis not present

## 2019-02-24 NOTE — Telephone Encounter (Signed)
error 

## 2019-03-12 ENCOUNTER — Ambulatory Visit

## 2019-03-17 ENCOUNTER — Telehealth: Payer: Self-pay | Admitting: Adult Health

## 2019-03-17 NOTE — Telephone Encounter (Signed)
Please disregard previous message ,patient is requesting medication not on current med list.

## 2019-03-17 NOTE — Telephone Encounter (Signed)
Patient is requesting medication on current med list , see encounter below.

## 2019-03-17 NOTE — Telephone Encounter (Signed)
Copied from [place] [id number]. Topic: Quick Communication - Rx Refill/Question >> Mar 17, 2019  4:03 PM Leward Quan A wrote: Medication: ferrous sulfate tablet 325 mg    Has the patient contacted their pharmacy? Yes.   (Agent: If no, request that the patient contact the pharmacy for the refill.) (Agent: If yes, when and what did the pharmacy advise?)  Preferred Pharmacy (with phone number or street name): [facility] [address], [place] - [address] [phone] (Phone) [phone] (Fax)    Agent: Please be advised that RX refills may take up to 3 business days. We ask that you follow-up with your pharmacy.

## 2019-03-18 NOTE — Telephone Encounter (Signed)
Iron is OTC. Her labs are normal and they have been. She does not necessarily need to take this medication

## 2019-03-18 NOTE — Telephone Encounter (Signed)
Tried returning the call to the pt.  Received a message that the mailbox is full and cannot accept messages.  Will try again at a later time.

## 2019-03-19 NOTE — Telephone Encounter (Signed)
Pt returned missed call from [facility]. No answer on Fc line. Please return call. Advised that her vm box was full so a message could not be left. Pt stated she would clear it out.

## 2019-03-23 NOTE — Telephone Encounter (Signed)
Called and spoke to the pt.  Informed her of Cory's message.  She will not restart iron at this time.  Will close note.

## 2019-04-21 ENCOUNTER — Ambulatory Visit: Payer: Self-pay | Admitting: Adult Health

## 2019-04-21 NOTE — Telephone Encounter (Signed)
   Answer Assessment - Initial Assessment Questions 1. ONSET: "When did the pain start?" (e.g., minutes, hours, days)     3 days ago 2. ONSET: "Does the pain come and go, or has it been constant since it started?" (e.g., constant, intermittent, fleeting)     constant 3. SEVERITY: "How bad is the pain?"   (Scale 1-10; mild, moderate or severe)   - MILD (1-3): doesn't interfere with normal activities    - MODERATE (4-7): interferes with normal activities or awakens from sleep    - SEVERE (8-10): excruciating pain, unable to do any normal activities      7/10 4. LOCATION: "Where does it hurt?"      Across forehead, bridge of nose, ear ache at times 5. RASH: "Is there any redness, rash, or swelling of the face?"     no 6. FEVER: "Do you have a fever?" If so, ask: "What is it, how was it measured, and when did it start?"     chills 7. OTHER SYMPTOMS: "Do you have any other symptoms?" (e.g., fever, toothache, nasal discharge, nasal congestion, clicking sensation in jaw joint)     No, Weak from "food poisoning."  Protocols used: FACE PAIN-A-AH

## 2019-04-21 NOTE — Telephone Encounter (Signed)
Pt reports facial pain, forehead and "Bridge of nose." Onset 3 days ago. Also reports earaches "At times." Denies cough, unsure if febrile states "Chills at times." Pt also reports "Food poisening yesterday." States other family members ate same food and had nausea and vomiting also. Pt states diarrhea, vomiting yesterday, none today "It's over." Sounds weak on call but states she is staying hydrated, drinking Gatorade and water. Denies dizziness, no S/S dehydration. Pt reports h/o sinus infections "This feels like those." After hours call. Assured pt NT would route to practice for consideration of appt. within 24 hrs. Directed to UC/ED if symptoms worsen. Pt verbalizes understanding.  Please advise: CB# [phone] Reason for Disposition . Face pain present > 24 hours  Answer Assessment - Initial Assessment Questions 1. ONSET: "When did the pain start?" (e.g., minutes, hours, days)     3 days ago 2. ONSET: "Does the pain come and go, or has it been constant since it started?" (e.g., constant, intermittent, fleeting)     constant 3. SEVERITY: "How bad is the pain?"   (Scale 1-10; mild, moderate or severe)   - MILD (1-3): doesn't interfere with normal activities    - MODERATE (4-7): interferes with normal activities or awakens from sleep    - SEVERE (8-10): excruciating pain, unable to do any normal activities      7/10 4. LOCATION: "Where does it hurt?"      Across forehead, bridge of nose, ear ache at times 5. RASH: "Is there any redness, rash, or swelling of the face?"     no 6. FEVER: "Do you have a fever?" If so, ask: "What is it, how was it measured, and when did it start?"     chills 7. OTHER SYMPTOMS: "Do you have any other symptoms?" (e.g., fever, toothache, nasal discharge, nasal congestion, clicking sensation in jaw joint)     No, Weak from "food poisoning."  Protocols used: FACE PAIN-A-AH

## 2019-04-22 ENCOUNTER — Other Ambulatory Visit: Payer: Self-pay

## 2019-04-22 ENCOUNTER — Encounter: Payer: Self-pay | Admitting: Family Medicine

## 2019-04-22 ENCOUNTER — Telehealth (INDEPENDENT_AMBULATORY_CARE_PROVIDER_SITE_OTHER): Admitting: Family Medicine

## 2019-04-22 DIAGNOSIS — R112 Nausea with vomiting, unspecified: Secondary | ICD-10-CM

## 2019-04-22 DIAGNOSIS — R197 Diarrhea, unspecified: Secondary | ICD-10-CM

## 2019-04-22 NOTE — Telephone Encounter (Signed)
Left a message for a return call.  Pt needs virtual appointment.

## 2019-04-22 NOTE — Progress Notes (Signed)
Virtual Visit via Video Note  I connected with April Gonzalez  on 04/22/19 at  4:00 PM EST by a video enabled telemedicine application and verified that I am speaking with the correct person using two identifiers.  Location patient: home Location provider:work or home office Persons participating in the virtual visit: patient, provider  I discussed the limitations of evaluation and management by telemedicine and the availability of in person appointments. The patient expressed understanding and agreed to proceed.   HPI:  Acute sick visit: -symptoms started: about 3 days ago -symptoms included: diarrhea, vomiting, HA for two days, today feels much better - she is drinking fluids, eating normally a bland diet today and actually now feels back to normal with no symptoms currently -she bought some chicken at [place] recently that was on sale and they wonder if this was food poisoning as several family members started to get sick that day, she got sick the next day - the other family members got better quickly -she wonders what would be good  to eat now -no known sick exposures otherwise  ROS: See pertinent positives and negatives per HPI.  Past Medical History:  Diagnosis Date  . Allergy   . Anemia 05/2012   transfusion  . Fibroids    H/O myomectomy  . GERD (gastroesophageal reflux disease)    rare   . H/O uterine prolapse   . History of blood transfusion    x 3   . Hyperlipidemia   . Hypertension   . Rectal bleeding    NOTED AT pv 01-09-17- PT STATES with most BM's for the last 2 years, also hurts   . Seizures ([place])    last seizure age 62 or 10 per pt report-01-09-17 no change in this  . Thyroid disorder     Past Surgical History:  Procedure Laterality Date  . ANKLE SURGERY     One plate, seven screws in the right ankle   . BLADDER SUSPENSION N/A 12/10/2012   Procedure: TRANSVAGINAL TAPE (TVT) PROCEDURE;  Surgeon: Delice Lesch, MD;  Location: [facility];  Service: Gynecology;  Laterality:  N/A;  . CYSTOCELE REPAIR N/A 12/10/2012   Procedure: ANTERIOR REPAIR (CYSTOCELE);  Surgeon: Eldred Manges, MD;  Location: [facility];  Service: Gynecology;  Laterality: N/A;  . CYSTOSCOPY N/A 12/10/2012   Procedure: CYSTOSCOPY;  Surgeon: Delice Lesch, MD;  Location: [facility];  Service: Gynecology;  Laterality: N/A;  . EXCISION VAGINAL CYST    . MYOMECTOMY  16YRS AGO   HYSTEROSCOPIC  . TUBAL LIGATION  10/10/1991  . VAGINAL HYSTERECTOMY N/A 12/10/2012   Procedure: Total Vaginal Hysterectomy;  Surgeon: Eldred Manges, MD;  Location: [facility];  Service: Gynecology;  Laterality: N/A;    Family History  Problem Relation Age of Onset  . Heart failure Father   . Diabetes Mother   . High blood pressure Mother   . Arthritis Mother   . Breast cancer Maternal Aunt        in her 42's   . Breast cancer Cousin   . Thyroid disease Sister   . Colon cancer Neg Hx   . Colon polyps Neg Hx   . Esophageal cancer Neg Hx   . Rectal cancer Neg Hx   . Stomach cancer Neg Hx     SOCIAL HX: see hpi   Current Outpatient Medications:  .  acetaminophen (TYLENOL) 500 MG tablet, Take 500-1,000 mg by mouth every 6 (six) hours as needed for mild pain or headache.,  Disp: , Rfl:  .  EPINEPHrine (EPIPEN 2-PAK) 0.3 mg/0.3 mL IJ SOAJ injection, Inject 0.3 mLs (0.3 mg total) into the muscle once. If you develop difficulty breathing, Disp: 2 Device, Rfl: 0 .  Omega-3 Fatty Acids (FISH OIL) 1000 MG CAPS, Take 2 capsules (2,000 mg total) by mouth daily., Disp: 180 capsule, Rfl: 1 .  valACYclovir (VALTREX) 500 MG tablet, Take 500 mg by mouth 2 (two) times daily., Disp: , Rfl:   EXAM:  VITALS per patient if applicable:  GENERAL: alert, oriented, appears well and in no acute distress  HEENT: atraumatic, conjunttiva clear, no obvious abnormalities on inspection of external nose and ears  NECK: normal movements of the head and neck  LUNGS: on inspection no signs of respiratory distress, breathing rate appears normal, no  obvious gross SOB, gasping or wheezing  CV: no obvious cyanosis  MS: moves all visible extremities without noticeable abnormality  PSYCH/NEURO: pleasant and cooperative, no obvious depression or anxiety, speech and thought processing grossly intact  ASSESSMENT AND PLAN:  Discussed the following assessment and plan:  Nausea and vomiting, intractability of vomiting not specified, unspecified vomiting type  Diarrhea, unspecified type  -we discussed possible serious and likely etiologies, options for evaluation and workup, limitations of telemedicine visit vs in person visit, treatment, treatment risks and precautions. Pt prefers to treat via telemedicine empirically rather then risking or undertaking an in person visit at this moment. At the time of this appointment, her symptoms thankfully have resolved and she reports all other family members whom had the same symptoms are all better too. Suspect gastroenteritis form the food or other and discussed potential causes. balnd diet, avoidance of dairy and red meat for the next 1 week and avoidance of the food they feel caused the symptoms. Patient agrees to seek prompt in person care if recurrent symptoms or new symptoms arise.   I discussed the assessment and treatment plan with the patient. The patient was provided an opportunity to ask questions and all were answered. The patient agreed with the plan and demonstrated an understanding of the instructions.   The patient was advised to call back or seek an in-person evaluation if the symptoms worsen or if the condition fails to improve as anticipated.   April Kern, DO

## 2019-04-22 NOTE — Telephone Encounter (Signed)
Message Routed to PCP CMA 

## 2019-04-22 NOTE — Telephone Encounter (Signed)
Pt seen Dr. Maudie Mercury today at 4 PM.

## 2019-06-03 ENCOUNTER — Telehealth: Payer: Self-pay | Admitting: Adult Health

## 2019-06-03 NOTE — Telephone Encounter (Signed)
Pt need a refer for a head concussion

## 2019-06-04 ENCOUNTER — Telehealth: Payer: Self-pay | Admitting: Adult Health

## 2019-06-04 ENCOUNTER — Other Ambulatory Visit: Payer: Self-pay

## 2019-06-04 ENCOUNTER — Telehealth (INDEPENDENT_AMBULATORY_CARE_PROVIDER_SITE_OTHER): Admitting: Adult Health

## 2019-06-04 DIAGNOSIS — G8929 Other chronic pain: Secondary | ICD-10-CM

## 2019-06-04 DIAGNOSIS — R519 Headache, unspecified: Secondary | ICD-10-CM

## 2019-06-04 NOTE — Telephone Encounter (Signed)
Called pt no answer. Called to schedule pt an appt.

## 2019-06-04 NOTE — Telephone Encounter (Signed)
Pt has been scheduled.  °

## 2019-06-04 NOTE — Telephone Encounter (Signed)
Pt returning kendra call about referral appt  To the  concussion clinic callback number is  [phone]
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
## 2019-06-15 ENCOUNTER — Ambulatory Visit: Attending: Internal Medicine

## 2019-06-15 DIAGNOSIS — Z20822 Contact with and (suspected) exposure to covid-19: Secondary | ICD-10-CM

## 2019-06-16 LAB — NOVEL CORONAVIRUS, NAA: SARS-CoV-2, NAA: NOT DETECTED

## 2019-06-18 ENCOUNTER — Telehealth (INDEPENDENT_AMBULATORY_CARE_PROVIDER_SITE_OTHER): Admitting: Adult Health

## 2019-06-18 ENCOUNTER — Encounter: Payer: Self-pay | Admitting: Adult Health

## 2019-06-18 ENCOUNTER — Ambulatory Visit: Admitting: Adult Health

## 2019-06-18 ENCOUNTER — Other Ambulatory Visit: Payer: Self-pay

## 2019-06-18 DIAGNOSIS — R519 Headache, unspecified: Secondary | ICD-10-CM

## 2019-06-18 DIAGNOSIS — G8929 Other chronic pain: Secondary | ICD-10-CM
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
## 2019-06-19 ENCOUNTER — Ambulatory Visit
Admission: RE | Admit: 2019-06-19 | Discharge: 2019-06-19 | Disposition: A | Discharge status: Home / Self Care | Source: Ambulatory Visit | Attending: Adult Health | Admitting: Adult Health

## 2019-06-19 DIAGNOSIS — R519 Headache, unspecified: Secondary | ICD-10-CM

## 2019-06-19 MED ORDER — GADOBENATE DIMEGLUMINE 529 MG/ML IV SOLN
17.0000 mL | Freq: Once | INTRAVENOUS | Status: AC | PRN
  Administered 2019-06-19: 13:00:00 17 mL via INTRAVENOUS

## 2019-06-24 ENCOUNTER — Encounter: Payer: Self-pay | Admitting: Neurology

## 2019-06-24 ENCOUNTER — Telehealth (INDEPENDENT_AMBULATORY_CARE_PROVIDER_SITE_OTHER): Admitting: Neurology

## 2019-06-24 DIAGNOSIS — R569 Unspecified convulsions: Secondary | ICD-10-CM

## 2019-06-24 DIAGNOSIS — R519 Headache, unspecified: Secondary | ICD-10-CM

## 2019-06-24 MED ORDER — NORTRIPTYLINE HCL 10 MG PO CAPS: ORAL_CAPSULE | ORAL | 3 refills | Status: DC
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
## 2019-06-25 ENCOUNTER — Telehealth: Payer: Self-pay | Admitting: Adult Health

## 2019-06-25 ENCOUNTER — Other Ambulatory Visit

## 2019-06-25 NOTE — Telephone Encounter (Signed)
Pt had an appointment yesterday with Dr. Jannifer Franklin. Pt has a pain on her head, dizzy, and feels funny. Dr. Jannifer Franklin did prescribed medication. Pt is not sure if she needs to be seen in the office? Thanks

## 2019-06-28 ENCOUNTER — Other Ambulatory Visit: Payer: Self-pay

## 2019-06-28 NOTE — Telephone Encounter (Signed)
Please advise 

## 2019-06-28 NOTE — Telephone Encounter (Signed)
Called pt 2x no answer. PT will need to schedule a vv since issue is reoccurring or pt can go to local UC  or ED.

## 2019-06-29 ENCOUNTER — Other Ambulatory Visit (INDEPENDENT_AMBULATORY_CARE_PROVIDER_SITE_OTHER)

## 2019-06-29 ENCOUNTER — Encounter: Payer: Self-pay | Admitting: Adult Health

## 2019-06-29 ENCOUNTER — Telehealth: Payer: Self-pay | Admitting: Neurology

## 2019-06-29 DIAGNOSIS — G8929 Other chronic pain: Secondary | ICD-10-CM | POA: Diagnosis not present

## 2019-06-29 DIAGNOSIS — R519 Headache, unspecified: Secondary | ICD-10-CM

## 2019-06-29 LAB — CBC WITH DIFFERENTIAL/PLATELET
Basophils Absolute: 0.1 10*3/uL (ref 0.0–0.1)
Basophils Relative: 0.8 % (ref 0.0–3.0)
Eosinophils Absolute: 0.2 10*3/uL (ref 0.0–0.7)
Eosinophils Relative: 2.7 % (ref 0.0–5.0)
HCT: 43.1 % (ref 36.0–46.0)
Hemoglobin: 14.3 g/dL (ref 12.0–15.0)
Lymphocytes Relative: 36.3 % (ref 12.0–46.0)
Lymphs Abs: 2.2 10*3/uL (ref 0.7–4.0)
MCHC: 33.2 g/dL (ref 30.0–36.0)
MCV: 87.5 fl (ref 78.0–100.0)
Monocytes Absolute: 0.6 10*3/uL (ref 0.1–1.0)
Monocytes Relative: 9.8 % (ref 3.0–12.0)
Neutro Abs: 3.1 10*3/uL (ref 1.4–7.7)
Neutrophils Relative %: 50.4 % (ref 43.0–77.0)
Platelets: 220 10*3/uL (ref 150.0–400.0)
RBC: 4.92 Mil/uL (ref 3.87–5.11)
RDW: 13.4 % (ref 11.5–15.5)
WBC: 6.2 10*3/uL (ref 4.0–10.5)

## 2019-06-29 LAB — BASIC METABOLIC PANEL
BUN: 11 mg/dL (ref 6–23)
CO2: 28 mEq/L (ref 19–32)
Calcium: 9.9 mg/dL (ref 8.4–10.5)
Chloride: 102 mEq/L (ref 96–112)
Creatinine, Ser: 0.81 mg/dL (ref 0.40–1.20)
GFR: 88.16 mL/min (ref >60.00)
Glucose, Bld: 79 mg/dL (ref 70–99)
Potassium: 4.4 mEq/L (ref 3.5–5.1)
Sodium: 136 mEq/L (ref 135–145)

## 2019-06-29 LAB — C-REACTIVE PROTEIN: CRP: <1 mg/dL (ref 0.5–20.0)

## 2019-06-29 LAB — SEDIMENTATION RATE: Sed Rate: 18 mm/hr (ref 0–30)

## 2019-06-29 MED ORDER — PREDNISONE 10 MG PO TABS: 40.0000 mg | ORAL_TABLET | Freq: Every day | ORAL | 0 refills | Status: DC

## 2019-06-29 NOTE — Telephone Encounter (Signed)
Patient called stating she is having issues with her nortriptyline (PAMELOR) 10 MG capsule states she has been having trouble breathing her face and throat  is red and swollen/puffy  Advised to go to the ED or UC if needing immediate assistance and/or symptoms worsen

## 2019-06-29 NOTE — Telephone Encounter (Signed)
I called pt about having side effects to the nortriptyline after taking three dosages.Pt stated her face became swollen and red and trouble breathing.She took a benadryl and it resolve. She is still having some problems swallowing but its getting better. PT did stop the medication. Pt is still having headaches like a stabbing pain in her head and wants something for headaches. I stated message will be sent to work in md. PT verbalized understanding.

## 2019-06-29 NOTE — Addendum Note (Signed)
Addended by: Larey Seat on: 06/29/2019 01:09 PM   Modules accepted: Orders

## 2019-06-29 NOTE — Telephone Encounter (Signed)
Prednisone 10 mg tab, take 4 in Am for 5 days to treat headaches and medication side effects.

## 2019-06-30 ENCOUNTER — Other Ambulatory Visit (INDEPENDENT_AMBULATORY_CARE_PROVIDER_SITE_OTHER)

## 2019-06-30 DIAGNOSIS — E039 Hypothyroidism, unspecified: Secondary | ICD-10-CM | POA: Diagnosis not present

## 2019-06-30 LAB — TSH: TSH: 2.34 u[IU]/mL (ref 0.35–4.50)

## 2019-06-30 NOTE — Telephone Encounter (Signed)
Spoke to the pt.  Advised that Dr. Asencion Partridge Dohmeier sent in a prescription for prednisone for the head pain and to treat the sx of the reaction.  Pt stated she will pick up this evening and start it in the morning.  She will call back if needed.

## 2019-07-07 ENCOUNTER — Telehealth: Payer: Self-pay | Admitting: Adult Health

## 2019-07-07 NOTE — Telephone Encounter (Signed)
Need further clinical information.  Tried to reach the pt by telephone and received an automated message that the voicemail box is full and cannot accept messages.  Will try again at a later time.

## 2019-07-07 NOTE — Telephone Encounter (Signed)
Pt would like a call back, she says she is not feeling any better.

## 2019-07-07 NOTE — Telephone Encounter (Signed)
Pt is scheduled for virtual visit on 07/08/2019.  Nothing further needed.

## 2019-07-08 ENCOUNTER — Other Ambulatory Visit: Payer: Self-pay

## 2019-07-08 ENCOUNTER — Telehealth (INDEPENDENT_AMBULATORY_CARE_PROVIDER_SITE_OTHER): Admitting: Adult Health

## 2019-07-08 ENCOUNTER — Telehealth: Payer: Self-pay | Admitting: Adult Health

## 2019-07-08 DIAGNOSIS — R29898 Other symptoms and signs involving the musculoskeletal system: Secondary | ICD-10-CM

## 2019-07-08 DIAGNOSIS — R519 Headache, unspecified: Secondary | ICD-10-CM

## 2019-07-08 DIAGNOSIS — G8929 Other chronic pain: Secondary | ICD-10-CM

## 2019-07-08 NOTE — Telephone Encounter (Signed)
Pt notified that a second referral has been placed.  Nothing further needed.

## 2019-07-08 NOTE — Telephone Encounter (Signed)
Pt called neurology to schedule appt. They do not have anything until 3/30 she wanted to know if Tommi Rumps could refer her to another neurologist that may have something sooner.

## 2019-07-09 NOTE — Progress Notes (Signed)
Virtual Visit via Telephone Note  I connected with April Gonzalez on 07/09/19 at 10:30 AM EST by telephone and verified that I am speaking with the correct person using two identifiers.   I discussed the limitations, risks, security and privacy concerns of performing an evaluation and management service by telephone and the availability of in person appointments. I also discussed with the patient that there may be a patient responsible charge related to this service. The patient expressed understanding and agreed to proceed.  Location patient: home Location provider: work or home office Participants present for the call: patient, provider Patient did not have a visit in the prior 7 days to address this/these issue(s).   History of Present Illness: 59 year old female who is being evaluated today for decreased grip strength bilaterally.  This seems to be an ongoing issue with the patient but she reports that over the last week or 2 she has been experiencing worsening symptoms.  Per patient report she is unable to hold onto items without dropping them with most of these items and a very little weight.  This happens more times than not when holding onto an item.  She also reports that she has difficulty at times making a fist.  She denies numbness or tingling.  She has not experienced any other aggravating factors and denies trauma.  Was recently seen by neurology for chronic headaches and prescribed nortriptyline to which she had an allergic reaction where she was having trouble breathing and her face and throat were red and swollen.  She was prescribed prednisone and her symptoms resolved.  Her she does report that her headaches are improved but she feels as though she is "foggy headed".   Observations/Objective: Patient sounds cheerful and well on the phone. I do not appreciate any SOB. Speech and thought processing are grossly intact. Patient reported vitals:  Assessment and Plan: 1. Decreased  grip strength -We will have her follow-up with neurology for this issue.  She will call and make an appointment today.  She was advised if her symptoms continue to worsen then to follow-up in the office or go to the emergency room.   Follow Up Instructions:  I did not refer this patient for an OV in the next 24 hours for this/these issue(s).  I discussed the assessment and treatment plan with the patient. The patient was provided an opportunity to ask questions and all were answered. The patient agreed with the plan and demonstrated an understanding of the instructions.   The patient was advised to call back or seek an in-person evaluation if the symptoms worsen or if the condition fails to improve as anticipated.  I provided 15 minutes of non-face-to-face time during this encounter.   [name], NP

## 2019-07-13 ENCOUNTER — Encounter: Payer: Self-pay | Admitting: Adult Health

## 2019-07-13 ENCOUNTER — Telehealth (INDEPENDENT_AMBULATORY_CARE_PROVIDER_SITE_OTHER): Admitting: Adult Health

## 2019-07-13 DIAGNOSIS — J0100 Acute maxillary sinusitis, unspecified: Secondary | ICD-10-CM

## 2019-07-13 MED ORDER — AZITHROMYCIN 250 MG PO TABS: ORAL_TABLET | ORAL | 0 refills | Status: DC

## 2019-07-13 NOTE — Progress Notes (Signed)
Virtual Visit via Video Note  I connected with April Gonzalez  on 07/13/19 at  4:30 PM EST by a video enabled telemedicine application and verified that I am speaking with the correct person using two identifiers.  Location patient: home Location provider:work or home office Persons participating in the virtual visit: patient, provider  I discussed the limitations of evaluation and management by telemedicine and the availability of in person appointments. The patient expressed understanding and agreed to proceed.   HPI:  59 year old female who is being evaluated today for an acute issue of sinusitis.  She reports that her symptoms started 4 to 5 days ago and have been progressively worsening.  She reports pain and pressure as well as swelling around her maxillary sinuses.  She has had some rhinorrhea but no postnasal drip, fevers, or chills.  She has been using Mucinex sinus without any improvement  ROS: See pertinent positives and negatives per HPI.  Past Medical History:  Diagnosis Date  . Allergy   . Anemia 05/2012   transfusion  . Fibroids    H/O myomectomy  . GERD (gastroesophageal reflux disease)    rare   . H/O uterine prolapse   . History of blood transfusion    x 3   . Hyperlipidemia   . Hypertension   . Rectal bleeding    NOTED AT pv 01-09-17- PT STATES with most BM's for the last 2 years, also hurts   . Seizures ([place])    last seizure age 47 or 10 per pt report-01-09-17 no change in this  . Thyroid disorder     Past Surgical History:  Procedure Laterality Date  . ANKLE SURGERY     One plate, seven screws in the right ankle   . BLADDER SUSPENSION N/A 12/10/2012   Procedure: TRANSVAGINAL TAPE (TVT) PROCEDURE;  Surgeon: Delice Lesch, MD;  Location: [facility];  Service: Gynecology;  Laterality: N/A;  . CYSTOCELE REPAIR N/A 12/10/2012   Procedure: ANTERIOR REPAIR (CYSTOCELE);  Surgeon: Eldred Manges, MD;  Location: [facility];  Service: Gynecology;  Laterality: N/A;  .  CYSTOSCOPY N/A 12/10/2012   Procedure: CYSTOSCOPY;  Surgeon: Delice Lesch, MD;  Location: [facility];  Service: Gynecology;  Laterality: N/A;  . EXCISION VAGINAL CYST    . MYOMECTOMY  31YRS AGO   HYSTEROSCOPIC  . TUBAL LIGATION  10/10/1991  . VAGINAL HYSTERECTOMY N/A 12/10/2012   Procedure: Total Vaginal Hysterectomy;  Surgeon: Eldred Manges, MD;  Location: [facility];  Service: Gynecology;  Laterality: N/A;    Family History  Problem Relation Age of Onset  . Heart failure Father   . Diabetes Mother   . High blood pressure Mother   . Arthritis Mother   . Breast cancer Maternal Aunt        in her 73's   . Breast cancer Cousin   . Thyroid disease Sister   . Colon cancer Neg Hx   . Colon polyps Neg Hx   . Esophageal cancer Neg Hx   . Rectal cancer Neg Hx   . Stomach cancer Neg Hx        Current Outpatient Medications:  .  acetaminophen (TYLENOL) 500 MG tablet, Take 500-1,000 mg by mouth every 6 (six) hours as needed for mild pain or headache., Disp: , Rfl:  .  EPINEPHrine (EPIPEN 2-PAK) 0.3 mg/0.3 mL IJ SOAJ injection, Inject 0.3 mLs (0.3 mg total) into the muscle once. If you develop difficulty breathing, Disp: 2 Device, Rfl: 0 .  nortriptyline (PAMELOR) 10 MG capsule, Take one capsule at night for one week, then take 2 capsules at night for one week, then take 3 capsules at night, Disp: 90 capsule, Rfl: 3 .  predniSONE (DELTASONE) 10 MG tablet, Take 4 tablets (40 mg total) by mouth daily with breakfast. Take for 5 days in AM-, Disp: 20 tablet, Rfl: 0 .  valACYclovir (VALTREX) 500 MG tablet, Take 500 mg by mouth 2 (two) times daily., Disp: , Rfl:   EXAM:  VITALS per patient if applicable:  GENERAL: alert, oriented, appears acutely ill.   HEENT: atraumatic, conjunttiva clear, no obvious abnormalities on inspection of external nose and ears.Maxillary sinus area swollen   NECK: normal movements of the head and neck  LUNGS: on inspection no signs of respiratory distress, breathing  rate appears normal, no obvious gross SOB, gasping or wheezing  CV: no obvious cyanosis  MS: moves all visible extremities without noticeable abnormality  PSYCH/NEURO: pleasant and cooperative, no obvious depression or anxiety, speech and thought processing grossly intact  ASSESSMENT AND PLAN:  Discussed the following assessment and plan:  1. Acute non-recurrent maxillary sinusitis -Treat due to symptoms.  She has a contraindication with tetracycline and an allergy to penicillins.  Will send in [place].  She was advised to follow-up if no improvement - azithromycin (ZITHROMAX Z-PAK) 250 MG tablet; Take 2 tablets on Day 1.  Then take 1 tablet daily.  Dispense: 6 tablet; Refill: 0     I discussed the assessment and treatment plan with the patient. The patient was provided an opportunity to ask questions and all were answered. The patient agreed with the plan and demonstrated an understanding of the instructions.   The patient was advised to call back or seek an in-person evaluation if the symptoms worsen or if the condition fails to improve as anticipated.   [name], NP

## 2019-07-22 ENCOUNTER — Ambulatory Visit: Attending: Internal Medicine

## 2019-07-22 DIAGNOSIS — Z23 Encounter for immunization: Secondary | ICD-10-CM

## 2019-07-22 NOTE — Progress Notes (Signed)
   Covid-19 Vaccination Clinic  Name:  April Gonzalez    MRN: [id number] DOB: 12-14-1960  07/22/2019  [name] was observed post Covid-19 immunization for 30 minutes based on pre-vaccination screening without incident. She was provided with Vaccine Information Sheet and instruction to access the V-Safe system.   [name] was instructed to call 911 with any severe reactions post vaccine: Marland Kitchen Difficulty breathing  . Swelling of face and throat  . A fast heartbeat  . A bad rash all over body  . Dizziness and weakness   Immunizations Administered    Name Date Dose VIS Date Route   Pfizer COVID-19 Vaccine 07/22/2019 12:36 PM 0.3 mL 04/16/2019 Intramuscular   Manufacturer: [place]   Lot: [id number]   [place]: [id number]

## 2019-08-03 ENCOUNTER — Ambulatory Visit: Admitting: Neurology

## 2019-08-16 ENCOUNTER — Ambulatory Visit: Attending: Internal Medicine

## 2019-08-16 DIAGNOSIS — Z23 Encounter for immunization: Secondary | ICD-10-CM

## 2019-08-16 NOTE — Progress Notes (Signed)
   Covid-19 Vaccination Clinic  Name:  April Gonzalez    MRN: [id number] DOB: 10/22/1960  08/16/2019  [name] was observed post Covid-19 immunization for 30 minutes based on pre-vaccination screening without incident. She was provided with Vaccine Information Sheet and instruction to access the V-Safe system.   [name] was instructed to call 911 with any severe reactions post vaccine: Marland Kitchen Difficulty breathing  . Swelling of face and throat  . A fast heartbeat  . A bad rash all over body  . Dizziness and weakness   Immunizations Administered    Name Date Dose VIS Date Route   Pfizer COVID-19 Vaccine 08/16/2019 11:06 AM 0.3 mL 04/16/2019 Intramuscular   Manufacturer: [place]   Lot: [id number]   [place]: [id number]

## 2019-09-06 ENCOUNTER — Other Ambulatory Visit: Payer: Self-pay

## 2019-09-06 ENCOUNTER — Ambulatory Visit (INDEPENDENT_AMBULATORY_CARE_PROVIDER_SITE_OTHER): Admitting: Neurology

## 2019-09-06 ENCOUNTER — Encounter: Payer: Self-pay | Admitting: Neurology

## 2019-09-06 VITALS — BP 150/80 | HR 78 | Temp 97.2°F | Ht 63.0 in | Wt 187.0 lb

## 2019-09-06 DIAGNOSIS — R29898 Other symptoms and signs involving the musculoskeletal system: Secondary | ICD-10-CM

## 2019-09-06 DIAGNOSIS — R519 Headache, unspecified: Secondary | ICD-10-CM

## 2019-09-06 MED ORDER — GABAPENTIN 300 MG PO CAPS: 300.0000 mg | ORAL_CAPSULE | Freq: Two times a day (BID) | ORAL | 5 refills | Status: DC
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
## 2019-09-06 NOTE — Patient Instructions (Signed)
Try taking gabapentin 300 mg twice daily for your headaches, start taking it at night first, before taking it during the day  Will order Nerve conduction to evaluate your symptoms  See you back in 4 months

## 2019-09-08 NOTE — Progress Notes (Signed)
I have read the note, and I agree with the clinical assessment and plan.  Rosely Fernandez K Amandajo Gonder   

## 2019-09-09 ENCOUNTER — Ambulatory Visit: Admitting: Neurology

## 2019-09-10 ENCOUNTER — Encounter: Payer: Self-pay | Admitting: Adult Health

## 2019-09-10 ENCOUNTER — Telehealth (INDEPENDENT_AMBULATORY_CARE_PROVIDER_SITE_OTHER): Admitting: Adult Health

## 2019-09-10 VITALS — BP 150/80 | Temp 98.6°F | Wt 187.0 lb

## 2019-09-10 DIAGNOSIS — H9203 Otalgia, bilateral: Secondary | ICD-10-CM | POA: Diagnosis not present

## 2019-09-10 NOTE — Progress Notes (Signed)
Virtual Visit via Video Note  I connected with April Gonzalez on 09/10/19 at 11:30 AM EDT by a video enabled telemedicine application and verified that I am speaking with the correct person using two identifiers.  Location patient: home Location provider:work or home office Persons participating in the virtual visit: patient, provider  I discussed the limitations of evaluation and management by telemedicine and the availability of in person appointments. The patient expressed understanding and agreed to proceed.   HPI: She was seen at [facility] urgent care on 09/05/2019 for evaluation of bilateral ear pain and congestion.  Her symptoms have been present for a few days prior to presentation.  She had bilateral cerumen impactions which were removed with irrigation and was noted also noticed that she had swollen and tender canals bilaterally.  She was placed on Ciprodex for otitis externa.  Today she reports that she continues to have pain in her ears.  Pain is better after she uses her antibiotic drops but as the day progresses the pain gets worse.   She denies fevers, chills, or feeling ill    ROS: See pertinent positives and negatives per HPI.  Past Medical History:  Diagnosis Date  . Allergy   . Anemia 05/2012   transfusion  . Fibroids    H/O myomectomy  . GERD (gastroesophageal reflux disease)    rare   . H/O uterine prolapse   . History of blood transfusion    x 3   . Hyperlipidemia   . Hypertension   . Rectal bleeding    NOTED AT pv 01-09-17- PT STATES with most BM's for the last 2 years, also hurts   . Seizures ([place])    last seizure age 58 or 10 per pt report-01-09-17 no change in this  . Thyroid disorder     Past Surgical History:  Procedure Laterality Date  . ANKLE SURGERY     One plate, seven screws in the right ankle   . BLADDER SUSPENSION N/A 12/10/2012   Procedure: TRANSVAGINAL TAPE (TVT) PROCEDURE;  Surgeon: Delice Lesch, MD;  Location: [facility];  Service: Gynecology;   Laterality: N/A;  . CYSTOCELE REPAIR N/A 12/10/2012   Procedure: ANTERIOR REPAIR (CYSTOCELE);  Surgeon: Eldred Manges, MD;  Location: [facility];  Service: Gynecology;  Laterality: N/A;  . CYSTOSCOPY N/A 12/10/2012   Procedure: CYSTOSCOPY;  Surgeon: Delice Lesch, MD;  Location: [facility];  Service: Gynecology;  Laterality: N/A;  . EXCISION VAGINAL CYST    . MYOMECTOMY  60YRS AGO   HYSTEROSCOPIC  . TUBAL LIGATION  10/10/1991  . VAGINAL HYSTERECTOMY N/A 12/10/2012   Procedure: Total Vaginal Hysterectomy;  Surgeon: Eldred Manges, MD;  Location: [facility];  Service: Gynecology;  Laterality: N/A;    Family History  Problem Relation Age of Onset  . Heart failure Father   . Diabetes Mother   . High blood pressure Mother   . Arthritis Mother   . Breast cancer Maternal Aunt        in her 83's   . Breast cancer Cousin   . Thyroid disease Sister   . Colon cancer Neg Hx   . Colon polyps Neg Hx   . Esophageal cancer Neg Hx   . Rectal cancer Neg Hx   . Stomach cancer Neg Hx        Current Outpatient Medications:  .  acetaminophen (TYLENOL) 500 MG tablet, Take 500-1,000 mg by mouth every 6 (six) hours as needed for mild pain or  headache., Disp: , Rfl:  .  valACYclovir (VALTREX) 500 MG tablet, Take 500 mg by mouth 2 (two) times daily., Disp: , Rfl:  .  EPINEPHrine (EPIPEN 2-PAK) 0.3 mg/0.3 mL IJ SOAJ injection, Inject 0.3 mLs (0.3 mg total) into the muscle once. If you develop difficulty breathing (Patient not taking: Reported on 09/06/2019), Disp: 2 Device, Rfl: 0 .  gabapentin (NEURONTIN) 300 MG capsule, Take 1 capsule (300 mg total) by mouth 2 (two) times daily. (Patient not taking: Reported on 09/10/2019), Disp: 60 capsule, Rfl: 5  EXAM:  VITALS per patient if applicable:  GENERAL: alert, oriented, appears well and in no acute distress  HEENT: atraumatic, conjunttiva clear, no obvious abnormalities on inspection of external nose and ears  NECK: normal movements of the head and  neck  LUNGS: on inspection no signs of respiratory distress, breathing rate appears normal, no obvious gross SOB, gasping or wheezing  CV: no obvious cyanosis  MS: moves all visible extremities without noticeable abnormality  PSYCH/NEURO: pleasant and cooperative, no obvious depression or anxiety, speech and thought processing grossly intact  ASSESSMENT AND PLAN:  Discussed the following assessment and plan: 1. Acute ear pain, bilateral -Encouraged to continue to use eardrops for the next 5 days to complete therapy.  If no improvement or resolution by that time that she can follow-up in the office.  She can also add Flonase in case she has some eustachian tube dysfunction that is causing discomfort. -Reviewed notes from urgent care     I discussed the assessment and treatment plan with the patient. The patient was provided an opportunity to ask questions and all were answered. The patient agreed with the plan and demonstrated an understanding of the instructions.   The patient was advised to call back or seek an in-person evaluation if the symptoms worsen or if the condition fails to improve as anticipated.   [name], NP

## 2019-09-28 ENCOUNTER — Ambulatory Visit: Payer: Self-pay | Admitting: Neurology

## 2019-09-28 ENCOUNTER — Encounter: Payer: Self-pay | Admitting: Neurology

## 2019-09-28 ENCOUNTER — Ambulatory Visit (INDEPENDENT_AMBULATORY_CARE_PROVIDER_SITE_OTHER): Admitting: Neurology

## 2019-09-28 ENCOUNTER — Other Ambulatory Visit: Payer: Self-pay

## 2019-09-28 DIAGNOSIS — Z0289 Encounter for other administrative examinations: Secondary | ICD-10-CM

## 2019-09-28 DIAGNOSIS — R202 Paresthesia of skin: Secondary | ICD-10-CM

## 2019-09-28 DIAGNOSIS — R519 Headache, unspecified: Secondary | ICD-10-CM

## 2019-09-28 DIAGNOSIS — R29898 Other symptoms and signs involving the musculoskeletal system: Secondary | ICD-10-CM
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
## 2019-09-28 NOTE — Progress Notes (Signed)
Please refer to EMG and nerve conduction procedure note.  

## 2019-09-28 NOTE — Progress Notes (Addendum)
The patient comes in for EMG nerve conduction study evaluation today.  She is having tingling down the right arm and dropping things, she reports burning hot sensations in the back of the head and neck, EMG and nerve conduction study were completely normal, we will get the patient set up for neuromuscular therapy on the neck and shoulders.  CT of the cervical spine was done in August 2020, no significant structural changes were seen.   CT cervical 01/02/19:  IMPRESSION: 1. No acute intracranial pathology. 2. No acute/traumatic cervical spine pathology.      Egan    Nerve / Sites Muscle Latency Ref. Amplitude Ref. Rel Amp Segments Distance Velocity Ref. Area    ms ms mV mV %  cm m/s m/s mVms  R Median - APB     Wrist APB 3.6 ?4.4 5.4 ?4.0 100 Wrist - APB 7   18.5     Upper arm APB 7.5  5.0  92.4 Upper arm - Wrist 21 53 ?49 17.3  R Ulnar - ADM     Wrist ADM 2.1 ?3.3 11.9 ?6.0 100 Wrist - ADM 7   33.4     B.Elbow ADM 5.4  11.6  97.5 B.Elbow - Wrist 21 64 ?49 33.2     A.Elbow ADM 6.9  10.8  92.8 A.Elbow - B.Elbow 10 64 ?49 31.5         SNC    Nerve / Sites Rec. Site Peak Lat Ref.  Amp Ref. Segments Distance    ms ms V V  cm  R Median - Orthodromic (Dig II, Mid palm)     Dig II Wrist 2.9 ?3.4 20 ?10 Dig II - Wrist 13  R Ulnar - Orthodromic, (Dig V, Mid palm)     Dig V Wrist 2.5 ?3.1 12 ?5 Dig V - Wrist 28         F  Wave    Nerve F Lat Ref.   ms ms  R Ulnar - ADM 25.3 ?32.0

## 2019-10-05 ENCOUNTER — Telehealth: Payer: Self-pay | Admitting: Adult Health

## 2019-10-05 NOTE — Telephone Encounter (Signed)
I spoke to pt and scheduled an appt on 10/07/19 with [facility].

## 2019-10-05 NOTE — Telephone Encounter (Signed)
Pt wanted to let [facility], know that she is still having ear pain and is having trouble hearing. Pt was giving ear drops 3 weeks ago and she feels the same. Pt would like to know if she can have a referral to a specialist regarding her ear pain. Thanks

## 2019-10-05 NOTE — Telephone Encounter (Signed)
Will need follow up in office

## 2019-10-07 ENCOUNTER — Ambulatory Visit (INDEPENDENT_AMBULATORY_CARE_PROVIDER_SITE_OTHER): Admitting: Adult Health

## 2019-10-07 ENCOUNTER — Encounter: Payer: Self-pay | Admitting: Adult Health

## 2019-10-07 ENCOUNTER — Other Ambulatory Visit: Payer: Self-pay

## 2019-10-07 VITALS — BP 156/90 | Temp 98.0°F | Wt 191.0 lb

## 2019-10-07 DIAGNOSIS — R030 Elevated blood-pressure reading, without diagnosis of hypertension: Secondary | ICD-10-CM

## 2019-10-07 DIAGNOSIS — H60333 Swimmer's ear, bilateral: Secondary | ICD-10-CM

## 2019-10-07 MED ORDER — NEOMYCIN-POLYMYXIN-HC 3.5-10000-1 OT SOLN: 3.0000 [drp] | Freq: Four times a day (QID) | OTIC | 0 refills | Status: AC

## 2019-10-07 NOTE — Progress Notes (Signed)
Subjective:    Patient ID: April Gonzalez, female    DOB: [birth year], 59 y.o.   MRN: [id number]  HPI  59 year old female who  has a past medical history of Allergy, Anemia (05/2012), Fibroids, GERD (gastroesophageal reflux disease), H/O uterine prolapse, History of blood transfusion, Hyperlipidemia, Hypertension, Rectal bleeding, Seizures ([place]), and Thyroid disorder.  She presents to the office today for follow-up regarding bilateral ear pain.  She was originally seen at [facility] urgent care on 09/05/2019 for this complaint.  She had bilateral cerumen impactions which were removed with irrigation and it was noted that she had swollen and tender canals bilaterally.  She was placed on Ciprodex for otitis externa.  Follow-up on 09/10/2019 via virtual visit she reported that she continued to have pain in her both ears.  Pain was better after she use the antibiotic drops but as the day progressed the pain got worse.  At this time she was advised to continue with the eardrops for 5 additional days to complete her course and if no improvement then follow-up  Today on follow-up she reports that she continues to have ear pain bilaterally.  She has continued with the Ciprodex eardrops and reports that the pain is better after she places them then but she continues to have pain later on in the day.  She denies drainage from the ears.     Review of Systems See HPI   Past Medical History:  Diagnosis Date  . Allergy   . Anemia 05/2012   transfusion  . Fibroids    H/O myomectomy  . GERD (gastroesophageal reflux disease)    rare   . H/O uterine prolapse   . History of blood transfusion    x 3   . Hyperlipidemia   . Hypertension   . Rectal bleeding    NOTED AT pv 01-09-17- PT STATES with most BM's for the last 2 years, also hurts   . Seizures ([place])    last seizure age 70 or 10 per pt report-01-09-17 no change in this  . Thyroid disorder     Social History   Socioeconomic History  . Marital status:  Married    Spouse name: Not on file  . Number of children: 2  . Years of education: 73  . Highest education level: Not on file  Occupational History  . Not on file  Tobacco Use  . Smoking status: Never Smoker  . Smokeless tobacco: Never Used  Substance and Sexual Activity  . Alcohol use: Yes    Alcohol/week: 0.0 standard drinks    Comment: rare  . Drug use: No  . Sexual activity: Yes    Birth control/protection: Surgical    Comment: BTL   Other Topics Concern  . Not on file  Social History Narrative   She works for [employer]    Married   Has 2 children    Social Determinants of Radio broadcast assistant Strain:   . Difficulty of Paying Living Expenses:   Food Insecurity:   . Worried About Charity fundraiser in the Last Year:   . Arboriculturist in the Last Year:   Transportation Needs:   . Film/video editor (Medical):   Marland Kitchen Lack of Transportation (Non-Medical):   Physical Activity:   . Days of Exercise per Week:   . Minutes of Exercise per Session:   Stress:   . Feeling of Stress :   Social Connections:   .  Frequency of Communication with Friends and Family:   . Frequency of Social Gatherings with Friends and Family:   . Attends Religious Services:   . Active Member of Clubs or Organizations:   . Attends Archivist Meetings:   Marland Kitchen Marital Status:   Intimate Partner Violence:   . Fear of Current or Ex-Partner:   . Emotionally Abused:   Marland Kitchen Physically Abused:   . Sexually Abused:     Past Surgical History:  Procedure Laterality Date  . ANKLE SURGERY     One plate, seven screws in the right ankle   . BLADDER SUSPENSION N/A 12/10/2012   Procedure: TRANSVAGINAL TAPE (TVT) PROCEDURE;  Surgeon: Delice Lesch, MD;  Location: [facility];  Service: Gynecology;  Laterality: N/A;  . CYSTOCELE REPAIR N/A 12/10/2012   Procedure: ANTERIOR REPAIR (CYSTOCELE);  Surgeon: Eldred Manges, MD;  Location: [facility];  Service: Gynecology;  Laterality: N/A;  .  CYSTOSCOPY N/A 12/10/2012   Procedure: CYSTOSCOPY;  Surgeon: Delice Lesch, MD;  Location: [facility];  Service: Gynecology;  Laterality: N/A;  . EXCISION VAGINAL CYST    . MYOMECTOMY  25YRS AGO   HYSTEROSCOPIC  . TUBAL LIGATION  10/10/1991  . VAGINAL HYSTERECTOMY N/A 12/10/2012   Procedure: Total Vaginal Hysterectomy;  Surgeon: Eldred Manges, MD;  Location: [facility];  Service: Gynecology;  Laterality: N/A;    Family History  Problem Relation Age of Onset  . Heart failure Father   . Diabetes Mother   . High blood pressure Mother   . Arthritis Mother   . Breast cancer Maternal Aunt        in her 60's   . Breast cancer Cousin   . Thyroid disease Sister   . Colon cancer Neg Hx   . Colon polyps Neg Hx   . Esophageal cancer Neg Hx   . Rectal cancer Neg Hx   . Stomach cancer Neg Hx     Allergies  Allergen Reactions  . Methimazole Anaphylaxis    EMS had to be called and a 7-day's stay in the hospital resulted  . Nitroglycerin Other (See Comments)    Sweating   . Other Itching and Other (See Comments)    Seasonal allergies & dog hair = runny nose also  . Tetracycline Hcl Hypertension  . Promethazine Nausea Only    Made the patient feel like she was going to vomit and it "spaced" her out  . Shrimp [Shellfish Allergy] Itching  . Latex Rash  . Penicillins Other (See Comments)    Has patient had a PCN reaction causing immediate rash, facial/tongue/throat swelling, SOB or lightheadedness with hypotension: Unk Has patient had a PCN reaction causing severe rash involving mucus membranes or skin necrosis: Unk Has patient had a PCN reaction that required hospitalization: Unk Has patient had a PCN reaction occurring within the last 10 years: Yes If all of the above answers are "NO", then may proceed with Cephalosporin use.   . Tape Rash  . Terbutaline Other (See Comments)    Reaction not recalled (??)    Current Outpatient Medications on File Prior to Visit  Medication Sig Dispense  Refill  . acetaminophen (TYLENOL) 500 MG tablet Take 500-1,000 mg by mouth every 6 (six) hours as needed for mild pain or headache.    . valACYclovir (VALTREX) 500 MG tablet Take 500 mg by mouth 2 (two) times daily.    Marland Kitchen EPINEPHrine (EPIPEN 2-PAK) 0.3 mg/0.3 mL IJ SOAJ injection Inject 0.3 mLs (  0.3 mg total) into the muscle once. If you develop difficulty breathing (Patient not taking: Reported on 09/06/2019) 2 Device 0  . gabapentin (NEURONTIN) 300 MG capsule Take 1 capsule (300 mg total) by mouth 2 (two) times daily. (Patient not taking: Reported on 09/10/2019) 60 capsule 5   No current facility-administered medications on file prior to visit.    BP (!) 156/90   Temp 98 F (36.7 C)   Wt 191 lb (86.6 kg)   LMP 12/03/2012   BMI 33.83 kg/m       Objective:   Physical Exam Vitals and nursing note reviewed.  Constitutional:      Appearance: Normal appearance.  HENT:     Right Ear: Hearing normal. Swelling and tenderness present. No drainage. There is no impacted cerumen.     Left Ear: Hearing normal. Swelling and tenderness present. No drainage. There is no impacted cerumen.     Ears:     Comments: She continues to have swelling noted to bilateral ear canals as well as tenderness when otoscope is inserted Neurological:     Mental Status: She is alert.       Assessment & Plan:  1. Acute swimmer's ear of both sides - Will switch antibiotics to Cortisporin. Follow up if no improvement in the next 10 days and will refer to ENT  - neomycin-polymyxin-hydrocortisone (CORTISPORIN) OTIC solution; Place 3 drops into both ears 4 (four) times daily for 10 days.  Dispense: 10 mL; Refill: 0  2. Elevated blood pressure reading -We will have her monitor her blood pressures at home over the next week.  She will follow-up with me via MyChart with the results  [name], NP

## 2019-10-13 ENCOUNTER — Ambulatory Visit: Admitting: Family Medicine

## 2019-10-13 ENCOUNTER — Other Ambulatory Visit: Payer: Self-pay

## 2019-10-14 ENCOUNTER — Encounter: Payer: Self-pay | Admitting: Family Medicine

## 2019-10-14 ENCOUNTER — Telehealth (INDEPENDENT_AMBULATORY_CARE_PROVIDER_SITE_OTHER): Admitting: Family Medicine

## 2019-10-14 DIAGNOSIS — I10 Essential (primary) hypertension: Secondary | ICD-10-CM

## 2019-10-14 DIAGNOSIS — H9203 Otalgia, bilateral: Secondary | ICD-10-CM | POA: Diagnosis not present

## 2019-10-14 DIAGNOSIS — H5713 Ocular pain, bilateral: Secondary | ICD-10-CM | POA: Diagnosis not present

## 2019-10-14 NOTE — Progress Notes (Signed)
Virtual Visit via Video Note  I connected with April Gonzalez  on 10/14/19 at  3:20 PM EDT by a video enabled telemedicine application and verified that I am speaking with the correct person using two identifiers.  Location patient: home, [place] Location provider:work or home office Persons participating in the virtual visit: patient, provider   I discussed the limitations of evaluation and management by telemedicine and the availability of in person appointments. The patient expressed understanding and agreed to proceed.   HPI:  Acute visit for eye pain and ear pain: -ear pain started last month, saw PCP recently, eye pain started in the last week -bilateral pain in the ears and the eyeballs, she noticed the pain in the eyes when rubbing her eyes in the morning - only notices eye pain when she rubs her eyes or squints -has occ nasal congestion, but not much and is chronic -denies vision changes, hearing loss, fevers, nausea, HA, vomiting, cough, malaise -on ear drops for the ar pain for ? bilat otitis externa, but she does not feel that it is helping -wears glasses -BP was high at recent PCP office visit -BP today is 147/85 on home cuff, she does not want to take medications for this - no CP, SOB, HA, etc  ROS: See pertinent positives and negatives per HPI.  Past Medical History:  Diagnosis Date  . Allergy   . Anemia 05/2012   transfusion  . Fibroids    H/O myomectomy  . GERD (gastroesophageal reflux disease)    rare   . H/O uterine prolapse   . History of blood transfusion    x 3   . Hyperlipidemia   . Hypertension   . Rectal bleeding    NOTED AT pv 01-09-17- PT STATES with most BM's for the last 2 years, also hurts   . Seizures ([place])    last seizure age 33 or 10 per pt report-01-09-17 no change in this  . Thyroid disorder     Past Surgical History:  Procedure Laterality Date  . ANKLE SURGERY     One plate, seven screws in the right ankle   . BLADDER SUSPENSION N/A 12/10/2012    Procedure: TRANSVAGINAL TAPE (TVT) PROCEDURE;  Surgeon: Delice Lesch, MD;  Location: [facility];  Service: Gynecology;  Laterality: N/A;  . CYSTOCELE REPAIR N/A 12/10/2012   Procedure: ANTERIOR REPAIR (CYSTOCELE);  Surgeon: Eldred Manges, MD;  Location: [facility];  Service: Gynecology;  Laterality: N/A;  . CYSTOSCOPY N/A 12/10/2012   Procedure: CYSTOSCOPY;  Surgeon: Delice Lesch, MD;  Location: [facility];  Service: Gynecology;  Laterality: N/A;  . EXCISION VAGINAL CYST    . MYOMECTOMY  96YRS AGO   HYSTEROSCOPIC  . TUBAL LIGATION  10/10/1991  . VAGINAL HYSTERECTOMY N/A 12/10/2012   Procedure: Total Vaginal Hysterectomy;  Surgeon: Eldred Manges, MD;  Location: [facility];  Service: Gynecology;  Laterality: N/A;    Family History  Problem Relation Age of Onset  . Heart failure Father   . Diabetes Mother   . High blood pressure Mother   . Arthritis Mother   . Breast cancer Maternal Aunt        in her 67's   . Breast cancer Cousin   . Thyroid disease Sister   . Colon cancer Neg Hx   . Colon polyps Neg Hx   . Esophageal cancer Neg Hx   . Rectal cancer Neg Hx   . Stomach cancer Neg Hx     SOCIAL  HX: see hpi   Current Outpatient Medications:  .  acetaminophen (TYLENOL) 500 MG tablet, Take 500-1,000 mg by mouth every 6 (six) hours as needed for mild pain or headache., Disp: , Rfl:  .  EPINEPHrine (EPIPEN 2-PAK) 0.3 mg/0.3 mL IJ SOAJ injection, Inject 0.3 mLs (0.3 mg total) into the muscle once. If you develop difficulty breathing, Disp: 2 Device, Rfl: 0 .  gabapentin (NEURONTIN) 300 MG capsule, Take 1 capsule (300 mg total) by mouth 2 (two) times daily., Disp: 60 capsule, Rfl: 5 .  neomycin-polymyxin-hydrocortisone (CORTISPORIN) OTIC solution, Place 3 drops into both ears 4 (four) times daily for 10 days., Disp: 10 mL, Rfl: 0 .  valACYclovir (VALTREX) 500 MG tablet, Take 500 mg by mouth 2 (two) times daily., Disp: , Rfl:   EXAM:  VITALS per patient if applicable:  GENERAL: alert,  oriented, appears well and in no acute distress  HEENT: atraumatic, EOMI, conjunttiva clear, pt reports vision is intact, no obvious abnormalities on inspection of external nose and ears  NECK: normal movements of the head and neck  LUNGS: on inspection no signs of respiratory distress, breathing rate appears normal, no obvious gross SOB, gasping or wheezing  CV: no obvious cyanosis  MS: moves all visible extremities without noticeable abnormality  PSYCH/NEURO: pleasant and cooperative, no obvious depression or anxiety, speech and thought processing grossly intact  ASSESSMENT AND PLAN:  Discussed the following assessment and plan:  Pain of both eyes  Otalgia of both ears  Hypertension, unspecified type  -we discussed possible serious and likely etiologies, options for evaluation and workup, limitations of telemedicine visit vs in person visit, treatment, treatment risks and precautions. Pt prefers to treat via telemedicine empirically rather then risking or undertaking an in person visit at this moment. Tricky case and advised further evaluation is needed. First of all, advised her BP is elevated and needs treatment. I have seen some patients experience eye discomfort and pressure I the ears with elevated BP. She does not wish to take medications. Advised follow up with PCP and discussed non-pharmacological interventions, diet, weight reduction, exercise. She agrees to explore the DASH diet. She needs to see an opthomologist to evaluate her eyes, reports she has an opthomologist and will get an appt. Per PCP notes was to see ENT if ear issues did not resolve, advised [facility] would be a good option and she agreed to call. Sent message to schedulers for PCP follow up. Patient agrees to seek prompt in person care in the interim if worsening, new symptoms arise, or if is not improving with treatment.   I discussed the assessment and treatment plan with the patient. The patient was provided an  opportunity to ask questions and all were answered. The patient agreed with the plan and demonstrated an understanding of the instructions.   The patient was advised to call back or seek an in-person evaluation if the symptoms worsen or if the condition fails to improve as anticipated.   Lucretia Kern, DO

## 2019-10-14 NOTE — Patient Instructions (Signed)
See any eye doctor for evaluation for eye pain.   See the Ear, nose and throat specialist about the ears if the issues persist.  Follow up with your primary provider regarding the blood pressure. Bring your cuff and a log of your daily blood pressure to that visit.  In the interim can try the diet modifications we discussed - check out the DASH diet. Reduction and stress and weight and regular exercise can also help to treat high blood pressure.  I hope you are feeling better soon! Seek care promptly if your symptoms worsen, new concerns arise or you are not improving with treatment.

## 2019-10-20 ENCOUNTER — Ambulatory Visit: Admitting: Adult Health

## 2019-11-09 ENCOUNTER — Other Ambulatory Visit: Payer: Self-pay | Admitting: Neurology

## 2019-11-09 DIAGNOSIS — M542 Cervicalgia: Secondary | ICD-10-CM

## 2019-12-03 ENCOUNTER — Emergency Department (HOSPITAL_COMMUNITY)

## 2019-12-03 ENCOUNTER — Emergency Department (HOSPITAL_COMMUNITY)
Admission: EM | Admit: 2019-12-03 | Discharge: 2019-12-03 | Disposition: A | Discharge status: Home / Self Care | Attending: Emergency Medicine | Admitting: Emergency Medicine

## 2019-12-03 ENCOUNTER — Other Ambulatory Visit: Payer: Self-pay

## 2019-12-03 ENCOUNTER — Encounter (HOSPITAL_COMMUNITY): Payer: Self-pay | Admitting: Emergency Medicine

## 2019-12-03 DIAGNOSIS — R079 Chest pain, unspecified: Secondary | ICD-10-CM | POA: Insufficient documentation

## 2019-12-03 DIAGNOSIS — Z5321 Procedure and treatment not carried out due to patient leaving prior to being seen by health care provider: Secondary | ICD-10-CM | POA: Insufficient documentation

## 2019-12-03 LAB — BASIC METABOLIC PANEL
Anion gap: 9 (ref 5–15)
BUN: 11 mg/dL (ref 6–20)
CO2: 22 mmol/L (ref 22–32)
Calcium: 9.2 mg/dL (ref 8.9–10.3)
Chloride: 106 mmol/L (ref 98–111)
Creatinine, Ser: 0.93 mg/dL (ref 0.44–1.00)
GFR calc Af Amer: >60 mL/min (ref >60)
GFR calc non Af Amer: >60 mL/min (ref >60)
Glucose, Bld: 94 mg/dL (ref 70–99)
Potassium: 3.8 mmol/L (ref 3.5–5.1)
Sodium: 137 mmol/L (ref 135–145)

## 2019-12-03 LAB — CBC
HCT: 45.7 % (ref 36.0–46.0)
Hemoglobin: 14.8 g/dL (ref 12.0–15.0)
MCH: 28.7 pg (ref 26.0–34.0)
MCHC: 32.4 g/dL (ref 30.0–36.0)
MCV: 88.7 fL (ref 80.0–100.0)
Platelets: 228 10*3/uL (ref 150–400)
RBC: 5.15 MIL/uL — ABNORMAL HIGH (ref 3.87–5.11)
RDW: 11.9 % (ref 11.5–15.5)
WBC: 7.2 10*3/uL (ref 4.0–10.5)
nRBC: 0 % (ref 0.0–0.2)

## 2019-12-03 LAB — I-STAT BETA HCG BLOOD, ED (MC, WL, AP ONLY): I-stat hCG, quantitative: <5 m[IU]/mL (ref <5)

## 2019-12-03 LAB — TROPONIN I (HIGH SENSITIVITY): Troponin I (High Sensitivity): 2 ng/L (ref <18)

## 2019-12-03 MED ORDER — SODIUM CHLORIDE 0.9% FLUSH: 3.0000 mL | Freq: Once | INTRAVENOUS | Status: DC

## 2019-12-03 NOTE — ED Triage Notes (Signed)
Pt c/o cp for the past 3 days getting worse.

## 2019-12-03 NOTE — ED Notes (Signed)
Pt checked out AMA due to wait time

## 2019-12-06 ENCOUNTER — Telehealth: Payer: Self-pay | Admitting: *Deleted

## 2019-12-06 NOTE — Telephone Encounter (Signed)
Date/Time April Gonzalez Time): 12/03/2019 3:10:22 PM Patient called after hours triage nurse line: Caller states she is having chest pain and pressure. She states it is like an elephant it sitting on her chest.Patient was advised to go to ED. Patient seen in ED on 12/03/2019.

## 2019-12-07 ENCOUNTER — Encounter: Payer: Self-pay | Admitting: Adult Health

## 2019-12-07 ENCOUNTER — Telehealth (INDEPENDENT_AMBULATORY_CARE_PROVIDER_SITE_OTHER): Admitting: Adult Health

## 2019-12-07 VITALS — BP 140/80 | Wt 187.0 lb

## 2019-12-07 DIAGNOSIS — R079 Chest pain, unspecified: Secondary | ICD-10-CM

## 2019-12-07 MED ORDER — EPINEPHRINE 0.3 MG/0.3ML IJ SOAJ: 0.3000 mg | Freq: Once | INTRAMUSCULAR | 0 refills | Status: DC

## 2019-12-07 NOTE — Progress Notes (Signed)
Virtual Visit via Video Note  I connected with April Gonzalez  on 12/07/19 at  1:30 PM EDT by a video enabled telemedicine application and verified that I am speaking with the correct person using two identifiers.  Location patient: home Location provider:work or home office Persons participating in the virtual visit: patient, provider  I discussed the limitations of evaluation and management by telemedicine and the availability of in person appointments. The patient expressed understanding and agreed to proceed.   HPI: 59 year old female who is being evaluated today for follow-up after an ER visit which she left AMA.  She was seen on 12/03/2019 in the ER for chest pain.  She reports that the pain was present for 1 week prior to coming to the ER.  She thought that maybe she had a pulled muscle from laying new wood floors in her house but when the pain did not go away and it actually got worse to the point where "it felt like something was sitting on my chest" she went to the ER.  Blood work including CBC, BMP, troponin were negative.  Her EKG showed normal sinus rhythm and chest x-ray showed no active cardiopulmonary etiology.  He decided to leave the emergency room because she was told it was good to be "another 8 to 10 hours of waiting I did not want to be a all around the people that had Covid".    No longer has any chest discomfort but does have mild tenderness in her upper back to bruises from moving objects and laying down the wood floor.  Overall she feels good and has no acute complaints   ROS: See pertinent positives and negatives per HPI.  Past Medical History:  Diagnosis Date   Allergy    Anemia 05/2012   transfusion   Fibroids    H/O myomectomy   GERD (gastroesophageal reflux disease)    rare    H/O uterine prolapse    History of blood transfusion    x 3    Hyperlipidemia    Hypertension    Rectal bleeding    NOTED AT pv 01-09-17- PT STATES with most BM's for the last  2 years, also hurts    Seizures ([place])    last seizure age 11 or 10 per pt report-01-09-17 no change in this   Thyroid disorder     Past Surgical History:  Procedure Laterality Date   ANKLE SURGERY     One plate, seven screws in the right ankle    BLADDER SUSPENSION N/A 12/10/2012   Procedure: TRANSVAGINAL TAPE (TVT) PROCEDURE;  Surgeon: Delice Lesch, MD;  Location: [facility];  Service: Gynecology;  Laterality: N/A;   CYSTOCELE REPAIR N/A 12/10/2012   Procedure: ANTERIOR REPAIR (CYSTOCELE);  Surgeon: Eldred Manges, MD;  Location: [facility];  Service: Gynecology;  Laterality: N/A;   CYSTOSCOPY N/A 12/10/2012   Procedure: CYSTOSCOPY;  Surgeon: Delice Lesch, MD;  Location: [facility];  Service: Gynecology;  Laterality: N/A;   EXCISION VAGINAL CYST     MYOMECTOMY  65YRS AGO   HYSTEROSCOPIC   TUBAL LIGATION  10/10/1991   VAGINAL HYSTERECTOMY N/A 12/10/2012   Procedure: Total Vaginal Hysterectomy;  Surgeon: Eldred Manges, MD;  Location: [facility];  Service: Gynecology;  Laterality: N/A;    Family History  Problem Relation Age of Onset   Heart failure Father    Diabetes Mother    High blood pressure Mother    Arthritis Mother    Breast cancer  Maternal Aunt        in her 31's    Breast cancer Cousin    Thyroid disease Sister    Colon cancer Neg Hx    Colon polyps Neg Hx    Esophageal cancer Neg Hx    Rectal cancer Neg Hx    Stomach cancer Neg Hx        Current Outpatient Medications:    acetaminophen (TYLENOL) 500 MG tablet, Take 500-1,000 mg by mouth every 6 (six) hours as needed for mild pain or headache., Disp: , Rfl:    EPINEPHrine (EPIPEN 2-PAK) 0.3 mg/0.3 mL IJ SOAJ injection, Inject 0.3 mLs (0.3 mg total) into the muscle once. If you develop difficulty breathing, Disp: 2 Device, Rfl: 0   valACYclovir (VALTREX) 500 MG tablet, Take 500 mg by mouth 2 (two) times daily., Disp: , Rfl:   EXAM:  VITALS per patient if applicable:  GENERAL: alert, oriented,  appears well and in no acute distress  HEENT: atraumatic, conjunttiva clear, no obvious abnormalities on inspection of external nose and ears  NECK: normal movements of the head and neck  LUNGS: on inspection no signs of respiratory distress, breathing rate appears normal, no obvious gross SOB, gasping or wheezing  CV: no obvious cyanosis  MS: moves all visible extremities without noticeable abnormality  PSYCH/NEURO: pleasant and cooperative, no obvious depression or anxiety, speech and thought processing grossly intact  ASSESSMENT AND PLAN:  Discussed the following assessment and plan:  1. Chest pain, unspecified type -Likely MSK in nature.  Advise if chest pain returns and go to the emergency room or follow-up in the clinic    I discussed the assessment and treatment plan with the patient. The patient was provided an opportunity to ask questions and all were answered. The patient agreed with the plan and demonstrated an understanding of the instructions.   The patient was advised to call back or seek an in-person evaluation if the symptoms worsen or if the condition fails to improve as anticipated.   [name], NP

## 2019-12-07 NOTE — Telephone Encounter (Signed)
Pt left hospital AMA.  Has a virtual visit scheduled with Tommi Rumps today at 1:30.

## 2019-12-23 ENCOUNTER — Other Ambulatory Visit: Payer: Self-pay

## 2019-12-23 ENCOUNTER — Ambulatory Visit (INDEPENDENT_AMBULATORY_CARE_PROVIDER_SITE_OTHER): Admitting: Adult Health

## 2019-12-23 ENCOUNTER — Encounter: Payer: Self-pay | Admitting: Adult Health

## 2019-12-23 VITALS — BP 124/80 | HR 92 | Temp 98.4°F | Ht 63.0 in | Wt 189.0 lb

## 2019-12-23 DIAGNOSIS — H9203 Otalgia, bilateral: Secondary | ICD-10-CM

## 2019-12-23 NOTE — Progress Notes (Signed)
Subjective:    Patient ID: April Gonzalez, female    DOB: 1961-04-30, 59 y.o.   MRN: [id number]  HPI 59 year old female who  has a past medical history of Allergy, Anemia (05/2012), Fibroids, GERD (gastroesophageal reflux disease), H/O uterine prolapse, History of blood transfusion, Hyperlipidemia, Hypertension, Rectal bleeding, Seizures ([place]), and Thyroid disorder.  She presents to the office today for continued bilateral ear pain. She has been seen multiple times over the last 2 months for this issue.  She did have an appointment with [facility] but had to cancel due to helping her daughter and grandchildren.  She never rescheduled this visit.  She reports that she continues to have pain in bilateral ears that is pretty constant.  She denies drainage, fevers, chills, or feeling acutely ill.  She also has radiating pain down both sides of her throat but has not noticed any swelling, trouble swallowing, or difficulty breathing.  Not had any loss of hearing   Review of Systems See HPI   Past Medical History:  Diagnosis Date   Allergy    Anemia 05/2012   transfusion   Fibroids    H/O myomectomy   GERD (gastroesophageal reflux disease)    rare    H/O uterine prolapse    History of blood transfusion    x 3    Hyperlipidemia    Hypertension    Rectal bleeding    NOTED AT pv 01-09-17- PT STATES with most BM's for the last 2 years, also hurts    Seizures ([place])    last seizure age 82 or 10 per pt report-01-09-17 no change in this   Thyroid disorder     Social History   Socioeconomic History   Marital status: Married    Spouse name: Not on file   Number of children: 2   Years of education: 15   Highest education level: Not on file  Occupational History   Not on file  Tobacco Use   Smoking status: Never Smoker   Smokeless tobacco: Never Used  Substance and Sexual Activity   Alcohol use: Yes    Alcohol/week: 0.0 standard drinks    Comment: rare   Drug use: No     Sexual activity: Yes    Birth control/protection: Surgical    Comment: BTL   Other Topics Concern   Not on file  Social History Narrative   She works for [employer]    Married   Has 2 children    Social Determinants of Radio broadcast assistant Strain:    Difficulty of Paying Living Expenses: Not on file  Food Insecurity:    Worried About Charity fundraiser in the Last Year: Not on file   [employer] of Food in the Last Year: Not on file  Transportation Needs:    Lack of Transportation (Medical): Not on file   Lack of Transportation (Non-Medical): Not on file  Physical Activity:    Days of Exercise per Week: Not on file   Minutes of Exercise per Session: Not on file  Stress:    Feeling of Stress : Not on file  Social Connections:    Frequency of Communication with Friends and Family: Not on file   Frequency of Social Gatherings with Friends and Family: Not on file   Attends Religious Services: Not on file   Active Member of Clubs or Organizations: Not on file   Attends Archivist Meetings: Not on file  Marital Status: Not on file  Intimate Partner Violence:    Fear of Current or Ex-Partner: Not on file   Emotionally Abused: Not on file   Physically Abused: Not on file   Sexually Abused: Not on file    Past Surgical History:  Procedure Laterality Date   ANKLE SURGERY     One plate, seven screws in the right ankle    BLADDER SUSPENSION N/A 12/10/2012   Procedure: TRANSVAGINAL TAPE (TVT) PROCEDURE;  Surgeon: Delice Lesch, MD;  Location: [facility];  Service: Gynecology;  Laterality: N/A;   CYSTOCELE REPAIR N/A 12/10/2012   Procedure: ANTERIOR REPAIR (CYSTOCELE);  Surgeon: Eldred Manges, MD;  Location: [facility];  Service: Gynecology;  Laterality: N/A;   CYSTOSCOPY N/A 12/10/2012   Procedure: CYSTOSCOPY;  Surgeon: Delice Lesch, MD;  Location: [facility];  Service: Gynecology;  Laterality: N/A;   EXCISION VAGINAL CYST     MYOMECTOMY   47YRS AGO   HYSTEROSCOPIC   TUBAL LIGATION  10/10/1991   VAGINAL HYSTERECTOMY N/A 12/10/2012   Procedure: Total Vaginal Hysterectomy;  Surgeon: Eldred Manges, MD;  Location: [facility];  Service: Gynecology;  Laterality: N/A;    Family History  Problem Relation Age of Onset   Heart failure Father    Diabetes Mother    High blood pressure Mother    Arthritis Mother    Breast cancer Maternal Aunt        in her 73's    Breast cancer Cousin    Thyroid disease Sister    Colon cancer Neg Hx    Colon polyps Neg Hx    Esophageal cancer Neg Hx    Rectal cancer Neg Hx    Stomach cancer Neg Hx     Allergies  Allergen Reactions   Methimazole Anaphylaxis    EMS had to be called and a 7-day's stay in the hospital resulted   Nitroglycerin Other (See Comments)    Sweating    Other Itching and Other (See Comments)    Seasonal allergies & dog hair = runny nose also   Tetracycline Hcl Hypertension   Promethazine Nausea Only    Made the patient feel like she was going to vomit and it "spaced" her out   Shrimp [Shellfish Allergy] Itching   Latex Rash   Penicillins Other (See Comments)    Has patient had a PCN reaction causing immediate rash, facial/tongue/throat swelling, SOB or lightheadedness with hypotension: Unk Has patient had a PCN reaction causing severe rash involving mucus membranes or skin necrosis: Unk Has patient had a PCN reaction that required hospitalization: Unk Has patient had a PCN reaction occurring within the last 10 years: Yes If all of the above answers are "NO", then may proceed with Cephalosporin use.    Tape Rash   Terbutaline Other (See Comments)    Reaction not recalled (??)    Current Outpatient Medications on File Prior to Visit  Medication Sig Dispense Refill   acetaminophen (TYLENOL) 500 MG tablet Take 500-1,000 mg by mouth every 6 (six) hours as needed for mild pain or headache.     valACYclovir (VALTREX) 500 MG tablet Take 500 mg  by mouth 2 (two) times daily.     EPINEPHrine (EPIPEN 2-PAK) 0.3 mg/0.3 mL IJ SOAJ injection Inject 0.3 mLs (0.3 mg total) into the muscle once for 1 dose. If you develop difficulty breathing 0.3 mL 0   No current facility-administered medications on file prior to visit.    BP 124/80  Pulse 92    Temp 98.4 F (36.9 C) (Other (Comment))    Ht 5\' 3"  (1.6 m)    Wt 189 lb (85.7 kg)    LMP 12/03/2012    SpO2 96%    BMI 33.48 kg/m       Objective:   Physical Exam Vitals and nursing note reviewed.  Constitutional:      Appearance: Normal appearance.  HENT:     Right Ear: Tympanic membrane, ear canal and external ear normal. There is no impacted cerumen.     Left Ear: Tympanic membrane, ear canal and external ear normal. There is no impacted cerumen.     Mouth/Throat:     Mouth: Mucous membranes are moist.     Pharynx: Oropharynx is clear. No oropharyngeal exudate or posterior oropharyngeal erythema.  Musculoskeletal:     Cervical back: Tenderness (under jaw bilaterally. No swollen lymph nodes) present.  Skin:    General: Skin is warm and dry.     Capillary Refill: Capillary refill takes less than 2 seconds.  Neurological:     General: No focal deficit present.     Mental Status: She is alert and oriented to person, place, and time.  Psychiatric:        Mood and Affect: Mood normal.        Behavior: Behavior normal.        Thought Content: Thought content normal.        Judgment: Judgment normal.       Assessment & Plan:  1. Otalgia of both ears -Benign exam.  No signs of infection noted.  Possible small amount of fluid behind her right TM?.  I did advise her to follow-up with ear nose and throat for further evaluation since her symptoms have been longstanding.  She will call Dr. Janeice Robinson office and I will refer her there as well - Ambulatory referral to ENT  [name], NP

## 2019-12-31 ENCOUNTER — Other Ambulatory Visit: Payer: Self-pay | Admitting: Adult Health

## 2019-12-31 ENCOUNTER — Telehealth: Payer: Self-pay | Admitting: Adult Health

## 2019-12-31 MED ORDER — NAPROXEN 500 MG PO TABS
500.0000 mg | ORAL_TABLET | Freq: Two times a day (BID) | ORAL | 1 refills | Status: DC | PRN
Start: 2019-12-31 — End: 2021-01-24

## 2019-12-31 NOTE — Telephone Encounter (Signed)
Pt call and stated the ENT couldn't see her until 12/13/19 and her ear pain has gotten  Worse and she need something for the pain.

## 2019-12-31 NOTE — Telephone Encounter (Signed)
Please advise 

## 2019-12-31 NOTE — Telephone Encounter (Signed)
Naproxen sent in

## 2020-01-19 ENCOUNTER — Ambulatory Visit: Admitting: Neurology
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
## 2020-01-21 DIAGNOSIS — H9203 Otalgia, bilateral: Secondary | ICD-10-CM | POA: Insufficient documentation

## 2020-01-21 DIAGNOSIS — M26623 Arthralgia of bilateral temporomandibular joint: Secondary | ICD-10-CM | POA: Insufficient documentation

## 2020-01-28 ENCOUNTER — Other Ambulatory Visit

## 2020-01-28 DIAGNOSIS — Z20822 Contact with and (suspected) exposure to covid-19: Secondary | ICD-10-CM

## 2020-01-29 LAB — NOVEL CORONAVIRUS, NAA: SARS-CoV-2, NAA: NOT DETECTED

## 2020-01-29 LAB — SARS-COV-2, NAA 2 DAY TAT

## 2020-02-19 ENCOUNTER — Ambulatory Visit: Attending: Internal Medicine

## 2020-02-19 DIAGNOSIS — Z23 Encounter for immunization: Secondary | ICD-10-CM

## 2020-02-19 NOTE — Progress Notes (Signed)
   Covid-19 Vaccination Clinic  Name:  April Gonzalez    MRN: [id number] DOB: 06-29-1960  02/19/2020  April Gonzalez was observed post Covid-19 immunization for 15 minutes without incident. She was provided with Vaccine Information Sheet and instruction to access the V-Safe system.   April Gonzalez was instructed to call 911 with any severe reactions post vaccine: Marland Kitchen Difficulty breathing  . Swelling of face and throat  . A fast heartbeat  . A bad rash all over body  . Dizziness and weakness

## 2020-03-17 ENCOUNTER — Telehealth: Admitting: Adult Health

## 2020-06-17 ENCOUNTER — Other Ambulatory Visit: Payer: Self-pay

## 2020-06-17 ENCOUNTER — Emergency Department (HOSPITAL_BASED_OUTPATIENT_CLINIC_OR_DEPARTMENT_OTHER)

## 2020-06-17 ENCOUNTER — Emergency Department (HOSPITAL_BASED_OUTPATIENT_CLINIC_OR_DEPARTMENT_OTHER)
Admission: EM | Admit: 2020-06-17 | Discharge: 2020-06-17 | Disposition: A | Discharge status: Home / Self Care | Attending: Emergency Medicine | Admitting: Emergency Medicine

## 2020-06-17 ENCOUNTER — Encounter (HOSPITAL_BASED_OUTPATIENT_CLINIC_OR_DEPARTMENT_OTHER): Payer: Self-pay | Admitting: Emergency Medicine

## 2020-06-17 DIAGNOSIS — E039 Hypothyroidism, unspecified: Secondary | ICD-10-CM | POA: Diagnosis not present

## 2020-06-17 DIAGNOSIS — R519 Headache, unspecified: Secondary | ICD-10-CM | POA: Insufficient documentation

## 2020-06-17 DIAGNOSIS — I10 Essential (primary) hypertension: Secondary | ICD-10-CM | POA: Diagnosis not present

## 2020-06-17 DIAGNOSIS — R202 Paresthesia of skin: Secondary | ICD-10-CM | POA: Insufficient documentation

## 2020-06-17 DIAGNOSIS — Z91040 Latex allergy status: Secondary | ICD-10-CM | POA: Diagnosis not present

## 2020-06-17 LAB — COMPREHENSIVE METABOLIC PANEL
ALT: 22 U/L (ref 0–44)
AST: 17 U/L (ref 15–41)
Albumin: 4.4 g/dL (ref 3.5–5.0)
Alkaline Phosphatase: 94 U/L (ref 38–126)
Anion gap: 9 (ref 5–15)
BUN: 13 mg/dL (ref 6–20)
CO2: 27 mmol/L (ref 22–32)
Calcium: 9.3 mg/dL (ref 8.9–10.3)
Chloride: 100 mmol/L (ref 98–111)
Creatinine, Ser: 0.71 mg/dL (ref 0.44–1.00)
GFR, Estimated: >60 mL/min (ref >60)
Glucose, Bld: 99 mg/dL (ref 70–99)
Potassium: 4.3 mmol/L (ref 3.5–5.1)
Sodium: 136 mmol/L (ref 135–145)
Total Bilirubin: 0.3 mg/dL (ref 0.3–1.2)
Total Protein: 8.2 g/dL — ABNORMAL HIGH (ref 6.5–8.1)

## 2020-06-17 LAB — CBC WITH DIFFERENTIAL/PLATELET
Abs Immature Granulocytes: 0.01 10*3/uL (ref 0.00–0.07)
Basophils Absolute: 0.1 10*3/uL (ref 0.0–0.1)
Basophils Relative: 1 %
Eosinophils Absolute: 0.2 10*3/uL (ref 0.0–0.5)
Eosinophils Relative: 3 %
HCT: 44 % (ref 36.0–46.0)
Hemoglobin: 14.7 g/dL (ref 12.0–15.0)
Immature Granulocytes: 0 %
Lymphocytes Relative: 32 %
Lymphs Abs: 1.9 10*3/uL (ref 0.7–4.0)
MCH: 29.5 pg (ref 26.0–34.0)
MCHC: 33.4 g/dL (ref 30.0–36.0)
MCV: 88.2 fL (ref 80.0–100.0)
Monocytes Absolute: 0.5 10*3/uL (ref 0.1–1.0)
Monocytes Relative: 8 %
Neutro Abs: 3.3 10*3/uL (ref 1.7–7.7)
Neutrophils Relative %: 56 %
Platelets: 209 10*3/uL (ref 150–400)
RBC: 4.99 MIL/uL (ref 3.87–5.11)
RDW: 12.1 % (ref 11.5–15.5)
WBC: 5.9 10*3/uL (ref 4.0–10.5)
nRBC: 0 % (ref 0.0–0.2)

## 2020-06-17 MED ORDER — KETOROLAC TROMETHAMINE 30 MG/ML IJ SOLN
30.0000 mg | Freq: Once | INTRAMUSCULAR | Status: AC
  Administered 2020-06-17: 30 mg via INTRAVENOUS
  Filled 2020-06-17: qty 1
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
## 2020-06-17 NOTE — ED Notes (Signed)
Pt ambulatory with steady gait to restroom, will provide urine specimen
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
## 2020-06-19 ENCOUNTER — Telehealth: Payer: Self-pay | Admitting: Neurology

## 2020-06-19 ENCOUNTER — Other Ambulatory Visit: Payer: Self-pay

## 2020-06-19 NOTE — Telephone Encounter (Signed)
I called pt and she is complaining of constant head pain for years. (vice grip constant pain).  Has taken tylenol no relief, she could not recall  What gabapentin did her her.  Last seen 09-2019.  Still with R and L hand weakness.  Had Aneta/EMG 10-2019.  Nortriptyline did not help.  She was seen in ED 06-17-20 given toradol lasted for 10 minutes.  I did not have appt for her soon, placed on list to call for cancellation. Pt ok with this.  She has appt with pcp Beaulah Dinning tomorrow at 0900.

## 2020-06-19 NOTE — Telephone Encounter (Signed)
@  9:42 pt left vm about constant pain in her head, please call.

## 2020-06-20 ENCOUNTER — Ambulatory Visit (INDEPENDENT_AMBULATORY_CARE_PROVIDER_SITE_OTHER): Admitting: Family Medicine

## 2020-06-20 ENCOUNTER — Encounter: Payer: Self-pay | Admitting: Family Medicine

## 2020-06-20 VITALS — BP 130/80 | HR 77 | Temp 97.9°F | Resp 16 | Ht 63.0 in | Wt 195.3 lb

## 2020-06-20 DIAGNOSIS — G44229 Chronic tension-type headache, not intractable: Secondary | ICD-10-CM

## 2020-06-20 DIAGNOSIS — M542 Cervicalgia: Secondary | ICD-10-CM

## 2020-06-20 DIAGNOSIS — M79604 Pain in right leg: Secondary | ICD-10-CM

## 2020-06-20 DIAGNOSIS — R21 Rash and other nonspecific skin eruption: Secondary | ICD-10-CM

## 2020-06-20 DIAGNOSIS — E039 Hypothyroidism, unspecified: Secondary | ICD-10-CM | POA: Diagnosis not present

## 2020-06-20 DIAGNOSIS — M79605 Pain in left leg: Secondary | ICD-10-CM

## 2020-06-20 LAB — TSH: TSH: 2.25 u[IU]/mL (ref 0.35–4.50)

## 2020-06-20 MED ORDER — AMITRIPTYLINE HCL 25 MG PO TABS: 12.5000 mg | ORAL_TABLET | Freq: Every day | ORAL | 1 refills | Status: DC

## 2020-06-20 MED ORDER — TRIAMCINOLONE ACETONIDE 0.1 % EX CREA: 1.0000 "application " | TOPICAL_CREAM | Freq: Two times a day (BID) | CUTANEOUS | 0 refills | Status: AC
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
## 2020-06-20 NOTE — Patient Instructions (Addendum)
A few things to remember from today's visit:   Hypothyroidism, unspecified type - Plan: TSH  Chronic tension-type headache, not intractable - Plan: amitriptyline (ELAVIL) 25 MG tablet  Pain in both lower extremities  Cervicalgia  If you need refills please call your pharmacy. Do not use My Chart to request refills or for acute issues that need immediate attention.   Today Amitriptyline started. Start with 1/2 tab 30 min before bedtime. Please follow with your PCP in 3-4 weeks with a headache diary.  Please be sure medication list is accurate. If a new problem present, please set up appointment sooner than planned today.

## 2020-07-03 ENCOUNTER — Telehealth: Payer: Self-pay | Admitting: Adult Health

## 2020-07-03 NOTE — Telephone Encounter (Signed)
Patient is calling and stated that she was seen by Dr. Martinique and was given medication that is not working. Pt wanted to see if medication can be increased or if she can take something else, please advise. CB is [phone].

## 2020-07-04 NOTE — Telephone Encounter (Signed)
Please advise to keep appointment with her PCP. Thanks, BJ

## 2020-07-07 ENCOUNTER — Ambulatory Visit (INDEPENDENT_AMBULATORY_CARE_PROVIDER_SITE_OTHER): Admitting: Adult Health

## 2020-07-07 ENCOUNTER — Other Ambulatory Visit: Payer: Self-pay

## 2020-07-07 ENCOUNTER — Encounter: Payer: Self-pay | Admitting: Adult Health

## 2020-07-07 DIAGNOSIS — G44229 Chronic tension-type headache, not intractable: Secondary | ICD-10-CM | POA: Diagnosis not present

## 2020-07-07 MED ORDER — AMITRIPTYLINE HCL 50 MG PO TABS: 50.0000 mg | ORAL_TABLET | Freq: Every day | ORAL | 0 refills | Status: DC

## 2020-07-07 NOTE — Progress Notes (Signed)
Subjective:    Patient ID: April Gonzalez, female    DOB: [birth year], 60 y.o.   MRN: [id number]  HPI 60 year old female who  has a past medical history of Allergy, Anemia (05/2012), Fibroids, GERD (gastroesophageal reflux disease), H/O uterine prolapse, History of blood transfusion, Hyperlipidemia, Hypertension, Rectal bleeding, Seizures ([place]), and Thyroid disorder.  She presents to the office today for follow up after being seen by another provider in the office for her chronic daily headaches.  She has also been seen by neurology for this issue and prescribed nortriptyline at one point.  When she was seen by the other provider in the office she was complaining of daily constant headache at the occipital and parietal.  Described as a dull pain.  No associated nausea or vomiting.  She was waking up with a headache.  Prescribed amitriptyline 12.5 mg nightly and was advised to increase to 25 mg if needed.  She reports that she has been taking the 12.5 mg dose and this is "done nothing to help my headaches".   Review of Systems See HPI   Past Medical History:  Diagnosis Date  . Allergy   . Anemia 05/2012   transfusion  . Fibroids    H/O myomectomy  . GERD (gastroesophageal reflux disease)    rare   . H/O uterine prolapse   . History of blood transfusion    x 3   . Hyperlipidemia   . Hypertension   . Rectal bleeding    NOTED AT pv 01-09-17- PT STATES with most BM's for the last 2 years, also hurts   . Seizures ([place])    last seizure age 69 or 10 per pt report-01-09-17 no change in this  . Thyroid disorder     Social History   Socioeconomic History  . Marital status: Married    Spouse name: Not on file  . Number of children: 2  . Years of education: 77  . Highest education level: Not on file  Occupational History  . Not on file  Tobacco Use  . Smoking status: Never Smoker  . Smokeless tobacco: Never Used  Substance and Sexual Activity  . Alcohol use: Yes    Alcohol/week: 0.0  standard drinks    Comment: rare  . Drug use: No  . Sexual activity: Yes    Birth control/protection: Surgical    Comment: BTL   Other Topics Concern  . Not on file  Social History Narrative   She works for [employer]    Married   Has 2 children    Social Determinants of Radio broadcast assistant Strain: Not on file  Food Insecurity: Not on file  Transportation Needs: Not on file  Physical Activity: Not on file  Stress: Not on file  Social Connections: Not on file  Intimate Partner Violence: Not on file    Past Surgical History:  Procedure Laterality Date  . ANKLE SURGERY     One plate, seven screws in the right ankle   . BLADDER SUSPENSION N/A 12/10/2012   Procedure: TRANSVAGINAL TAPE (TVT) PROCEDURE;  Surgeon: Delice Lesch, MD;  Location: [facility];  Service: Gynecology;  Laterality: N/A;  . CYSTOCELE REPAIR N/A 12/10/2012   Procedure: ANTERIOR REPAIR (CYSTOCELE);  Surgeon: Eldred Manges, MD;  Location: [facility];  Service: Gynecology;  Laterality: N/A;  . CYSTOSCOPY N/A 12/10/2012   Procedure: CYSTOSCOPY;  Surgeon: Delice Lesch, MD;  Location: [facility];  Service: Gynecology;  Laterality: N/A;  .  EXCISION VAGINAL CYST    . MYOMECTOMY  40YRS AGO   HYSTEROSCOPIC  . TUBAL LIGATION  10/10/1991  . VAGINAL HYSTERECTOMY N/A 12/10/2012   Procedure: Total Vaginal Hysterectomy;  Surgeon: Eldred Manges, MD;  Location: [facility];  Service: Gynecology;  Laterality: N/A;    Family History  Problem Relation Age of Onset  . Heart failure Father   . Diabetes Mother   . High blood pressure Mother   . Arthritis Mother   . Breast cancer Maternal Aunt        in her 18's   . Breast cancer Cousin   . Thyroid disease Sister   . Colon cancer Neg Hx   . Colon polyps Neg Hx   . Esophageal cancer Neg Hx   . Rectal cancer Neg Hx   . Stomach cancer Neg Hx     Allergies  Allergen Reactions  . Methimazole Anaphylaxis    EMS had to be called and a 7-day's stay in the hospital resulted   . Nitroglycerin Other (See Comments)    Sweating   . Other Itching and Other (See Comments)    Seasonal allergies & dog hair = runny nose also  . Tetracycline Hcl Hypertension  . Promethazine Nausea Only    Made the patient feel like she was going to vomit and it "spaced" her out  . Shrimp [Shellfish Allergy] Itching  . Latex Rash  . Penicillins Other (See Comments)    Has patient had a PCN reaction causing immediate rash, facial/tongue/throat swelling, SOB or lightheadedness with hypotension: Unk Has patient had a PCN reaction causing severe rash involving mucus membranes or skin necrosis: Unk Has patient had a PCN reaction that required hospitalization: Unk Has patient had a PCN reaction occurring within the last 10 years: Yes If all of the above answers are "NO", then may proceed with Cephalosporin use.   . Tape Rash  . Terbutaline Other (See Comments)    Reaction not recalled (??)    Current Outpatient Medications on File Prior to Visit  Medication Sig Dispense Refill  . acetaminophen (TYLENOL) 500 MG tablet Take 500-1,000 mg by mouth every 6 (six) hours as needed for mild pain or headache.    . naproxen (NAPROSYN) 500 MG tablet Take 1 tablet (500 mg total) by mouth 2 (two) times daily as needed. 30 tablet 1  . valACYclovir (VALTREX) 500 MG tablet Take 500 mg by mouth 2 (two) times daily.    Marland Kitchen EPINEPHrine (EPIPEN 2-PAK) 0.3 mg/0.3 mL IJ SOAJ injection Inject 0.3 mLs (0.3 mg total) into the muscle once for 1 dose. If you develop difficulty breathing 0.3 mL 0   No current facility-administered medications on file prior to visit.    BP (!) 150/92 (BP Location: Left Arm, Patient Position: Sitting, Cuff Size: Small)   Pulse 86   Temp 97.8 F (36.6 C) (Oral)   Resp 16   Ht 5\' 3"  (1.6 m)   Wt 192 lb 9.6 oz (87.4 kg)   LMP 12/03/2012   SpO2 98%   BMI 34.12 kg/m       Objective:   Physical Exam Vitals and nursing note reviewed.  Constitutional:      Appearance: Normal  appearance.  Musculoskeletal:        General: Tenderness present.     Left shoulder: Tenderness present. No bony tenderness or crepitus. Normal range of motion. Normal strength. Normal pulse.       Arms:  Skin:    General:  Skin is warm and dry.     Capillary Refill: Capillary refill takes less than 2 seconds.  Neurological:     General: No focal deficit present.     Mental Status: She is alert and oriented to person, place, and time.  Psychiatric:        Mood and Affect: Mood normal.        Behavior: Behavior normal.        Thought Content: Thought content normal.        Judgment: Judgment normal.       Assessment & Plan:  1. Chronic tension-type headache, not intractable -Have her increase Elavil to 50 mg tablets.  Cannot rule of apnea but will have her try the increased dose first.  She will follow-up with me via MyChart in the next 7 to 10 days to let me know how she is doing. - amitriptyline (ELAVIL) 50 MG tablet; Take 1 tablet (50 mg total) by mouth at bedtime.  Dispense: 90 tablet; Refill: 0   [name], NP

## 2020-07-12 ENCOUNTER — Telehealth: Payer: Self-pay | Admitting: *Deleted

## 2020-07-12 ENCOUNTER — Ambulatory Visit

## 2020-07-12 NOTE — Telephone Encounter (Signed)
I called and did not LM on mobile about appt on SS/NP schedule this afternoon but if she does call back and appt open may use.  (call off of my list for cancellations).

## 2020-07-13 ENCOUNTER — Ambulatory Visit (INDEPENDENT_AMBULATORY_CARE_PROVIDER_SITE_OTHER): Admitting: Neurology

## 2020-07-13 ENCOUNTER — Telehealth: Payer: Self-pay | Admitting: Adult Health

## 2020-07-13 ENCOUNTER — Encounter: Payer: Self-pay | Admitting: Neurology

## 2020-07-13 ENCOUNTER — Other Ambulatory Visit: Payer: Self-pay

## 2020-07-13 VITALS — BP 136/90 | HR 78 | Ht 63.0 in | Wt 190.0 lb

## 2020-07-13 DIAGNOSIS — R519 Headache, unspecified: Secondary | ICD-10-CM | POA: Diagnosis not present

## 2020-07-13 MED ORDER — TIZANIDINE HCL 2 MG PO CAPS: 2.0000 mg | ORAL_CAPSULE | Freq: Three times a day (TID) | ORAL | 1 refills | Status: DC | PRN
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
## 2020-07-13 NOTE — Telephone Encounter (Signed)
No longer needed

## 2020-07-13 NOTE — Patient Instructions (Signed)
Given the higher dose of amitriptyline a shot to work, give it 2 weeks Let you try tizanidine to see if helps with neck tension  Try getting a massage, neck stretching, exercise If no better, try something else Use NSAIDs for acute pain  See you back in 4 months

## 2020-07-14 ENCOUNTER — Telehealth (INDEPENDENT_AMBULATORY_CARE_PROVIDER_SITE_OTHER): Admitting: Adult Health

## 2020-07-14 ENCOUNTER — Other Ambulatory Visit: Payer: Self-pay

## 2020-07-14 ENCOUNTER — Other Ambulatory Visit: Payer: Self-pay | Admitting: Adult Health

## 2020-07-14 ENCOUNTER — Ambulatory Visit (INDEPENDENT_AMBULATORY_CARE_PROVIDER_SITE_OTHER)

## 2020-07-14 ENCOUNTER — Telehealth: Payer: Self-pay | Admitting: Adult Health

## 2020-07-14 ENCOUNTER — Encounter: Payer: Self-pay | Admitting: Adult Health

## 2020-07-14 VITALS — Ht 63.0 in | Wt 190.0 lb

## 2020-07-14 DIAGNOSIS — Z23 Encounter for immunization: Secondary | ICD-10-CM | POA: Diagnosis not present

## 2020-07-14 DIAGNOSIS — J0140 Acute pansinusitis, unspecified: Secondary | ICD-10-CM | POA: Diagnosis not present

## 2020-07-14 MED ORDER — AZITHROMYCIN 250 MG PO TABS
ORAL_TABLET | ORAL | 0 refills | Status: DC
Start: 2020-07-14 — End: 2020-09-27

## 2020-07-14 MED ORDER — DOXYCYCLINE HYCLATE 100 MG PO CAPS: 100.0000 mg | ORAL_CAPSULE | Freq: Two times a day (BID) | ORAL | 0 refills | Status: DC

## 2020-07-14 NOTE — Telephone Encounter (Signed)
She was wanting to know if something else was called in to replace what was sent today that she has a drug interaction with that the pharmacy said.

## 2020-07-14 NOTE — Progress Notes (Signed)
Virtual Visit via Video Note  I connected with April Gonzalez  on 07/14/20 at 11:00 AM EST by a video enabled telemedicine application and verified that I am speaking with the correct person using two identifiers.  Location patient: home Location provider:work or home office Persons participating in the virtual visit: patient, provider  I discussed the limitations of evaluation and management by telemedicine and the availability of in person appointments. The patient expressed understanding and agreed to proceed.   HPI: The patient is a 60 y.o. female who presents today for an acute complaint.  Symptoms have been present for at least the last 3 to 4 weeks.  Symptoms include sinus pain and pressure, facial swelling, headaches( chronic), sinus congestion, and discolored rhinorrhea.  Denies fevers or chills.  Symptoms are constant.  Symptoms seem to be worsening.   ROS: See pertinent positives and negatives per HPI.  Past Medical History:  Diagnosis Date  . Allergy   . Anemia 05/2012   transfusion  . Fibroids    H/O myomectomy  . GERD (gastroesophageal reflux disease)    rare   . H/O uterine prolapse   . History of blood transfusion    x 3   . Hyperlipidemia   . Hypertension   . Rectal bleeding    NOTED AT pv 01-09-17- PT STATES with most BM's for the last 2 years, also hurts   . Seizures ([place])    last seizure age 65 or 10 per pt report-01-09-17 no change in this  . Thyroid disorder     Past Surgical History:  Procedure Laterality Date  . ANKLE SURGERY     One plate, seven screws in the right ankle   . BLADDER SUSPENSION N/A 12/10/2012   Procedure: TRANSVAGINAL TAPE (TVT) PROCEDURE;  Surgeon: Delice Lesch, MD;  Location: [facility];  Service: Gynecology;  Laterality: N/A;  . CYSTOCELE REPAIR N/A 12/10/2012   Procedure: ANTERIOR REPAIR (CYSTOCELE);  Surgeon: Eldred Manges, MD;  Location: [facility];  Service: Gynecology;  Laterality: N/A;  . CYSTOSCOPY N/A 12/10/2012   Procedure:  CYSTOSCOPY;  Surgeon: Delice Lesch, MD;  Location: [facility];  Service: Gynecology;  Laterality: N/A;  . EXCISION VAGINAL CYST    . MYOMECTOMY  57YRS AGO   HYSTEROSCOPIC  . TUBAL LIGATION  10/10/1991  . VAGINAL HYSTERECTOMY N/A 12/10/2012   Procedure: Total Vaginal Hysterectomy;  Surgeon: Eldred Manges, MD;  Location: [facility];  Service: Gynecology;  Laterality: N/A;    Family History  Problem Relation Age of Onset  . Heart failure Father   . Diabetes Mother   . High blood pressure Mother   . Arthritis Mother   . Breast cancer Maternal Aunt        in her 58's   . Breast cancer Cousin   . Thyroid disease Sister   . Colon cancer Neg Hx   . Colon polyps Neg Hx   . Esophageal cancer Neg Hx   . Rectal cancer Neg Hx   . Stomach cancer Neg Hx        Current Outpatient Medications:  .  acetaminophen (TYLENOL) 500 MG tablet, Take 500-1,000 mg by mouth every 6 (six) hours as needed for mild pain or headache., Disp: , Rfl:  .  doxycycline (VIBRAMYCIN) 100 MG capsule, Take 1 capsule (100 mg total) by mouth 2 (two) times daily., Disp: 14 capsule, Rfl: 0 .  EPINEPHrine (EPIPEN 2-PAK) 0.3 mg/0.3 mL IJ SOAJ injection, Inject 0.3 mLs (0.3 mg total)  into the muscle once for 1 dose. If you develop difficulty breathing, Disp: 0.3 mL, Rfl: 0 .  valACYclovir (VALTREX) 500 MG tablet, Take 500 mg by mouth 2 (two) times daily., Disp: , Rfl:  .  naproxen (NAPROSYN) 500 MG tablet, Take 1 tablet (500 mg total) by mouth 2 (two) times daily as needed. (Patient not taking: Reported on 07/14/2020), Disp: 30 tablet, Rfl: 1 .  tizanidine (ZANAFLEX) 2 MG capsule, Take 1 capsule (2 mg total) by mouth 3 (three) times daily as needed (for headache). (Patient not taking: Reported on 07/14/2020), Disp: 20 capsule, Rfl: 1  EXAM:  VITALS per patient if applicable:  GENERAL: alert, oriented, appears well and in no acute distress  HEENT: atraumatic, conjunttiva clear, no obvious abnormalities on inspection of external  nose and ears  NECK: normal movements of the head and neck  LUNGS: on inspection no signs of respiratory distress, breathing rate appears normal, no obvious gross SOB, gasping or wheezing  CV: no obvious cyanosis  MS: moves all visible extremities without noticeable abnormality  PSYCH/NEURO: pleasant and cooperative, no obvious depression or anxiety, speech and thought processing grossly intact  ASSESSMENT AND PLAN:  Discussed the following assessment and plan:  1. Acute non-recurrent pansinusitis - Will treat due to symptoms and duration  - Follow up if no improvement in the next 2-3 days  - doxycycline (VIBRAMYCIN) 100 MG capsule; Take 1 capsule (100 mg total) by mouth 2 (two) times daily.  Dispense: 14 capsule; Refill: 0        I discussed the assessment and treatment plan with the patient. The patient was provided an opportunity to ask questions and all were answered. The patient agreed with the plan and demonstrated an understanding of the instructions.   The patient was advised to call back or seek an in-person evaluation if the symptoms worsen or if the condition fails to improve as anticipated.   [name], NP

## 2020-07-14 NOTE — Progress Notes (Deleted)
Patient: April Gonzalez MRN: [id number] DOB: 29-Jul-1960 PCP: [name], NP     I connected with April Gonzalez on 07/14/20 at @CHLAPPTIME @ by a video enabled telemedicine application and verified that I am speaking with the correct person using two identifiers.  Location patient: Home Location provider: [facility], Office Persons participating in this virtual visit: ***  I discussed the limitations of evaluation and management by telemedicine and the availability of in person appointments. The patient expressed understanding and agreed to proceed.   Subjective:  Chief Complaint  Patient presents with  . Sinus Problem    Pt complains of pressure and pain. She denies fever, SOB, Chest pain.    HPI: The patient is a 60 y.o. female who presents today for ***  Review of Systems  Constitutional: Negative for fever.  HENT: Positive for sinus pressure and sinus pain. Negative for congestion.     Allergies Patient is allergic to methimazole, nitroglycerin, other, tetracycline hcl, promethazine, shrimp [shellfish allergy], latex, penicillins, tape, and terbutaline.  Past Medical History Patient  has a past medical history of Allergy, Anemia (05/2012), Fibroids, GERD (gastroesophageal reflux disease), H/O uterine prolapse, History of blood transfusion, Hyperlipidemia, Hypertension, Rectal bleeding, Seizures ([place]), and Thyroid disorder.  Surgical History Patient  has a past surgical history that includes Excision vaginal cyst; Tubal ligation (10/10/1991); Myomectomy (53YRS AGO); Vaginal hysterectomy (N/A, 12/10/2012); Cystocele repair (N/A, 12/10/2012); Bladder suspension (N/A, 12/10/2012); Cystoscopy (N/A, 12/10/2012); and Ankle surgery.  Family History Pateint's family history includes Arthritis in her mother; Breast cancer in her cousin and maternal aunt; Diabetes in her mother; Heart failure in her father; High blood pressure in her mother; Thyroid disease in her sister.  Social  History Patient  reports that she has never smoked. She has never used smokeless tobacco. She reports current alcohol use. She reports that she does not use drugs.    Objective: Vitals:   07/14/20 1106  Weight: 190 lb 0.6 oz (86.2 kg)  Height: 5\' 3"  (1.6 m)    Body mass index is 33.66 kg/m.  Physical Exam     Assessment/plan:      No follow-ups on file.  Records requested if needed. Time spent with patient: *** minutes, of which >50% was spent in obtaining information about her symptoms, reviweing her previous labs, evaluations, and treatments, counseling her about her conditions (please see discussed topics above), and developing a plan to further investigate it; she had a number of questions which I addressed.    Tobe Sos, RMA [facility] 07/14/2020

## 2020-07-16 NOTE — Progress Notes (Signed)
I have read the note, and I agree with the clinical assessment and plan.  Charles K Willis   

## 2020-09-26 ENCOUNTER — Other Ambulatory Visit: Payer: Self-pay

## 2020-09-27 ENCOUNTER — Encounter: Payer: Self-pay | Admitting: Adult Health

## 2020-09-27 ENCOUNTER — Ambulatory Visit (INDEPENDENT_AMBULATORY_CARE_PROVIDER_SITE_OTHER): Admitting: Adult Health

## 2020-09-27 VITALS — BP 140/98 | HR 83 | Temp 98.2°F | Ht 63.0 in | Wt 195.0 lb

## 2020-09-27 DIAGNOSIS — W57XXXA Bitten or stung by nonvenomous insect and other nonvenomous arthropods, initial encounter: Secondary | ICD-10-CM

## 2020-09-27 DIAGNOSIS — S80862A Insect bite (nonvenomous), left lower leg, initial encounter: Secondary | ICD-10-CM

## 2020-09-27 MED ORDER — DOXYCYCLINE HYCLATE 100 MG PO CAPS: 200.0000 mg | ORAL_CAPSULE | Freq: Once | ORAL | 0 refills | Status: AC

## 2020-09-27 NOTE — Progress Notes (Signed)
Subjective:    Patient ID: April Gonzalez, female    DOB: [birth year], 61 y.o.   MRN: [id number]  HPI 60 year old female who  has a past medical history of Allergy, Anemia (05/2012), Fibroids, GERD (gastroesophageal reflux disease), H/O uterine prolapse, History of blood transfusion, Hyperlipidemia, Hypertension, Rectal bleeding, Seizures ([place]), and Thyroid disorder.  She presents to the office today for concern of tick bite on her left leg.  She reports that she is able to remove the tick in its entirety, tick was engorged, she is unsure how long the tick was present but believes it was greater than 72 hours.  She denies fevers, chills, or body rash.  Has some localized redness at the site of the tick bite   Review of Systems See HPI   Past Medical History:  Diagnosis Date  . Allergy   . Anemia 05/2012   transfusion  . Fibroids    H/O myomectomy  . GERD (gastroesophageal reflux disease)    rare   . H/O uterine prolapse   . History of blood transfusion    x 3   . Hyperlipidemia   . Hypertension   . Rectal bleeding    NOTED AT pv 01-09-17- PT STATES with most BM's for the last 2 years, also hurts   . Seizures ([place])    last seizure age 33 or 10 per pt report-01-09-17 no change in this  . Thyroid disorder     Social History   Socioeconomic History  . Marital status: Married    Spouse name: Not on file  . Number of children: 2  . Years of education: 19  . Highest education level: Not on file  Occupational History  . Not on file  Tobacco Use  . Smoking status: Never Smoker  . Smokeless tobacco: Never Used  Substance and Sexual Activity  . Alcohol use: Yes    Alcohol/week: 0.0 standard drinks    Comment: rare  . Drug use: No  . Sexual activity: Yes    Birth control/protection: Surgical    Comment: BTL   Other Topics Concern  . Not on file  Social History Narrative   She works for [employer]    Married   Has 2 children    Social Determinants of Systems developer Strain: Not on file  Food Insecurity: Not on file  Transportation Needs: Not on file  Physical Activity: Not on file  Stress: Not on file  Social Connections: Not on file  Intimate Partner Violence: Not on file    Past Surgical History:  Procedure Laterality Date  . ANKLE SURGERY     One plate, seven screws in the right ankle   . BLADDER SUSPENSION N/A 12/10/2012   Procedure: TRANSVAGINAL TAPE (TVT) PROCEDURE;  Surgeon: Delice Lesch, MD;  Location: [facility];  Service: Gynecology;  Laterality: N/A;  . CYSTOCELE REPAIR N/A 12/10/2012   Procedure: ANTERIOR REPAIR (CYSTOCELE);  Surgeon: Eldred Manges, MD;  Location: [facility];  Service: Gynecology;  Laterality: N/A;  . CYSTOSCOPY N/A 12/10/2012   Procedure: CYSTOSCOPY;  Surgeon: Delice Lesch, MD;  Location: [facility];  Service: Gynecology;  Laterality: N/A;  . EXCISION VAGINAL CYST    . MYOMECTOMY  77YRS AGO   HYSTEROSCOPIC  . TUBAL LIGATION  10/10/1991  . VAGINAL HYSTERECTOMY N/A 12/10/2012   Procedure: Total Vaginal Hysterectomy;  Surgeon: Eldred Manges, MD;  Location: [facility];  Service: Gynecology;  Laterality: N/A;  Family History  Problem Relation Age of Onset  . Heart failure Father   . Diabetes Mother   . High blood pressure Mother   . Arthritis Mother   . Breast cancer Maternal Aunt        in her 27's   . Breast cancer Cousin   . Thyroid disease Sister   . Colon cancer Neg Hx   . Colon polyps Neg Hx   . Esophageal cancer Neg Hx   . Rectal cancer Neg Hx   . Stomach cancer Neg Hx     Allergies  Allergen Reactions  . Methimazole Anaphylaxis    EMS had to be called and a 7-day's stay in the hospital resulted  . Nitroglycerin Other (See Comments)    Sweating   . Other Itching and Other (See Comments)    Seasonal allergies & dog hair = runny nose also  . Tetracycline Hcl Hypertension  . Promethazine Nausea Only    Made the patient feel like she was going to vomit and it "spaced" her out  . Shrimp  [Shellfish Allergy] Itching  . Latex Rash  . Penicillins Other (See Comments)    Has patient had a PCN reaction causing immediate rash, facial/tongue/throat swelling, SOB or lightheadedness with hypotension: Unk Has patient had a PCN reaction causing severe rash involving mucus membranes or skin necrosis: Unk Has patient had a PCN reaction that required hospitalization: Unk Has patient had a PCN reaction occurring within the last 10 years: Yes If all of the above answers are "NO", then may proceed with Cephalosporin use.   . Tape Rash  . Terbutaline Other (See Comments)    Reaction not recalled (??)    Current Outpatient Medications on File Prior to Visit  Medication Sig Dispense Refill  . acetaminophen (TYLENOL) 500 MG tablet Take 500-1,000 mg by mouth every 6 (six) hours as needed for mild pain or headache.    . naproxen (NAPROSYN) 500 MG tablet Take 1 tablet (500 mg total) by mouth 2 (two) times daily as needed. 30 tablet 1  . tizanidine (ZANAFLEX) 2 MG capsule Take 1 capsule (2 mg total) by mouth 3 (three) times daily as needed (for headache). 20 capsule 1  . valACYclovir (VALTREX) 500 MG tablet Take 500 mg by mouth 2 (two) times daily.    Marland Kitchen EPINEPHrine (EPIPEN 2-PAK) 0.3 mg/0.3 mL IJ SOAJ injection Inject 0.3 mLs (0.3 mg total) into the muscle once for 1 dose. If you develop difficulty breathing 0.3 mL 0   No current facility-administered medications on file prior to visit.    BP (!) 140/98   Pulse 83   Temp 98.2 F (36.8 C) (Oral)   Ht 5\' 3"  (1.6 m)   Wt 195 lb (88.5 kg)   LMP 12/03/2012   SpO2 97%   BMI 34.54 kg/m       Objective:   Physical Exam Vitals and nursing note reviewed.  Constitutional:      Appearance: Normal appearance.  Musculoskeletal:        General: Normal range of motion.  Skin:    General: Skin is warm and dry.     Capillary Refill: Capillary refill takes less than 2 seconds.     Findings: Erythema present.     Comments: localized erythema  noted on left calf.  No bull's-eye sign.  Neurological:     General: No focal deficit present.     Mental Status: She is alert and oriented to person, place, and time.  Psychiatric:        Mood and Affect: Mood normal.        Behavior: Behavior normal.        Thought Content: Thought content normal.        Judgment: Judgment normal.       Assessment & Plan:  1. Nonvenomous insect bite of left lower leg, initial encounter - Will cover with phylactic treatment due to unknown timeframe. - doxycycline (VIBRAMYCIN) 100 MG capsule; Take 2 capsules (200 mg total) by mouth once for 1 dose.  Dispense: 2 capsule; Refill: 0  [name], NP

## 2020-10-06 ENCOUNTER — Telehealth: Payer: Self-pay | Admitting: Adult Health

## 2020-10-06 NOTE — Telephone Encounter (Signed)
Patient notified of update  and verbalized understanding. 

## 2020-10-06 NOTE — Telephone Encounter (Addendum)
Swelling and redness at the site of the bite. Doxy taken as prescribed. Pt also stated that she notice her throat feels like it closed a little. Pt claimed that it could have been from her eating shrimp accidently at a cooking class.

## 2020-10-06 NOTE — Telephone Encounter (Signed)
Patient is calling and stated that she was seen on 5/25 for a tick bite and is still experiencing swelling, redness and the area is itchy. Per patient medication didn't help and wanted to see what the provider recommended, please advise. CB is [phone]

## 2020-10-06 NOTE — Telephone Encounter (Signed)
Pt returned the call to the office and would like to have a return call

## 2020-10-06 NOTE — Telephone Encounter (Signed)
Spoke to April Gonzalez verbally and he advised that the site will be red and swollen for a while. Tried to call pt for update but no answer and mailbox is full.

## 2020-10-28 ENCOUNTER — Encounter (HOSPITAL_BASED_OUTPATIENT_CLINIC_OR_DEPARTMENT_OTHER): Payer: Self-pay

## 2020-10-28 ENCOUNTER — Emergency Department (HOSPITAL_BASED_OUTPATIENT_CLINIC_OR_DEPARTMENT_OTHER)
Admission: EM | Admit: 2020-10-28 | Discharge: 2020-10-28 | Disposition: A | Discharge status: Home / Self Care | Attending: Emergency Medicine | Admitting: Emergency Medicine

## 2020-10-28 ENCOUNTER — Other Ambulatory Visit: Payer: Self-pay

## 2020-10-28 DIAGNOSIS — R0981 Nasal congestion: Secondary | ICD-10-CM | POA: Insufficient documentation

## 2020-10-28 DIAGNOSIS — Z91040 Latex allergy status: Secondary | ICD-10-CM | POA: Insufficient documentation

## 2020-10-28 DIAGNOSIS — Z20822 Contact with and (suspected) exposure to covid-19: Secondary | ICD-10-CM | POA: Insufficient documentation

## 2020-10-28 DIAGNOSIS — I10 Essential (primary) hypertension: Secondary | ICD-10-CM | POA: Insufficient documentation

## 2020-10-28 DIAGNOSIS — E039 Hypothyroidism, unspecified: Secondary | ICD-10-CM | POA: Insufficient documentation

## 2020-10-28 DIAGNOSIS — H6983 Other specified disorders of Eustachian tube, bilateral: Secondary | ICD-10-CM

## 2020-10-28 LAB — RESP PANEL BY RT-PCR (FLU A&B, COVID) ARPGX2
Influenza A by PCR: NEGATIVE
Influenza B by PCR: NEGATIVE
SARS Coronavirus 2 by RT PCR: NEGATIVE

## 2020-10-28 LAB — GROUP A STREP BY PCR: Group A Strep by PCR: NOT DETECTED

## 2020-10-28 NOTE — Discharge Instructions (Addendum)
Your work-up and exam today was reassuring.  Please follow-up with your primary care provider regarding today's ED encounter.  I have placed a referral for you to [facility].  Please call them to schedule an appointment for ongoing evaluation and management of your chronic bilateral ear discomfort.  I suspect that you have an element of eustachian tube dysfunction.  I would like for you to take daily antihistamines (I recommend cetirizine 10 mg daily) and Flonase.  I believe that this would help with your symptoms.  Given that you also have a moderate amount of cerumen in right canal, recommending over-the-counter Debrox (hydrogen peroxide) solution.  Return to the ER seek immediate medical attention should you experience any new or worsening symptoms.

## 2020-10-28 NOTE — ED Triage Notes (Signed)
Pt c/o sore throat starting today. Husband here as well for same. Denies fever

## 2020-10-28 NOTE — ED Provider Notes (Signed)
[facility] EMERGENCY DEPARTMENT Provider Note   CSN: [id number] Arrival date & time: 10/28/20  0946     History Chief Complaint  Patient presents with   Sore Throat    April Gonzalez is a 60 y.o. female with no relevant past medical history presents the ED with complaints of sore throat x2 days.  She is accompanied by her husband who is at bedside.  She would like to be tested for COVID-19 and influenza given recent exposure to her granddaughter which she states was not ill.  However, there was a recent family gathering and she and her husband are both now experiencing URI symptoms.  She wants to err on the side of caution.  Patient also states that she has had chronic ear fullness that she feels is draining towards posterior oropharynx.  She states that this issue has been going on for over a year.  She had seen her primary care provider about this a few weeks ago and was subsequently referred to ENT.  However, she has not yet heard from them.  She reports that she has very small ear canals which she believes is contributing to her symptoms.  She has not taken anything specifically for her symptoms.  She admits that her symptoms are largely chronic in nature and knows that ENT referral is most optimal option.  She denies any fevers, chills, trismus, inability eat or drink, chest pain, shortness of breath, abdominal pain, nausea or vomiting, or changes in bowel habits.  HPI     Past Medical History:  Diagnosis Date   Allergy    Anemia 05/2012   transfusion   Fibroids    H/O myomectomy   GERD (gastroesophageal reflux disease)    rare    H/O uterine prolapse    History of blood transfusion    x 3    Hyperlipidemia    Hypertension    Rectal bleeding    NOTED AT pv 01-09-17- PT STATES with most BM's for the last 2 years, also hurts    Seizures ([place])    last seizure age 58 or 10 per pt report-01-09-17 no change in this   Thyroid disorder     Patient Active Problem List    Diagnosis Date Noted   Weakness of right upper extremity 09/06/2019   Head injury 01/20/2019   Internal hemorrhoids 02/28/2017   Irritable bowel syndrome 02/28/2017   Skin rash 11/30/2016   Acute non-recurrent maxillary sinusitis 11/09/2016   Chronic daily headache 07/03/2016   Osteoarthritis 10/20/2015   Neck swelling 10/20/2015   Hypothyroidism 06/28/2015   Essential hypertension 06/28/2015   Faintness 04/28/2015   Gustatory hallucinations 04/28/2015   Hyperlipidemia 01/17/2015   Goiter 01/06/2015   Low vitamin B12 level 01/06/2015   Diffuse abdominal pain 07/14/2014   Hematochezia 07/14/2014   Rectal bleeding 06/21/2014   Numbness and tingling of foot 06/21/2014   Dense breasts 07/13/2012   Seizures ([place])    Fibroids    Iron deficiency anemia 01/06/2007    Past Surgical History:  Procedure Laterality Date   ANKLE SURGERY     One plate, seven screws in the right ankle    BLADDER SUSPENSION N/A 12/10/2012   Procedure: TRANSVAGINAL TAPE (TVT) PROCEDURE;  Surgeon: Delice Lesch, MD;  Location: [facility];  Service: Gynecology;  Laterality: N/A;   CYSTOCELE REPAIR N/A 12/10/2012   Procedure: ANTERIOR REPAIR (CYSTOCELE);  Surgeon: Eldred Manges, MD;  Location: [facility];  Service: Gynecology;  Laterality: N/A;  CYSTOSCOPY N/A 12/10/2012   Procedure: CYSTOSCOPY;  Surgeon: Delice Lesch, MD;  Location: [facility];  Service: Gynecology;  Laterality: N/A;   EXCISION VAGINAL CYST     MYOMECTOMY  63YRS AGO   HYSTEROSCOPIC   TUBAL LIGATION  10/10/1991   VAGINAL HYSTERECTOMY N/A 12/10/2012   Procedure: Total Vaginal Hysterectomy;  Surgeon: Eldred Manges, MD;  Location: [facility];  Service: Gynecology;  Laterality: N/A;     OB History     Gravida  2   Para  2   Term  2   Preterm      AB      Living  2      SAB      IAB      Ectopic      Multiple      Live Births              Family History  Problem Relation Age of Onset   Heart failure Father    Diabetes  Mother    High blood pressure Mother    Arthritis Mother    Breast cancer Maternal Aunt        in her 65's    Breast cancer Cousin    Thyroid disease Sister    Colon cancer Neg Hx    Colon polyps Neg Hx    Esophageal cancer Neg Hx    Rectal cancer Neg Hx    Stomach cancer Neg Hx     Social History   Tobacco Use   Smoking status: Never   Smokeless tobacco: Never  Substance Use Topics   Alcohol use: Yes    Alcohol/week: 0.0 standard drinks    Comment: rare   Drug use: No    Home Medications Prior to Admission medications   Medication Sig Start Date End Date Taking? Authorizing Provider  acetaminophen (TYLENOL) 500 MG tablet Take 500-1,000 mg by mouth every 6 (six) hours as needed for mild pain or headache.    [provider]  EPINEPHrine (EPIPEN 2-PAK) 0.3 mg/0.3 mL IJ SOAJ injection Inject 0.3 mLs (0.3 mg total) into the muscle once for 1 dose. If you develop difficulty breathing 12/07/19 12/07/19  Nafziger, Tommi Rumps, NP  naproxen (NAPROSYN) 500 MG tablet Take 1 tablet (500 mg total) by mouth 2 (two) times daily as needed. 12/31/19   Nafziger, Tommi Rumps, NP  tizanidine (ZANAFLEX) 2 MG capsule Take 1 capsule (2 mg total) by mouth 3 (three) times daily as needed (for headache). 07/13/20   Suzzanne Cloud, NP  valACYclovir (VALTREX) 500 MG tablet Take 500 mg by mouth 2 (two) times daily. 12/24/18   [provider]    Allergies    Methimazole, Nitroglycerin, Other, Tetracycline hcl, Promethazine, Shrimp [shellfish allergy], Latex, Penicillins, Tape, and Terbutaline  Review of Systems   Review of Systems  All other systems reviewed and are negative.  Physical Exam Updated Vital Signs BP (!) 172/94 (BP Location: Right Arm)   Pulse 83   Temp 97.9 F (36.6 C) (Oral)   Resp 18   Ht 5\' 3"  (1.6 m)   Wt 83.9 kg   LMP 12/03/2012   SpO2 99%   BMI 32.77 kg/m   Physical Exam Vitals and nursing note reviewed. Exam conducted with a chaperone present.  Constitutional:       General: She is not in acute distress.    Appearance: Normal appearance. She is not ill-appearing or toxic-appearing.  HENT:     Head: Normocephalic and atraumatic.  Right Ear: There is no impacted cerumen.     Left Ear: There is no impacted cerumen.     Ears:     Comments: Left ear: No external TTP.  Canal is small, tortuous.  However, TM visualized and pale/pearly.  Right ear: No external TTP.  Canal small, tortuous.  Moderate amount of cerumen.  No impaction, but given tortuosity TM difficult to visualize.    Mouth/Throat:     Pharynx: Oropharynx is clear.     Comments: Patent oropharynx.  No trismus.  No significant erythema.  No uvular deviation.  No exudates.  Tolerating secretions well. Eyes:     General: No scleral icterus.    Conjunctiva/sclera: Conjunctivae normal.  Cardiovascular:     Rate and Rhythm: Normal rate.     Pulses: Normal pulses.  Pulmonary:     Effort: Pulmonary effort is normal. No respiratory distress.     Breath sounds: Normal breath sounds.     Comments: CTA bilaterally. Musculoskeletal:     Cervical back: Normal range of motion. No rigidity.  Skin:    General: Skin is dry.  Neurological:     Mental Status: She is alert and oriented to person, place, and time.     GCS: GCS eye subscore is 4. GCS verbal subscore is 5. GCS motor subscore is 6.  Psychiatric:        Mood and Affect: Mood normal.        Behavior: Behavior normal.        Thought Content: Thought content normal.    ED Results / Procedures / Treatments   Labs (all labs ordered are listed, but only abnormal results are displayed) Labs Reviewed  GROUP A STREP BY PCR  RESP PANEL BY RT-PCR (FLU A&B, COVID) [id number]    EKG None  Radiology No results found.  Procedures Procedures   Medications Ordered in ED Medications - No data to display  ED Course  I have reviewed the triage vital signs and the nursing notes.  Pertinent labs & imaging results that were available during my  care of the patient were reviewed by me and considered in my medical decision making (see chart for details).    MDM Rules/Calculators/A&P                          April Gonzalez was evaluated in Emergency Department on 10/28/2020 for the symptoms described in the history of present illness. She was evaluated in the context of the global COVID-19 pandemic, which necessitated consideration that the patient might be at risk for infection with the SARS-CoV-2 virus that causes COVID-19. Institutional protocols and algorithms that pertain to the evaluation of patients at risk for COVID-19 are in a state of rapid change based on information released by regulatory bodies including the CDC and federal and state organizations. These policies and algorithms were followed during the patient's care in the ED.  I personally reviewed patient's medical chart and all notes from triage and staff during today's encounter. I have also ordered and reviewed all labs and imaging that I felt to be medically necessary in the evaluation of this patient's complaints and with consideration of their physical exam. If needed, translation services were available and utilized.   Suspect viral URI versus viral pharyngitis.  However, will obtain group A strep by PCR.  If negative, conservative management.  Patient able to speak in complete sentences without difficulty swallowing or trouble handling secretions.  No  stridor, wheezing, tripoding, grey pseudomembrane, trismus, uvular deviation, sublingual/submandibular/submental swelling or induration, nuchal rigidity, or neck pain.  Low suspicion for deep tissue infection such as PTA or RPA, epiglottitis, vertebral dissection, meningitis, or other emergent pathology.    Discussed conservative therapy such as warm tea with honey, Chloraseptic spray, throat lozenges, and salt-water gargles.  Encouraged NSAIDs and emphasized importance of oral hydration and antipyretics as needed for fever  control.    As for her chronic ear congestion and concern for postnasal drip contribute to her symptoms, recommending antihistamines and Flonase.  I am also encouraging her to consider Debrox given that there was moderate amount of cerumen in right ear.  No obvious impaction.  Group A strep PCR is negative.  Viral vs chronic.   Outpatient follow-up provided.  ER return precautions discussed.  Patient voices understanding is agreeable to the plan.  Final Clinical Impression(s) / ED Diagnoses Final diagnoses:  Sinus congestion    Rx / DC Orders ED Discharge Orders     None        Corena Herter, PA-C 10/28/20 [place], DO 10/28/20 1214

## 2020-11-16 ENCOUNTER — Ambulatory Visit: Admitting: Neurology

## 2020-11-16 ENCOUNTER — Encounter: Payer: Self-pay | Admitting: Neurology
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
## 2020-11-21 ENCOUNTER — Telehealth: Payer: Self-pay | Admitting: Adult Health

## 2020-11-21 NOTE — Telephone Encounter (Signed)
Patient called checking on a referral that Tommi Rumps was supposed to order for ENT. I'm not seeing a referral.   Please advise

## 2020-11-21 NOTE — Telephone Encounter (Signed)
Reeds noted! That's what we were thinking. Will notify pt.

## 2020-11-21 NOTE — Telephone Encounter (Signed)
Called pt no answer. Pt does not need a referral for ENT. Pt needs to call [facility] since she has already been there this year to schedule an appt.

## 2020-11-21 NOTE — Telephone Encounter (Signed)
Ok to place referral. The old one is from 2021

## 2020-11-22 ENCOUNTER — Telehealth (INDEPENDENT_AMBULATORY_CARE_PROVIDER_SITE_OTHER): Admitting: Neurology

## 2020-11-22 ENCOUNTER — Encounter: Payer: Self-pay | Admitting: Neurology

## 2020-11-22 DIAGNOSIS — R519 Headache, unspecified: Secondary | ICD-10-CM

## 2020-11-22 NOTE — Progress Notes (Signed)
I have read the note, and I agree with the clinical assessment and plan.  Cap Massi K Janai Brannigan   
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
## 2020-11-22 NOTE — Telephone Encounter (Signed)
Patient notified of update  and verbalized understanding. [facility] info given to the pt. No further action needed!

## 2020-11-24 ENCOUNTER — Telehealth: Payer: Self-pay | Admitting: Adult Health

## 2020-11-24 NOTE — Telephone Encounter (Signed)
Pt notified to go to UC. Pt verbalized understanding.

## 2020-11-24 NOTE — Telephone Encounter (Signed)
Pt call and stated she want to know will cory call her something for the earache at  [name] [id number] - [place], [place] Phone:  [phone]  Fax:  [phone]

## 2020-12-12 DIAGNOSIS — H9041 Sensorineural hearing loss, unilateral, right ear, with unrestricted hearing on the contralateral side: Secondary | ICD-10-CM | POA: Insufficient documentation

## 2020-12-25 ENCOUNTER — Telehealth: Payer: Self-pay | Admitting: Adult Health

## 2020-12-25 NOTE — Telephone Encounter (Signed)
Pt has been scheduled.  °

## 2020-12-25 NOTE — Telephone Encounter (Signed)
Pt was seen in the urgent care on 8/19 .losartan (COZAAR) 50 MG tablet. Take 1 tablet (50 mg total) by mouth daily for 30 days. It was added to her list of medications to take, and she wants to know if it is ok to take. Pt would like the CMA or Georgina Snell to leave a detailed message on the answering machine because she will be at work at 10:00 am to let her know if she should take it.

## 2020-12-27 ENCOUNTER — Ambulatory Visit (INDEPENDENT_AMBULATORY_CARE_PROVIDER_SITE_OTHER): Admitting: Adult Health

## 2020-12-27 ENCOUNTER — Other Ambulatory Visit: Payer: Self-pay

## 2020-12-27 ENCOUNTER — Encounter: Payer: Self-pay | Admitting: Adult Health

## 2020-12-27 VITALS — BP 160/110 | HR 95 | Temp 98.1°F | Ht 63.0 in | Wt 198.0 lb

## 2020-12-27 DIAGNOSIS — I10 Essential (primary) hypertension: Secondary | ICD-10-CM | POA: Diagnosis not present

## 2020-12-27 MED ORDER — LISINOPRIL 20 MG PO TABS: 20.0000 mg | ORAL_TABLET | Freq: Every day | ORAL | 1 refills | Status: DC

## 2020-12-27 NOTE — Patient Instructions (Addendum)
It was great seeing you today   I am going to put you on Lisinopril instead of Cozzar   Please follow up for your physical exam in 4-6 weeks.   Come fasting to your next appointment

## 2020-12-27 NOTE — Progress Notes (Signed)
Subjective:    Patient ID: April Gonzalez, female    DOB: 02-08-1961, 60 y.o.   MRN: [id number]  HPI  60 year old female who  has a past medical history of Allergy, Anemia (05/2012), Fibroids, GERD (gastroesophageal reflux disease), H/O uterine prolapse, History of blood transfusion, Hyperlipidemia, Hypertension, Rectal bleeding, Seizures ([place]), and Thyroid disorder.  She presents to the office today for follow up after being seen at [facility] urgent care of [facility] on 12/22/2020. She was originally seen for a sore throat. Her rapid strep was negative.   She was also noted to have elevated blood pressure readings. She was on Hyzaar in the past but stopped taking this an unknown amount of time ago due to her blood pressure being normal. She was restarted on Cozaar 50 mg by Urgent Care.   She has not started on Losartan yet due to " all of my family members have had issues with losaratan and have not been able to take it so I have not started it yet". She was advised that she has been on Cozaar in the past. She does not feel comfortable taking it and would like to switch to a different blood pressure medication   Review of Systems See HPI   Past Medical History:  Diagnosis Date   Allergy    Anemia 05/2012   transfusion   Fibroids    H/O myomectomy   GERD (gastroesophageal reflux disease)    rare    H/O uterine prolapse    History of blood transfusion    x 3    Hyperlipidemia    Hypertension    Rectal bleeding    NOTED AT pv 01-09-17- PT STATES with most BM's for the last 2 years, also hurts    Seizures ([place])    last seizure age 62 or 10 per pt report-01-09-17 no change in this   Thyroid disorder     Social History   Socioeconomic History   Marital status: Married    Spouse name: Not on file   Number of children: 2   Years of education: 15   Highest education level: Not on file  Occupational History   Not on file  Tobacco Use   Smoking status: Never   Smokeless tobacco: Never   Substance and Sexual Activity   Alcohol use: Yes    Alcohol/week: 0.0 standard drinks    Comment: rare   Drug use: No   Sexual activity: Yes    Birth control/protection: Surgical    Comment: BTL   Other Topics Concern   Not on file  Social History Narrative   She works for [employer]    Married   Has 2 children    Social Determinants of Health   Financial Resource Strain: Not on file  Food Insecurity: Not on file  Transportation Needs: Not on file  Physical Activity: Not on file  Stress: Not on file  Social Connections: Not on file  Intimate Partner Violence: Not on file    Past Surgical History:  Procedure Laterality Date   ANKLE SURGERY     One plate, seven screws in the right ankle    BLADDER SUSPENSION N/A 12/10/2012   Procedure: TRANSVAGINAL TAPE (TVT) PROCEDURE;  Surgeon: Delice Lesch, MD;  Location: [facility];  Service: Gynecology;  Laterality: N/A;   CYSTOCELE REPAIR N/A 12/10/2012   Procedure: ANTERIOR REPAIR (CYSTOCELE);  Surgeon: Eldred Manges, MD;  Location: [facility];  Service: Gynecology;  Laterality: N/A;  CYSTOSCOPY N/A 12/10/2012   Procedure: CYSTOSCOPY;  Surgeon: Delice Lesch, MD;  Location: [facility];  Service: Gynecology;  Laterality: N/A;   EXCISION VAGINAL CYST     MYOMECTOMY  60YRS AGO   HYSTEROSCOPIC   TUBAL LIGATION  10/10/1991   VAGINAL HYSTERECTOMY N/A 12/10/2012   Procedure: Total Vaginal Hysterectomy;  Surgeon: Eldred Manges, MD;  Location: [facility];  Service: Gynecology;  Laterality: N/A;    Family History  Problem Relation Age of Onset   Heart failure Father    Diabetes Mother    High blood pressure Mother    Arthritis Mother    Breast cancer Maternal Aunt        in her 34's    Breast cancer Cousin    Thyroid disease Sister    Colon cancer Neg Hx    Colon polyps Neg Hx    Esophageal cancer Neg Hx    Rectal cancer Neg Hx    Stomach cancer Neg Hx     Allergies  Allergen Reactions   Methimazole Anaphylaxis    EMS had to be  called and a 7-day's stay in the hospital resulted   Nitroglycerin Other (See Comments)    Sweating    Other Itching and Other (See Comments)    Seasonal allergies & dog hair = runny nose also   Tetracycline Hcl Hypertension   Promethazine Nausea Only    Made the patient feel like she was going to vomit and it "spaced" her out   Shrimp [Shellfish Allergy] Itching   Latex Rash   Penicillins Other (See Comments)    Has patient had a PCN reaction causing immediate rash, facial/tongue/throat swelling, SOB or lightheadedness with hypotension: Unk Has patient had a PCN reaction causing severe rash involving mucus membranes or skin necrosis: Unk Has patient had a PCN reaction that required hospitalization: Unk Has patient had a PCN reaction occurring within the last 10 years: Yes If all of the above answers are "NO", then may proceed with Cephalosporin use.    Tape Rash   Terbutaline Other (See Comments)    Reaction not recalled (??)    Current Outpatient Medications on File Prior to Visit  Medication Sig Dispense Refill   acetaminophen (TYLENOL) 500 MG tablet Take 500-1,000 mg by mouth every 6 (six) hours as needed for mild pain or headache.     naproxen (NAPROSYN) 500 MG tablet Take 1 tablet (500 mg total) by mouth 2 (two) times daily as needed. 30 tablet 1   valACYclovir (VALTREX) 500 MG tablet Take 500 mg by mouth 2 (two) times daily.     EPINEPHrine (EPIPEN 2-PAK) 0.3 mg/0.3 mL IJ SOAJ injection Inject 0.3 mLs (0.3 mg total) into the muscle once for 1 dose. If you develop difficulty breathing 0.3 mL 0   No current facility-administered medications on file prior to visit.    BP (!) 160/110   Pulse 95   Temp 98.1 F (36.7 C) (Oral)   Ht '5\' 3"'$  (1.6 m)   Wt 198 lb (89.8 kg)   LMP 12/03/2012   SpO2 94%   BMI 35.07 kg/m       Objective:   Physical Exam Vitals and nursing note reviewed.  Constitutional:      Appearance: Normal appearance.  Cardiovascular:     Rate and  Rhythm: Normal rate and regular rhythm.     Pulses: Normal pulses.     Heart sounds: Normal heart sounds.  Pulmonary:     Effort:  Pulmonary effort is normal.     Breath sounds: Normal breath sounds.  Musculoskeletal:        General: Normal range of motion.  Skin:    General: Skin is warm and dry.     Capillary Refill: Capillary refill takes less than 2 seconds.  Neurological:     General: No focal deficit present.     Mental Status: She is alert and oriented to person, place, and time.  Psychiatric:        Mood and Affect: Mood normal.        Behavior: Behavior normal.        Thought Content: Thought content normal.        Judgment: Judgment normal.       Assessment & Plan:  1. Essential hypertension - Not controlled and not at goal.  - Will put on lisinopril.  - Have her follow up in 4 weeks for her CPE - Monitor BP at home and bring log to next appointment  - lisinopril (ZESTRIL) 20 MG tablet; Take 1 tablet (20 mg total) by mouth daily.  Dispense: 30 tablet; Refill: 1  [name], NP

## 2020-12-29 ENCOUNTER — Ambulatory Visit: Admitting: Adult Health

## 2021-01-02 ENCOUNTER — Ambulatory Visit: Admitting: Adult Health

## 2021-01-24 ENCOUNTER — Encounter: Payer: Self-pay | Admitting: Adult Health

## 2021-01-24 ENCOUNTER — Other Ambulatory Visit: Payer: Self-pay

## 2021-01-24 ENCOUNTER — Ambulatory Visit (INDEPENDENT_AMBULATORY_CARE_PROVIDER_SITE_OTHER): Admitting: Adult Health

## 2021-01-24 VITALS — BP 138/72 | HR 77 | Temp 97.8°F | Wt 199.2 lb

## 2021-01-24 DIAGNOSIS — I10 Essential (primary) hypertension: Secondary | ICD-10-CM

## 2021-01-24 MED ORDER — LISINOPRIL 10 MG PO TABS: 10.0000 mg | ORAL_TABLET | Freq: Every day | ORAL | 0 refills | Status: DC

## 2021-01-24 NOTE — Progress Notes (Signed)
Subjective:    Patient ID: April Gonzalez, female    DOB: [birth year], 60 y.o.   MRN: [id number]  HPI 60 year old female who  has a past medical history of Allergy, Anemia (05/2012), Fibroids, GERD (gastroesophageal reflux disease), H/O uterine prolapse, History of blood transfusion, Hyperlipidemia, Hypertension, Rectal bleeding, Seizures ([place]), and Thyroid disorder.  She presents to the office today for 4-week follow-up regarding hypertension.  She was started on lisinopril 20 mg for available elevated blood pressure readings.  In the past she was on Hyzaar but stopped taking this at some point in time for an unknown reason.  She reports that she took lisinopril for about 4 days and stopped it due to nausea. She not had the medication in about 3 weeks.   BP Readings from Last 3 Encounters:  01/24/21 138/72  12/27/20 (!) 160/110  10/28/20 (!) 172/94   She is leaving to go to [place] for two months to see her daughter.   Review of Systems See HPI   Past Medical History:  Diagnosis Date   Allergy    Anemia 05/2012   transfusion   Fibroids    H/O myomectomy   GERD (gastroesophageal reflux disease)    rare    H/O uterine prolapse    History of blood transfusion    x 3    Hyperlipidemia    Hypertension    Rectal bleeding    NOTED AT pv 01-09-17- PT STATES with most BM's for the last 2 years, also hurts    Seizures ([place])    last seizure age 72 or 10 per pt report-01-09-17 no change in this   Thyroid disorder     Social History   Socioeconomic History   Marital status: Married    Spouse name: Not on file   Number of children: 2   Years of education: 15   Highest education level: Not on file  Occupational History   Not on file  Tobacco Use   Smoking status: Never   Smokeless tobacco: Never  Substance and Sexual Activity   Alcohol use: Yes    Alcohol/week: 0.0 standard drinks    Comment: rare   Drug use: No   Sexual activity: Yes    Birth control/protection: Surgical     Comment: BTL   Other Topics Concern   Not on file  Social History Narrative   She works for [employer]    Married   Has 2 children    Social Determinants of Health   Financial Resource Strain: Not on file  Food Insecurity: Not on file  Transportation Needs: Not on file  Physical Activity: Not on file  Stress: Not on file  Social Connections: Not on file  Intimate Partner Violence: Not on file    Past Surgical History:  Procedure Laterality Date   ANKLE SURGERY     One plate, seven screws in the right ankle    BLADDER SUSPENSION N/A 12/10/2012   Procedure: TRANSVAGINAL TAPE (TVT) PROCEDURE;  Surgeon: Delice Lesch, MD;  Location: [facility];  Service: Gynecology;  Laterality: N/A;   CYSTOCELE REPAIR N/A 12/10/2012   Procedure: ANTERIOR REPAIR (CYSTOCELE);  Surgeon: Eldred Manges, MD;  Location: [facility];  Service: Gynecology;  Laterality: N/A;   CYSTOSCOPY N/A 12/10/2012   Procedure: CYSTOSCOPY;  Surgeon: Delice Lesch, MD;  Location: [facility];  Service: Gynecology;  Laterality: N/A;   EXCISION VAGINAL CYST     MYOMECTOMY  40YRS AGO   HYSTEROSCOPIC  TUBAL LIGATION  10/10/1991   VAGINAL HYSTERECTOMY N/A 12/10/2012   Procedure: Total Vaginal Hysterectomy;  Surgeon: Eldred Manges, MD;  Location: [facility];  Service: Gynecology;  Laterality: N/A;    Family History  Problem Relation Age of Onset   Heart failure Father    Diabetes Mother    High blood pressure Mother    Arthritis Mother    Breast cancer Maternal Aunt        in her 63's    Breast cancer Cousin    Thyroid disease Sister    Colon cancer Neg Hx    Colon polyps Neg Hx    Esophageal cancer Neg Hx    Rectal cancer Neg Hx    Stomach cancer Neg Hx     Allergies  Allergen Reactions   Methimazole Anaphylaxis    EMS had to be called and a 7-day's stay in the hospital resulted   Nitroglycerin Other (See Comments)    Sweating    Other Itching and Other (See Comments)    Seasonal allergies & dog hair = runny nose  also   Tetracycline Hcl Hypertension   Promethazine Nausea Only    Made the patient feel like she was going to vomit and it "spaced" her out   Shrimp [Shellfish Allergy] Itching   Latex Rash   Penicillins Other (See Comments)    Has patient had a PCN reaction causing immediate rash, facial/tongue/throat swelling, SOB or lightheadedness with hypotension: Unk Has patient had a PCN reaction causing severe rash involving mucus membranes or skin necrosis: Unk Has patient had a PCN reaction that required hospitalization: Unk Has patient had a PCN reaction occurring within the last 10 years: Yes If all of the above answers are "NO", then may proceed with Cephalosporin use.    Tape Rash   Terbutaline Other (See Comments)    Reaction not recalled (??)    Current Outpatient Medications on File Prior to Visit  Medication Sig Dispense Refill   acetaminophen (TYLENOL) 500 MG tablet Take 500-1,000 mg by mouth every 6 (six) hours as needed for mild pain or headache.     EPINEPHrine (EPIPEN 2-PAK) 0.3 mg/0.3 mL IJ SOAJ injection Inject 0.3 mLs (0.3 mg total) into the muscle once for 1 dose. If you develop difficulty breathing 0.3 mL 0   lisinopril (ZESTRIL) 20 MG tablet Take 1 tablet (20 mg total) by mouth daily. 30 tablet 1   naproxen (NAPROSYN) 500 MG tablet Take 1 tablet (500 mg total) by mouth 2 (two) times daily as needed. 30 tablet 1   valACYclovir (VALTREX) 500 MG tablet Take 500 mg by mouth 2 (two) times daily.     No current facility-administered medications on file prior to visit.    LMP 12/03/2012       Objective:   Physical Exam Vitals and nursing note reviewed.  Constitutional:      Appearance: Normal appearance.  Cardiovascular:     Rate and Rhythm: Normal rate and regular rhythm.     Pulses: Normal pulses.     Heart sounds: Normal heart sounds.  Pulmonary:     Effort: Pulmonary effort is normal.     Breath sounds: Normal breath sounds.  Musculoskeletal:        General:  Normal range of motion.  Skin:    General: Skin is warm and dry.     Capillary Refill: Capillary refill takes less than 2 seconds.  Neurological:     General: No focal deficit present.  Mental Status: She is alert and oriented to person, place, and time.  Psychiatric:        Mood and Affect: Mood normal.        Behavior: Behavior normal.        Thought Content: Thought content normal.        Judgment: Judgment normal.       Assessment & Plan:  1. Essential hypertension - Will have her start taking the medication at night to see if she can sleep through the nausea until she gets used to it.  - Will decrease dose to 10 mg.  - Can follow up with me via Mychart while in [place]. Follow up when she returns  - lisinopril (ZESTRIL) 10 MG tablet; Take 1 tablet (10 mg total) by mouth daily.  Dispense: 90 tablet; Refill: 0  [name], NP

## 2021-01-26 ENCOUNTER — Ambulatory Visit (INDEPENDENT_AMBULATORY_CARE_PROVIDER_SITE_OTHER)

## 2021-01-26 ENCOUNTER — Other Ambulatory Visit: Payer: Self-pay

## 2021-01-26 DIAGNOSIS — Z23 Encounter for immunization: Secondary | ICD-10-CM

## 2021-01-29 ENCOUNTER — Telehealth: Payer: Self-pay | Admitting: Adult Health

## 2021-01-29 ENCOUNTER — Ambulatory Visit

## 2021-01-29 NOTE — Telephone Encounter (Signed)
PT called to state she is suppose to get her covid shot in 28 minutes but she had a reaction to a shot she got before and want to make sure it is safe for her to get it. Please advise.

## 2021-01-30 ENCOUNTER — Other Ambulatory Visit: Payer: Self-pay

## 2021-01-30 ENCOUNTER — Ambulatory Visit: Admitting: Adult Health

## 2021-01-30 ENCOUNTER — Encounter: Payer: Self-pay | Admitting: Adult Health

## 2021-01-30 ENCOUNTER — Ambulatory Visit (INDEPENDENT_AMBULATORY_CARE_PROVIDER_SITE_OTHER): Admitting: Adult Health

## 2021-01-30 VITALS — BP 138/84 | HR 84 | Temp 98.5°F | Ht 63.0 in | Wt 199.0 lb

## 2021-01-30 DIAGNOSIS — T50Z95A Adverse effect of other vaccines and biological substances, initial encounter: Secondary | ICD-10-CM

## 2021-01-30 MED ORDER — PREDNISONE 10 MG PO TABS: ORAL_TABLET | ORAL | 0 refills | Status: DC

## 2021-01-30 MED ORDER — DOXYCYCLINE HYCLATE 100 MG PO CAPS: 100.0000 mg | ORAL_CAPSULE | Freq: Two times a day (BID) | ORAL | 0 refills | Status: DC

## 2021-01-30 NOTE — Progress Notes (Signed)
Subjective:    Patient ID: April Gonzalez, female    DOB: [birth year], 60 y.o.   MRN: [id number]  HPI  60 year female who  has a past medical history of Allergy, Anemia (05/2012), Fibroids, GERD (gastroesophageal reflux disease), H/O uterine prolapse, History of blood transfusion, Hyperlipidemia, Hypertension, Rectal bleeding, Seizures ([place]), and Thyroid disorder.  She presents to the office today for possible vaccination reaction. She received her Shingles Vaccination 4 days ago. 2-3 hours after receiving the vaccination her left arm started to become red, warm, painful to touch and she noticed some swelling to the left arm.  The redness and warmth continued to spread throughout the left arm.  No longer painful but does itch significantly.  No fevers, chills, loss of range of motion of left arm, sOB or CP   Last night she did take Benadryl which seemed to help with her symptoms   Review of Systems See HPI   Past Medical History:  Diagnosis Date   Allergy    Anemia 05/2012   transfusion   Fibroids    H/O myomectomy   GERD (gastroesophageal reflux disease)    rare    H/O uterine prolapse    History of blood transfusion    x 3    Hyperlipidemia    Hypertension    Rectal bleeding    NOTED AT pv 01-09-17- PT STATES with most BM's for the last 2 years, also hurts    Seizures ([place])    last seizure age 68 or 10 per pt report-01-09-17 no change in this   Thyroid disorder     Social History   Socioeconomic History   Marital status: Married    Spouse name: Not on file   Number of children: 2   Years of education: 15   Highest education level: Not on file  Occupational History   Not on file  Tobacco Use   Smoking status: Never   Smokeless tobacco: Never  Substance and Sexual Activity   Alcohol use: Yes    Alcohol/week: 0.0 standard drinks    Comment: rare   Drug use: No   Sexual activity: Yes    Birth control/protection: Surgical    Comment: BTL   Other Topics Concern   Not  on file  Social History Narrative   She works for [employer]    Married   Has 2 children    Social Determinants of Health   Financial Resource Strain: Not on file  Food Insecurity: Not on file  Transportation Needs: Not on file  Physical Activity: Not on file  Stress: Not on file  Social Connections: Not on file  Intimate Partner Violence: Not on file    Past Surgical History:  Procedure Laterality Date   ANKLE SURGERY     One plate, seven screws in the right ankle    BLADDER SUSPENSION N/A 12/10/2012   Procedure: TRANSVAGINAL TAPE (TVT) PROCEDURE;  Surgeon: Delice Lesch, MD;  Location: [facility];  Service: Gynecology;  Laterality: N/A;   CYSTOCELE REPAIR N/A 12/10/2012   Procedure: ANTERIOR REPAIR (CYSTOCELE);  Surgeon: Eldred Manges, MD;  Location: [facility];  Service: Gynecology;  Laterality: N/A;   CYSTOSCOPY N/A 12/10/2012   Procedure: CYSTOSCOPY;  Surgeon: Delice Lesch, MD;  Location: [facility];  Service: Gynecology;  Laterality: N/A;   EXCISION VAGINAL CYST     MYOMECTOMY  26YRS AGO   HYSTEROSCOPIC   TUBAL LIGATION  10/10/1991   VAGINAL HYSTERECTOMY N/A 12/10/2012  Procedure: Total Vaginal Hysterectomy;  Surgeon: Eldred Manges, MD;  Location: [facility];  Service: Gynecology;  Laterality: N/A;    Family History  Problem Relation Age of Onset   Heart failure Father    Diabetes Mother    High blood pressure Mother    Arthritis Mother    Breast cancer Maternal Aunt        in her 23's    Breast cancer Cousin    Thyroid disease Sister    Colon cancer Neg Hx    Colon polyps Neg Hx    Esophageal cancer Neg Hx    Rectal cancer Neg Hx    Stomach cancer Neg Hx     Allergies  Allergen Reactions   Methimazole Anaphylaxis    EMS had to be called and a 7-day's stay in the hospital resulted   Nitroglycerin Other (See Comments)    Sweating    Other Itching and Other (See Comments)    Seasonal allergies & dog hair = runny nose also   Tetracycline Hcl Hypertension    Promethazine Nausea Only    Made the patient feel like she was going to vomit and it "spaced" her out   Shrimp [Shellfish Allergy] Itching   Latex Rash   Penicillins Other (See Comments)    Has patient had a PCN reaction causing immediate rash, facial/tongue/throat swelling, SOB or lightheadedness with hypotension: Unk Has patient had a PCN reaction causing severe rash involving mucus membranes or skin necrosis: Unk Has patient had a PCN reaction that required hospitalization: Unk Has patient had a PCN reaction occurring within the last 10 years: Yes If all of the above answers are "NO", then may proceed with Cephalosporin use.    Tape Rash   Terbutaline Other (See Comments)    Reaction not recalled (??)    Current Outpatient Medications on File Prior to Visit  Medication Sig Dispense Refill   acetaminophen (TYLENOL) 500 MG tablet Take 500-1,000 mg by mouth every 6 (six) hours as needed for mild pain or headache.     lisinopril (ZESTRIL) 10 MG tablet Take 1 tablet (10 mg total) by mouth daily. 90 tablet 0   EPINEPHrine (EPIPEN 2-PAK) 0.3 mg/0.3 mL IJ SOAJ injection Inject 0.3 mLs (0.3 mg total) into the muscle once for 1 dose. If you develop difficulty breathing 0.3 mL 0   No current facility-administered medications on file prior to visit.    BP 138/84   Pulse 84   Temp 98.5 F (36.9 C) (Oral)   Ht 5\' 3"  (1.6 m)   Wt 199 lb (90.3 kg)   LMP 12/03/2012   SpO2 99%   BMI 35.25 kg/m      Objective:   Physical Exam Vitals and nursing note reviewed.  Constitutional:      Appearance: Normal appearance.  Cardiovascular:     Rate and Rhythm: Normal rate and regular rhythm.     Pulses: Normal pulses.     Heart sounds: Normal heart sounds.  Pulmonary:     Effort: Pulmonary effort is normal.     Breath sounds: Normal breath sounds.  Skin:    General: Skin is warm and dry.     Findings: Erythema present.          Comments: Redness, warmth, and localized swelling noted    Neurological:     Mental Status: She is alert.       Assessment & Plan:  1. Vaccine reaction, initial encounter -We will start her  on prednisone for vaccination reaction.  Advised if no improvement in the next 2 to 3 days then start doxycycline.  No abscess felt today.  She is leaving for [place] later on tonight.  Encourage close monitoring, can go to urgent care if needed. - predniSONE (DELTASONE) 10 MG tablet; 40 mg x 3 days, 20 mg x 3 days, 10 mg x 3 days  Dispense: 21 tablet; Refill: 0 - doxycycline (VIBRAMYCIN) 100 MG capsule; Take 1 capsule (100 mg total) by mouth 2 (two) times daily.  Dispense: 14 capsule; Refill: 0  [name], NP

## 2021-01-30 NOTE — Telephone Encounter (Signed)
Pt has been schedule.

## 2021-01-30 NOTE — Telephone Encounter (Signed)
Spoke to pt and she stated that her arm is swollen from the Shingrix vaccine. Pt stated that her arm is swollen from the elbow up to the shoulder. I advised pt that I will talk to pt to see what we need to do.

## 2021-01-30 NOTE — Patient Instructions (Signed)
I am going to send in two prescriptions   Start the prednisone and if no improvement in the next 2-3 days  then start the Doxycycline

## 2021-02-21 ENCOUNTER — Other Ambulatory Visit: Payer: Self-pay | Admitting: Adult Health

## 2021-02-21 DIAGNOSIS — I10 Essential (primary) hypertension: Secondary | ICD-10-CM

## 2021-04-12 ENCOUNTER — Telehealth: Payer: Self-pay | Admitting: Adult Health

## 2021-04-12 NOTE — Telephone Encounter (Incomplete Revision)
Pt is calling and said cory told her if she needed anything to call him. Pt is still in [place] and did go to er  over a month ago for right ear pain. Pt was given abx and does not remember the name and does not have the bottle .   [place] [place] [address] Phone:  [phone]  Fax:  [phone]    Pt is aware she may need to go to urgent care however I told her I will send message to cory for advice

## 2021-04-12 NOTE — Telephone Encounter (Addendum)
Pt is calling and said cory told her if she needed anything to call him. Pt is still in [place] and did go to er  over a month ago for right ear pain. Pt was given abx and does not remember the name and does not have the bottle .   [place], [address] [place] [address] Phone:  [phone]  Fax:  [phone]    Pt is aware she may need to go to urgent care however I told her I will send message to cory for advice

## 2021-04-12 NOTE — Telephone Encounter (Signed)
---  Caller states she has right earache and it's affecting her throat now. Caller states she's currently in [place] and was told to call if anything goes wrong. Was seen in ER in [place] about a month ago and was given antibiotic. Was told she needs to be seen when she gets back home. Has had re-occurring ear infections. Would like to speak to office about possible medication, declined triage at this time. This nurse transferred patient to the office.

## 2021-06-05 ENCOUNTER — Ambulatory Visit: Admitting: Adult Health

## 2021-06-06 ENCOUNTER — Other Ambulatory Visit: Payer: Self-pay

## 2021-06-06 ENCOUNTER — Ambulatory Visit (INDEPENDENT_AMBULATORY_CARE_PROVIDER_SITE_OTHER)

## 2021-06-06 ENCOUNTER — Encounter: Payer: Self-pay | Admitting: Adult Health

## 2021-06-06 ENCOUNTER — Ambulatory Visit (INDEPENDENT_AMBULATORY_CARE_PROVIDER_SITE_OTHER): Admitting: Adult Health

## 2021-06-06 VITALS — BP 120/80 | HR 85 | Temp 98.8°F | Ht 63.0 in | Wt 184.0 lb

## 2021-06-06 DIAGNOSIS — R053 Chronic cough: Secondary | ICD-10-CM

## 2021-06-06 DIAGNOSIS — M79672 Pain in left foot: Secondary | ICD-10-CM

## 2021-06-06 DIAGNOSIS — M79671 Pain in right foot: Secondary | ICD-10-CM

## 2021-06-06 MED ORDER — PANTOPRAZOLE SODIUM 40 MG PO TBEC
40.0000 mg | DELAYED_RELEASE_TABLET | Freq: Every day | ORAL | 0 refills | Status: DC
Start: 2021-06-06 — End: 2021-06-27

## 2021-06-06 NOTE — Progress Notes (Signed)
Subjective:    Patient ID: April Gonzalez, female    DOB: 1960/07/14, 61 y.o.   MRN: [id number]  HPI 61 year old female who  has a past medical history of Allergy, Anemia (05/2012), Fibroids, GERD (gastroesophageal reflux disease), H/O uterine prolapse, History of blood transfusion, Hyperlipidemia, Hypertension, Rectal bleeding, Seizures ([place]), and Thyroid disorder.  She presents to the office today for two separate issues.   Her first issue is that of a " dull sharp pain" across the top of both of her feet. Pain is constant but walking makes it worse. Denies trauma or injury. Has not noticed any bruising.  She was in [place] for few months and did a lot of walking.  Additionally, she complains of a chronic dry cough that is worse with laying down.  Feels as though the cough has been present intermittently over the last 2 years.  Has tried over-the-counter medications without improvement.  Does have intermittent episodes of acid reflux.  Last chest x-ray in July 2021 showed no evidence of active cardiopulmonary disease.  Mild scarring within the right lung base.   Review of Systems See HPI   Past Medical History:  Diagnosis Date   Allergy    Anemia 05/2012   transfusion   Fibroids    H/O myomectomy   GERD (gastroesophageal reflux disease)    rare    H/O uterine prolapse    History of blood transfusion    x 3    Hyperlipidemia    Hypertension    Rectal bleeding    NOTED AT pv 01-09-17- PT STATES with most BM's for the last 2 years, also hurts    Seizures ([place])    last seizure age 74 or 10 per pt report-01-09-17 no change in this   Thyroid disorder     Social History   Socioeconomic History   Marital status: Married    Spouse name: Not on file   Number of children: 2   Years of education: 15   Highest education level: Not on file  Occupational History   Not on file  Tobacco Use   Smoking status: Never   Smokeless tobacco: Never  Substance and Sexual Activity   Alcohol use:  Yes    Alcohol/week: 0.0 standard drinks    Comment: rare   Drug use: No   Sexual activity: Yes    Birth control/protection: Surgical    Comment: BTL   Other Topics Concern   Not on file  Social History Narrative   She works for [employer]    Married   Has 2 children    Social Determinants of Health   Financial Resource Strain: Not on file  Food Insecurity: Not on file  Transportation Needs: Not on file  Physical Activity: Not on file  Stress: Not on file  Social Connections: Not on file  Intimate Partner Violence: Not on file    Past Surgical History:  Procedure Laterality Date   ANKLE SURGERY     One plate, seven screws in the right ankle    BLADDER SUSPENSION N/A 12/10/2012   Procedure: TRANSVAGINAL TAPE (TVT) PROCEDURE;  Surgeon: Delice Lesch, MD;  Location: [facility];  Service: Gynecology;  Laterality: N/A;   CYSTOCELE REPAIR N/A 12/10/2012   Procedure: ANTERIOR REPAIR (CYSTOCELE);  Surgeon: Eldred Manges, MD;  Location: [facility];  Service: Gynecology;  Laterality: N/A;   CYSTOSCOPY N/A 12/10/2012   Procedure: CYSTOSCOPY;  Surgeon: Delice Lesch, MD;  Location: [facility];  Service: Gynecology;  Laterality: N/A;   EXCISION VAGINAL CYST     MYOMECTOMY  29YRS AGO   HYSTEROSCOPIC   TUBAL LIGATION  10/10/1991   VAGINAL HYSTERECTOMY N/A 12/10/2012   Procedure: Total Vaginal Hysterectomy;  Surgeon: Eldred Manges, MD;  Location: [facility];  Service: Gynecology;  Laterality: N/A;    Family History  Problem Relation Age of Onset   Heart failure Father    Diabetes Mother    High blood pressure Mother    Arthritis Mother    Breast cancer Maternal Aunt        in her 14's    Breast cancer Cousin    Thyroid disease Sister    Colon cancer Neg Hx    Colon polyps Neg Hx    Esophageal cancer Neg Hx    Rectal cancer Neg Hx    Stomach cancer Neg Hx     Allergies  Allergen Reactions   Methimazole Anaphylaxis    EMS had to be called and a 7-day's stay in the hospital resulted    Nitroglycerin Other (See Comments)    Sweating    Other Itching and Other (See Comments)    Seasonal allergies & dog hair = runny nose also   Tetracycline Hcl Hypertension   Promethazine Nausea Only    Made the patient feel like she was going to vomit and it "spaced" her out   Shrimp [Shellfish Allergy] Itching   Latex Rash   Penicillins Other (See Comments)    Has patient had a PCN reaction causing immediate rash, facial/tongue/throat swelling, SOB or lightheadedness with hypotension: Unk Has patient had a PCN reaction causing severe rash involving mucus membranes or skin necrosis: Unk Has patient had a PCN reaction that required hospitalization: Unk Has patient had a PCN reaction occurring within the last 10 years: Yes If all of the above answers are "NO", then may proceed with Cephalosporin use.    Tape Rash   Terbutaline Other (See Comments)    Reaction not recalled (??)    Current Outpatient Medications on File Prior to Visit  Medication Sig Dispense Refill   acetaminophen (TYLENOL) 500 MG tablet Take 500-1,000 mg by mouth every 6 (six) hours as needed for mild pain or headache.     doxycycline (VIBRAMYCIN) 100 MG capsule Take 1 capsule (100 mg total) by mouth 2 (two) times daily. 14 capsule 0   lisinopril (ZESTRIL) 20 MG tablet TAKE ONE TABLET BY MOUTH DAILY 60 tablet 0   predniSONE (DELTASONE) 10 MG tablet 40 mg x 3 days, 20 mg x 3 days, 10 mg x 3 days 21 tablet 0   EPINEPHrine (EPIPEN 2-PAK) 0.3 mg/0.3 mL IJ SOAJ injection Inject 0.3 mLs (0.3 mg total) into the muscle once for 1 dose. If you develop difficulty breathing 0.3 mL 0   lisinopril (ZESTRIL) 10 MG tablet Take 1 tablet (10 mg total) by mouth daily. 90 tablet 0   No current facility-administered medications on file prior to visit.    BP 120/80    Pulse 85    Temp 98.8 F (37.1 C) (Oral)    Ht 5\' 3"  (1.6 m)    Wt 184 lb (83.5 kg)    LMP 12/03/2012    SpO2 95%    BMI 32.59 kg/m       Objective:   Physical  Exam Vitals and nursing note reviewed.  Constitutional:      Appearance: Normal appearance.  Cardiovascular:     Rate and Rhythm:  Normal rate and regular rhythm.     Pulses: Normal pulses.     Heart sounds: Normal heart sounds.  Pulmonary:     Effort: Pulmonary effort is normal.     Breath sounds: Normal breath sounds.  Abdominal:     General: Abdomen is flat.     Palpations: Abdomen is soft.     Tenderness: There is abdominal tenderness in the epigastric area and left upper quadrant.  Musculoskeletal:        General: Tenderness present. No swelling, deformity or signs of injury. Normal range of motion.       Legs:     Comments: Pain across the dorsal aspect of both feet, posterior to the toes.  No redness, warmth, swelling, or signs of trauma noted.  No pain with flexion or dorsiflexion.  Skin:    General: Skin is warm and dry.  Neurological:     General: No focal deficit present.     Mental Status: She is alert and oriented to person, place, and time.  Psychiatric:        Mood and Affect: Mood normal.        Behavior: Behavior normal.        Thought Content: Thought content normal.        Judgment: Judgment normal.      Assessment & Plan:  1. Right foot pain -Possible overuse injury.  We will check x-rays of both feet - DG Foot Complete Right; Future  2. Left foot pain  - DG Foot Complete Left; Future  3. Chronic cough -Possibly silent acid reflux.  We will try her on Protonix 40 mg daily for 30 days.  Follow-up if not improving - pantoprazole (PROTONIX) 40 MG tablet; Take 1 tablet (40 mg total) by mouth daily.  Dispense: 90 tablet; Refill: 0  [name], NP

## 2021-06-21 ENCOUNTER — Ambulatory Visit (INDEPENDENT_AMBULATORY_CARE_PROVIDER_SITE_OTHER): Admitting: Adult Health

## 2021-06-21 VITALS — BP 140/100 | HR 94 | Temp 98.5°F | Ht 63.0 in | Wt 185.0 lb

## 2021-06-21 DIAGNOSIS — K2100 Gastro-esophageal reflux disease with esophagitis, without bleeding: Secondary | ICD-10-CM | POA: Diagnosis not present

## 2021-06-21 DIAGNOSIS — R1084 Generalized abdominal pain: Secondary | ICD-10-CM | POA: Diagnosis not present

## 2021-06-21 MED ORDER — SUCRALFATE 1 G PO TABS: 1.0000 g | ORAL_TABLET | Freq: Three times a day (TID) | ORAL | 0 refills | Status: DC

## 2021-06-21 MED ORDER — TRIAMCINOLONE ACETONIDE 0.1 % EX CREA: 1.0000 "application " | TOPICAL_CREAM | Freq: Two times a day (BID) | CUTANEOUS | 0 refills | Status: DC

## 2021-06-21 NOTE — Progress Notes (Signed)
Subjective:    Patient ID: April Gonzalez, female    DOB: 06-06-1960, 61 y.o.   MRN: [id number]  HPI 61 year old female who  has a past medical history of Allergy, Anemia (05/2012), Fibroids, GERD (gastroesophageal reflux disease), H/O uterine prolapse, History of blood transfusion, Hyperlipidemia, Hypertension, Rectal bleeding, Seizures ([place]), and Thyroid disorder.  She presents to the office today for an acute issue of abdominal pain and noticing " white specs in my poop"   She reports two to three months of alternating stool color from light tan to dark brown with white specs that look like " grits". She denies burning or itching around her anus. Has not eaten any raw meats or under cooked meats. Does not eat nuts, rice, grits, or quina or corn.   She is taking her Protonix but does not feel like this helps with her her acid reflux like symptoms. She continues to have severe abdominal pain after eating ( eating bland diet) lesser constant pain, as well as nausea and episodes of vomiting.   Has not noticed any blood in stool    Review of Systems See HPI   Past Medical History:  Diagnosis Date   Allergy    Anemia 05/2012   transfusion   Fibroids    H/O myomectomy   GERD (gastroesophageal reflux disease)    rare    H/O uterine prolapse    History of blood transfusion    x 3    Hyperlipidemia    Hypertension    Rectal bleeding    NOTED AT pv 01-09-17- PT STATES with most BM's for the last 2 years, also hurts    Seizures ([place])    last seizure age 31 or 10 per pt report-01-09-17 no change in this   Thyroid disorder     Social History   Socioeconomic History   Marital status: Married    Spouse name: Not on file   Number of children: 2   Years of education: 15   Highest education level: Not on file  Occupational History   Not on file  Tobacco Use   Smoking status: Never   Smokeless tobacco: Never  Substance and Sexual Activity   Alcohol use: Yes    Alcohol/week: 0.0 standard  drinks    Comment: rare   Drug use: No   Sexual activity: Yes    Birth control/protection: Surgical    Comment: BTL   Other Topics Concern   Not on file  Social History Narrative   She works for [employer]    Married   Has 2 children    Social Determinants of Health   Financial Resource Strain: Not on file  Food Insecurity: Not on file  Transportation Needs: Not on file  Physical Activity: Not on file  Stress: Not on file  Social Connections: Not on file  Intimate Partner Violence: Not on file    Past Surgical History:  Procedure Laterality Date   ANKLE SURGERY     One plate, seven screws in the right ankle    BLADDER SUSPENSION N/A 12/10/2012   Procedure: TRANSVAGINAL TAPE (TVT) PROCEDURE;  Surgeon: Delice Lesch, MD;  Location: [facility];  Service: Gynecology;  Laterality: N/A;   CYSTOCELE REPAIR N/A 12/10/2012   Procedure: ANTERIOR REPAIR (CYSTOCELE);  Surgeon: Eldred Manges, MD;  Location: [facility];  Service: Gynecology;  Laterality: N/A;   CYSTOSCOPY N/A 12/10/2012   Procedure: CYSTOSCOPY;  Surgeon: Delice Lesch, MD;  Location: [facility];  Service: Gynecology;  Laterality: N/A;   EXCISION VAGINAL CYST     MYOMECTOMY  45YRS AGO   HYSTEROSCOPIC   TUBAL LIGATION  10/10/1991   VAGINAL HYSTERECTOMY N/A 12/10/2012   Procedure: Total Vaginal Hysterectomy;  Surgeon: Eldred Manges, MD;  Location: [facility];  Service: Gynecology;  Laterality: N/A;    Family History  Problem Relation Age of Onset   Heart failure Father    Diabetes Mother    High blood pressure Mother    Arthritis Mother    Breast cancer Maternal Aunt        in her 71's    Breast cancer Cousin    Thyroid disease Sister    Colon cancer Neg Hx    Colon polyps Neg Hx    Esophageal cancer Neg Hx    Rectal cancer Neg Hx    Stomach cancer Neg Hx     Allergies  Allergen Reactions   Methimazole Anaphylaxis    EMS had to be called and a 7-day's stay in the hospital resulted   Nitroglycerin Other (See  Comments)    Sweating    Other Itching and Other (See Comments)    Seasonal allergies & dog hair = runny nose also   Tetracycline Hcl Hypertension   Promethazine Nausea Only    Made the patient feel like she was going to vomit and it "spaced" her out   Shrimp [Shellfish Allergy] Itching   Latex Rash   Penicillins Other (See Comments)    Has patient had a PCN reaction causing immediate rash, facial/tongue/throat swelling, SOB or lightheadedness with hypotension: Unk Has patient had a PCN reaction causing severe rash involving mucus membranes or skin necrosis: Unk Has patient had a PCN reaction that required hospitalization: Unk Has patient had a PCN reaction occurring within the last 10 years: Yes If all of the above answers are "NO", then may proceed with Cephalosporin use.    Tape Rash   Terbutaline Other (See Comments)    Reaction not recalled (??)    Current Outpatient Medications on File Prior to Visit  Medication Sig Dispense Refill   acetaminophen (TYLENOL) 500 MG tablet Take 500-1,000 mg by mouth every 6 (six) hours as needed for mild pain or headache.     doxycycline (VIBRAMYCIN) 100 MG capsule Take 1 capsule (100 mg total) by mouth 2 (two) times daily. 14 capsule 0   lisinopril (ZESTRIL) 20 MG tablet TAKE ONE TABLET BY MOUTH DAILY 60 tablet 0   pantoprazole (PROTONIX) 40 MG tablet Take 1 tablet (40 mg total) by mouth daily. 90 tablet 0   predniSONE (DELTASONE) 10 MG tablet 40 mg x 3 days, 20 mg x 3 days, 10 mg x 3 days 21 tablet 0   EPINEPHrine (EPIPEN 2-PAK) 0.3 mg/0.3 mL IJ SOAJ injection Inject 0.3 mLs (0.3 mg total) into the muscle once for 1 dose. If you develop difficulty breathing 0.3 mL 0   lisinopril (ZESTRIL) 10 MG tablet Take 1 tablet (10 mg total) by mouth daily. 90 tablet 0   No current facility-administered medications on file prior to visit.    LMP 12/03/2012       Objective:   Physical Exam Vitals and nursing note reviewed.  Constitutional:       Appearance: Normal appearance.  Cardiovascular:     Rate and Rhythm: Normal rate and regular rhythm.     Pulses: Normal pulses.     Heart sounds: Normal heart sounds.  Pulmonary:  Effort: Pulmonary effort is normal.     Breath sounds: Normal breath sounds.  Abdominal:     General: Abdomen is flat. Bowel sounds are normal.     Palpations: Abdomen is soft. There is no mass.     Tenderness: There is generalized abdominal tenderness. There is no right CVA tenderness, left CVA tenderness or rebound.  Neurological:     Mental Status: She is alert.      Assessment & Plan:  1. Gastroesophageal reflux disease with esophagitis without hemorrhage - I think a lot of her symptoms are coming from uncontrolled GERD. The white specs in her stool are likely undigested food. She is tender throughout her abdomen but mostly in epigastric area. Will get CT abdomen, referral to GI, and start on Carafate.  - Stool culture - Ova and Parasite Exam - CT Abdomen Pelvis W Contrast; Future - Basic Metabolic Panel - Ambulatory referral to Gastroenterology - sucralfate (CARAFATE) 1 g tablet; Take 1 tablet (1 g total) by mouth 4 (four) times daily -  with meals and at bedtime.  Dispense: 120 tablet; Refill: 0 - CBC with Differential/Platelet; Future  2. Generalized abdominal pain  - Stool culture - Ova and Parasite Exam - CT Abdomen Pelvis W Contrast; Future - Basic Metabolic Panel - Ambulatory referral to Gastroenterology - sucralfate (CARAFATE) 1 g tablet; Take 1 tablet (1 g total) by mouth 4 (four) times daily -  with meals and at bedtime.  Dispense: 120 tablet; Refill: 0 - CBC with Differential/Platelet; Future  [name], NP  Time spent on chart review, time with patient; discussion of GERD, PUD, abnormal stools,  home monitoring , and treatment, follow up plan, and documentation 30 minutes

## 2021-06-22 ENCOUNTER — Other Ambulatory Visit (INDEPENDENT_AMBULATORY_CARE_PROVIDER_SITE_OTHER)

## 2021-06-22 DIAGNOSIS — R1084 Generalized abdominal pain: Secondary | ICD-10-CM | POA: Diagnosis not present

## 2021-06-22 DIAGNOSIS — K2100 Gastro-esophageal reflux disease with esophagitis, without bleeding: Secondary | ICD-10-CM | POA: Diagnosis not present

## 2021-06-22 LAB — CBC WITH DIFFERENTIAL/PLATELET
Basophils Absolute: 0 10*3/uL (ref 0.0–0.1)
Basophils Relative: 0.2 % (ref 0.0–3.0)
Eosinophils Absolute: 0.2 10*3/uL (ref 0.0–0.7)
Eosinophils Relative: 3.9 % (ref 0.0–5.0)
HCT: 44 % (ref 36.0–46.0)
Hemoglobin: 14.4 g/dL (ref 12.0–15.0)
Lymphocytes Relative: 28.9 % (ref 12.0–46.0)
Lymphs Abs: 1.7 10*3/uL (ref 0.7–4.0)
MCHC: 32.8 g/dL (ref 30.0–36.0)
MCV: 86.6 fl (ref 78.0–100.0)
Monocytes Absolute: 0.4 10*3/uL (ref 0.1–1.0)
Monocytes Relative: 7.3 % (ref 3.0–12.0)
Neutro Abs: 3.5 10*3/uL (ref 1.4–7.7)
Neutrophils Relative %: 59.7 % (ref 43.0–77.0)
Platelets: 198 10*3/uL (ref 150.0–400.0)
RBC: 5.09 Mil/uL (ref 3.87–5.11)
RDW: 13.2 % (ref 11.5–15.5)
WBC: 5.8 10*3/uL (ref 4.0–10.5)

## 2021-06-27 ENCOUNTER — Other Ambulatory Visit (INDEPENDENT_AMBULATORY_CARE_PROVIDER_SITE_OTHER)

## 2021-06-27 ENCOUNTER — Encounter: Payer: Self-pay | Admitting: Gastroenterology

## 2021-06-27 ENCOUNTER — Ambulatory Visit (INDEPENDENT_AMBULATORY_CARE_PROVIDER_SITE_OTHER): Admitting: Gastroenterology

## 2021-06-27 VITALS — BP 128/98 | HR 77 | Ht 63.0 in | Wt 185.0 lb

## 2021-06-27 DIAGNOSIS — R110 Nausea: Secondary | ICD-10-CM

## 2021-06-27 DIAGNOSIS — R1319 Other dysphagia: Secondary | ICD-10-CM

## 2021-06-27 DIAGNOSIS — R1084 Generalized abdominal pain: Secondary | ICD-10-CM

## 2021-06-27 DIAGNOSIS — R194 Change in bowel habit: Secondary | ICD-10-CM

## 2021-06-27 LAB — COMPREHENSIVE METABOLIC PANEL
ALT: 14 U/L (ref 0–35)
AST: 13 U/L (ref 0–37)
Albumin: 4.5 g/dL (ref 3.5–5.2)
Alkaline Phosphatase: 95 U/L (ref 39–117)
BUN: 11 mg/dL (ref 6–23)
CO2: 31 mEq/L (ref 19–32)
Calcium: 10 mg/dL (ref 8.4–10.5)
Chloride: 103 mEq/L (ref 96–112)
Creatinine, Ser: 0.82 mg/dL (ref 0.40–1.20)
GFR: 78.49 mL/min (ref >60.00)
Glucose, Bld: 87 mg/dL (ref 70–99)
Potassium: 4.4 mEq/L (ref 3.5–5.1)
Sodium: 137 mEq/L (ref 135–145)
Total Bilirubin: 0.6 mg/dL (ref 0.2–1.2)
Total Protein: 7.9 g/dL (ref 6.0–8.3)

## 2021-06-27 LAB — SEDIMENTATION RATE: Sed Rate: 24 mm/hr (ref 0–30)

## 2021-06-27 LAB — H. PYLORI ANTIBODY, IGG: H Pylori IgG: NEGATIVE

## 2021-06-27 MED ORDER — ONDANSETRON HCL 4 MG PO TABS: 4.0000 mg | ORAL_TABLET | Freq: Three times a day (TID) | ORAL | 1 refills | Status: DC | PRN

## 2021-06-27 MED ORDER — OMEPRAZOLE 40 MG PO CPDR: 40.0000 mg | DELAYED_RELEASE_CAPSULE | Freq: Every day | ORAL | 1 refills | Status: DC

## 2021-06-27 NOTE — Progress Notes (Signed)
[facility] Consult Note:  History: April Gonzalez 06/27/2021  Referring provider: [name], NP  Reason for consult/chief complaint: Nausea (All the time, does not vomit), Abdominal Pain (Mid to lower abd pain. Patient states when she has the pain it is so intense she feel like passing out.), and Change in bowel habits (Patient states when she eats she has to use the bathroom right away, describes stool as pasty.)   Subjective  HPI: First screening colonoscopy with me 01/22/2017: Complete exam, excellent prep, diminutive TA and diminutive SSP removed, 5-year recall recommended. She was seen in clinic about a month later with a single episode of rectal bleeding felt to been benign, possibly hemorrhoids (no significant hemorrhoids described on colonoscopy however).  She also describes years of chronic abdominal pain with alternating diarrhea and constipation.  Primary care visit 06/21/2021 noting the following history: "She presents to the office today for an acute issue of abdominal pain and noticing " white specs in my poop"    She reports two to three months of alternating stool color from light tan to dark brown with white specs that look like " grits". She denies burning or itching around her anus. Has not eaten any raw meats or under cooked meats. Does not eat nuts, rice, grits, or quina or corn.    She is taking her Protonix but does not feel like this helps with her her acid reflux like symptoms. She continues to have severe abdominal pain after eating ( eating bland diet) lesser constant pain, as well as nausea and episodes of vomiting.    Has not noticed any blood in stool "  She had stool studies, labs, CT abdomen and pelvis ordered and was prescribed sucralfate. ____________  April Gonzalez describes about a year of fairly generalized abdominal pain with episodes of acute onset sharp mid to lower severe abdominal pain that stops her in her tracks, can occur  anytime of the day and usually lasts a minute or less.  She had 1 episode during the visit, and it makes her feel quite nauseated without vomiting.  Pain is sometimes worse after meals, she tends to have 2-3 BMs per day soon after eating.  Sometimes food feels slow to pass through the esophagus, and even just drinking water causes a sensitivity where she can feel it passing into the stomach.  Her stools are typically pasty consistency with some white flecks lately and no bleeding.  She lost about 17 pounds a couple of months ago, but attributes that to some diet changes while she was living with her daughter in [place], and weight has since stabilized.  ROS:  Review of Systems  Constitutional:  Negative for appetite change and unexpected weight change.  HENT:  Negative for mouth sores and voice change.   Eyes:  Negative for pain and redness.  Respiratory:  Negative for cough and shortness of breath.   Cardiovascular:  Negative for chest pain and palpitations.  Genitourinary:  Negative for dysuria and hematuria.  Musculoskeletal:  Positive for arthralgias and back pain. Negative for myalgias.  Skin:  Negative for pallor and rash.  Neurological:  Negative for weakness and headaches.  Hematological:  Negative for adenopathy.   Chronic pain in her lower legs most of the time, says it always hurts to touch the shins. Rash on knuckles of both hands that she has been told is eczema and she received some cream that helped it somewhat. She is also been losing her hair, and showed  me at the visit today how strands come out if she runs her hand through her hair.  Past Medical History: Past Medical History:  Diagnosis Date   Allergy    Anemia 05/2012   transfusion   Fibroids    H/O myomectomy   GERD (gastroesophageal reflux disease)    rare    H/O uterine prolapse    History of blood transfusion    x 3    Hyperlipidemia    Hypertension    Rectal bleeding    NOTED AT pv 01-09-17- PT STATES with  most BM's for the last 2 years, also hurts    Seizures ([place])    last seizure age 44 or 10 per pt report-01-09-17 no change in this   Thyroid disorder      Past Surgical History: Past Surgical History:  Procedure Laterality Date   ANKLE SURGERY     One plate, seven screws in the right ankle    BLADDER SUSPENSION N/A 12/10/2012   Procedure: TRANSVAGINAL TAPE (TVT) PROCEDURE;  Surgeon: April Lesch, MD;  Location: [facility];  Service: Gynecology;  Laterality: N/A;   CYSTOCELE REPAIR N/A 12/10/2012   Procedure: ANTERIOR REPAIR (CYSTOCELE);  Surgeon: April Manges, MD;  Location: [facility];  Service: Gynecology;  Laterality: N/A;   CYSTOSCOPY N/A 12/10/2012   Procedure: CYSTOSCOPY;  Surgeon: April Lesch, MD;  Location: [facility];  Service: Gynecology;  Laterality: N/A;   EXCISION VAGINAL CYST     MYOMECTOMY  48YRS AGO   HYSTEROSCOPIC   TUBAL LIGATION  10/10/1991   VAGINAL HYSTERECTOMY N/A 12/10/2012   Procedure: Total Vaginal Hysterectomy;  Surgeon: April Manges, MD;  Location: [facility];  Service: Gynecology;  Laterality: N/A;     Family History: Family History  Problem Relation Age of Onset   Heart failure Father    Diabetes Mother    High blood pressure Mother    Arthritis Mother    Breast cancer Maternal Aunt        in her 62's    Breast cancer Cousin    Thyroid disease Sister    Colon cancer Neg Hx    Colon polyps Neg Hx    Esophageal cancer Neg Hx    Rectal cancer Neg Hx    Stomach cancer Neg Hx     Social History: Social History   Socioeconomic History   Marital status: Married    Spouse name: Not on file   Number of children: 2   Years of education: 15   Highest education level: Not on file  Occupational History   Not on file  Tobacco Use   Smoking status: Never   Smokeless tobacco: Never  Substance and Sexual Activity   Alcohol use: Yes    Alcohol/week: 0.0 standard drinks    Comment: rare   Drug use: No   Sexual activity: Yes    Birth control/protection:  Surgical    Comment: BTL   Other Topics Concern   Not on file  Social History Narrative   She works for [employer]    Married   Has 2 children    Social Determinants of Radio broadcast assistant Strain: Not on file  Food Insecurity: Not on file  Transportation Needs: Not on file  Physical Activity: Not on file  Stress: Not on file  Social Connections: Not on file    Allergies: Allergies  Allergen Reactions   Methimazole Anaphylaxis    EMS had to be called  and a 7-day's stay in the hospital resulted   Nitroglycerin Other (See Comments)    Sweating    Other Itching and Other (See Comments)    Seasonal allergies & dog hair = runny nose also   Tetracycline Hcl Hypertension   Promethazine Nausea Only    Made the patient feel like she was going to vomit and it "spaced" her out   Shrimp [Shellfish Allergy] Itching   Latex Rash   Penicillins Other (See Comments)    Has patient had a PCN reaction causing immediate rash, facial/tongue/throat swelling, SOB or lightheadedness with hypotension: Unk Has patient had a PCN reaction causing severe rash involving mucus membranes or skin necrosis: Unk Has patient had a PCN reaction that required hospitalization: Unk Has patient had a PCN reaction occurring within the last 10 years: Yes If all of the above answers are "NO", then may proceed with Cephalosporin use.    Tape Rash   Terbutaline Other (See Comments)    Reaction not recalled (??)    Outpatient Meds: Current Outpatient Medications  Medication Sig Dispense Refill   acetaminophen (TYLENOL) 500 MG tablet Take 500-1,000 mg by mouth every 6 (six) hours as needed for mild pain or headache.     doxycycline (VIBRAMYCIN) 100 MG capsule Take 1 capsule (100 mg total) by mouth 2 (two) times daily. 14 capsule 0   lisinopril (ZESTRIL) 20 MG tablet TAKE ONE TABLET BY MOUTH DAILY 60 tablet 0   triamcinolone cream (KENALOG) 0.1 % Apply 1 application topically 2 (two) times daily. 30 g 0    EPINEPHrine (EPIPEN 2-PAK) 0.3 mg/0.3 mL IJ SOAJ injection Inject 0.3 mLs (0.3 mg total) into the muscle once for 1 dose. If you develop difficulty breathing 0.3 mL 0   No current facility-administered medications for this visit.      ___________________________________________________________________ Objective   Exam:  BP (!) 128/98    Pulse 77    Ht $R'5\' 3"'OY$  (1.6 m)    Wt 185 lb (83.9 kg)    LMP 12/03/2012    SpO2 95%    BMI 32.77 kg/m  Wt Readings from Last 3 Encounters:  06/27/21 185 lb (83.9 kg)  06/21/21 185 lb (83.9 kg)  06/06/21 184 lb (83.5 kg)    General: She is ambulatory, alert and conversational and pleasant.  No acute distress. Eyes: sclera anicteric, no redness ENT: oral mucosa moist without lesions, no cervical or supraclavicular lymphadenopathy CV: RRR without murmur, S1/S2, no JVD, no peripheral edema Resp: clear to auscultation bilaterally, normal RR and effort noted GI: soft, generalized tenderness, more so to light palpation of the entire lower abdominal wall, with active bowel sounds. No guarding or palpable organomegaly noted. Skin; warm and dry, no rash or jaundice noted.  Flat darkened discoloration extensor surface PIP joints both hands Neuro: awake, alert and oriented x 3. Normal gross motor function and fluent speech She is also sensitive to light palpation of the shins bilaterally Labs:  CBC Latest Ref Rng & Units 06/22/2021 06/17/2020 12/03/2019  WBC 4.0 - 10.5 K/uL 5.8 5.9 7.2  Hemoglobin 12.0 - 15.0 g/dL 14.4 14.7 14.8  Hematocrit 36.0 - 46.0 % 44.0 44.0 45.7  Platelets 150.0 - 400.0 K/uL 198.0 209 228   Normal TSH  No stool study results -says she did not receive any collection materials at the lab  Radiologic Studies:  No recent reports.  CTAP not yet performed.  (Says she has not heard about the schedule yet)  Assessment: Encounter Diagnoses  Name Primary?   Generalized abdominal pain Yes   Nausea in adult    Change in bowel habits     Esophageal dysphagia     At least a year of abdominal pain, fairly generalized and constant but with episodic sharp pain primarily in the lower abdomen with associated nausea.  Few BMs per day, often triggered by meals, not loose or watery stool and no bleeding. She has other arthritic and chronic pain symptoms and has also been losing some hair.  Weight loss may have been diet related and has reportedly stabilized for at least the last month. Upper digestive symptoms with some elements that may be reflux, but then episodic nausea associated with this abdominal pain.  Not yet clear how this all fits together.  Concern for some chronic inflammatory, less likely infectious cause. Previous pelvic surgery, no abdominal surgery must consider adhesions in partial obstructive symptoms. Plan:  CT abdomen and pelvis with oral and IV contrast -our office has made the arrangement  Stool for ova and parasite  Omeprazole 40 mg once daily  Stop Carafate  H. pylori IgG serology today and ESR  If above unrevealing, may need endoscopic work-up.  Thank you for the courtesy of this consult.  Please call me with any questions or concerns.  Nelida Meuse III  CC: Referring provider noted above

## 2021-06-27 NOTE — Patient Instructions (Signed)
If you are age 59 or older, your body mass index should be between 23-30. Your Body mass index is 32.77 kg/m. If this is out of the aforementioned range listed, please consider follow up with your Primary Care Provider.  If you are age 53 or younger, your body mass index should be between 19-25. Your Body mass index is 32.77 kg/m. If this is out of the aformentioned range listed, please consider follow up with your Primary Care Provider.   ________________________________________________________  The  GI providers would like to encourage you to use [facility] to communicate with providers for non-urgent requests or questions.  Due to long hold times on the telephone, sending your provider a message by [facility] may be a faster and more efficient way to get a response.  Please allow 48 business hours for a response.  Please remember that this is for non-urgent requests.  _______________________________________________________  Your provider has requested that you go to the basement level for lab work before leaving today. Press "B" on the elevator. The lab is located at the first door on the left as you exit the elevator.  You have been scheduled for a CT scan of the abdomen and pelvis at [facility] ([address]---this is in the same building as [place]).   You are scheduled on 06-29-2021 at 3pm. You should arrive 15 minutes prior to your appointment time for registration. Please follow the written instructions below on the day of your exam:  WARNING: IF YOU ARE ALLERGIC TO IODINE/X-RAY DYE, PLEASE NOTIFY RADIOLOGY IMMEDIATELY AT [phone]! YOU WILL BE GIVEN A 13 HOUR PREMEDICATION PREP.  1) Do not eat or drink anything after 11am (4 hours prior to your test) 2) You have been given 2 bottles of oral contrast to drink. The solution may taste better if refrigerated, but do NOT add ice or any other liquid to this solution. Shake well before  drinking.    Drink 1 bottle of contrast @ 1pm (2 hours prior to your exam)  Drink 1 bottle of contrast @ 2pm (1 hour prior to your exam)  You may take any medications as prescribed with a small amount of water, if necessary. If you take any of the following medications: METFORMIN, GLUCOPHAGE, GLUCOVANCE, AVANDAMET, RIOMET, FORTAMET, [place] MET, JANUMET, GLUMETZA or METAGLIP, you MAY be asked to HOLD this medication 48 hours AFTER the exam.  The purpose of you drinking the oral contrast is to aid in the visualization of your intestinal tract. The contrast solution may cause some diarrhea. Depending on your individual set of symptoms, you may also receive an intravenous injection of x-ray contrast/dye. Plan on being at [facility] for 30 minutes or longer, depending on the type of exam you are having performed.  This test typically takes 30-45 minutes to complete.  If you have any questions regarding your exam or if you need to reschedule, you may call the CT department at [phone] between the hours of 8:00 am and 5:00 pm, Monday-Friday.  ________________________________________________________________________   Due to recent changes in healthcare laws, you may see the results of your imaging and laboratory studies on MyChart before your provider has had a chance to review them.  We understand that in some cases there may be results that are confusing or concerning to you. Not all laboratory results come back in the same time frame and the provider may be waiting for multiple results in order to interpret others.  Please give  [place] 48 hours in order for your provider to thoroughly review all the results before contacting the office for clarification of your results.    STOP Carafate.  Start omeprazole 40 mg daily. Start Zofran 4 mg Q 8 hours as needed.  It was a pleasure to see you today!  Thank you for trusting me with your gastrointestinal care!

## 2021-06-29 ENCOUNTER — Ambulatory Visit (INDEPENDENT_AMBULATORY_CARE_PROVIDER_SITE_OTHER)
Admission: RE | Admit: 2021-06-29 | Discharge: 2021-06-29 | Disposition: A | Discharge status: Home / Self Care | Source: Ambulatory Visit | Attending: Gastroenterology | Admitting: Gastroenterology

## 2021-06-29 ENCOUNTER — Other Ambulatory Visit: Payer: Self-pay

## 2021-06-29 ENCOUNTER — Ambulatory Visit

## 2021-06-29 DIAGNOSIS — R110 Nausea: Secondary | ICD-10-CM

## 2021-06-29 DIAGNOSIS — R1084 Generalized abdominal pain: Secondary | ICD-10-CM | POA: Diagnosis not present

## 2021-06-29 DIAGNOSIS — R194 Change in bowel habit: Secondary | ICD-10-CM

## 2021-06-29 MED ORDER — IOHEXOL 300 MG/ML  SOLN
100.0000 mL | Freq: Once | INTRAMUSCULAR | Status: AC | PRN
  Administered 2021-06-29: 100 mL via INTRAVENOUS

## 2021-07-04 ENCOUNTER — Encounter: Payer: Self-pay | Admitting: Gastroenterology

## 2021-07-04 LAB — OVA AND PARASITE EXAMINATION
CONCENTRATE RESULT:: NONE SEEN
MICRO NUMBER:: 13048497
SPECIMEN QUALITY:: ADEQUATE
TRICHROME RESULT:: NONE SEEN

## 2021-07-05 ENCOUNTER — Telehealth: Payer: Self-pay | Admitting: Gastroenterology

## 2021-07-05 ENCOUNTER — Ambulatory Visit (INDEPENDENT_AMBULATORY_CARE_PROVIDER_SITE_OTHER)

## 2021-07-05 DIAGNOSIS — R1084 Generalized abdominal pain: Secondary | ICD-10-CM

## 2021-07-05 DIAGNOSIS — Z23 Encounter for immunization: Secondary | ICD-10-CM | POA: Diagnosis not present

## 2021-07-05 DIAGNOSIS — R194 Change in bowel habit: Secondary | ICD-10-CM

## 2021-07-05 DIAGNOSIS — R1319 Other dysphagia: Secondary | ICD-10-CM

## 2021-07-05 DIAGNOSIS — R110 Nausea: Secondary | ICD-10-CM

## 2021-07-05 MED ORDER — PLENVU 140 G PO SOLR: 1.0000 | ORAL | 0 refills | Status: DC

## 2021-07-05 NOTE — Telephone Encounter (Signed)
Returned call to patient. She has been scheduled for ECL with Dr. Loletha Carrow on Tuesday, 08/14/21 at 2:30 pm. Pt is aware that she will need to arrive at the [facility] by 1:30 pm with a care partner. Pt is aware that I will send her prep to [facility] on file. Pt knows to pick up prep within the next couple of days or the pharmacy will put it back. Pt knows that I will send her instructions via my chart and I will mail a hard copy to her home. Pt verbalized understanding and had no concerns at the end of the call.  ? ?Ambulatory referral to GI in epic.  ? ?PLENVU prep sent to requested pharmacy. Instructions sent to patient via my chart and mailed.  ?

## 2021-07-05 NOTE — Telephone Encounter (Signed)
This patient needs to schedule egd/colon with Dr. Loletha Carrow.  I told her you would call her back later today. ?

## 2021-08-13 ENCOUNTER — Encounter: Payer: Self-pay | Admitting: Certified Registered Nurse Anesthetist

## 2021-08-14 ENCOUNTER — Encounter: Admitting: Gastroenterology

## 2021-08-14 ENCOUNTER — Telehealth: Payer: Self-pay | Admitting: Gastroenterology

## 2021-08-14 MED ORDER — PLENVU 140 G PO SOLR: 1.0000 | ORAL | 0 refills | Status: DC

## 2021-08-14 NOTE — Telephone Encounter (Signed)
Good Morning Dr. Loletha Carrow, ? ? ?Patient called and stated that she needed to cancel procedure with you today at 2:30 due to her pharmacy not letting her know that her prep had been sent and was ready. ? ? ?Patient was rescheduled for 5/22 at 11:00.  ?

## 2021-08-14 NOTE — Telephone Encounter (Signed)
Called and spoke with pt. I informed her that I am sending the prep now and she will need to pick I up from her pharmacy in the next couple of days or the pharmacy will put it back. Pt is aware that I will send her updated instructions via my chart and I will mail her a copy. Pt verbalized understanding and had no concerns at the end of the call. ?

## 2021-08-14 NOTE — Telephone Encounter (Signed)
Inbound call from patient stating that she needs her prep medication resent because [facility] did not inform her that it had been sent and it was ready to pick up for her procedure today. Patient rescheduled procedure for 5/22 at 11. ?

## 2021-08-14 NOTE — Telephone Encounter (Signed)
I understand and will allow this one chance to reschedule. ? ?Please let her know that per written preprocedure instructions, it is her responsibility to contact her pharmacy at least 3 days prior to procedure to get her bowel preparation. ? ?- HD ?

## 2021-08-23 ENCOUNTER — Telehealth: Payer: Self-pay | Admitting: Adult Health

## 2021-08-23 ENCOUNTER — Telehealth (INDEPENDENT_AMBULATORY_CARE_PROVIDER_SITE_OTHER): Admitting: Adult Health

## 2021-08-23 ENCOUNTER — Encounter: Payer: Self-pay | Admitting: Adult Health

## 2021-08-23 VITALS — Ht 63.0 in | Wt 185.0 lb

## 2021-08-23 DIAGNOSIS — R5383 Other fatigue: Secondary | ICD-10-CM | POA: Diagnosis not present
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
## 2021-08-27 ENCOUNTER — Encounter: Payer: Self-pay | Admitting: Internal Medicine

## 2021-08-27 ENCOUNTER — Ambulatory Visit (INDEPENDENT_AMBULATORY_CARE_PROVIDER_SITE_OTHER): Admitting: Internal Medicine

## 2021-08-27 VITALS — BP 148/92 | HR 80 | Temp 97.7°F | Wt 193.1 lb

## 2021-08-27 DIAGNOSIS — J3489 Other specified disorders of nose and nasal sinuses: Secondary | ICD-10-CM | POA: Diagnosis not present

## 2021-08-27 MED ORDER — PREDNISONE 10 MG (21) PO TBPK: ORAL_TABLET | ORAL | 0 refills | Status: DC

## 2021-08-27 NOTE — Progress Notes (Signed)
? ? ? ?Acute office Visit ? ? ? ? ?This visit occurred during the SARS-CoV-2 public health emergency.  Safety protocols were in place, including screening questions prior to the visit, additional usage of staff PPE, and extensive cleaning of exam room while observing appropriate contact time as indicated for disinfecting solutions.  ? ? ?CC/Reason for Visit: Sinus pain and pressure ? ?HPI: April Gonzalez is a 61 y.o. female who is coming in today for the above mentioned reasons.  She has a lot of sinus issues especially during the springtime.  She has been using Sudafed without relief.  Problem has been ongoing for 2 weeks.  She has had no URI symptoms like fever, cough runny nose or sore throat.  No purulent drainage, no sick contacts or recent travel.  She is also having bilateral ear pain and pressure. ? ?Past Medical/Surgical History: ?Past Medical History:  ?Diagnosis Date  ? Allergy   ? Anemia 05/2012  ? transfusion  ? Fibroids   ? H/O myomectomy  ? GERD (gastroesophageal reflux disease)   ? rare   ? H/O uterine prolapse   ? History of blood transfusion   ? x 3   ? Hyperlipidemia   ? Hypertension   ? Rectal bleeding   ? NOTED AT pv 01-09-17- PT STATES with most BM's for the last 2 years, also hurts   ? Seizures ([place])   ? last seizure age 82 or 10 per pt report-01-09-17 no change in this  ? Thyroid disorder   ? ? ?Past Surgical History:  ?Procedure Laterality Date  ? ANKLE SURGERY    ? One plate, seven screws in the right ankle   ? BLADDER SUSPENSION N/A 12/10/2012  ? Procedure: TRANSVAGINAL TAPE (TVT) PROCEDURE;  Surgeon: Delice Lesch, MD;  Location: [facility];  Service: Gynecology;  Laterality: N/A;  ? CYSTOCELE REPAIR N/A 12/10/2012  ? Procedure: ANTERIOR REPAIR (CYSTOCELE);  Surgeon: Eldred Manges, MD;  Location: [facility];  Service: Gynecology;  Laterality: N/A;  ? CYSTOSCOPY N/A 12/10/2012  ? Procedure: CYSTOSCOPY;  Surgeon: Delice Lesch, MD;  Location: [facility];  Service: Gynecology;  Laterality: N/A;  ? EXCISION  VAGINAL CYST    ? MYOMECTOMY  43YRS AGO  ? HYSTEROSCOPIC  ? TUBAL LIGATION  10/10/1991  ? VAGINAL HYSTERECTOMY N/A 12/10/2012  ? Procedure: Total Vaginal Hysterectomy;  Surgeon: Eldred Manges, MD;  Location: [facility];  Service: Gynecology;  Laterality: N/A;  ? ? ?Social History: ? reports that she has never smoked. She has never used smokeless tobacco. She reports current alcohol use. She reports that she does not use drugs. ? ?Allergies: ?Allergies  ?Allergen Reactions  ? Methimazole Anaphylaxis  ?  EMS had to be called and a 7-day's stay in the hospital resulted  ? Nitroglycerin Other (See Comments)  ?  Sweating ?  ? Other Itching and Other (See Comments)  ?  Seasonal allergies & dog hair = runny nose also  ? Tetracycline Hcl Hypertension  ? Promethazine Nausea Only  ?  Made the patient feel like she was going to vomit and it "spaced" her out  ? Shrimp [Shellfish Allergy] Itching  ? Latex Rash  ? Penicillins Other (See Comments)  ?  Has patient had a PCN reaction causing immediate rash, facial/tongue/throat swelling, SOB or lightheadedness with hypotension: Unk ?Has patient had a PCN reaction causing severe rash involving mucus membranes or skin necrosis: Unk ?Has patient had a PCN reaction that required hospitalization: Unk ?Has patient  had a PCN reaction occurring within the last 10 years: Yes ?If all of the above answers are "NO", then may proceed with Cephalosporin use. ?  ? Tape Rash  ? Terbutaline Other (See Comments)  ?  Reaction not recalled (??)  ? ? ?Family History:  ?Family History  ?Problem Relation Age of Onset  ? Heart failure Father   ? Diabetes Mother   ? High blood pressure Mother   ? Arthritis Mother   ? Breast cancer Maternal Aunt   ?     in her 57's   ? Breast cancer Cousin   ? Thyroid disease Sister   ? Colon cancer Neg Hx   ? Colon polyps Neg Hx   ? Esophageal cancer Neg Hx   ? Rectal cancer Neg Hx   ? Stomach cancer Neg Hx   ? ? ? ?Current Outpatient Medications:  ?  acetaminophen (TYLENOL)  500 MG tablet, Take 500-1,000 mg by mouth every 6 (six) hours as needed for mild pain or headache., Disp: , Rfl:  ?  doxycycline (VIBRAMYCIN) 100 MG capsule, Take 1 capsule (100 mg total) by mouth 2 (two) times daily., Disp: 14 capsule, Rfl: 0 ?  lisinopril (ZESTRIL) 20 MG tablet, TAKE ONE TABLET BY MOUTH DAILY, Disp: 60 tablet, Rfl: 0 ?  omeprazole (PRILOSEC) 40 MG capsule, Take 1 capsule (40 mg total) by mouth daily., Disp: 30 capsule, Rfl: 1 ?  ondansetron (ZOFRAN) 4 MG tablet, Take 1 tablet (4 mg total) by mouth every 8 (eight) hours as needed for nausea or vomiting., Disp: 30 tablet, Rfl: 1 ?  PEG-KCl-NaCl-NaSulf-Na Asc-C (PLENVU) 140 g SOLR, Take 1 kit by mouth as directed. Manufacturer's coupon Universal coupon code:BIN: P2366821; GROUP: [id number]; PCN: [id number]; ID: [id number]; PAY NO MORE $50; NO prior authorization, Disp: 1 each, Rfl: 0 ?  predniSONE (STERAPRED UNI-PAK 21 TAB) 10 MG (21) TBPK tablet, Take as directed, Disp: 21 tablet, Rfl: 0 ?  triamcinolone cream (KENALOG) 0.1 %, Apply 1 application topically 2 (two) times daily., Disp: 30 g, Rfl: 0 ?  EPINEPHrine (EPIPEN 2-PAK) 0.3 mg/0.3 mL IJ SOAJ injection, Inject 0.3 mLs (0.3 mg total) into the muscle once for 1 dose. If you develop difficulty breathing, Disp: 0.3 mL, Rfl: 0 ? ?Review of Systems:  ?Constitutional: Denies fever, chills, diaphoresis, appetite change and fatigue.  ?HEENT: Denies photophobia, eye pain, redness,  sore throat, rhinorrhea, sneezing, mouth sores, trouble swallowing, neck pain, neck stiffness and tinnitus.   ?Respiratory: Denies SOB, DOE, cough, chest tightness,  and wheezing.   ?Cardiovascular: Denies chest pain, palpitations and leg swelling.  ?Gastrointestinal: Denies nausea, vomiting, abdominal pain, diarrhea, constipation, blood in stool and abdominal distention.  ?Genitourinary: Denies dysuria, urgency, frequency, hematuria, flank pain and difficulty urinating.  ?Endocrine: Denies: hot or cold intolerance, sweats, changes  in hair or nails, polyuria, polydipsia. ?Musculoskeletal: Denies myalgias, back pain, joint swelling, arthralgias and gait problem.  ?Skin: Denies pallor, rash and wound.  ?Neurological: Denies dizziness, seizures, syncope, weakness, light-headedness, numbness and headaches.  ?Hematological: Denies adenopathy. Easy bruising, personal or family bleeding history  ?Psychiatric/Behavioral: Denies suicidal ideation, mood changes, confusion, nervousness, sleep disturbance and agitation ? ? ? ?Physical Exam: ?Vitals:  ? 08/27/21 1555  ?BP: (!) 148/92  ?Pulse: 80  ?Temp: 97.7 ?F (36.5 ?C)  ?TempSrc: Oral  ?SpO2: 94%  ?Weight: 193 lb 1.6 oz (87.6 kg)  ? ? ?Body mass index is 34.21 kg/m?. ? ? ?Constitutional: NAD, calm, comfortable ?Eyes: PERRL, lids and conjunctivae normal ?ENMT: Mucous  membranes are moist. Posterior pharynx clear of any exudate or lesions. Normal dentition. Tympanic membrane is pearly white, no erythema or bulging. ?Respiratory: clear to auscultation bilaterally, no wheezing, no crackles. Normal respiratory effort. No accessory muscle use.  ?Cardiovascular: Regular rate and rhythm, no murmurs / rubs / gallops. No extremity edema.  ?Psychiatric: Normal judgment and insight. Alert and oriented x 3. Normal mood.  ? ? ?Impression and Plan: ? ?Sinus pressure  ?- Plan: predniSONE (STERAPRED UNI-PAK 21 TAB) 10 MG (21) TBPK tablet ?-Suspect related to allergies, have advised daily antihistamine, intranasal steroid, given severity and duration I will prescribe a prednisone taper as well. ? ? ? ?Time spent:21 minutes reviewing chart, interviewing and examining patient and formulating plan of care. ? ? ? ? ? ?Lelon Frohlich, MD ?[facility] ? ? ? ?

## 2021-09-03 ENCOUNTER — Ambulatory Visit (INDEPENDENT_AMBULATORY_CARE_PROVIDER_SITE_OTHER): Admitting: Family Medicine

## 2021-09-03 ENCOUNTER — Encounter: Payer: Self-pay | Admitting: Family Medicine

## 2021-09-03 VITALS — BP 134/82 | HR 72 | Temp 97.9°F | Ht 63.0 in | Wt 191.9 lb

## 2021-09-03 DIAGNOSIS — H9190 Unspecified hearing loss, unspecified ear: Secondary | ICD-10-CM

## 2021-09-03 DIAGNOSIS — R0981 Nasal congestion: Secondary | ICD-10-CM

## 2021-09-03 NOTE — Patient Instructions (Signed)
Add Flonase nasal spray ? ?Finish out the prednisone  ? ?Consider nasal saline irrigation with Netti pot.  ?

## 2021-09-03 NOTE — Progress Notes (Signed)
? ?Established Patient Office Visit ? ?Subjective   ?Patient ID: April Gonzalez, female    DOB: February 24, 1961  Age: 61 y.o. MRN: [id number] ? ?Chief Complaint  ?Patient presents with  ? Sinusitis  ?  Patient complains of sinusitis, x3 weeks, Tried Prednisone with little relief   ? ? ?HPI ? ? ?Patient seen with 3-week history of sinus congestive symptoms.  She had some pressure over her maxillary sinuses.  Has had some bilateral ear pain as well.  No ear drainage.  No headaches.  No significant facial pain.  No purulent secretions.  No bloody nasal discharge.  Denies any cough. ? ?She states her main concern is that she is leaving for a cruise in a few days.  She was prescribed prednisone taper but apparently got off and her dosing and skip a couple days.  She is taking some type of antihistamine but no nasal steroid.  She is not sure but thinks this may be allergic. ? ?She states she had some chronic bilateral hearing loss which she estimates is about "50% "down from baseline over the past few years.  Has not had audiology assessment.  No significant tinnitus.  No vertigo. ? ?Past Medical History:  ?Diagnosis Date  ? Allergy   ? Anemia 05/2012  ? transfusion  ? Fibroids   ? H/O myomectomy  ? GERD (gastroesophageal reflux disease)   ? rare   ? H/O uterine prolapse   ? History of blood transfusion   ? x 3   ? Hyperlipidemia   ? Hypertension   ? Rectal bleeding   ? NOTED AT pv 01-09-17- PT STATES with most BM's for the last 2 years, also hurts   ? Seizures ([place])   ? last seizure age 13 or 10 per pt report-01-09-17 no change in this  ? Thyroid disorder   ? ?Past Surgical History:  ?Procedure Laterality Date  ? ANKLE SURGERY    ? One plate, seven screws in the right ankle   ? BLADDER SUSPENSION N/A 12/10/2012  ? Procedure: TRANSVAGINAL TAPE (TVT) PROCEDURE;  Surgeon: Delice Lesch, MD;  Location: [facility];  Service: Gynecology;  Laterality: N/A;  ? CYSTOCELE REPAIR N/A 12/10/2012  ? Procedure: ANTERIOR REPAIR (CYSTOCELE);  Surgeon:  Eldred Manges, MD;  Location: [facility];  Service: Gynecology;  Laterality: N/A;  ? CYSTOSCOPY N/A 12/10/2012  ? Procedure: CYSTOSCOPY;  Surgeon: Delice Lesch, MD;  Location: [facility];  Service: Gynecology;  Laterality: N/A;  ? EXCISION VAGINAL CYST    ? MYOMECTOMY  62YRS AGO  ? HYSTEROSCOPIC  ? TUBAL LIGATION  10/10/1991  ? VAGINAL HYSTERECTOMY N/A 12/10/2012  ? Procedure: Total Vaginal Hysterectomy;  Surgeon: Eldred Manges, MD;  Location: [facility];  Service: Gynecology;  Laterality: N/A;  ? ? reports that she has never smoked. She has never used smokeless tobacco. She reports current alcohol use. She reports that she does not use drugs. ?family history includes Arthritis in her mother; Breast cancer in her cousin and maternal aunt; Diabetes in her mother; Heart failure in her father; High blood pressure in her mother; Thyroid disease in her sister. ?Allergies  ?Allergen Reactions  ? Methimazole Anaphylaxis  ?  EMS had to be called and a 7-day's stay in the hospital resulted  ? Nitroglycerin Other (See Comments)  ?  Sweating ?  ? Other Itching and Other (See Comments)  ?  Seasonal allergies & dog hair = runny nose also  ? Tetracycline Hcl Hypertension  ?  Promethazine Nausea Only  ?  Made the patient feel like she was going to vomit and it "spaced" her out  ? Shrimp [Shellfish Allergy] Itching  ? Latex Rash  ? Penicillins Other (See Comments)  ?  Has patient had a PCN reaction causing immediate rash, facial/tongue/throat swelling, SOB or lightheadedness with hypotension: Unk ?Has patient had a PCN reaction causing severe rash involving mucus membranes or skin necrosis: Unk ?Has patient had a PCN reaction that required hospitalization: Unk ?Has patient had a PCN reaction occurring within the last 10 years: Yes ?If all of the above answers are "NO", then may proceed with Cephalosporin use. ?  ? Tape Rash  ? Terbutaline Other (See Comments)  ?  Reaction not recalled (??)  ? ? ?Review of Systems  ?Constitutional:   Negative for chills and fever.  ?HENT:  Positive for congestion. Negative for sore throat.   ?Respiratory:  Negative for cough and shortness of breath.   ?Cardiovascular:  Negative for chest pain.  ? ?  ?Objective:  ?  ? ?BP 134/82 (BP Location: Left Arm, Patient Position: Sitting, Cuff Size: Normal)   Pulse 72   Temp 97.9 ?F (36.6 ?C) (Oral)   Ht '5\' 3"'$  (1.6 m)   Wt 191 lb 14.4 oz (87 kg)   LMP 12/03/2012   SpO2 96%   BMI 33.99 kg/m?  ? ? ?Physical Exam ?Vitals reviewed.  ?Constitutional:   ?   Appearance: Normal appearance.  ?HENT:  ?   Right Ear: Tympanic membrane normal.  ?   Left Ear: Tympanic membrane normal.  ?Cardiovascular:  ?   Rate and Rhythm: Normal rate and regular rhythm.  ?Pulmonary:  ?   Effort: Pulmonary effort is normal.  ?   Breath sounds: Normal breath sounds. No wheezing or rales.  ?Neurological:  ?   Mental Status: She is alert.  ? ? ? ?No results found for any visits on 09/03/21. ? ? ? ?The ASCVD Risk score (Arnett DK, et al., 2019) failed to calculate for the following reasons: ?  Cannot find a previous HDL lab ?  Cannot find a previous total cholesterol lab ? ?  ?Assessment & Plan:  ? ?#1 persistent nasal congestion.  Does not have predictors of bacterial sinusitis.  Nonfocal exam.  We suggested adding Flonase to her over-the-counter antihistamine.  Also consider nasal saline irrigation with Nettie pot.  Reviewed signs and symptoms of bacterial sinusitis to watch out for ? ?#2 subjective bilateral hearing loss.  Set up audiology referral for further assessment ? ? ?No follow-ups on file.  ? ? ?Carolann Littler, MD ? ?

## 2021-09-04 ENCOUNTER — Ambulatory Visit (INDEPENDENT_AMBULATORY_CARE_PROVIDER_SITE_OTHER): Admitting: Family Medicine

## 2021-09-04 ENCOUNTER — Encounter: Payer: Self-pay | Admitting: Family Medicine

## 2021-09-04 VITALS — BP 140/90 | HR 86 | Temp 97.8°F | Wt 192.0 lb

## 2021-09-04 DIAGNOSIS — I10 Essential (primary) hypertension: Secondary | ICD-10-CM

## 2021-09-04 DIAGNOSIS — H6983 Other specified disorders of Eustachian tube, bilateral: Secondary | ICD-10-CM

## 2021-09-04 NOTE — Progress Notes (Signed)
? ?  Subjective:  ? ? Patient ID: April Gonzalez, female    DOB: January 13, 1961, 61 y.o.   MRN: [id number] ? ?HPI ?Here for concerns over high BP and to follow up on bilateral ear pain. She was seen her on 08-27-21 for the ear pain and she was prescribed a prednisone taper. She took this several days, but then stopped for some reason. Her BP at that visit was 148/92 which seems to be her baseline. Then she was seen here again yesterday and she was told to restart the prednisone and to finish the taper. She did so, and it sounds like she may have tried to catch by taking several days' worth of the pills. This morning she felt weak and found her BP to be up to 179/110. She is here to check this. She is concerned because she will be leaving for an [place] cruise in 4 days.  ? ? ?Review of Systems  ?Constitutional: Negative.   ?HENT:  Positive for congestion and ear pain.   ?Respiratory: Negative.    ?Cardiovascular: Negative.   ? ?   ?Objective:  ? Physical Exam ?Constitutional:   ?   General: She is not in acute distress. ?   Appearance: Normal appearance.  ?HENT:  ?   Right Ear: Tympanic membrane, ear canal and external ear normal.  ?   Left Ear: Tympanic membrane, ear canal and external ear normal.  ?   Nose: Nose normal.  ?   Mouth/Throat:  ?   Pharynx: Oropharynx is clear.  ?Eyes:  ?   Conjunctiva/sclera: Conjunctivae normal.  ?Pulmonary:  ?   Effort: Pulmonary effort is normal.  ?   Breath sounds: Normal breath sounds.  ?Neurological:  ?   Mental Status: She is alert.  ? ? ? ? ? ?   ?Assessment & Plan:  ?First I explained that her BP spike this morning was likely a side effect of the prednisone. We agreed that she would stop this and not take any more. Her BP should settle down over th next few days. Her ear pain is from eustachian tube dysfunction. She has Flonase at home and I advised her to get back on this daily. She can also try Mucinex 1200 mg BID. Recheck as needed. ?Alysia Penna, MD ? ? ?

## 2021-09-05 ENCOUNTER — Ambulatory Visit (INDEPENDENT_AMBULATORY_CARE_PROVIDER_SITE_OTHER): Admitting: Adult Health

## 2021-09-05 ENCOUNTER — Encounter: Payer: Self-pay | Admitting: Adult Health

## 2021-09-05 VITALS — BP 154/94 | HR 84 | Temp 97.6°F | Ht 62.0 in | Wt 193.2 lb

## 2021-09-05 DIAGNOSIS — I10 Essential (primary) hypertension: Secondary | ICD-10-CM | POA: Diagnosis not present

## 2021-09-05 DIAGNOSIS — Z6835 Body mass index (BMI) 35.0-35.9, adult: Secondary | ICD-10-CM

## 2021-09-05 DIAGNOSIS — G4700 Insomnia, unspecified: Secondary | ICD-10-CM | POA: Insufficient documentation

## 2021-09-05 DIAGNOSIS — R0683 Snoring: Secondary | ICD-10-CM

## 2021-09-05 NOTE — Assessment & Plan Note (Signed)
Healthy sleep regimen discussed.  We discussed decreasing her caffeine intake.  Also decreasing screen time.  Helpful hands on healthy sleep regimen were discussed. ?

## 2021-09-05 NOTE — Assessment & Plan Note (Signed)
Discussed healthy weight loss and activity as tolerated ?

## 2021-09-05 NOTE — Progress Notes (Signed)
? ?@Patient  ID: April Gonzalez, female    DOB: [birth year], 61 y.o.   MRN: [id number] ? ?Chief Complaint  ?Patient presents with  ? Consult  ? ? ?Referring provider: ?[name], NP ? ?HPI: ?61 year old female seen for sleep consult Sep 05, 2021 for snoring, daytime sleepiness and restless sleep ? ?TEST/EVENTS :  ? ?09/05/2021 Sleep consult  ?Patient presents for a sleep consult today.  Kindly referred by her primary care provider [name], NP .  Patient complains that she has very restless sleep.  Has trouble sleeping for very long time.  She says she typically will lay down it takes her up to an hour to go to sleep.  Has to use her phone to play games to make herself sleepy and finally will doze off but wakes up about every hour and is very restless.  Her spouse reports snoring and very restless sleep.  She has significant daytime sleepiness.  She says she gets very sleepy if she is an active.  Epworth score was 15 out of 24.  She gets sleepy watching TV, sitting still or riding in a car.  She typically goes to bed about 9 to 11 PM.  Takes up to an hour to go to sleep.  Is up several times throughout the night.  And gets up about 7 AM.  She does not operate heavy machinery for work.  Weight is up about 20 pounds over the last 2 years.  Current weight is at 193 pounds with a BMI at 35.  She does not use any sleep aids.  Does drink caffeinated tea throughout the day.  Usually about 2 to 3 cups.  And does drink at later in the evening and at dinnertime.  She does watch TV and use her phone a lot.  She has no symptoms suspicious for cataplexy or sleep paralysis. ?Her husband has sleep apnea and is on a CPAP machine. ? ?Medical history is significant for hypertension, chronic allergies, chronic headaches.  She is prone to tachycardia and racing heart. ? ?Surgical history right ankle surgery, hysterectomy ? ?Social history patient is married.  Currently not working.  Has adult children.  She lives at home with her  husband.  She is a never smoker.  Rare alcohol use.  No drug use. ? ?Family history positive for allergies, asthma, heart disease, rheumatoid arthritis, cancer ? ? ? ?Allergies  ?Allergen Reactions  ? Methimazole Anaphylaxis  ?  EMS had to be called and a 7-day's stay in the hospital resulted  ? Nitroglycerin Other (See Comments)  ?  Sweating ?  ? Other Itching and Other (See Comments)  ?  Seasonal allergies & dog hair = runny nose also  ? Tetracycline Hcl Hypertension  ? Promethazine Nausea Only  ?  Made the patient feel like she was going to vomit and it "spaced" her out  ? Shrimp [Shellfish Allergy] Itching  ? Latex Rash  ? Penicillins Other (See Comments)  ?  Has patient had a PCN reaction causing immediate rash, facial/tongue/throat swelling, SOB or lightheadedness with hypotension: Unk ?Has patient had a PCN reaction causing severe rash involving mucus membranes or skin necrosis: Unk ?Has patient had a PCN reaction that required hospitalization: Unk ?Has patient had a PCN reaction occurring within the last 10 years: Yes ?If all of the above answers are "NO", then may proceed with Cephalosporin use. ?  ? Tape Rash  ? Terbutaline Other (See Comments)  ?  Reaction not recalled (??)  ? ? ?  Immunization History  ?Administered Date(s) Administered  ? Influenza,inj,Quad PF,6+ Mos 12/30/2013, 03/07/2017, 02/13/2019, 07/14/2020  ? MMR 06/03/2013, 07/02/2013  ? PFIZER(Purple Top)SARS-COV-2 Vaccination 07/22/2019, 08/16/2019, 02/19/2020  ? Td 05/06/2006  ? Tdap 04/13/2003, 03/07/2005  ? Zoster Recombinat (Shingrix) 01/26/2021, 07/05/2021  ? ? ?Past Medical History:  ?Diagnosis Date  ? Allergy   ? Anemia 05/2012  ? transfusion  ? Fibroids   ? H/O myomectomy  ? GERD (gastroesophageal reflux disease)   ? rare   ? H/O uterine prolapse   ? History of blood transfusion   ? x 3   ? Hyperlipidemia   ? Hypertension   ? Rectal bleeding   ? NOTED AT pv 01-09-17- PT STATES with most BM's for the last 2 years, also hurts   ? Seizures  ([place])   ? last seizure age 34 or 10 per pt report-01-09-17 no change in this  ? Thyroid disorder   ? ? ?Tobacco History: ?Social History  ? ?Tobacco Use  ?Smoking Status Never  ?Smokeless Tobacco Never  ? ?Counseling given: Not Answered ? ? ?Outpatient Medications Prior to Visit  ?Medication Sig Dispense Refill  ? acetaminophen (TYLENOL) 500 MG tablet Take 500-1,000 mg by mouth every 6 (six) hours as needed for mild pain or headache.    ? triamcinolone cream (KENALOG) 0.1 % Apply 1 application topically 2 (two) times daily. 30 g 0  ? doxycycline (VIBRAMYCIN) 100 MG capsule Take 1 capsule (100 mg total) by mouth 2 (two) times daily. (Patient not taking: Reported on 09/05/2021) 14 capsule 0  ? EPINEPHrine (EPIPEN 2-PAK) 0.3 mg/0.3 mL IJ SOAJ injection Inject 0.3 mLs (0.3 mg total) into the muscle once for 1 dose. If you develop difficulty breathing 0.3 mL 0  ? lisinopril (ZESTRIL) 20 MG tablet TAKE ONE TABLET BY MOUTH DAILY (Patient not taking: Reported on 09/05/2021) 60 tablet 0  ? omeprazole (PRILOSEC) 40 MG capsule Take 1 capsule (40 mg total) by mouth daily. (Patient not taking: Reported on 09/05/2021) 30 capsule 1  ? ondansetron (ZOFRAN) 4 MG tablet Take 1 tablet (4 mg total) by mouth every 8 (eight) hours as needed for nausea or vomiting. (Patient not taking: Reported on 09/05/2021) 30 tablet 1  ? PEG-KCl-NaCl-NaSulf-Na Asc-C (PLENVU) 140 g SOLR Take 1 kit by mouth as directed. Manufacturer's coupon Universal coupon code:BIN: P2366821; GROUP: [id number]; PCN: [id number]; ID: [id number]; PAY NO MORE $50; NO prior authorization (Patient not taking: Reported on 09/05/2021) 1 each 0  ? predniSONE (STERAPRED UNI-PAK 21 TAB) 10 MG (21) TBPK tablet Take as directed (Patient not taking: Reported on 09/05/2021) 21 tablet 0  ? ?No facility-administered medications prior to visit.  ? ? ? ?Review of Systems:  ? ?Constitutional:   No  weight loss, night sweats,  Fevers, chills,  ?+fatigue, or  lassitude. ? ?HEENT:   No headaches,  Difficulty  swallowing,  Tooth/dental problems, or  Sore throat,  ?              No sneezing, itching, ear ache,  ?+nasal congestion, post nasal drip,  ? ?CV:  No chest pain,  Orthopnea, PND, swelling in lower extremities, anasarca, dizziness, palpitations, syncope.  ? ?GI  No heartburn, indigestion, abdominal pain, nausea, vomiting, diarrhea, change in bowel habits, loss of appetite, bloody stools.  ? ?Resp: No shortness of breath with exertion or at rest.  No excess mucus, no productive cough,  No non-productive cough,  No coughing up of blood.  No change in color of mucus.  No wheezing.  No chest wall deformity ? ?Skin: no rash or lesions. ? ?GU: no dysuria, change in color of urine, no urgency or frequency.  No flank pain, no hematuria  ? ?MS:  No joint pain or swelling.  No decreased range of motion.  No back pain. ? ? ? ?Physical Exam ? ?BP (!) 154/94 (BP Location: Left Arm, Cuff Size: Normal)   Pulse 84   Temp 97.6 ?F (36.4 ?C) (Temporal)   Ht 5' 2"  (1.575 m)   Wt 193 lb 3.2 oz (87.6 kg)   LMP 12/03/2012   SpO2 96%   BMI 35.34 kg/m?  ? ?GEN: A/Ox3; pleasant , NAD, well nourished  ?  ?HEENT:  [place]/AT,   NOSE-clear, THROAT-clear, no lesions, no postnasal drip or exudate noted.  ?Class III MP airway ? ?NECK:  Supple w/ fair ROM; no JVD; normal carotid impulses w/o bruits; no thyromegaly or nodules palpated; no lymphadenopathy.   ? ?RESP  Clear  P & A; w/o, wheezes/ rales/ or rhonchi. no accessory muscle use, no dullness to percussion ? ?CARD:  RRR, no m/r/g, no peripheral edema, pulses intact, no cyanosis or clubbing. ? ?GI:   Soft & nt; nml bowel sounds; no organomegaly or masses detected.  ? ?Musco: Warm bil, no deformities or joint swelling noted.  ? ?Neuro: alert, no focal deficits noted.   ? ?Skin: Warm, no lesions or rashes ? ? ? ?Lab Results: ? ? ? ?BNP ?No results found for: BNP ? ?ProBNP ?No results found for: PROBNP ? ?Imaging: ?No results found. ? ? ? ? ?No results found for:  NITRICOXIDE ? ? ? ? ? ?Assessment & Plan:  ? ?Snoring ?Snoring, restless sleep, BMI 35 and daytime sleepiness are all suspicious for possible underlying sleep apnea.  Set patient up for home sleep study.  Healthy sleep regimen discussed.  We did disc

## 2021-09-05 NOTE — Assessment & Plan Note (Signed)
Patient has underlying hypertension.  Blood pressure is elevated today.  Patient is advised to follow-up primary care.  Home sleep study is pending.  Healthy sleep regimen discussed. ?

## 2021-09-05 NOTE — Assessment & Plan Note (Addendum)
Snoring, restless sleep, BMI 35 and daytime sleepiness are all suspicious for possible underlying sleep apnea.  Set patient up for home sleep study.  Healthy sleep regimen discussed.  We did discuss decreasing her caffeine intake.  Also decreasing screen time. ? ?.- discussed how weight can impact sleep and risk for sleep disordered breathing ?- discussed options to assist with weight loss: combination of diet modification, cardiovascular and strength training exercises ?  ?- had an extensive discussion regarding the adverse health consequences related to untreated sleep disordered breathing ?- specifically discussed the risks for hypertension, coronary artery disease, cardiac dysrhythmias, cerebrovascular disease, and diabetes ?- lifestyle modification discussed ?  ?- discussed how sleep disruption can increase risk of accidents, particularly when driving ?- safe driving practices were discussed ?  ? ? ?Plan  ?Patient Instructions  ?Set up for home sleep study  ?Activity as tolerated .  ?Healthy sleep regimen  ?Decrease screen time  ?Decrease caffeine , none after 2 pm .  ?Work on healthy weight  ?Follow up with PCP for hypertension  ?Follow up in 6 weeks to discuss test results and treatment plan .  ? ?  ? ?

## 2021-09-05 NOTE — Patient Instructions (Signed)
Set up for home sleep study  ?Activity as tolerated .  ?Healthy sleep regimen  ?Decrease screen time  ?Decrease caffeine , none after 2 pm .  ?Work on healthy weight  ?Follow up with PCP for hypertension  ?Follow up in 6 weeks to discuss test results and treatment plan .  ? ?

## 2021-09-24 ENCOUNTER — Encounter: Admitting: Gastroenterology

## 2021-09-26 ENCOUNTER — Ambulatory Visit: Admitting: Audiologist

## 2021-09-27 ENCOUNTER — Ambulatory Visit: Attending: Audiologist | Admitting: Audiologist

## 2021-09-27 DIAGNOSIS — H9193 Unspecified hearing loss, bilateral: Secondary | ICD-10-CM | POA: Diagnosis present

## 2021-09-27 DIAGNOSIS — H9202 Otalgia, left ear: Secondary | ICD-10-CM | POA: Diagnosis present

## 2021-09-27 NOTE — Procedures (Signed)
  Outpatient Audiology and [place],   [id number] [phone]  AUDIOLOGICAL  EVALUATION  NAME: Sonal Dorwart     DOB:   11-21-1960      MRN: [id number]                                                                                     DATE: 09/27/2021     REFERENT: [name], NP STATUS: Outpatient DIAGNOSIS: Sensorineural Hearing Loss Bilateral, Mild    History: Virgia was seen for an audiological evaluation.  Micky is receiving a hearing evaluation due to concerns for left ear hearing loss and pain. Bayla cannot hear people on a daily basis. On the phone people sound muffled and unclear. This difficulty began gradually over the last few years and is now impactful on a daily basis. Jaidan knows she has a small ear canal in the left ear. The pain she feels is a dull constant aching.  Medical history positive for  acute issue of sinusitis in 2021 which could be a cause for her pain. No other relevant case history reported.    Evaluation:  Otoscopy showed a clear view of the tympanic membranes, bilaterally Left ear canal is narrow but does not look swollen or irritated Tympanometry results were consistent with normal middle ear function, bilaterally   Audiometric testing was completed using conventional audiometry with pediatric size insert transducer. Speech Recognition Thresholds were  consistent with pure tone averages, 15dB in each ear. Word Recognition was perfomed at 55dB with masking, scores 100% in each ear.  Pure tone thresholds show normal hearing  in both ears with a mild loss at [facility] only in each ear. Test results are consistent with essentially normal symmetric hearing.  Results:  The test results were reviewed with Tylea. Her hearing is essentially normal. Her middle ears are functioning fine. There is no indications of ear pathology on today's exam. Further exploration of the pain in her left ear would require a medical  examination. She consented to a request for a referral to Otolaryngology.    Recommendations: 1.   Pain in left ear has lasted many years, Sherrill requested referral for medical assessment of this ear.  Referral to ENT Physician requested due to Emrie's report of constant severe left ear pain for last several years.    27 minutes spent testing and counseling on results.   Alfonse Alpers  Audiologist, Au.D., CCC-A 09/27/2021  11:47 AM  Cc: [name], NP

## 2021-09-28 ENCOUNTER — Telehealth: Payer: Self-pay | Admitting: Adult Health

## 2021-09-28 NOTE — Telephone Encounter (Signed)
Lm for pt to return call. Pt should refer to her surgeon for answers.

## 2021-09-28 NOTE — Telephone Encounter (Signed)
Pt called trying to get info on past surgeries and wanted to see if her Bladder Suspension Surgery is the same as a Mesh Surgery.  Please advise.

## 2021-10-04 NOTE — Telephone Encounter (Signed)
lm

## 2021-10-16 ENCOUNTER — Telehealth: Payer: Self-pay | Admitting: *Deleted

## 2021-10-16 NOTE — Telephone Encounter (Signed)
ATC x1.  No answer.  VM full.

## 2021-10-16 NOTE — Telephone Encounter (Signed)
ATC x3.  No answer.  VM full.

## 2021-10-17 ENCOUNTER — Ambulatory Visit (INDEPENDENT_AMBULATORY_CARE_PROVIDER_SITE_OTHER): Admitting: Adult Health

## 2021-10-17 ENCOUNTER — Ambulatory Visit (INDEPENDENT_AMBULATORY_CARE_PROVIDER_SITE_OTHER)

## 2021-10-17 ENCOUNTER — Encounter: Payer: Self-pay | Admitting: Adult Health

## 2021-10-17 VITALS — BP 122/80 | HR 82 | Temp 98.1°F | Ht 63.0 in | Wt 192.4 lb

## 2021-10-17 DIAGNOSIS — R55 Syncope and collapse: Secondary | ICD-10-CM

## 2021-10-17 DIAGNOSIS — R0683 Snoring: Secondary | ICD-10-CM

## 2021-10-17 DIAGNOSIS — R0602 Shortness of breath: Secondary | ICD-10-CM

## 2021-10-17 DIAGNOSIS — I10 Essential (primary) hypertension: Secondary | ICD-10-CM

## 2021-10-17 NOTE — Assessment & Plan Note (Signed)
Patient with episode of presyncopal possible vasovagal episode on June 8 during stressful event.  Records have been requested from [facility].  Discussed getting repeat labs today however patient declines.  Patient does have a strong family history of heart disease with both parents passing away in their 81s from heart attack.  She also has hypertension and hyperlipidemia along with obesity.  Will refer to cardiology for further evaluation. Patient is advised on increased fluid intake.  Change positions slowly.,  Stress reducers. Advised to follow-up with her primary care provider for ongoing blood pressure management and labs  Plan  Patient Instructions  Home sleep study when available  Healthy sleep regimen  Decrease screen time  Decrease caffeine , none after 2 pm .  Work on healthy weight  Follow up with PCP for hypertension   Refer to cardiology for presyncopal episode.   Chest xray today.   Follow up after sleep study to discuss test results and treatment plan .

## 2021-10-17 NOTE — Patient Instructions (Signed)
Home sleep study when available  Healthy sleep regimen  Decrease screen time  Decrease caffeine , none after 2 pm .  Work on healthy weight  Follow up with PCP for hypertension   Refer to cardiology for presyncopal episode.   Chest xray today.   Follow up after sleep study to discuss test results and treatment plan .

## 2021-10-17 NOTE — Progress Notes (Signed)
_0  ID: April Gonzalez, female    DOB: May 18, 1960, 61 y.o.   MRN: [id number]  Chief Complaint  Patient presents with   Follow-up    Referring provider: [name], NP  HPI: 61 year old female seen for sleep consult Sep 05, 2021 for snoring, daytime sleepiness and restless sleep.  TEST/EVENTS :   10/17/2021 Follow up  Patient returns for follow-up visit.  Patient was seen for 2023 for sleep evaluation.  She was having snoring, restless sleep and daytime sleepiness.  She was set up for home sleep study unfortunately this has not been completed to date.  We have contacted our schedulers to check on status for sleep study.  Patient does complain that on June 8 that she was out of town in [place] visiting her daughter in the hospital.  Her daughter is a Personnel officer in the [employer] she has her PhD with social work and was undergoing a hysterectomy.  She was with several family members.  Patient says she got startled when she went back to see her daughter after surgery as her daughter was having difficulty coming out of anesthesia.  She became upset and then felt faint.  She had an episode where she felt lightheaded, urged to go to the bathroom with some hyperventilation.  Patient was taken [facility] emergency room at [facility].  Records have been requested.  She says she had lab work and EKG and was told that they were all okay.  She was felt that she had a vasovagal episode.  She did not receive a chest x-ray.  Patient says she did have some chest tightness.  She has a strong family history of heart disease her mother and father both died in their 65s from a heart attack.  She has hypertension.  She says she has been out of her lisinopril for several months.  Has been checking her blood pressure and has not noticed any elevated pressure.  She also has hyperlipidemia that is diet managed. Patient is a never smoker.  Patient says she has not had any further episodes since  last week. She denies any headache, visual changes, arm weakness, tachycardia, edema, orthopnea. Says she just still feels a little off cannot describe exactly what is wrong but just does not feel herself.   Allergies  Allergen Reactions   Methimazole Anaphylaxis    EMS had to be called and a 7-day's stay in the hospital resulted   Nitroglycerin Other (See Comments)    Sweating    Other Itching and Other (See Comments)    Seasonal allergies & dog hair = runny nose also   Tetracycline Hcl Hypertension   Promethazine Nausea Only    Made the patient feel like she was going to vomit and it "spaced" her out   Shrimp [Shellfish Allergy] Itching   Latex Rash   Penicillins Other (See Comments)    Has patient had a PCN reaction causing immediate rash, facial/tongue/throat swelling, SOB or lightheadedness with hypotension: Unk Has patient had a PCN reaction causing severe rash involving mucus membranes or skin necrosis: Unk Has patient had a PCN reaction that required hospitalization: Unk Has patient had a PCN reaction occurring within the last 10 years: Yes If all of the above answers are "NO", then may proceed with Cephalosporin use.    Tape Rash   Terbutaline Other (See Comments)    Reaction not recalled (??)    Immunization History  Administered Date(s) Administered   Influenza,inj,Quad PF,6+  Mos 12/30/2013, 03/07/2017, 02/13/2019, 07/14/2020   MMR 06/03/2013, 07/02/2013   PFIZER(Purple Top)SARS-COV-2 Vaccination 07/22/2019, 08/16/2019, 02/19/2020   Td 05/06/2006   Tdap 04/13/2003, 03/07/2005   Zoster Recombinat (Shingrix) 01/26/2021, 07/05/2021    Past Medical History:  Diagnosis Date   Allergy    Anemia 05/2012   transfusion   Fibroids    H/O myomectomy   GERD (gastroesophageal reflux disease)    rare    H/O uterine prolapse    History of blood transfusion    x 3    Hyperlipidemia    Hypertension    Rectal bleeding    NOTED AT pv 01-09-17- PT STATES with most BM's for  the last 2 years, also hurts    Seizures ([place])    last seizure age 71 or 10 per pt report-01-09-17 no change in this   Thyroid disorder     Tobacco History: Social History   Tobacco Use  Smoking Status Never  Smokeless Tobacco Never   Counseling given: Not Answered   Outpatient Medications Prior to Visit  Medication Sig Dispense Refill   acetaminophen (TYLENOL) 500 MG tablet Take 500-1,000 mg by mouth every 6 (six) hours as needed for mild pain or headache. (Patient not taking: Reported on 10/17/2021)     doxycycline (VIBRAMYCIN) 100 MG capsule Take 1 capsule (100 mg total) by mouth 2 (two) times daily. (Patient not taking: Reported on 09/05/2021) 14 capsule 0   EPINEPHrine (EPIPEN 2-PAK) 0.3 mg/0.3 mL IJ SOAJ injection Inject 0.3 mLs (0.3 mg total) into the muscle once for 1 dose. If you develop difficulty breathing 0.3 mL 0   lisinopril (ZESTRIL) 20 MG tablet TAKE ONE TABLET BY MOUTH DAILY (Patient not taking: Reported on 09/05/2021) 60 tablet 0   omeprazole (PRILOSEC) 40 MG capsule Take 1 capsule (40 mg total) by mouth daily. (Patient not taking: Reported on 09/05/2021) 30 capsule 1   ondansetron (ZOFRAN) 4 MG tablet Take 1 tablet (4 mg total) by mouth every 8 (eight) hours as needed for nausea or vomiting. (Patient not taking: Reported on 09/05/2021) 30 tablet 1   PEG-KCl-NaCl-NaSulf-Na Asc-C (PLENVU) 140 g SOLR Take 1 kit by mouth as directed. Manufacturer's coupon Universal coupon code:BIN: P2366821; GROUP: [id number]; PCN: [id number]; ID: [id number]; PAY NO MORE $50; NO prior authorization (Patient not taking: Reported on 09/05/2021) 1 each 0   predniSONE (STERAPRED UNI-PAK 21 TAB) 10 MG (21) TBPK tablet Take as directed (Patient not taking: Reported on 09/05/2021) 21 tablet 0   triamcinolone cream (KENALOG) 0.1 % Apply 1 application topically 2 (two) times daily. (Patient not taking: Reported on 10/17/2021) 30 g 0   No facility-administered medications prior to visit.     Review of Systems:    Constitutional:   No  weight loss, night sweats,  Fevers, chills, +fatigue, or  lassitude.  HEENT:   No headaches,  Difficulty swallowing,  Tooth/dental problems, or  Sore throat,                No sneezing, itching, ear ache, nasal congestion, post nasal drip,   CV:  No chest pain,  Orthopnea, PND, swelling in lower extremities, anasarca, dizziness, palpitations, syncope.   GI  No heartburn, indigestion, abdominal pain, nausea, vomiting, diarrhea, change in bowel habits, loss of appetite, bloody stools.   Resp: No shortness of breath with exertion or at rest.  No excess mucus, no productive cough,  No non-productive cough,  No coughing up of blood.  No change in color of mucus.  No wheezing.  No chest wall deformity  Skin: no rash or lesions.  GU: no dysuria, change in color of urine, no urgency or frequency.  No flank pain, no hematuria   MS:  No joint pain or swelling.  No decreased range of motion.  No back pain.    Physical Exam  BP 122/80 (BP Location: Left Arm, Cuff Size: Large)   Pulse 82   Temp 98.1 F (36.7 C) (Temporal)   Ht 5' 3" (1.6 m)   Wt 192 lb 6.4 oz (87.3 kg)   LMP 12/03/2012   SpO2 96%   BMI 34.08 kg/m   GEN: A/Ox3; pleasant , NAD, well nourished    HEENT:  [place]/AT,  NOSE-clear, THROAT-clear, no lesions, no postnasal drip or exudate noted.   NECK:  Supple w/ fair ROM; no JVD; normal carotid impulses w/o bruits; no thyromegaly or nodules palpated; no lymphadenopathy.    RESP  Clear  P & A; w/o, wheezes/ rales/ or rhonchi. no accessory muscle use, no dullness to percussion  CARD:  RRR, no m/r/g, no peripheral edema, pulses intact, no cyanosis or clubbing.  GI:   Soft & nt; nml bowel sounds; no organomegaly or masses detected.   Musco: Warm bil, no deformities or joint swelling noted.   Neuro: alert, no focal deficits noted.  Cranial nerves II through XII are intact, normal grips.  Normal gait.  Skin: Warm, no lesions or rashes    Lab  Results:  CBC    Component Value Date/Time   WBC 5.8 06/22/2021 1020   RBC 5.09 06/22/2021 1020   HGB 14.4 06/22/2021 1020   HCT 44.0 06/22/2021 1020   PLT 198.0 06/22/2021 1020   MCV 86.6 06/22/2021 1020   MCH 29.5 06/17/2020 1104   MCHC 32.8 06/22/2021 1020   RDW 13.2 06/22/2021 1020   LYMPHSABS 1.7 06/22/2021 1020   MONOABS 0.4 06/22/2021 1020   EOSABS 0.2 06/22/2021 1020   BASOSABS 0.0 06/22/2021 1020    BMET    Component Value Date/Time   NA 137 06/27/2021 1107   K 4.4 06/27/2021 1107   CL 103 06/27/2021 1107   CO2 31 06/27/2021 1107   GLUCOSE 87 06/27/2021 1107   BUN 11 06/27/2021 1107   CREATININE 0.82 06/27/2021 1107   CALCIUM 10.0 06/27/2021 1107   GFRNONAA >60 06/17/2020 1104   GFRAA >60 12/03/2019 1757    BNP No results found for: "BNP"  ProBNP No results found for: "PROBNP"  Imaging: No results found.        No data to display          No results found for: "NITRICOXIDE"      Assessment & Plan:   Snoring Snoring, daytime sleepiness, restless sleep all suspicious for underlying sleep apnea.  Home sleep study has been ordered and pending.  We will contact our coordinators to check on status.  Patient is to follow-up for sleep study as soon as available.  And will follow back up in the office for results  Vasovagal near-syncope Patient with episode of presyncopal possible vasovagal episode on June 8 during stressful event.  Records have been requested from [facility].  Discussed getting repeat labs today however patient declines.  Patient does have a strong family history of heart disease with both parents passing away in their 25s from heart attack.  She also has hypertension and hyperlipidemia along with obesity.  Will refer to cardiology for further evaluation. Patient is advised on increased  fluid intake.  Change positions slowly.,  Stress reducers. Advised to follow-up with her primary care provider for ongoing blood  pressure management and labs  Plan  Patient Instructions  Home sleep study when available  Healthy sleep regimen  Decrease screen time  Decrease caffeine , none after 2 pm .  Work on healthy weight  Follow up with PCP for hypertension   Refer to cardiology for presyncopal episode.   Chest xray today.   Follow up after sleep study to discuss test results and treatment plan .      Essential hypertension Blood pressure is normal today in the office.  She has been off of her lisinopril for several months.  Vies her to follow-up with her primary care provider for ongoing blood pressure management.   I spent  42  minutes dedicated to the care of this patient on the date of this encounter to include pre-visit review of records, face-to-face time with the patient discussing conditions above, post visit ordering of testing, clinical documentation with the electronic health record, making appropriate referrals as documented, and communicating necessary findings to members of the patients care team.   Rexene Edison, NP 10/17/2021

## 2021-10-17 NOTE — Assessment & Plan Note (Signed)
Blood pressure is normal today in the office.  She has been off of her lisinopril for several months.  Vies her to follow-up with her primary care provider for ongoing blood pressure management.

## 2021-10-17 NOTE — Assessment & Plan Note (Signed)
Snoring, daytime sleepiness, restless sleep all suspicious for underlying sleep apnea.  Home sleep study has been ordered and pending.  We will contact our coordinators to check on status.  Patient is to follow-up for sleep study as soon as available.  And will follow back up in the office for results

## 2021-10-19 NOTE — Telephone Encounter (Signed)
error 

## 2021-10-23 ENCOUNTER — Ambulatory Visit (INDEPENDENT_AMBULATORY_CARE_PROVIDER_SITE_OTHER): Admitting: Cardiovascular Disease

## 2021-10-23 ENCOUNTER — Encounter: Payer: Self-pay | Admitting: *Deleted

## 2021-10-23 ENCOUNTER — Encounter: Payer: Self-pay | Admitting: Cardiovascular Disease

## 2021-10-23 DIAGNOSIS — I10 Essential (primary) hypertension: Secondary | ICD-10-CM

## 2021-10-23 DIAGNOSIS — E782 Mixed hyperlipidemia: Secondary | ICD-10-CM

## 2021-10-23 DIAGNOSIS — Z8249 Family history of ischemic heart disease and other diseases of the circulatory system: Secondary | ICD-10-CM

## 2021-10-23 DIAGNOSIS — R55 Syncope and collapse: Secondary | ICD-10-CM

## 2021-10-23 NOTE — Assessment & Plan Note (Signed)
Long history of vasovagal syncope most recently 2 weeks ago when her daughter was having surgery in [place].  She had 3 episodes all over the last decade.  Symptoms are classic.  No further work-up is required.

## 2021-10-23 NOTE — Assessment & Plan Note (Signed)
History of hyperlipidemia not on statin therapy.  We will check a calcium lipid liver profile.

## 2021-10-23 NOTE — Patient Instructions (Addendum)
Medication Instructions:  Your physician recommends that you continue on your current medications as directed. Please refer to the Current Medication list given to you today.  *If you need a refill on your cardiac medications before your next appointment, please call your pharmacy*   Lab Work: Your provider would like for you to return in 1-2 weeks to have the following labs drawn: FASTING lipid/liver profile. You do not need an appointment for the lab. Once in our office lobby there is a podium where you can sign in and ring the doorbell to alert [place] that you are here. The lab is open from 8:00 am to 4 pm; closed for lunch from 12:45pm-1:45pm.  You may also go to any of these [facility] locations:   [place]) - [place] [address] [place] [address] - [address] [place] - [address] ([facility]   If you have labs (blood work) drawn today and your tests are completely normal, you will receive your results only by: [employer] (if you have MyChart) OR A paper copy in the mail If you have any lab test that is abnormal or we need to change your treatment, we will call you to review the results.   Testing/Procedures: Dr. Gwenlyn Found has ordered a CT coronary calcium score.   Test location:  [facility] [place], [place] [address])   This is $99 out of pocket.   Coronary CalciumScan A coronary calcium scan is an imaging test used to look for deposits of calcium and other fatty materials (plaques) in the inner lining of the blood vessels of the heart (coronary arteries). These deposits of calcium and plaques can partly clog and narrow the coronary arteries without producing any symptoms or warning signs.  This puts a person at risk for a heart attack. This test can detect these deposits before symptoms develop. Tell a health care provider about: Any allergies you have. All medicines you are taking, including vitamins, herbs, eye drops, creams, and over-the-counter medicines. Any problems you or family members have had with anesthetic medicines. Any blood disorders you have. Any surgeries you have had. Any medical conditions you have. Whether you are pregnant or may be pregnant. What are the risks? Generally, this is a safe procedure. However, problems may occur, including: Harm to a pregnant woman and her unborn baby. This test involves the use of radiation. Radiation exposure can be dangerous to a pregnant woman and her unborn baby. If you are pregnant, you generally should not have this procedure done. Slight increase in the risk of cancer. This is because of the radiation involved in the test. What happens before the procedure? No preparation is needed for this procedure. What happens during the procedure? You will undress and remove any jewelry around your neck or chest. You will put on a hospital gown. Sticky electrodes will be placed on your chest. The electrodes will be connected to an electrocardiogram (ECG) machine to record a tracing of the electrical activity of your heart. A CT scanner will take pictures of your heart. During this time, you will be asked to lie still and hold your breath for 2-3 seconds while a  picture of your heart is being taken. The procedure may vary among health care providers and hospitals. What happens after the procedure? You can get dressed. You can return to your normal activities. It is up to you to get the results of your test. Ask your health care provider, or the department that is doing the test, when your results will be ready. Summary A coronary calcium scan is an imaging test used to look for deposits of calcium and other fatty materials  (plaques) in the inner lining of the blood vessels of the heart (coronary arteries). Generally, this is a safe procedure. Tell your health care provider if you are pregnant or may be pregnant. No preparation is needed for this procedure. A CT scanner will take pictures of your heart. You can return to your normal activities after the scan is done. This information is not intended to replace advice given to you by your health care provider. Make sure you discuss any questions you have with your health care provider. Document Released: 10/19/2007 Document Revised: 03/11/2016 Document Reviewed: 03/11/2016 Elsevier Interactive Patient Education  2017 [place]: At [facility], you and your health needs are our priority.  As part of our continuing mission to provide you with exceptional heart care, we have created designated Provider Care Teams.  These Care Teams include your primary Cardiologist (physician) and Advanced Practice Providers (APPs -  Physician Assistants and Nurse Practitioners) who all work together to provide you with the care you need, when you need it.  We recommend signing up for the patient portal called "MyChart".  Sign up information is provided on this After Visit Summary.  MyChart is used to connect with patients for Virtual Visits (Telemedicine).  Patients are able to view lab/test results, encounter notes, upcoming appointments, etc.  Non-urgent messages can be sent to your provider as well.   To learn more about what you can do with MyChart, go to [email or web address].    Your next appointment:   We will see you on an as needed basis.  Provider:   Quay Burow, MD

## 2021-10-23 NOTE — Assessment & Plan Note (Signed)
History of essential hypertension blood pressure measured today at 130/88.  She is not on antihypertensive medications.

## 2021-10-23 NOTE — Assessment & Plan Note (Signed)
Father died of a myocardial infarction, brother had CABG. I am going to get a coronary calcium score to risk stratify her.

## 2021-10-23 NOTE — Progress Notes (Signed)
10/23/2021 April Gonzalez   November 07, 1960  [id number]  Primary Physician [name], NP Primary Cardiologist: Lorretta Harp MD Lupe Car[place], [place]  HPI:  April Gonzalez is a 60 y.o. mildly overweight married female mother of 2, grandmother of 4 grandchildren referred by Rexene Edison PA-C (nurse practitioner for pulmonary) because of episodes of vasovagal syncope.  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  Her only cardiac risk factor is family history with a father who died of a myocardial infarction in brother who had bypass surgery.  She is never had a heart attack or stroke.  She does have atypical chest pain which sounds musculoskeletal.  She had a long history of vasovagal syncope with 3 episodes of over the last 10 years most recently 2 weeks ago when her daughter was having surgery in [place].   Current Meds  Medication Sig   acetaminophen (TYLENOL) 500 MG tablet Take 500-1,000 mg by mouth every 6 (six) hours as needed for mild pain or headache.     Allergies  Allergen Reactions   Methimazole Anaphylaxis    EMS had to be called and a 7-day's stay in the hospital resulted   Nitroglycerin Other (See Comments)    Sweating    Other Itching and Other (See Comments)    Seasonal allergies & dog hair = runny nose also   Tetracycline Hcl Hypertension   Promethazine Nausea Only    Made the patient feel like she was going to vomit and it "spaced" her out   Shrimp [Shellfish Allergy] Itching   Latex Rash   Penicillins Other (See Comments)    Has patient had a PCN reaction causing immediate rash, facial/tongue/throat swelling, SOB or lightheadedness with hypotension: Unk Has patient had a PCN reaction causing severe rash involving mucus membranes or skin necrosis: Unk Has patient had a PCN reaction that required hospitalization: Unk Has patient had a PCN reaction occurring within the last 10 years: Yes If all of the above answers are "NO", then may  proceed with Cephalosporin use.    Tape Rash   Terbutaline Other (See Comments)    Reaction not recalled (??)    Social History   Socioeconomic History   Marital status: Married    Spouse name: Not on file   Number of children: 2   Years of education: 15   Highest education level: Not on file  Occupational History   Not on file  Tobacco Use   Smoking status: Never   Smokeless tobacco: Never  Substance and Sexual Activity   Alcohol use: Yes    Alcohol/week: 0.0 standard drinks of alcohol    Comment: rare   Drug use: No   Sexual activity: Yes    Birth control/protection: Surgical    Comment: BTL   Other Topics Concern   Not on file  Social History Narrative   She works for [employer]    Married   Has 2 children    Social Determinants of Radio broadcast assistant Strain: Not on file  Food Insecurity: Not on file  Transportation Needs: Not on file  Physical Activity: Not on file  Stress: Not on file  Social Connections: Not on file  Intimate Partner Violence: Not on file     Review of Systems: General: negative for chills, fever, night sweats or weight changes.  Cardiovascular: negative for chest pain, dyspnea on exertion, edema, orthopnea, palpitations, paroxysmal nocturnal dyspnea or shortness of breath Dermatological:  negative for rash Respiratory: negative for cough or wheezing Urologic: negative for hematuria Abdominal: negative for nausea, vomiting, diarrhea, bright red blood per rectum, melena, or hematemesis Neurologic: negative for visual changes, syncope, or dizziness All other systems reviewed and are otherwise negative except as noted above.    Blood pressure 130/88, pulse 84, height '5\' 3"'$  (1.6 m), weight 195 lb 3.2 oz (88.5 kg), last menstrual period 12/03/2012, SpO2 99 %.  General appearance: alert and no distress Neck: no adenopathy, no carotid bruit, no JVD, supple, symmetrical, trachea midline, and thyroid not enlarged, symmetric, no  tenderness/mass/nodules Lungs: clear to auscultation bilaterally Heart: regular rate and rhythm, S1, S2 normal, no murmur, click, rub or gallop Extremities: extremities normal, atraumatic, no cyanosis or edema Pulses: 2+ and symmetric Skin: Skin color, texture, turgor normal. No rashes or lesions Neurologic: Grossly normal  EKG sinus rhythm at 84 with LVH voltage.  Personally reviewed this EKG.  ASSESSMENT AND PLAN:   Hyperlipidemia History of hyperlipidemia not on statin therapy.  We will check a calcium lipid liver profile.  Essential hypertension History of essential hypertension blood pressure measured today at 130/88.  She is not on antihypertensive medications.  Vasovagal near-syncope Long history of vasovagal syncope most recently 2 weeks ago when her daughter was having surgery in [place].  She had 3 episodes all over the last decade.  Symptoms are classic.  No further work-up is required.  Family history of heart disease Father died of a myocardial infarction, brother had CABG. I am going to get a coronary calcium score to risk stratify her.     Lorretta Harp MD FACP,FACC,FAHA, [facility] 10/23/2021 4:21 PM

## 2021-10-24 ENCOUNTER — Ambulatory Visit

## 2021-10-24 ENCOUNTER — Encounter

## 2021-10-24 ENCOUNTER — Telehealth: Payer: Self-pay | Admitting: Gastroenterology

## 2021-10-24 DIAGNOSIS — G4733 Obstructive sleep apnea (adult) (pediatric): Secondary | ICD-10-CM

## 2021-10-24 DIAGNOSIS — R0683 Snoring: Secondary | ICD-10-CM

## 2021-10-24 LAB — HM MAMMOGRAPHY

## 2021-10-24 NOTE — Telephone Encounter (Signed)
Thank you for the note.  This is her third reschedule of procedures and the last opportunity to do so.  - HD

## 2021-10-24 NOTE — Addendum Note (Signed)
Addended by: Merri Ray A on: 10/24/2021 02:12 PM   Modules accepted: Orders

## 2021-10-24 NOTE — Telephone Encounter (Signed)
Patient called stating she had oatmeal this morning and was advised to drink extra fluids for today but she requested to reschedule is now on for 11/27/21.

## 2021-10-25 ENCOUNTER — Encounter: Admitting: Gastroenterology

## 2021-10-25 ENCOUNTER — Telehealth: Payer: Self-pay

## 2021-10-25 ENCOUNTER — Other Ambulatory Visit: Payer: Self-pay | Admitting: Obstetrics and Gynecology

## 2021-10-25 DIAGNOSIS — E049 Nontoxic goiter, unspecified: Secondary | ICD-10-CM

## 2021-10-25 NOTE — Telephone Encounter (Signed)
Last documented mammogram 07/19/12. Pt states she just had one done. Pt consents that records can be requested from GYN.  No noted CPE in last year. Pt aware & appt made for Aug 3.  Upon review of chart, pt does NOT have DM & associated health maintenance topics will be removed. Pt will have to be directed to check her insurance for an ophthalmologist/dentist.

## 2021-10-26 DIAGNOSIS — G4733 Obstructive sleep apnea (adult) (pediatric): Secondary | ICD-10-CM | POA: Diagnosis not present

## 2021-10-30 ENCOUNTER — Ambulatory Visit (INDEPENDENT_AMBULATORY_CARE_PROVIDER_SITE_OTHER): Admitting: Internal Medicine

## 2021-10-30 ENCOUNTER — Encounter: Payer: Self-pay | Admitting: Internal Medicine

## 2021-10-30 VITALS — BP 118/78 | HR 75 | Temp 98.1°F | Ht 63.0 in | Wt 195.4 lb

## 2021-10-30 DIAGNOSIS — I10 Essential (primary) hypertension: Secondary | ICD-10-CM

## 2021-10-30 DIAGNOSIS — X153XXA Contact with hot saucepan or skillet, initial encounter: Secondary | ICD-10-CM | POA: Diagnosis not present

## 2021-10-30 DIAGNOSIS — T22112A Burn of first degree of left forearm, initial encounter: Secondary | ICD-10-CM

## 2021-10-30 MED ORDER — SILVER SULFADIAZINE 1 % EX CREA: 1.0000 | TOPICAL_CREAM | Freq: Every day | CUTANEOUS | 0 refills | Status: DC

## 2021-10-30 NOTE — Progress Notes (Signed)
Patient ID: April Gonzalez, female   DOB: 1960/10/18, 61 y.o.   MRN: [id number]        Chief Complaint: follow up left arm burn       HPI:  April Gonzalez is a 61 y.o. female here with c/o recent left arm burn while cooking with iron implements, accidental x 1 wk, was bright red to start, then improved but today with some clear fluid oozing after touching manipulating the improving red area Denies fever, chills, red streaks or other drainage.  Pt denies chest pain, increased sob or doe, wheezing, orthopnea, PND, increased LE swelling, palpitations, dizziness or syncope.         Wt Readings from Last 3 Encounters:  10/30/21 195 lb 6.4 oz (88.6 kg)  10/23/21 195 lb 3.2 oz (88.5 kg)  10/17/21 192 lb 6.4 oz (87.3 kg)   BP Readings from Last 3 Encounters:  10/30/21 118/78  10/23/21 130/88  10/17/21 122/80         Past Medical History:  Diagnosis Date   Allergy    Anemia 05/2012   transfusion   Fibroids    H/O myomectomy   GERD (gastroesophageal reflux disease)    rare    H/O uterine prolapse    History of blood transfusion    x 3    Hyperlipidemia    Hypertension    Rectal bleeding    NOTED AT pv 01-09-17- PT STATES with most BM's for the last 2 years, also hurts    Seizures ([place])    last seizure age 75 or 10 per pt report-01-09-17 no change in this   Thyroid disorder    Past Surgical History:  Procedure Laterality Date   ANKLE SURGERY     One plate, seven screws in the right ankle    BLADDER SUSPENSION N/A 12/10/2012   Procedure: TRANSVAGINAL TAPE (TVT) PROCEDURE;  Surgeon: Delice Lesch, MD;  Location: [facility];  Service: Gynecology;  Laterality: N/A;   CYSTOCELE REPAIR N/A 12/10/2012   Procedure: ANTERIOR REPAIR (CYSTOCELE);  Surgeon: Eldred Manges, MD;  Location: [facility];  Service: Gynecology;  Laterality: N/A;   CYSTOSCOPY N/A 12/10/2012   Procedure: CYSTOSCOPY;  Surgeon: Delice Lesch, MD;  Location: [facility];  Service: Gynecology;  Laterality: N/A;   EXCISION VAGINAL CYST      MYOMECTOMY  74YRS AGO   HYSTEROSCOPIC   TUBAL LIGATION  10/10/1991   VAGINAL HYSTERECTOMY N/A 12/10/2012   Procedure: Total Vaginal Hysterectomy;  Surgeon: Eldred Manges, MD;  Location: [facility];  Service: Gynecology;  Laterality: N/A;    reports that she has never smoked. She has never used smokeless tobacco. She reports current alcohol use. She reports that she does not use drugs. family history includes Arthritis in her mother; Breast cancer in her cousin and maternal aunt; Diabetes in her mother; Heart failure in her father; High blood pressure in her mother; Thyroid disease in her sister. Allergies  Allergen Reactions   Methimazole Anaphylaxis    EMS had to be called and a 7-day's stay in the hospital resulted   Nitroglycerin Other (See Comments)    Sweating    Other Itching and Other (See Comments)    Seasonal allergies & dog hair = runny nose also   Tetracycline Hcl Hypertension   Promethazine Nausea Only    Made the patient feel like she was going to vomit and it "spaced" her out   Shrimp [Shellfish Allergy] Itching   Latex Rash   Penicillins  Other (See Comments)    Has patient had a PCN reaction causing immediate rash, facial/tongue/throat swelling, SOB or lightheadedness with hypotension: Unk Has patient had a PCN reaction causing severe rash involving mucus membranes or skin necrosis: Unk Has patient had a PCN reaction that required hospitalization: Unk Has patient had a PCN reaction occurring within the last 10 years: Yes If all of the above answers are "NO", then may proceed with Cephalosporin use.    Tape Rash   Terbutaline Other (See Comments)    Reaction not recalled (??)   Current Outpatient Medications on File Prior to Visit  Medication Sig Dispense Refill   acetaminophen (TYLENOL) 500 MG tablet Take 500-1,000 mg by mouth every 6 (six) hours as needed for mild pain or headache.     EPINEPHrine (EPIPEN 2-PAK) 0.3 mg/0.3 mL IJ SOAJ injection Inject 0.3 mLs (0.3 mg  total) into the muscle once for 1 dose. If you develop difficulty breathing 0.3 mL 0   No current facility-administered medications on file prior to visit.        ROS:  All others reviewed and negative.  Objective        PE:  BP 118/78 (BP Location: Right Arm, Patient Position: Sitting, Cuff Size: Large)   Pulse 75   Temp 98.1 F (36.7 C) (Oral)   Ht '5\' 3"'$  (1.6 m)   Wt 195 lb 6.4 oz (88.6 kg)   LMP 12/03/2012   SpO2 97%   BMI 34.61 kg/m                 Constitutional: Pt appears in NAD               HENT: Head: NCAT.                Right Ear: External ear normal.                 Left Ear: External ear normal.                Eyes: . Pupils are equal, round, and reactive to light. Conjunctivae and EOM are normal               Nose: without d/c or deformity               Neck: Neck supple. Gross normal ROM               Cardiovascular: Normal rate and regular rhythm.                 Pulmonary/Chest: Effort normal and breath sounds without rales or wheezing.                               Neurological: Pt is alert. At baseline orientation, motor grossly intact               Skin: LE edema - none, left mid post arm with 3 cm area mild red, tender but non swelling area superficial burn without ulcer, red streaks, drainage               Psychiatric: Pt behavior is normal without agitation   Micro: none  Cardiac tracings I have personally interpreted today:  none  Pertinent Radiological findings (summarize): none   Lab Results  Component Value Date   WBC 5.8 06/22/2021   HGB 14.4 06/22/2021   HCT 44.0 06/22/2021   PLT 198.0 06/22/2021  GLUCOSE 87 06/27/2021   CHOL 231 (H) 04/11/2016   TRIG 153.0 (H) 04/11/2016   HDL 41.90 04/11/2016   LDLDIRECT 139.0 03/15/2015   LDLCALC 159 (H) 04/11/2016   ALT 14 06/27/2021   AST 13 06/27/2021   NA 137 06/27/2021   K 4.4 06/27/2021   CL 103 06/27/2021   CREATININE 0.82 06/27/2021   BUN 11 06/27/2021   CO2 31 06/27/2021   TSH 2.25  06/20/2020   INR 1.02 01/18/2017   HGBA1C 5.6 06/21/2014   Assessment/Plan:  April Gonzalez is a 61 y.o. Black or African American [2] female with  has a past medical history of Allergy, Anemia (05/2012), Fibroids, GERD (gastroesophageal reflux disease), H/O uterine prolapse, History of blood transfusion, Hyperlipidemia, Hypertension, Rectal bleeding, Seizures ([place]), and Thyroid disorder.  First degree burn of arm Mild but with ? Worsening drainage, for silvadene cream prn,  to f/u any worsening symptoms or concerns  Essential hypertension BP Readings from Last 3 Encounters:  10/30/21 118/78  10/23/21 130/88  10/17/21 122/80   Stable, pt to continue medical treatment  - diet, wt control, excercise  Followup: Return if symptoms worsen or fail to improve.  Cathlean Cower, MD 11/02/2021 8:18 PM [facility]

## 2021-11-01 ENCOUNTER — Inpatient Hospital Stay (HOSPITAL_BASED_OUTPATIENT_CLINIC_OR_DEPARTMENT_OTHER): Admission: RE | Admit: 2021-11-01 | Source: Ambulatory Visit

## 2021-11-01 ENCOUNTER — Ambulatory Visit (HOSPITAL_BASED_OUTPATIENT_CLINIC_OR_DEPARTMENT_OTHER): Admission: RE | Admit: 2021-11-01 | Source: Ambulatory Visit

## 2021-11-02 DIAGNOSIS — T2210XA Burn of first degree of shoulder and upper limb, except wrist and hand, unspecified site, initial encounter: Secondary | ICD-10-CM | POA: Insufficient documentation

## 2021-11-02 NOTE — Assessment & Plan Note (Signed)
BP Readings from Last 3 Encounters:  10/30/21 118/78  10/23/21 130/88  10/17/21 122/80   Stable, pt to continue medical treatment  - diet, wt control, excercise

## 2021-11-02 NOTE — Assessment & Plan Note (Signed)
Mild but with ? Worsening drainage, for silvadene cream prn,  to f/u any worsening symptoms or concerns

## 2021-11-05 ENCOUNTER — Encounter: Payer: Self-pay | Admitting: *Deleted

## 2021-11-05 ENCOUNTER — Telehealth: Payer: Self-pay | Admitting: Adult Health

## 2021-11-05 NOTE — Progress Notes (Signed)
Normal results letter sent to patient.  Nothing further needed.

## 2021-11-05 NOTE — Telephone Encounter (Signed)
ATC x1.  No answer.  VM full.

## 2021-11-05 NOTE — Telephone Encounter (Signed)
Home sleep study done on October 24, 2021 showed moderate obstructive sleep apnea with AHI 22.5/hour and SPO2 low at 84%.  Recommend beginning CPAP at bedtime.  If she is okay with this then begin CPAP AutoSet 5 to 15 cm H2O.  Mask of choice.  Enroll in [place].  Office visit in 2 to 3 months to evaluate her on CPAP with an in office visit. If not can set up an office visit to discuss her sleep study results and go over treatment options

## 2021-11-12 ENCOUNTER — Encounter: Payer: Self-pay | Admitting: *Deleted

## 2021-11-12 NOTE — Telephone Encounter (Signed)
ATC x2, no answer.  VM full.  Unable to reach letter sent.

## 2021-11-15 ENCOUNTER — Other Ambulatory Visit (HOSPITAL_COMMUNITY)

## 2021-11-15 ENCOUNTER — Ambulatory Visit (HOSPITAL_COMMUNITY)

## 2021-11-15 ENCOUNTER — Encounter (HOSPITAL_COMMUNITY): Payer: Self-pay

## 2021-11-21 ENCOUNTER — Encounter: Payer: Self-pay | Admitting: Adult Health

## 2021-11-26 ENCOUNTER — Telehealth: Payer: Self-pay | Admitting: Gastroenterology

## 2021-11-27 ENCOUNTER — Encounter: Admitting: Gastroenterology

## 2021-11-27 NOTE — Telephone Encounter (Signed)
For documentation purposes:    Multiple procedure cancellations (see 10/24/21 phone note as well).  She may not reschedule this procedure.  Yes, assess short-notice cancellation fee.  - HD

## 2021-12-06 ENCOUNTER — Ambulatory Visit (HOSPITAL_COMMUNITY)
Admission: RE | Admit: 2021-12-06 | Discharge: 2021-12-06 | Disposition: A | Discharge status: Home / Self Care | Source: Ambulatory Visit | Attending: Cardiovascular Disease | Admitting: Cardiovascular Disease

## 2021-12-06 ENCOUNTER — Encounter: Payer: Self-pay | Admitting: Adult Health

## 2021-12-06 ENCOUNTER — Ambulatory Visit (INDEPENDENT_AMBULATORY_CARE_PROVIDER_SITE_OTHER): Admitting: Adult Health

## 2021-12-06 VITALS — BP 124/86 | HR 80 | Temp 98.7°F | Ht 64.0 in | Wt 192.0 lb

## 2021-12-06 DIAGNOSIS — Z1159 Encounter for screening for other viral diseases: Secondary | ICD-10-CM | POA: Diagnosis not present

## 2021-12-06 DIAGNOSIS — E782 Mixed hyperlipidemia: Secondary | ICD-10-CM

## 2021-12-06 DIAGNOSIS — I10 Essential (primary) hypertension: Secondary | ICD-10-CM | POA: Diagnosis present

## 2021-12-06 DIAGNOSIS — Z114 Encounter for screening for human immunodeficiency virus [HIV]: Secondary | ICD-10-CM | POA: Diagnosis not present

## 2021-12-06 DIAGNOSIS — G4733 Obstructive sleep apnea (adult) (pediatric): Secondary | ICD-10-CM

## 2021-12-06 DIAGNOSIS — Z8249 Family history of ischemic heart disease and other diseases of the circulatory system: Secondary | ICD-10-CM

## 2021-12-06 DIAGNOSIS — Z0000 Encounter for general adult medical examination without abnormal findings: Secondary | ICD-10-CM

## 2021-12-06 LAB — COMPREHENSIVE METABOLIC PANEL
ALT: 17 U/L (ref 0–35)
AST: 15 U/L (ref 0–37)
Albumin: 4.4 g/dL (ref 3.5–5.2)
Alkaline Phosphatase: 93 U/L (ref 39–117)
BUN: 15 mg/dL (ref 6–23)
CO2: 24 mEq/L (ref 19–32)
Calcium: 9.7 mg/dL (ref 8.4–10.5)
Chloride: 104 mEq/L (ref 96–112)
Creatinine, Ser: 0.8 mg/dL (ref 0.40–1.20)
GFR: 80.6 mL/min (ref >60.00)
Glucose, Bld: 87 mg/dL (ref 70–99)
Potassium: 3.9 mEq/L (ref 3.5–5.1)
Sodium: 139 mEq/L (ref 135–145)
Total Bilirubin: 0.4 mg/dL (ref 0.2–1.2)
Total Protein: 7.9 g/dL (ref 6.0–8.3)

## 2021-12-06 LAB — CBC WITH DIFFERENTIAL/PLATELET
Basophils Absolute: 0 10*3/uL (ref 0.0–0.1)
Basophils Relative: 0.8 % (ref 0.0–3.0)
Eosinophils Absolute: 0.2 10*3/uL (ref 0.0–0.7)
Eosinophils Relative: 3 % (ref 0.0–5.0)
HCT: 43.6 % (ref 36.0–46.0)
Hemoglobin: 14.4 g/dL (ref 12.0–15.0)
Lymphocytes Relative: 34.6 % (ref 12.0–46.0)
Lymphs Abs: 1.9 10*3/uL (ref 0.7–4.0)
MCHC: 33.1 g/dL (ref 30.0–36.0)
MCV: 86.8 fl (ref 78.0–100.0)
Monocytes Absolute: 0.5 10*3/uL (ref 0.1–1.0)
Monocytes Relative: 8.4 % (ref 3.0–12.0)
Neutro Abs: 2.9 10*3/uL (ref 1.4–7.7)
Neutrophils Relative %: 53.2 % (ref 43.0–77.0)
Platelets: 202 10*3/uL (ref 150.0–400.0)
RBC: 5.01 Mil/uL (ref 3.87–5.11)
RDW: 13.1 % (ref 11.5–15.5)
WBC: 5.5 10*3/uL (ref 4.0–10.5)

## 2021-12-06 LAB — HEMOGLOBIN A1C: Hgb A1c MFr Bld: 5.9 % (ref 4.6–6.5)

## 2021-12-06 LAB — LIPID PANEL
Cholesterol: 266 mg/dL — ABNORMAL HIGH (ref 0–200)
HDL: 46.5 mg/dL (ref >39.00)
LDL Cholesterol: 188 mg/dL — ABNORMAL HIGH (ref 0–99)
NonHDL: 219.73
Total CHOL/HDL Ratio: 6
Triglycerides: 161 mg/dL — ABNORMAL HIGH (ref 0.0–149.0)
VLDL: 32.2 mg/dL (ref 0.0–40.0)

## 2021-12-06 LAB — TSH: TSH: 2.95 u[IU]/mL (ref 0.35–5.50)

## 2021-12-06 NOTE — Patient Instructions (Signed)
It was great seeing you today   We will follow up with you regarding your lab work   Please let me know if you need anything   

## 2021-12-06 NOTE — Progress Notes (Signed)
 Subjective:    Patient ID: Jody [name], female    DOB: 06/16/1960, 61 y.o.   MRN: [id number]  HPI Patient presents for yearly preventative medicine examination. She is a pleasant 61 year old female who  has a past medical history of Allergy, Anemia (05/2012), Fibroids, GERD (gastroesophageal reflux disease), H/O uterine prolapse, History of blood transfusion, Hyperlipidemia, Hypertension, Rectal bleeding, Seizures (HCC), and Thyroid disorder.    OSA -slightly had a home sleep study done on May 26, 2021 which showed moderate obstructive sleep apnea with AHI 22.5 an hour and SPO2 low 84.  It was recommended that she begin CPAP at bedtime. She has not gotten her CPAP yet.   Hypertension -she has been prescribed lisinopril 20 mg daily in the past, stopped taking this a few months ago her blood pressure has remained relatively stable. BP Readings from Last 3 Encounters:  12/06/21 (!) 140/70  10/30/21 118/78  10/23/21 130/88   Hyperlipidemia - not currently on statin therapy. Refused in the past  Lab Results  Component Value Date   CHOL 231 (H) 04/11/2016   HDL 41.90 04/11/2016   LDLCALC 159 (H) 04/11/2016   LDLDIRECT 139.0 03/15/2015   TRIG 153.0 (H) 04/11/2016   CHOLHDL 6 04/11/2016     All immunizations and health maintenance protocols were reviewed with the patient and needed orders were placed.  Appropriate screening laboratory values were ordered for the patient including screening of hyperlipidemia, renal function and hepatic function.  Medication reconciliation,  past medical history, social history, problem list and allergies were reviewed in detail with the patient  Goals were established with regard to weight loss, exercise, and  diet in compliance with medications  She is due this year for a colonoscopy. She is up to date on routine colon cancer screening   Review of Systems  Constitutional: Negative.   HENT: Negative.    Eyes: Negative.   Respiratory:  Negative.    Cardiovascular: Negative.   Gastrointestinal: Negative.   Endocrine: Negative.   Genitourinary: Negative.   Musculoskeletal: Negative.   Skin: Negative.   Allergic/Immunologic: Negative.   Neurological: Negative.   Hematological: Negative.   Psychiatric/Behavioral: Negative.     Past Medical History:  Diagnosis Date   Allergy    Anemia 05/2012   transfusion   Fibroids    H/O myomectomy   GERD (gastroesophageal reflux disease)    rare    H/O uterine prolapse    History of blood transfusion    x 3    Hyperlipidemia    Hypertension    Rectal bleeding    NOTED AT pv 01-09-17- PT STATES with most BM's for the last 2 years, also hurts    Seizures (HCC)    last seizure age 9 or 10 per pt report-01-09-17 no change in this   Thyroid disorder     Social History   Socioeconomic History   Marital status: Married    Spouse name: Not on file   Number of children: 2   Years of education: 15   Highest education level: Not on file  Occupational History   Not on file  Tobacco Use   Smoking status: Never   Smokeless tobacco: Never  Substance and Sexual Activity   Alcohol use: Yes    Alcohol/week: 0.0 standard drinks of alcohol    Comment: rare   Drug use: No   Sexual activity: Yes    Birth control/protection: Surgical    Comment: BTL   Other Topics Concern     Not on file  Social History Narrative   She works for [employer]    Married   Has 2 children    Social Determinants of Health   Financial Resource Strain: Not on file  Food Insecurity: Not on file  Transportation Needs: Not on file  Physical Activity: Not on file  Stress: Not on file  Social Connections: Not on file  Intimate Partner Violence: Not on file    Past Surgical History:  Procedure Laterality Date   ANKLE SURGERY     One plate, seven screws in the right ankle    BLADDER SUSPENSION N/A 12/10/2012   Procedure: TRANSVAGINAL TAPE (TVT) PROCEDURE;  Surgeon: Angela Y Roberts, MD;  Location: [facility];  Service: Gynecology;  Laterality: N/A;   CYSTOCELE REPAIR N/A 12/10/2012   Procedure: ANTERIOR REPAIR (CYSTOCELE);  Surgeon: Vanessa P Haygood, MD;  Location: [facility];  Service: Gynecology;  Laterality: N/A;   CYSTOSCOPY N/A 12/10/2012   Procedure: CYSTOSCOPY;  Surgeon: Angela Y Roberts, MD;  Location: [facility];  Service: Gynecology;  Laterality: N/A;   EXCISION VAGINAL CYST     MYOMECTOMY  10YRS AGO   HYSTEROSCOPIC   TUBAL LIGATION  10/10/1991   VAGINAL HYSTERECTOMY N/A 12/10/2012   Procedure: Total Vaginal Hysterectomy;  Surgeon: Vanessa P Haygood, MD;  Location: [facility];  Service: Gynecology;  Laterality: N/A;    Family History  Problem Relation Age of Onset   Heart failure Father    Diabetes Mother    High blood pressure Mother    Arthritis Mother    Breast cancer Maternal Aunt        in her 50's    Breast cancer Cousin    Thyroid disease Sister    Colon cancer Neg Hx    Colon polyps Neg Hx    Esophageal cancer Neg Hx    Rectal cancer Neg Hx    Stomach cancer Neg Hx     Allergies  Allergen Reactions   Methimazole Anaphylaxis    EMS had to be called and a 7-day's stay in the hospital resulted   Nitroglycerin Other (See Comments)    Sweating    Other Itching and Other (See Comments)    Seasonal allergies & dog hair = runny nose also   Tetracycline Hcl Hypertension   Promethazine Nausea Only    Made the patient feel like she was going to vomit and it "spaced" her out   Shrimp [Shellfish Allergy] Itching   Latex Rash   Penicillins Other (See Comments)    Has patient had a PCN reaction causing immediate rash, facial/tongue/throat swelling, SOB or lightheadedness with hypotension: Unk Has patient had a PCN reaction causing severe rash involving mucus membranes or skin necrosis: Unk Has patient had a PCN reaction that required hospitalization: Unk Has patient had a PCN reaction occurring within the last 10 years: Yes If all of the above answers are "NO", then may proceed with  Cephalosporin use.    Tape Rash   Terbutaline Other (See Comments)    Reaction not recalled (??)    Current Outpatient Medications on File Prior to Visit  Medication Sig Dispense Refill   acetaminophen (TYLENOL) 500 MG tablet Take 500-1,000 mg by mouth every 6 (six) hours as needed for mild pain or headache.     EPINEPHrine (EPIPEN 2-PAK) 0.3 mg/0.3 mL IJ SOAJ injection Inject 0.3 mLs (0.3 mg total) into the muscle once for 1 dose. If you develop difficulty breathing 0.3 mL 0     silver sulfADIAZINE (SILVADENE) 1 % cream Apply 1 Application topically daily. 50 g 0   No current facility-administered medications on file prior to visit.    BP (!) 140/70   Pulse 80   Temp 98.7 F (37.1 C) (Oral)   Ht 5' 4" (1.626 m)   Wt 192 lb (87.1 kg)   LMP 12/03/2012   SpO2 97%   BMI 32.96 kg/m       Objective:   Physical Exam Vitals and nursing note reviewed.  Constitutional:      General: She is not in acute distress.    Appearance: Normal appearance. She is well-developed. She is obese. She is not ill-appearing.  HENT:     Head: Normocephalic and atraumatic.     Right Ear: Tympanic membrane, ear canal and external ear normal. There is no impacted cerumen.     Left Ear: Tympanic membrane, ear canal and external ear normal. There is no impacted cerumen.     Nose: Nose normal. No congestion or rhinorrhea.     Mouth/Throat:     Mouth: Mucous membranes are moist.     Pharynx: Oropharynx is clear. No oropharyngeal exudate or posterior oropharyngeal erythema.  Eyes:     General:        Right eye: No discharge.        Left eye: No discharge.     Extraocular Movements: Extraocular movements intact.     Conjunctiva/sclera: Conjunctivae normal.     Pupils: Pupils are equal, round, and reactive to light.  Neck:     Thyroid: No thyromegaly.     Vascular: No carotid bruit.     Trachea: No tracheal deviation.  Cardiovascular:     Rate and Rhythm: Normal rate and regular rhythm.     Pulses:  Normal pulses.     Heart sounds: Normal heart sounds. No murmur heard.    No friction rub. No gallop.  Pulmonary:     Effort: Pulmonary effort is normal. No respiratory distress.     Breath sounds: Normal breath sounds. No stridor. No wheezing, rhonchi or rales.  Chest:     Chest wall: No tenderness.  Abdominal:     General: Abdomen is flat. Bowel sounds are normal. There is no distension.     Palpations: Abdomen is soft. There is no mass.     Tenderness: There is no abdominal tenderness. There is no right CVA tenderness, left CVA tenderness, guarding or rebound.     Hernia: No hernia is present.  Musculoskeletal:        General: No swelling, tenderness, deformity or signs of injury. Normal range of motion.     Cervical back: Normal range of motion and neck supple.     Right lower leg: No edema.     Left lower leg: No edema.  Lymphadenopathy:     Cervical: No cervical adenopathy.  Skin:    General: Skin is warm and dry.     Coloration: Skin is not jaundiced or pale.     Findings: No bruising, erythema, lesion or rash.  Neurological:     General: No focal deficit present.     Mental Status: She is alert and oriented to person, place, and time.     Cranial Nerves: No cranial nerve deficit.     Sensory: No sensory deficit.     Motor: No weakness.     Coordination: Coordination normal.     Gait: Gait normal.     Deep Tendon Reflexes: Reflexes   normal.  Psychiatric:        Mood and Affect: Mood normal.        Behavior: Behavior normal.        Thought Content: Thought content normal.        Judgment: Judgment normal.        Assessment & Plan:   1. Routine general medical examination at a health care facility - Continue to work on weight loss through diet and exercise - Follow up in one year or sooner if needed - CBC with Differential/Platelet; Future - Comprehensive metabolic panel; Future - Hemoglobin A1c; Future - Lipid panel; Future - TSH; Future  2. Need for  hepatitis C screening test  - Hep C Antibody; Future  3. Encounter for screening for HIV  - HIV Antibody (routine testing w rflx); Future  4. Essential hypertension - normotensive. Continue to monitor. No need for medication at this time  - CBC with Differential/Platelet; Future - Comprehensive metabolic panel; Future - Hemoglobin A1c; Future - Lipid panel; Future - TSH; Future  5. OSA (obstructive sleep apnea) - Call Pulmonary and get CPAP - CBC with Differential/Platelet; Future - Comprehensive metabolic panel; Future - Hemoglobin A1c; Future - Lipid panel; Future - TSH; Future  6. Mixed hyperlipidemia - likely need to be on statin  - Has Cardiac Calcium CT today  - CBC with Differential/Platelet; Future - Comprehensive metabolic panel; Future - Hemoglobin A1c; Future - Lipid panel; Future - TSH; Future  Cory Nafziger, NP  

## 2021-12-06 NOTE — Progress Notes (Signed)
Subjective:    Patient ID: April Gonzalez, female    DOB: 1960/11/25, 61 y.o.   MRN: [id number]  HPI Patient presents for yearly preventative medicine examination. She is a pleasant 61 year old female who  has a past medical history of Allergy, Anemia (05/2012), Fibroids, GERD (gastroesophageal reflux disease), H/O uterine prolapse, History of blood transfusion, Hyperlipidemia, Hypertension, Rectal bleeding, Seizures ([place]), and Thyroid disorder.    OSA -slightly had a home sleep study done on May 26, 2021 which showed moderate obstructive sleep apnea with AHI 22.5 an hour and SPO2 low 84.  It was recommended that she begin CPAP at bedtime. She has not gotten her CPAP yet.   Hypertension -she has been prescribed lisinopril 20 mg daily in the past, stopped taking this a few months ago her blood pressure has remained relatively stable. BP Readings from Last 3 Encounters:  12/06/21 (!) 140/70  10/30/21 118/78  10/23/21 130/88   Hyperlipidemia - not currently on statin therapy. Refused in the past  Lab Results  Component Value Date   CHOL 231 (H) 04/11/2016   HDL 41.90 04/11/2016   LDLCALC 159 (H) 04/11/2016   LDLDIRECT 139.0 03/15/2015   TRIG 153.0 (H) 04/11/2016   CHOLHDL 6 04/11/2016     All immunizations and health maintenance protocols were reviewed with the patient and needed orders were placed.  Appropriate screening laboratory values were ordered for the patient including screening of hyperlipidemia, renal function and hepatic function.  Medication reconciliation,  past medical history, social history, problem list and allergies were reviewed in detail with the patient  Goals were established with regard to weight loss, exercise, and  diet in compliance with medications  She is due this year for a colonoscopy. She is up to date on routine colon cancer screening   Review of Systems  Constitutional: Negative.   HENT: Negative.    Eyes: Negative.   Respiratory:  Negative.    Cardiovascular: Negative.   Gastrointestinal: Negative.   Endocrine: Negative.   Genitourinary: Negative.   Musculoskeletal: Negative.   Skin: Negative.   Allergic/Immunologic: Negative.   Neurological: Negative.   Hematological: Negative.   Psychiatric/Behavioral: Negative.     Past Medical History:  Diagnosis Date   Allergy    Anemia 05/2012   transfusion   Fibroids    H/O myomectomy   GERD (gastroesophageal reflux disease)    rare    H/O uterine prolapse    History of blood transfusion    x 3    Hyperlipidemia    Hypertension    Rectal bleeding    NOTED AT pv 01-09-17- PT STATES with most BM's for the last 2 years, also hurts    Seizures ([place])    last seizure age 50 or 10 per pt report-01-09-17 no change in this   Thyroid disorder     Social History   Socioeconomic History   Marital status: Married    Spouse name: Not on file   Number of children: 2   Years of education: 15   Highest education level: Not on file  Occupational History   Not on file  Tobacco Use   Smoking status: Never   Smokeless tobacco: Never  Substance and Sexual Activity   Alcohol use: Yes    Alcohol/week: 0.0 standard drinks of alcohol    Comment: rare   Drug use: No   Sexual activity: Yes    Birth control/protection: Surgical    Comment: BTL   Other Topics Concern  Not on file  Social History Narrative   She works for [employer]    Married   Has 2 children    Social Determinants of Health   Financial Resource Strain: Not on file  Food Insecurity: Not on file  Transportation Needs: Not on file  Physical Activity: Not on file  Stress: Not on file  Social Connections: Not on file  Intimate Partner Violence: Not on file    Past Surgical History:  Procedure Laterality Date   ANKLE SURGERY     One plate, seven screws in the right ankle    BLADDER SUSPENSION N/A 12/10/2012   Procedure: TRANSVAGINAL TAPE (TVT) PROCEDURE;  Surgeon: Delice Lesch, MD;  Location: [facility];  Service: Gynecology;  Laterality: N/A;   CYSTOCELE REPAIR N/A 12/10/2012   Procedure: ANTERIOR REPAIR (CYSTOCELE);  Surgeon: Eldred Manges, MD;  Location: [facility];  Service: Gynecology;  Laterality: N/A;   CYSTOSCOPY N/A 12/10/2012   Procedure: CYSTOSCOPY;  Surgeon: Delice Lesch, MD;  Location: [facility];  Service: Gynecology;  Laterality: N/A;   EXCISION VAGINAL CYST     MYOMECTOMY  66YRS AGO   HYSTEROSCOPIC   TUBAL LIGATION  10/10/1991   VAGINAL HYSTERECTOMY N/A 12/10/2012   Procedure: Total Vaginal Hysterectomy;  Surgeon: Eldred Manges, MD;  Location: [facility];  Service: Gynecology;  Laterality: N/A;    Family History  Problem Relation Age of Onset   Heart failure Father    Diabetes Mother    High blood pressure Mother    Arthritis Mother    Breast cancer Maternal Aunt        in her 44's    Breast cancer Cousin    Thyroid disease Sister    Colon cancer Neg Hx    Colon polyps Neg Hx    Esophageal cancer Neg Hx    Rectal cancer Neg Hx    Stomach cancer Neg Hx     Allergies  Allergen Reactions   Methimazole Anaphylaxis    EMS had to be called and a 7-day's stay in the hospital resulted   Nitroglycerin Other (See Comments)    Sweating    Other Itching and Other (See Comments)    Seasonal allergies & dog hair = runny nose also   Tetracycline Hcl Hypertension   Promethazine Nausea Only    Made the patient feel like she was going to vomit and it "spaced" her out   Shrimp [Shellfish Allergy] Itching   Latex Rash   Penicillins Other (See Comments)    Has patient had a PCN reaction causing immediate rash, facial/tongue/throat swelling, SOB or lightheadedness with hypotension: Unk Has patient had a PCN reaction causing severe rash involving mucus membranes or skin necrosis: Unk Has patient had a PCN reaction that required hospitalization: Unk Has patient had a PCN reaction occurring within the last 10 years: Yes If all of the above answers are "NO", then may proceed with  Cephalosporin use.    Tape Rash   Terbutaline Other (See Comments)    Reaction not recalled (??)    Current Outpatient Medications on File Prior to Visit  Medication Sig Dispense Refill   acetaminophen (TYLENOL) 500 MG tablet Take 500-1,000 mg by mouth every 6 (six) hours as needed for mild pain or headache.     EPINEPHrine (EPIPEN 2-PAK) 0.3 mg/0.3 mL IJ SOAJ injection Inject 0.3 mLs (0.3 mg total) into the muscle once for 1 dose. If you develop difficulty breathing 0.3 mL 0  silver sulfADIAZINE (SILVADENE) 1 % cream Apply 1 Application topically daily. 50 g 0   No current facility-administered medications on file prior to visit.    BP (!) 140/70   Pulse 80   Temp 98.7 F (37.1 C) (Oral)   Ht '5\' 4"'$  (1.626 m)   Wt 192 lb (87.1 kg)   LMP 12/03/2012   SpO2 97%   BMI 32.96 kg/m       Objective:   Physical Exam Vitals and nursing note reviewed.  Constitutional:      General: She is not in acute distress.    Appearance: Normal appearance. She is well-developed. She is obese. She is not ill-appearing.  HENT:     Head: Normocephalic and atraumatic.     Right Ear: Tympanic membrane, ear canal and external ear normal. There is no impacted cerumen.     Left Ear: Tympanic membrane, ear canal and external ear normal. There is no impacted cerumen.     Nose: Nose normal. No congestion or rhinorrhea.     Mouth/Throat:     Mouth: Mucous membranes are moist.     Pharynx: Oropharynx is clear. No oropharyngeal exudate or posterior oropharyngeal erythema.  Eyes:     General:        Right eye: No discharge.        Left eye: No discharge.     Extraocular Movements: Extraocular movements intact.     Conjunctiva/sclera: Conjunctivae normal.     Pupils: Pupils are equal, round, and reactive to light.  Neck:     Thyroid: No thyromegaly.     Vascular: No carotid bruit.     Trachea: No tracheal deviation.  Cardiovascular:     Rate and Rhythm: Normal rate and regular rhythm.     Pulses:  Normal pulses.     Heart sounds: Normal heart sounds. No murmur heard.    No friction rub. No gallop.  Pulmonary:     Effort: Pulmonary effort is normal. No respiratory distress.     Breath sounds: Normal breath sounds. No stridor. No wheezing, rhonchi or rales.  Chest:     Chest wall: No tenderness.  Abdominal:     General: Abdomen is flat. Bowel sounds are normal. There is no distension.     Palpations: Abdomen is soft. There is no mass.     Tenderness: There is no abdominal tenderness. There is no right CVA tenderness, left CVA tenderness, guarding or rebound.     Hernia: No hernia is present.  Musculoskeletal:        General: No swelling, tenderness, deformity or signs of injury. Normal range of motion.     Cervical back: Normal range of motion and neck supple.     Right lower leg: No edema.     Left lower leg: No edema.  Lymphadenopathy:     Cervical: No cervical adenopathy.  Skin:    General: Skin is warm and dry.     Coloration: Skin is not jaundiced or pale.     Findings: No bruising, erythema, lesion or rash.  Neurological:     General: No focal deficit present.     Mental Status: She is alert and oriented to person, place, and time.     Cranial Nerves: No cranial nerve deficit.     Sensory: No sensory deficit.     Motor: No weakness.     Coordination: Coordination normal.     Gait: Gait normal.     Deep Tendon Reflexes: Reflexes  normal.  Psychiatric:        Mood and Affect: Mood normal.        Behavior: Behavior normal.        Thought Content: Thought content normal.        Judgment: Judgment normal.        Assessment & Plan:   1. Routine general medical examination at a health care facility - Continue to work on weight loss through diet and exercise - Follow up in one year or sooner if needed - CBC with Differential/Platelet; Future - Comprehensive metabolic panel; Future - Hemoglobin A1c; Future - Lipid panel; Future - TSH; Future  2. Need for  hepatitis C screening test  - Hep C Antibody; Future  3. Encounter for screening for HIV  - HIV Antibody (routine testing w rflx); Future  4. Essential hypertension - normotensive. Continue to monitor. No need for medication at this time  - CBC with Differential/Platelet; Future - Comprehensive metabolic panel; Future - Hemoglobin A1c; Future - Lipid panel; Future - TSH; Future  5. OSA (obstructive sleep apnea) - Call Pulmonary and get CPAP - CBC with Differential/Platelet; Future - Comprehensive metabolic panel; Future - Hemoglobin A1c; Future - Lipid panel; Future - TSH; Future  6. Mixed hyperlipidemia - likely need to be on statin  - Has Cardiac Calcium CT today  - CBC with Differential/Platelet; Future - Comprehensive metabolic panel; Future - Hemoglobin A1c; Future - Lipid panel; Future - TSH; Future  [name], NP

## 2021-12-07 LAB — HIV ANTIBODY (ROUTINE TESTING W REFLEX): HIV 1&2 Ab, 4th Generation: NONREACTIVE

## 2021-12-07 LAB — HEPATITIS C ANTIBODY: Hepatitis C Ab: NONREACTIVE

## 2021-12-10 ENCOUNTER — Telehealth: Payer: Self-pay | Admitting: Adult Health

## 2021-12-10 NOTE — Telephone Encounter (Signed)
Pt called returning RN's call. RN was unavailable. Pt asked that RN call her back at her leisure.

## 2021-12-10 NOTE — Telephone Encounter (Signed)
Pt called inquiring about a referral for the  Cardiologist.  Pt would like a call back at [phone]     

## 2021-12-10 NOTE — Telephone Encounter (Signed)
Called pt to get clarification on needs as she saw the cardiologist 10/23/21

## 2021-12-10 NOTE — Telephone Encounter (Signed)
Called pt to get clarification on needs as she saw the cardiologist 10/23/21 

## 2021-12-10 NOTE — Telephone Encounter (Signed)
Pt called returning RN's call. RN was unavailable. Pt asked that RN call her back at her leisure. 

## 2021-12-10 NOTE — Telephone Encounter (Signed)
Pt called inquiring about a referral for the  Cardiologist.  Pt would like a call back at [phone]

## 2021-12-11 NOTE — Telephone Encounter (Signed)
Spoke to pt and she stated she was told to take a statin medication. Pt stated that she would like to know why she is needing to take this. Pt articulated that she saw a Cardiologist per Pulmonary doctor concerns. Pt stated that after she was advised to have lab work down for f/u. However, pt came to our office for CPE and requested labs were drawn here. Pt does not want to take statin medication if she can avoid it.

## 2021-12-11 NOTE — Telephone Encounter (Signed)
Pt would like to see a nutritionist first so that she can try and get her diet under control. Pt is wanting to avoid statins as much as possible.

## 2021-12-11 NOTE — Telephone Encounter (Signed)
Spoke to pt and she stated she was told to take a statin medication. Pt stated that she would like to know why she is needing to take this. Pt articulated that she saw a Cardiologist per Pulmonary doctor concerns. Pt stated that after she was advised to have lab work down for f/u. However, pt came to our office for CPE and requested labs were drawn here. Pt does not want to take statin medication if she can avoid it.  

## 2021-12-11 NOTE — Telephone Encounter (Signed)
Pt would like to see a nutritionist first so that she can try and get her diet under control. Pt is wanting to avoid statins as much as possible.   

## 2021-12-12 ENCOUNTER — Telehealth: Payer: Self-pay | Admitting: Adult Health

## 2021-12-12 ENCOUNTER — Other Ambulatory Visit: Payer: Self-pay | Admitting: Adult Health

## 2021-12-12 DIAGNOSIS — E782 Mixed hyperlipidemia: Secondary | ICD-10-CM

## 2021-12-12 DIAGNOSIS — G4733 Obstructive sleep apnea (adult) (pediatric): Secondary | ICD-10-CM

## 2021-12-12 NOTE — Telephone Encounter (Signed)
Noted  

## 2021-12-12 NOTE — Telephone Encounter (Signed)
[name], Jody S, NP      11/05/21  1:20 PM Note Home sleep study done on October 24, 2021 showed moderate obstructive sleep apnea with AHI 22.5/hour and SPO2 low at 84%.   Recommend beginning CPAP at bedtime.  If she is okay with this then begin CPAP AutoSet 5 to 15 cm H2O.  Mask of choice.  Enroll in [facility].  Office visit in 2 to 3 months to evaluate her on CPAP with an in office visit. If not can set up an office visit to discuss her sleep study results and go over treatment options      Called and spoke with pt letting her know the results of the sleep study and recs per TP and she verbalized understanding. Order placed for cpap start. Pt told to call the office once she receives her equipment to have appt scheduled for compliance. Nothing further needed. 

## 2021-12-12 NOTE — Telephone Encounter (Signed)
Pt os aware of he risk and would still like the referral placed.

## 2021-12-12 NOTE — Telephone Encounter (Signed)
Parrett, Fonnie Mu, NP      11/05/21  1:20 PM Note Home sleep study done on October 24, 2021 showed moderate obstructive sleep apnea with AHI 22.5/hour and SPO2 low at 84%.   Recommend beginning CPAP at bedtime.  If she is okay with this then begin CPAP AutoSet 5 to 15 cm H2O.  Mask of choice.  Enroll in [facility].  Office visit in 2 to 3 months to evaluate her on CPAP with an in office visit. If not can set up an office visit to discuss her sleep study results and go over treatment options      Called and spoke with pt letting her know the results of the sleep study and recs per TP and she verbalized understanding. Order placed for cpap start. Pt told to call the office once she receives her equipment to have appt scheduled for compliance. Nothing further needed.

## 2021-12-12 NOTE — Telephone Encounter (Signed)
Pt os aware of he risk and would still like the referral placed. 

## 2021-12-27 ENCOUNTER — Ambulatory Visit (HOSPITAL_BASED_OUTPATIENT_CLINIC_OR_DEPARTMENT_OTHER)
Admission: RE | Admit: 2021-12-27 | Discharge: 2021-12-27 | Disposition: A | Discharge status: Home / Self Care | Source: Ambulatory Visit | Attending: Obstetrics and Gynecology | Admitting: Obstetrics and Gynecology

## 2021-12-27 DIAGNOSIS — E049 Nontoxic goiter, unspecified: Secondary | ICD-10-CM | POA: Insufficient documentation

## 2021-12-28 ENCOUNTER — Telehealth: Payer: Self-pay | Admitting: Adult Health

## 2021-12-28 NOTE — Telephone Encounter (Signed)
Pt called to go over test results and medication  Pt asked that CMA call back at their earliest convenience.  [phone]

## 2021-12-28 NOTE — Telephone Encounter (Signed)
Called pt no answer. April Gonzalez uploaded the explanation from her labs on Mychart. Pt can refer to the message is unable to reach anyone clinical.

## 2021-12-28 NOTE — Telephone Encounter (Signed)
Pt requesting explaination of results from imaging that was ordered by another provider. Explained that it would be best to reach out to the ordering provider for an explanation.

## 2021-12-28 NOTE — Telephone Encounter (Signed)
Pt called to go over test results and medication  Pt asked that CMA call back at their earliest convenience.  [phone]  

## 2022-01-01 NOTE — Telephone Encounter (Addendum)
I called [employer] & spoke to [facility].  He states per notes insurance told [employer] no PA was required.  Pt had called them & stated her insurance told her Josem Kaufmann was required.  That is not what insurance told [employer].  Pt was scheduled for setup and rescheduled to today and then she didn't show.  I will send order to [facility].  Tried to call pt to make her aware but mailbox was full.

## 2022-01-01 NOTE — Telephone Encounter (Signed)
Spoke to [place] verbally to result imaging and he stated that nothing has changed. Will call pt to advise.

## 2022-01-01 NOTE — Telephone Encounter (Signed)
Called pt again no answer now VM full.

## 2022-01-01 NOTE — Telephone Encounter (Signed)
Called and spoke with patient.  Patient stated [employer] had told her that her insurance will not authorize cpap. Patient is requested a different DME for cpap.  Message routed to [facility]

## 2022-01-01 NOTE — Telephone Encounter (Signed)
Patient notified of update  and verbalized understanding. 

## 2022-01-23 ENCOUNTER — Other Ambulatory Visit: Payer: Self-pay | Admitting: Internal Medicine

## 2022-01-24 ENCOUNTER — Telehealth: Payer: Self-pay | Admitting: Adult Health

## 2022-01-24 NOTE — Telephone Encounter (Signed)
Pt call and stated she need a refill on silver sulfADIAZINE (SILVADENE) 1 % cream sent to  [facility] [id number] [place], [place] Phone:  [phone]  Fax:  [phone]

## 2022-01-25 NOTE — Telephone Encounter (Signed)
April Gonzalez has not refilled this. Tried to call pt no answer.

## 2022-01-29 ENCOUNTER — Other Ambulatory Visit: Payer: Self-pay

## 2022-01-29 MED ORDER — SILVER SULFADIAZINE 1 % EX CREA
1.0000 | TOPICAL_CREAM | Freq: Every day | CUTANEOUS | 0 refills | Status: AC
Start: 2022-01-29 — End: ?

## 2022-01-29 NOTE — Telephone Encounter (Signed)
Pt states she got slividene cream mixed up with the cream that she needs for the rash on her hands that she has had before. She is unable to recall the medication.

## 2022-01-30 ENCOUNTER — Other Ambulatory Visit: Payer: Self-pay | Admitting: Adult Health

## 2022-01-30 MED ORDER — CLOBETASOL PROPIONATE 0.05 % EX CREA: 1.0000 | TOPICAL_CREAM | Freq: Two times a day (BID) | CUTANEOUS | 1 refills | Status: DC

## 2022-02-01 NOTE — Telephone Encounter (Signed)
Patient was reached on 8/9 by [facility] CMA, orders placed.  Nothing further needed.

## 2022-02-17 ENCOUNTER — Encounter (HOSPITAL_COMMUNITY): Payer: Self-pay | Admitting: Emergency Medicine

## 2022-02-17 ENCOUNTER — Ambulatory Visit (HOSPITAL_COMMUNITY)
Admission: EM | Admit: 2022-02-17 | Discharge: 2022-02-17 | Disposition: A | Discharge status: Home / Self Care | Attending: Physician Assistant | Admitting: Physician Assistant

## 2022-02-17 DIAGNOSIS — R21 Rash and other nonspecific skin eruption: Secondary | ICD-10-CM

## 2022-02-17 MED ORDER — PREDNISONE 20 MG PO TABS: 40.0000 mg | ORAL_TABLET | Freq: Every day | ORAL | 0 refills | Status: AC

## 2022-02-17 NOTE — ED Triage Notes (Signed)
Pt reports red "bumps" primarily on right lower leg x 3 weeks. States the bumps started off very small and got bigger and has since then spread. Denies pain, very itchy.
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
## 2022-02-21 ENCOUNTER — Ambulatory Visit (INDEPENDENT_AMBULATORY_CARE_PROVIDER_SITE_OTHER)

## 2022-02-21 DIAGNOSIS — Z23 Encounter for immunization: Secondary | ICD-10-CM

## 2022-02-22 ENCOUNTER — Ambulatory Visit

## 2022-02-23 NOTE — Progress Notes (Signed)
Subjective Patient ID: Jody [name] is a 61 y.o. female presents to clinic for concerns of a sinus infection.  Patient states she has had about 14 days of symptoms and has tried multiple over-the-counter medications to include Sudafed, Flonase, Claritin, other cough and cold medications with no relief.  States she has new pain and discomfort right below her eyes.  She denies any known fevers or chills, wheezing, cough, chest pain, shortness of breath, abdominal pain, nausea, vomiting, diarrhea or rashes.  Patient reports she is also had a sinus headache.    The following portions of the patient's history were reviewed and updated as appropriate: allergies, current medications, past family history, past medical history, past social history, past surgical history and problem list.    Review of Systems   All other systems reviewed and are negative.        Objective    BP 170/93 (Site: Left arm, Position: Sitting)   Pulse 78   Temp 98.1 F (36.7 C) (Oral)   Resp 20   Ht 1.6 m (5\' 3" )   Wt 88.5 kg (195 lb)   SpO2 99%   BMI 34.54 kg/m     Physical Exam  Vitals and nursing note reviewed.   Constitutional:       General: She is not in acute distress.     Appearance: Normal appearance. She is not ill-appearing, toxic-appearing or diaphoretic.   HENT:      Head: Normocephalic and atraumatic.      Right Ear: Tympanic membrane and external ear normal.      Left Ear: Tympanic membrane and external ear normal.      Nose: Congestion and rhinorrhea present.      Mouth/Throat:      Mouth: Mucous membranes are moist.      Pharynx: Oropharynx is clear. No posterior oropharyngeal erythema.   Eyes:      Conjunctiva/sclera: Conjunctivae normal.      Pupils: Pupils are equal, round, and reactive to light.   Cardiovascular:      Rate and Rhythm: Normal rate and regular rhythm.      Pulses: Normal pulses.      Heart sounds: Normal heart sounds.   Pulmonary:      Effort: Pulmonary effort is normal.      Breath sounds: Normal  breath sounds.   Musculoskeletal:         General: Normal range of motion.      Cervical back: Normal range of motion and neck supple.   Skin:     General: Skin is warm and dry.      Capillary Refill: Capillary refill takes less than 2 seconds.      Findings: No rash.   Neurological:      General: No focal deficit present.      Mental Status: She is alert and oriented to person, place, and time.   Psychiatric:         Mood and Affect: Mood normal.         Behavior: Behavior normal.          Assessment    Encounter Diagnosis   Name Primary?   . Acute non-recurrent maxillary sinusitis Yes        Plan    Impression/Plan  1. Acute non-recurrent maxillary sinusitis  - cefdinir (OMNICEF) 300 MG capsule; Take 1 capsule (300 mg total) by mouth 2 times daily for 7 days.  Dispense: 14 capsule; Refill: 0  Concern for secondary bacterial  infection.  Omnicef sent to pharmacy on file as patient has penicillin and tetracycline allergies.  Yogurt or probiotic daily, take with food.  Can continue Flonase and Claritin.  At this point Sudafed has not helped the patient and is only elevating her blood pressure thus recommended discontinuing it.  Push plenty of fluids and rest.  Can use over-the-counter saline rinses as well.  Discussed strict return and ED precautions.  Patient to follow-up with primary care.      Patient has been instructed on prescription and/or over-the-counter medications, dosages, side effects, and possible interactions as associated with each diagnosis in my impression and plan above.    2.  I have educated patient on supportive care therapy associated with the diagnoses above.    3.  Patient was instructed on when to follow up and know that they can follow up here, with their PCP, Urgent Care, ED.  They have been instructed that if symptoms worse that should return to the clinic, go to the nearest ED, or activate EMS.    4.  Red Flags associated with their diagnoses were reviewed and patient was educated on what  to do if red flags develop.    5.  Patient agreed with plan and verbalized understanding.      Urgent Care Follow Up: Follow up with PCP    If labs were completed today, your lab(s) have been sent to our main campus laboratory.  Based on the testing ordered and lab volume it can take up to 3-5 days for results to return. We will call you with any results that require attention.  We do not call negative/normal results.  All results will be available in your [facility] account.     Discussed treatment plan with patient.  Patient verbalized understanding and had no questions upon discharge.  Patient stable and in NAD distress upon discharge from clinic.    Contact clinic with any questions or concerns.    Portions of this note have been dictated using Dragon voice to text software. Errors may be present despite proofreading.          Electronically signed by: Garen Lah, PA-C 02/23/2022 3:20 PM       Electronically signed by: Garen Lah, PA-C  02/23/22 1535

## 2022-02-27 ENCOUNTER — Ambulatory Visit (INDEPENDENT_AMBULATORY_CARE_PROVIDER_SITE_OTHER): Admitting: Family Medicine

## 2022-02-27 VITALS — BP 154/100 | HR 85 | Temp 97.6°F | Ht 64.0 in | Wt 199.3 lb

## 2022-02-27 DIAGNOSIS — J019 Acute sinusitis, unspecified: Secondary | ICD-10-CM

## 2022-02-27 MED ORDER — LEVOFLOXACIN 500 MG PO TABS: 500.0000 mg | ORAL_TABLET | Freq: Every day | ORAL | 0 refills | Status: DC

## 2022-02-27 NOTE — Progress Notes (Signed)
Established Patient Office Visit  Subjective   Patient ID: April Gonzalez, female    DOB: April 23, 1961  Age: 61 y.o. MRN: [id number]  Chief Complaint  Patient presents with   Follow-up   Sinusitis    Patient complains of sinusitis    Ear Drainage    Patient complains of Bi-lateral ear drainage, x3 weeks     HPI   Seen with about 3-week history of sinusitis symptoms.  She has had some daily headaches and facial pain especially over her ethmoid and maxillary sinus region.  She went to urgent care 4 days ago.  Was initially prescribed Omnicef but because of prior penicillin allergy this was switched to doxycycline.  She took this for about 3 days but had consistent nausea even when taking with food and has not taken any since the first 3 days.  She tried multiple over-the-counter medications including Sudafed, Flonase, and Claritin without relief.  She continues to have increased fatigue.  No fever.  No bloody nasal discharge.  Some colored nasal discharge.  No cough.  Multiple drug allergies which were reviewed.  Past Medical History:  Diagnosis Date   Allergy    Anemia 05/2012   transfusion   Fibroids    H/O myomectomy   GERD (gastroesophageal reflux disease)    rare    H/O uterine prolapse    History of blood transfusion    x 3    Hyperlipidemia    Hypertension    Rectal bleeding    NOTED AT pv 01-09-17- PT STATES with most BM's for the last 2 years, also hurts    Seizures ([place])    last seizure age 67 or 10 per pt report-01-09-17 no change in this   Thyroid disorder    Past Surgical History:  Procedure Laterality Date   ANKLE SURGERY     One plate, seven screws in the right ankle    BLADDER SUSPENSION N/A 12/10/2012   Procedure: TRANSVAGINAL TAPE (TVT) PROCEDURE;  Surgeon: Delice Lesch, MD;  Location: [facility];  Service: Gynecology;  Laterality: N/A;   CYSTOCELE REPAIR N/A 12/10/2012   Procedure: ANTERIOR REPAIR (CYSTOCELE);  Surgeon: Eldred Manges, MD;  Location: [facility];   Service: Gynecology;  Laterality: N/A;   CYSTOSCOPY N/A 12/10/2012   Procedure: CYSTOSCOPY;  Surgeon: Delice Lesch, MD;  Location: [facility];  Service: Gynecology;  Laterality: N/A;   EXCISION VAGINAL CYST     MYOMECTOMY  78YRS AGO   HYSTEROSCOPIC   TUBAL LIGATION  10/10/1991   VAGINAL HYSTERECTOMY N/A 12/10/2012   Procedure: Total Vaginal Hysterectomy;  Surgeon: Eldred Manges, MD;  Location: [facility];  Service: Gynecology;  Laterality: N/A;    reports that she has never smoked. She has never used smokeless tobacco. She reports current alcohol use. She reports that she does not use drugs. family history includes Arthritis in her mother; Breast cancer in her cousin and maternal aunt; Diabetes in her mother; Heart failure in her father; High blood pressure in her mother; Thyroid disease in her sister. Allergies  Allergen Reactions   Methimazole Anaphylaxis    EMS had to be called and a 7-day's stay in the hospital resulted   Nitroglycerin Other (See Comments)    Sweating    Other Itching and Other (See Comments)    Seasonal allergies & dog hair = runny nose also   Tetracycline Hcl Hypertension   Promethazine Nausea Only    Made the patient feel like she was going to  vomit and it "spaced" her out   Shrimp [Shellfish Allergy] Itching   Latex Rash   Penicillins Other (See Comments)    Has patient had a PCN reaction causing immediate rash, facial/tongue/throat swelling, SOB or lightheadedness with hypotension: Unk Has patient had a PCN reaction causing severe rash involving mucus membranes or skin necrosis: Unk Has patient had a PCN reaction that required hospitalization: Unk Has patient had a PCN reaction occurring within the last 10 years: Yes If all of the above answers are "NO", then may proceed with Cephalosporin use.    Tape Rash   Terbutaline Other (See Comments)    Reaction not recalled (??)    Review of Systems  Constitutional:  Positive for malaise/fatigue. Negative for  chills and fever.  HENT:  Positive for congestion and sinus pain. Negative for sore throat.   Respiratory:  Negative for cough.   Cardiovascular:  Negative for chest pain.  Neurological:  Positive for headaches.      Objective:     BP (!) 154/100 (BP Location: Left Arm, Patient Position: Sitting, Cuff Size: Normal)   Pulse 85   Temp 97.6 F (36.4 C) (Oral)   Ht '5\' 4"'$  (1.626 m)   Wt 199 lb 4.8 oz (90.4 kg)   LMP 12/03/2012   SpO2 96%   BMI 34.21 kg/m    Physical Exam Vitals reviewed.  Constitutional:      General: She is not in acute distress.    Appearance: Normal appearance. She is not ill-appearing.  HENT:     Right Ear: Tympanic membrane normal.     Left Ear: Tympanic membrane normal.     Mouth/Throat:     Pharynx: Oropharynx is clear. No oropharyngeal exudate.  Cardiovascular:     Rate and Rhythm: Normal rate and regular rhythm.  Pulmonary:     Effort: Pulmonary effort is normal.     Breath sounds: Normal breath sounds.  Musculoskeletal:     Cervical back: Neck supple.  Lymphadenopathy:     Cervical: No cervical adenopathy.  Neurological:     Mental Status: She is alert.      No results found for any visits on 02/27/22.    The 10-year ASCVD risk score (Arnett DK, et al., 2019) is: 25.6%    Assessment & Plan:   Problem List Items Addressed This Visit   None Visit Diagnoses     Acute non-recurrent sinusitis, unspecified location    -  Primary   Relevant Medications   levofloxacin (LEVAQUIN) 500 MG tablet     Patient has history of allergy to penicillin and recent intolerance to doxycycline.  We discussed frequent resistance to medication such as Zithromax.  We decided to go ahead with Levaquin 500 mg once daily for 7 days.  Reviewed potential side effects.  -Strongly advocated that she follow-up with her primary within the next few weeks to reassess blood pressure as this was up at urgent care and here again today.  No follow-ups on file.     Carolann Littler, MD

## 2022-02-27 NOTE — Progress Notes (Signed)
 Established Patient Office Visit  Subjective   Patient ID: Jody [name], female    DOB: 01/09/1961  Age: 61 y.o. MRN: [id number]  Chief Complaint  Patient presents with   Follow-up   Sinusitis    Patient complains of sinusitis    Ear Drainage    Patient complains of Bi-lateral ear drainage, x3 weeks     HPI   Seen with about 3-week history of sinusitis symptoms.  She has had some daily headaches and facial pain especially over her ethmoid and maxillary sinus region.  She went to urgent care 4 days ago.  Was initially prescribed Omnicef but because of prior penicillin allergy this was switched to doxycycline.  She took this for about 3 days but had consistent nausea even when taking with food and has not taken any since the first 3 days.  She tried multiple over-the-counter medications including Sudafed, Flonase, and Claritin without relief.  She continues to have increased fatigue.  No fever.  No bloody nasal discharge.  Some colored nasal discharge.  No cough.  Multiple drug allergies which were reviewed.  Past Medical History:  Diagnosis Date   Allergy    Anemia 05/2012   transfusion   Fibroids    H/O myomectomy   GERD (gastroesophageal reflux disease)    rare    H/O uterine prolapse    History of blood transfusion    x 3    Hyperlipidemia    Hypertension    Rectal bleeding    NOTED AT pv 01-09-17- PT STATES with most BM's for the last 2 years, also hurts    Seizures (HCC)    last seizure age 9 or 10 per pt report-01-09-17 no change in this   Thyroid disorder    Past Surgical History:  Procedure Laterality Date   ANKLE SURGERY     One plate, seven screws in the right ankle    BLADDER SUSPENSION N/A 12/10/2012   Procedure: TRANSVAGINAL TAPE (TVT) PROCEDURE;  Surgeon: Angela Y Roberts, MD;  Location: [facility];  Service: Gynecology;  Laterality: N/A;   CYSTOCELE REPAIR N/A 12/10/2012   Procedure: ANTERIOR REPAIR (CYSTOCELE);  Surgeon: Vanessa P Haygood, MD;  Location: [facility];   Service: Gynecology;  Laterality: N/A;   CYSTOSCOPY N/A 12/10/2012   Procedure: CYSTOSCOPY;  Surgeon: Angela Y Roberts, MD;  Location: [facility];  Service: Gynecology;  Laterality: N/A;   EXCISION VAGINAL CYST     MYOMECTOMY  10YRS AGO   HYSTEROSCOPIC   TUBAL LIGATION  10/10/1991   VAGINAL HYSTERECTOMY N/A 12/10/2012   Procedure: Total Vaginal Hysterectomy;  Surgeon: Vanessa P Haygood, MD;  Location: [facility];  Service: Gynecology;  Laterality: N/A;    reports that she has never smoked. She has never used smokeless tobacco. She reports current alcohol use. She reports that she does not use drugs. family history includes Arthritis in her mother; Breast cancer in her cousin and maternal aunt; Diabetes in her mother; Heart failure in her father; High blood pressure in her mother; Thyroid disease in her sister. Allergies  Allergen Reactions   Methimazole Anaphylaxis    EMS had to be called and a 7-day's stay in the hospital resulted   Nitroglycerin Other (See Comments)    Sweating    Other Itching and Other (See Comments)    Seasonal allergies & dog hair = runny nose also   Tetracycline Hcl Hypertension   Promethazine Nausea Only    Made the patient feel like she was going to   vomit and it "spaced" her out   Shrimp [Shellfish Allergy] Itching   Latex Rash   Penicillins Other (See Comments)    Has patient had a PCN reaction causing immediate rash, facial/tongue/throat swelling, SOB or lightheadedness with hypotension: Unk Has patient had a PCN reaction causing severe rash involving mucus membranes or skin necrosis: Unk Has patient had a PCN reaction that required hospitalization: Unk Has patient had a PCN reaction occurring within the last 10 years: Yes If all of the above answers are "NO", then may proceed with Cephalosporin use.    Tape Rash   Terbutaline Other (See Comments)    Reaction not recalled (??)    Review of Systems  Constitutional:  Positive for malaise/fatigue. Negative for  chills and fever.  HENT:  Positive for congestion and sinus pain. Negative for sore throat.   Respiratory:  Negative for cough.   Cardiovascular:  Negative for chest pain.  Neurological:  Positive for headaches.      Objective:     BP (!) 154/100 (BP Location: Left Arm, Patient Position: Sitting, Cuff Size: Normal)   Pulse 85   Temp 97.6 F (36.4 C) (Oral)   Ht 5' 4" (1.626 m)   Wt 199 lb 4.8 oz (90.4 kg)   LMP 12/03/2012   SpO2 96%   BMI 34.21 kg/m    Physical Exam Vitals reviewed.  Constitutional:      General: She is not in acute distress.    Appearance: Normal appearance. She is not ill-appearing.  HENT:     Right Ear: Tympanic membrane normal.     Left Ear: Tympanic membrane normal.     Mouth/Throat:     Pharynx: Oropharynx is clear. No oropharyngeal exudate.  Cardiovascular:     Rate and Rhythm: Normal rate and regular rhythm.  Pulmonary:     Effort: Pulmonary effort is normal.     Breath sounds: Normal breath sounds.  Musculoskeletal:     Cervical back: Neck supple.  Lymphadenopathy:     Cervical: No cervical adenopathy.  Neurological:     Mental Status: She is alert.      No results found for any visits on 02/27/22.    The 10-year ASCVD risk score (Arnett DK, et al., 2019) is: 25.6%    Assessment & Plan:   Problem List Items Addressed This Visit   None Visit Diagnoses     Acute non-recurrent sinusitis, unspecified location    -  Primary   Relevant Medications   levofloxacin (LEVAQUIN) 500 MG tablet     Patient has history of allergy to penicillin and recent intolerance to doxycycline.  We discussed frequent resistance to medication such as Zithromax.  We decided to go ahead with Levaquin 500 mg once daily for 7 days.  Reviewed potential side effects.  -Strongly advocated that she follow-up with her primary within the next few weeks to reassess blood pressure as this was up at urgent care and here again today.  No follow-ups on file.     Bruce Burchette, MD  

## 2022-03-01 ENCOUNTER — Telehealth: Payer: Self-pay

## 2022-03-01 NOTE — Telephone Encounter (Signed)
Please see previous message. Patient was just following up on new prescription being wrote for Lisinopril until she can see her pcp.       Please advise

## 2022-03-01 NOTE — Telephone Encounter (Signed)
Pt call stating she was directed to restart her lisinopril (ZESTRIL) 20 MG tablet   but it expired in august. Requesting another refill  [facility] [id number] [place], [place] Phone:  [phone]  Fax:  [phone]

## 2022-03-01 NOTE — Telephone Encounter (Signed)
Patient informed of the message and verbalized understanding. Patient advised to visit ED for any chest pain/ tightness.

## 2022-03-01 NOTE — Telephone Encounter (Signed)
Last OV with Dr. Elease Hashimoto- 02/27/22  Requesting new prescription for Lisinopril 20 mg Medication not on med list.   Pharmacy updated.

## 2022-03-01 NOTE — Telephone Encounter (Signed)
caller states her BP has been elevated for the past couple of days. current reading of 189/106. no fever.  03/01/2022 8:18:22 AM See PCP within 24 Hours Yes [name], RN, Joelene Millin  Referrals REFERRED TO PCP OFFICE  Has OV with Dr Elease Hashimoto on 02/27/22. See note below regarding HTN at this OV: -Strongly advocated that she follow-up with her primary within the next few weeks to reassess blood pressure as this was up at urgent care and here again today.  03/01/22 1032 - Pt states she's taking abx for sinus infection & OTC Tylenol & allergy pill. BP at this time is 184/104. Pt states she did have tea prior to taking BP. Denies h/a or chest pain. States she has some lisinopril left. Upon chart review, CPE on 12/06/21 notes regarding HTN:   Hypertension -she has been prescribed lisinopril 20 mg daily in the past, stopped taking this a few months ago her blood pressure has remained relatively stable  4. Essential hypertension - normotensive. Continue to monitor. No need for medication at this time   Will refer to Dr Elease Hashimoto if pt should resume Lisinopril at this time until evaluated by PCP on 03/08/22 at 3:30

## 2022-03-04 ENCOUNTER — Other Ambulatory Visit: Payer: Self-pay

## 2022-03-04 DIAGNOSIS — I10 Essential (primary) hypertension: Secondary | ICD-10-CM

## 2022-03-04 MED ORDER — LISINOPRIL 20 MG PO TABS: 20.0000 mg | ORAL_TABLET | Freq: Every day | ORAL | 1 refills | Status: DC

## 2022-03-04 NOTE — Telephone Encounter (Signed)
Spoke with patient. Lisinopril '20mg'$  sent to [facility] on [address].

## 2022-03-08 ENCOUNTER — Telehealth: Admitting: Adult Health

## 2022-03-12 ENCOUNTER — Encounter: Payer: Self-pay | Admitting: Adult Health

## 2022-03-12 ENCOUNTER — Telehealth (INDEPENDENT_AMBULATORY_CARE_PROVIDER_SITE_OTHER): Admitting: Adult Health

## 2022-03-12 VITALS — Ht 64.0 in | Wt 197.0 lb

## 2022-03-12 DIAGNOSIS — T7840XA Allergy, unspecified, initial encounter: Secondary | ICD-10-CM | POA: Diagnosis not present

## 2022-03-12 DIAGNOSIS — I10 Essential (primary) hypertension: Secondary | ICD-10-CM

## 2022-03-12 NOTE — Progress Notes (Signed)
Virtual Visit via Video Note  I connected with April Gonzalez  on 03/12/22 at  8:45 AM EST by a video enabled telemedicine application and verified that I am speaking with the correct person using two identifiers.  Location patient: home Location provider:work or home office Persons participating in the virtual visit: patient, provider  I discussed the limitations of evaluation and management by telemedicine and the availability of in person appointments. The patient expressed understanding and agreed to proceed.   HPI:  He was seen on 02/17/2022 at urgent care for a rash and then again in this office by another provider on 02/27/2022 for acute nonrecurrent sinusitis.  Both times her blood pressure was elevated and she was started on lisinopril 20 mg daily. She has been checking her blood pressure at home and reports that he her systolic has been 093'A. She has a few days of feeling " swimmy headed" but this seems to have resolved. She has been on the medication for about two weeks.  Denies dizziness, lightheadedness, or syncopal episodes  BP Readings from Last 3 Encounters:  02/27/22 (!) 154/100  02/17/22 (!) 152/97  12/06/21 124/86   She also reports that she finished her antibiotics for suspected sinusitis but continues to have ear pain, rhinorrhea, sinus pain and pressure.  She noticed that once she vacuumed the house (she has a husky that is currently shedding at summer coat) that her symptoms improved.  ROS: See pertinent positives and negatives per HPI.  Past Medical History:  Diagnosis Date   Allergy    Anemia 05/2012   transfusion   Fibroids    H/O myomectomy   GERD (gastroesophageal reflux disease)    rare    H/O uterine prolapse    History of blood transfusion    x 3    Hyperlipidemia    Hypertension    Rectal bleeding    NOTED AT pv 01-09-17- PT STATES with most BM's for the last 2 years, also hurts    Seizures ([place])    last seizure age 40 or 10 per pt report-01-09-17 no  change in this   Thyroid disorder     Past Surgical History:  Procedure Laterality Date   ANKLE SURGERY     One plate, seven screws in the right ankle    BLADDER SUSPENSION N/A 12/10/2012   Procedure: TRANSVAGINAL TAPE (TVT) PROCEDURE;  Surgeon: Delice Lesch, MD;  Location: [facility];  Service: Gynecology;  Laterality: N/A;   CYSTOCELE REPAIR N/A 12/10/2012   Procedure: ANTERIOR REPAIR (CYSTOCELE);  Surgeon: Eldred Manges, MD;  Location: [facility];  Service: Gynecology;  Laterality: N/A;   CYSTOSCOPY N/A 12/10/2012   Procedure: CYSTOSCOPY;  Surgeon: Delice Lesch, MD;  Location: [facility];  Service: Gynecology;  Laterality: N/A;   EXCISION VAGINAL CYST     MYOMECTOMY  21YRS AGO   HYSTEROSCOPIC   TUBAL LIGATION  10/10/1991   VAGINAL HYSTERECTOMY N/A 12/10/2012   Procedure: Total Vaginal Hysterectomy;  Surgeon: Eldred Manges, MD;  Location: [facility];  Service: Gynecology;  Laterality: N/A;    Family History  Problem Relation Age of Onset   Heart failure Father    Diabetes Mother    High blood pressure Mother    Arthritis Mother    Breast cancer Maternal Aunt        in her 32's    Breast cancer Cousin    Thyroid disease Sister    Colon cancer Neg Hx    Colon polyps  Neg Hx    Esophageal cancer Neg Hx    Rectal cancer Neg Hx    Stomach cancer Neg Hx        Current Outpatient Medications:    acetaminophen (TYLENOL) 500 MG tablet, Take 500-1,000 mg by mouth every 6 (six) hours as needed for mild pain or headache., Disp: , Rfl:    clobetasol cream (TEMOVATE) 0.26 %, Apply 1 Application topically 2 (two) times daily., Disp: 30 g, Rfl: 1   levofloxacin (LEVAQUIN) 500 MG tablet, Take 1 tablet (500 mg total) by mouth daily., Disp: 7 tablet, Rfl: 0   lisinopril (ZESTRIL) 20 MG tablet, Take 1 tablet (20 mg total) by mouth daily., Disp: 30 tablet, Rfl: 1   silver sulfADIAZINE (SILVADENE) 1 % cream, Apply 1 Application topically daily., Disp: 50 g, Rfl: 0   EPINEPHrine (EPIPEN 2-PAK) 0.3  mg/0.3 mL IJ SOAJ injection, Inject 0.3 mLs (0.3 mg total) into the muscle once for 1 dose. If you develop difficulty breathing, Disp: 0.3 mL, Rfl: 0  EXAM:  VITALS per patient if applicable:  GENERAL: alert, oriented, appears well and in no acute distress  HEENT: atraumatic, conjunttiva clear, no obvious abnormalities on inspection of external nose and ears  NECK: normal movements of the head and neck  LUNGS: on inspection no signs of respiratory distress, breathing rate appears normal, no obvious gross SOB, gasping or wheezing  CV: no obvious cyanosis  MS: moves all visible extremities without noticeable abnormality  PSYCH/NEURO: pleasant and cooperative, no obvious depression or anxiety, speech and thought processing grossly intact  ASSESSMENT AND PLAN:  Discussed the following assessment and plan:  1. Essential hypertension -Continue with lisinopril 20 mg daily, she will send me a MyChart message in about 4 weeks to let me know what her baseline blood pressure is  2. Allergy, initial encounter -[place] given on-call wound with allergy and asthma as well as [place] allergy and asthma, she needs to be tested for potential allergens causing her symptoms      I discussed the assessment and treatment plan with the patient. The patient was provided an opportunity to ask questions and all were answered. The patient agreed with the plan and demonstrated an understanding of the instructions.   The patient was advised to call back or seek an in-person evaluation if the symptoms worsen or if the condition fails to improve as anticipated.   [name], NP

## 2022-04-09 ENCOUNTER — Telehealth: Payer: Self-pay

## 2022-04-09 NOTE — Telephone Encounter (Signed)
---  States she has has a cough that started months ago and has worsened, worse at night. Cough is non productive, denies fever.  04/09/2022 8:45:26 AM See PCP within 24 Hours Conner, RN, Olin Hauser  Comments User: Harvin Hazel, RN Date/Time Eilene Ghazi Time): 04/09/2022 8:47:42 AM Caller states she has chest "soreness" that is consistent and has been present for a long time. States worsens with activity. Denies acute chest pain.  Referrals REFERRED TO PCP OFFICE  Pt has appt with PCP on 04/11/22

## 2022-04-11 ENCOUNTER — Encounter: Payer: Self-pay | Admitting: Adult Health

## 2022-04-11 ENCOUNTER — Ambulatory Visit (INDEPENDENT_AMBULATORY_CARE_PROVIDER_SITE_OTHER): Admitting: Adult Health

## 2022-04-11 VITALS — BP 140/100 | HR 82 | Temp 98.1°F | Ht 64.0 in | Wt 198.0 lb

## 2022-04-11 DIAGNOSIS — R053 Chronic cough: Secondary | ICD-10-CM | POA: Diagnosis not present

## 2022-04-11 DIAGNOSIS — I10 Essential (primary) hypertension: Secondary | ICD-10-CM | POA: Diagnosis not present

## 2022-04-11 MED ORDER — AMLODIPINE BESYLATE 10 MG PO TABS: 10.0000 mg | ORAL_TABLET | Freq: Every day | ORAL | 0 refills | Status: DC

## 2022-04-11 MED ORDER — AZELASTINE HCL 0.1 % NA SOLN: 2.0000 | Freq: Two times a day (BID) | NASAL | 6 refills | Status: DC

## 2022-04-11 NOTE — Progress Notes (Signed)
 Subjective:    Patient ID: Jody [name], female    DOB: 02/14/1961, 61 y.o.   MRN: [id number]  HPI  61 year old female who  has a past medical history of Allergy, Anemia (05/2012), Fibroids, GERD (gastroesophageal reflux disease), H/O uterine prolapse, History of blood transfusion, Hyperlipidemia, Hypertension, Rectal bleeding, Seizures (HCC), and Thyroid disorder.  She presents to the office today for chronic cough. She has had a semi productive intermittent cough x 6 months or so. In the past we have tried using PPI as it was thought that silent acid reflux may be the cause.   Her last chest xray was in 10/17/2021 which showed no active cardiopulmonary disease.   She had a sleep study done in 10/2022 which showed moderate sleep apnea. She has not been able to get her CPAP yet " The insurance company is fighting with lung doctors"   She feels as though her cough is getting worse. It is worse when she lays down but does have the cough when she is sitting up during the day. Sputum is white in color . She does have some PND. Is taking claritin   She denies any fevers, chills, shortness of breath.   Review of Systems See HPI   Past Medical History:  Diagnosis Date   Allergy    Anemia 05/2012   transfusion   Fibroids    H/O myomectomy   GERD (gastroesophageal reflux disease)    rare    H/O uterine prolapse    History of blood transfusion    x 3    Hyperlipidemia    Hypertension    Rectal bleeding    NOTED AT pv 01-09-17- PT STATES with most BM's for the last 2 years, also hurts    Seizures (HCC)    last seizure age 9 or 10 per pt report-01-09-17 no change in this   Thyroid disorder     Social History   Socioeconomic History   Marital status: Married    Spouse name: Not on file   Number of children: 2   Years of education: 15   Highest education level: Not on file  Occupational History   Not on file  Tobacco Use   Smoking status: Never   Smokeless tobacco: Never   Substance and Sexual Activity   Alcohol use: Yes    Alcohol/week: 0.0 standard drinks of alcohol    Comment: rare   Drug use: No   Sexual activity: Yes    Birth control/protection: Surgical    Comment: BTL   Other Topics Concern   Not on file  Social History Narrative   She works for [employer]    Married   Has 2 children    Social Determinants of Health   Financial Resource Strain: Not on file  Food Insecurity: Not on file  Transportation Needs: Not on file  Physical Activity: Not on file  Stress: Not on file  Social Connections: Not on file  Intimate Partner Violence: Not on file    Past Surgical History:  Procedure Laterality Date   ANKLE SURGERY     One plate, seven screws in the right ankle    BLADDER SUSPENSION N/A 12/10/2012   Procedure: TRANSVAGINAL TAPE (TVT) PROCEDURE;  Surgeon: Angela Y Roberts, MD;  Location: [facility];  Service: Gynecology;  Laterality: N/A;   CYSTOCELE REPAIR N/A 12/10/2012   Procedure: ANTERIOR REPAIR (CYSTOCELE);  Surgeon: Vanessa P Haygood, MD;  Location: [facility];  Service: Gynecology;  Laterality:   N/A;   CYSTOSCOPY N/A 12/10/2012   Procedure: CYSTOSCOPY;  Surgeon: Angela Y Roberts, MD;  Location: [facility];  Service: Gynecology;  Laterality: N/A;   EXCISION VAGINAL CYST     MYOMECTOMY  10YRS AGO   HYSTEROSCOPIC   TUBAL LIGATION  10/10/1991   VAGINAL HYSTERECTOMY N/A 12/10/2012   Procedure: Total Vaginal Hysterectomy;  Surgeon: Vanessa P Haygood, MD;  Location: [facility];  Service: Gynecology;  Laterality: N/A;    Family History  Problem Relation Age of Onset   Heart failure Father    Diabetes Mother    High blood pressure Mother    Arthritis Mother    Breast cancer Maternal Aunt        in her 50's    Breast cancer Cousin    Thyroid disease Sister    Colon cancer Neg Hx    Colon polyps Neg Hx    Esophageal cancer Neg Hx    Rectal cancer Neg Hx    Stomach cancer Neg Hx     Allergies  Allergen Reactions   Methimazole Anaphylaxis    EMS  had to be called and a 7-day's stay in the hospital resulted   Nitroglycerin Other (See Comments)    Sweating    Other Itching and Other (See Comments)    Seasonal allergies & dog hair = runny nose also   Tetracycline Hcl Hypertension   Promethazine Nausea Only    Made the patient feel like she was going to vomit and it "spaced" her out   Shrimp [Shellfish Allergy] Itching   Latex Rash   Penicillins Other (See Comments)    Has patient had a PCN reaction causing immediate rash, facial/tongue/throat swelling, SOB or lightheadedness with hypotension: Unk Has patient had a PCN reaction causing severe rash involving mucus membranes or skin necrosis: Unk Has patient had a PCN reaction that required hospitalization: Unk Has patient had a PCN reaction occurring within the last 10 years: Yes If all of the above answers are "NO", then may proceed with Cephalosporin use.    Tape Rash   Terbutaline Other (See Comments)    Reaction not recalled (??)    Current Outpatient Medications on File Prior to Visit  Medication Sig Dispense Refill   acetaminophen (TYLENOL) 500 MG tablet Take 500-1,000 mg by mouth every 6 (six) hours as needed for mild pain or headache.     clobetasol cream (TEMOVATE) 0.05 % Apply 1 Application topically 2 (two) times daily. 30 g 1   EPINEPHrine (EPIPEN 2-PAK) 0.3 mg/0.3 mL IJ SOAJ injection Inject 0.3 mLs (0.3 mg total) into the muscle once for 1 dose. If you develop difficulty breathing 0.3 mL 0   levofloxacin (LEVAQUIN) 500 MG tablet Take 1 tablet (500 mg total) by mouth daily. 7 tablet 0   silver sulfADIAZINE (SILVADENE) 1 % cream Apply 1 Application topically daily. 50 g 0   No current facility-administered medications on file prior to visit.    BP (!) 140/100   Pulse 82   Temp 98.1 F (36.7 C) (Oral)   Ht 5' 4" (1.626 m)   Wt 198 lb (89.8 kg)   LMP 12/03/2012   SpO2 97%   BMI 33.99 kg/m       Objective:   Physical Exam Vitals and nursing note reviewed.   Constitutional:      Appearance: Normal appearance.  HENT:     Nose: Nose normal. No congestion or rhinorrhea.     Mouth/Throat:     Mouth:   Mucous membranes are moist.     Pharynx: Oropharynx is clear. No oropharyngeal exudate.  Cardiovascular:     Rate and Rhythm: Normal rate and regular rhythm.     Pulses: Normal pulses.     Heart sounds: Normal heart sounds.  Pulmonary:     Effort: Pulmonary effort is normal.     Breath sounds: Normal breath sounds.  Musculoskeletal:        General: Normal range of motion.  Skin:    General: Skin is warm and dry.     Capillary Refill: Capillary refill takes less than 2 seconds.  Neurological:     General: No focal deficit present.     Mental Status: She is alert and oriented to person, place, and time.       Assessment & Plan:  1. Chronic cough - Will trial her on Astelin  - OSA also possible cause - advised to find out what is going on her cpap machine  - azelastine (ASTELIN) 0.1 % nasal spray; Place 2 sprays into both nostrils 2 (two) times daily. Use in each nostril as directed  Dispense: 30 mL; Refill: 6  2. Essential hypertension - She did not take her BP medication today  - Will switch her lisinopril to Norvasc incase this is the cause of her cough.  - Follow up in 30 days  - amLODipine (NORVASC) 10 MG tablet; Take 1 tablet (10 mg total) by mouth daily.  Dispense: 90 tablet; Refill: 0  Cory Nafziger, NP  Time spent with patient today was 40 minutes which consisted of chart review, discussing chronic cough and htn,  work up, treatment answering questions and documentation.    

## 2022-04-11 NOTE — Patient Instructions (Addendum)
[facility]  Address: [address], [place], [place] [address] Phone: [phone]  I am going to switch your lisinopril to Norvasc   Follow up in 30 days

## 2022-04-11 NOTE — Progress Notes (Signed)
Subjective:    Patient ID: April Gonzalez, female    DOB: 21-Feb-1961, 61 y.o.   MRN: [id number]  HPI  61 year old female who  has a past medical history of Allergy, Anemia (05/2012), Fibroids, GERD (gastroesophageal reflux disease), H/O uterine prolapse, History of blood transfusion, Hyperlipidemia, Hypertension, Rectal bleeding, Seizures ([place]), and Thyroid disorder.  She presents to the office today for chronic cough. She has had a semi productive intermittent cough x 6 months or so. In the past we have tried using PPI as it was thought that silent acid reflux may be the cause.   Her last chest xray was in 10/17/2021 which showed no active cardiopulmonary disease.   She had a sleep study done in 10/2022 which showed moderate sleep apnea. She has not been able to get her CPAP yet " [facility] is fighting with lung doctors"   She feels as though her cough is getting worse. It is worse when she lays down but does have the cough when she is sitting up during the day. Sputum is white in color . She does have some PND. Is taking claritin   She denies any fevers, chills, shortness of breath.   Review of Systems See HPI   Past Medical History:  Diagnosis Date   Allergy    Anemia 05/2012   transfusion   Fibroids    H/O myomectomy   GERD (gastroesophageal reflux disease)    rare    H/O uterine prolapse    History of blood transfusion    x 3    Hyperlipidemia    Hypertension    Rectal bleeding    NOTED AT pv 01-09-17- PT STATES with most BM's for the last 2 years, also hurts    Seizures ([place])    last seizure age 33 or 10 per pt report-01-09-17 no change in this   Thyroid disorder     Social History   Socioeconomic History   Marital status: Married    Spouse name: Not on file   Number of children: 2   Years of education: 15   Highest education level: Not on file  Occupational History   Not on file  Tobacco Use   Smoking status: Never   Smokeless tobacco: Never   Substance and Sexual Activity   Alcohol use: Yes    Alcohol/week: 0.0 standard drinks of alcohol    Comment: rare   Drug use: No   Sexual activity: Yes    Birth control/protection: Surgical    Comment: BTL   Other Topics Concern   Not on file  Social History Narrative   She works for [employer]    Married   Has 2 children    Social Determinants of Health   Financial Resource Strain: Not on file  Food Insecurity: Not on file  Transportation Needs: Not on file  Physical Activity: Not on file  Stress: Not on file  Social Connections: Not on file  Intimate Partner Violence: Not on file    Past Surgical History:  Procedure Laterality Date   ANKLE SURGERY     One plate, seven screws in the right ankle    BLADDER SUSPENSION N/A 12/10/2012   Procedure: TRANSVAGINAL TAPE (TVT) PROCEDURE;  Surgeon: Delice Lesch, MD;  Location: [facility];  Service: Gynecology;  Laterality: N/A;   CYSTOCELE REPAIR N/A 12/10/2012   Procedure: ANTERIOR REPAIR (CYSTOCELE);  Surgeon: Eldred Manges, MD;  Location: [facility];  Service: Gynecology;  Laterality:  N/A;   CYSTOSCOPY N/A 12/10/2012   Procedure: CYSTOSCOPY;  Surgeon: Delice Lesch, MD;  Location: [facility];  Service: Gynecology;  Laterality: N/A;   EXCISION VAGINAL CYST     MYOMECTOMY  11YRS AGO   HYSTEROSCOPIC   TUBAL LIGATION  10/10/1991   VAGINAL HYSTERECTOMY N/A 12/10/2012   Procedure: Total Vaginal Hysterectomy;  Surgeon: Eldred Manges, MD;  Location: [facility];  Service: Gynecology;  Laterality: N/A;    Family History  Problem Relation Age of Onset   Heart failure Father    Diabetes Mother    High blood pressure Mother    Arthritis Mother    Breast cancer Maternal Aunt        in her 69's    Breast cancer Cousin    Thyroid disease Sister    Colon cancer Neg Hx    Colon polyps Neg Hx    Esophageal cancer Neg Hx    Rectal cancer Neg Hx    Stomach cancer Neg Hx     Allergies  Allergen Reactions   Methimazole Anaphylaxis    EMS  had to be called and a 7-day's stay in the hospital resulted   Nitroglycerin Other (See Comments)    Sweating    Other Itching and Other (See Comments)    Seasonal allergies & dog hair = runny nose also   Tetracycline Hcl Hypertension   Promethazine Nausea Only    Made the patient feel like she was going to vomit and it "spaced" her out   Shrimp [Shellfish Allergy] Itching   Latex Rash   Penicillins Other (See Comments)    Has patient had a PCN reaction causing immediate rash, facial/tongue/throat swelling, SOB or lightheadedness with hypotension: Unk Has patient had a PCN reaction causing severe rash involving mucus membranes or skin necrosis: Unk Has patient had a PCN reaction that required hospitalization: Unk Has patient had a PCN reaction occurring within the last 10 years: Yes If all of the above answers are "NO", then may proceed with Cephalosporin use.    Tape Rash   Terbutaline Other (See Comments)    Reaction not recalled (??)    Current Outpatient Medications on File Prior to Visit  Medication Sig Dispense Refill   acetaminophen (TYLENOL) 500 MG tablet Take 500-1,000 mg by mouth every 6 (six) hours as needed for mild pain or headache.     clobetasol cream (TEMOVATE) 6.37 % Apply 1 Application topically 2 (two) times daily. 30 g 1   EPINEPHrine (EPIPEN 2-PAK) 0.3 mg/0.3 mL IJ SOAJ injection Inject 0.3 mLs (0.3 mg total) into the muscle once for 1 dose. If you develop difficulty breathing 0.3 mL 0   levofloxacin (LEVAQUIN) 500 MG tablet Take 1 tablet (500 mg total) by mouth daily. 7 tablet 0   silver sulfADIAZINE (SILVADENE) 1 % cream Apply 1 Application topically daily. 50 g 0   No current facility-administered medications on file prior to visit.    BP (!) 140/100   Pulse 82   Temp 98.1 F (36.7 C) (Oral)   Ht '5\' 4"'$  (1.626 m)   Wt 198 lb (89.8 kg)   LMP 12/03/2012   SpO2 97%   BMI 33.99 kg/m       Objective:   Physical Exam Vitals and nursing note reviewed.   Constitutional:      Appearance: Normal appearance.  HENT:     Nose: Nose normal. No congestion or rhinorrhea.     Mouth/Throat:     Mouth:  Mucous membranes are moist.     Pharynx: Oropharynx is clear. No oropharyngeal exudate.  Cardiovascular:     Rate and Rhythm: Normal rate and regular rhythm.     Pulses: Normal pulses.     Heart sounds: Normal heart sounds.  Pulmonary:     Effort: Pulmonary effort is normal.     Breath sounds: Normal breath sounds.  Musculoskeletal:        General: Normal range of motion.  Skin:    General: Skin is warm and dry.     Capillary Refill: Capillary refill takes less than 2 seconds.  Neurological:     General: No focal deficit present.     Mental Status: She is alert and oriented to person, place, and time.       Assessment & Plan:  1. Chronic cough - Will trial her on Astelin  - OSA also possible cause - advised to find out what is going on her cpap machine  - azelastine (ASTELIN) 0.1 % nasal spray; Place 2 sprays into both nostrils 2 (two) times daily. Use in each nostril as directed  Dispense: 30 mL; Refill: 6  2. Essential hypertension - She did not take her BP medication today  - Will switch her lisinopril to Norvasc incase this is the cause of her cough.  - Follow up in 30 days  - amLODipine (NORVASC) 10 MG tablet; Take 1 tablet (10 mg total) by mouth daily.  Dispense: 90 tablet; Refill: 0  [name], NP  Time spent with patient today was 40 minutes which consisted of chart review, discussing chronic cough and htn,  work up, treatment answering questions and documentation.

## 2022-04-18 ENCOUNTER — Telehealth: Payer: Self-pay

## 2022-04-18 NOTE — Telephone Encounter (Signed)
Triage nurse on the phone and she stated April Gonzalez was placed on new BP meds and forgot her dose last night so pt was wondering if she should take the dose this morning?

## 2022-04-22 NOTE — Telephone Encounter (Signed)
k

## 2022-04-23 ENCOUNTER — Telehealth: Payer: Self-pay | Admitting: Adult Health

## 2022-04-23 DIAGNOSIS — G4733 Obstructive sleep apnea (adult) (pediatric): Secondary | ICD-10-CM

## 2022-04-23 NOTE — Telephone Encounter (Signed)
PT states she wants a CPAP machine. Supplier does not have this type ([facility]). Please call to advise. [phone]. Prefers Voicemail message caus she will be at work.

## 2022-04-24 NOTE — Telephone Encounter (Signed)
Called April Gonzalez first to determine if they had the original CPAP order from August. And Tonya states yes and tried 5 different times to deliver. But patient felt like they are having issues with the patient and the copay.   Called patient to ask her if she was wanting to move forward with the cpap now. She states that she is wanting to know about the hoseless brand. And the only one I can think of is inspire. She states that she feels like she was getting pulled around given she had to pay an amount for the machine. I did explain that was her co pay from her insurance.  Tammy what are your recommendations for hoseless device? She would have to try cpap first correct?  Please advise tammy

## 2022-04-24 NOTE — Telephone Encounter (Signed)
LVM asking patient to give [place] a call back in regards to  a cpap machine

## 2022-04-24 NOTE — Telephone Encounter (Signed)
Lm for pt regarding message. However, I see that pt has been scheduled. We will wait until appt. Tomorrow.

## 2022-04-25 ENCOUNTER — Ambulatory Visit (INDEPENDENT_AMBULATORY_CARE_PROVIDER_SITE_OTHER): Admitting: Adult Health

## 2022-04-25 ENCOUNTER — Encounter: Payer: Self-pay | Admitting: Adult Health

## 2022-04-25 VITALS — BP 120/82 | HR 95 | Temp 97.6°F | Ht 64.0 in | Wt 198.0 lb

## 2022-04-25 DIAGNOSIS — Z23 Encounter for immunization: Secondary | ICD-10-CM

## 2022-04-25 DIAGNOSIS — I10 Essential (primary) hypertension: Secondary | ICD-10-CM

## 2022-04-25 NOTE — Telephone Encounter (Signed)
Called and spoke with April Gonzalez at [facility]. She stated that they did receive the order but sent the original order had been to sent to [facility], they were not able to process it. She has requested to have a new order to [facility] to see if they can get it processed.   Order has been placed.   Called patient but she did not answer. Left message for her to call back.

## 2022-04-25 NOTE — Progress Notes (Signed)
Subjective:    Patient ID: April Gonzalez, female    DOB: 1960-06-06, 61 y.o.   MRN: [id number]  HPI  61 year old female who  has a past medical history of Allergy, Anemia (05/2012), Fibroids, GERD (gastroesophageal reflux disease), H/O uterine prolapse, History of blood transfusion, Hyperlipidemia, Hypertension, Rectal bleeding, Seizures ([place]), and Thyroid disorder.  She presents to the office today for follow up regarding HTN. During her visit 3 weeks ago we switched her blood pressure medication from lisinopril to Norvasc due to chronic cough.   Since the switch she has been doing well and has not had any side effects. She does report that her cough has resolved and is very happy about this   Review of Systems See HPI   Past Medical History:  Diagnosis Date   Allergy    Anemia 05/2012   transfusion   Fibroids    H/O myomectomy   GERD (gastroesophageal reflux disease)    rare    H/O uterine prolapse    History of blood transfusion    x 3    Hyperlipidemia    Hypertension    Rectal bleeding    NOTED AT pv 01-09-17- PT STATES with most BM's for the last 2 years, also hurts    Seizures ([place])    last seizure age 69 or 10 per pt report-01-09-17 no change in this   Thyroid disorder     Social History   Socioeconomic History   Marital status: Married    Spouse name: Not on file   Number of children: 2   Years of education: 15   Highest education level: Not on file  Occupational History   Not on file  Tobacco Use   Smoking status: Never   Smokeless tobacco: Never  Substance and Sexual Activity   Alcohol use: Yes    Alcohol/week: 0.0 standard drinks of alcohol    Comment: rare   Drug use: No   Sexual activity: Yes    Birth control/protection: Surgical    Comment: BTL   Other Topics Concern   Not on file  Social History Narrative   She works for [employer]    Married   Has 2 children    Social Determinants of Health   Financial Resource Strain: Not on file  Food  Insecurity: Not on file  Transportation Needs: Not on file  Physical Activity: Not on file  Stress: Not on file  Social Connections: Not on file  Intimate Partner Violence: Not on file    Past Surgical History:  Procedure Laterality Date   ANKLE SURGERY     One plate, seven screws in the right ankle    BLADDER SUSPENSION N/A 12/10/2012   Procedure: TRANSVAGINAL TAPE (TVT) PROCEDURE;  Surgeon: Delice Lesch, MD;  Location: [facility];  Service: Gynecology;  Laterality: N/A;   CYSTOCELE REPAIR N/A 12/10/2012   Procedure: ANTERIOR REPAIR (CYSTOCELE);  Surgeon: Eldred Manges, MD;  Location: [facility];  Service: Gynecology;  Laterality: N/A;   CYSTOSCOPY N/A 12/10/2012   Procedure: CYSTOSCOPY;  Surgeon: Delice Lesch, MD;  Location: [facility];  Service: Gynecology;  Laterality: N/A;   EXCISION VAGINAL CYST     MYOMECTOMY  93YRS AGO   HYSTEROSCOPIC   TUBAL LIGATION  10/10/1991   VAGINAL HYSTERECTOMY N/A 12/10/2012   Procedure: Total Vaginal Hysterectomy;  Surgeon: Eldred Manges, MD;  Location: [facility];  Service: Gynecology;  Laterality: N/A;    Family History  Problem Relation  Age of Onset   Heart failure Father    Diabetes Mother    High blood pressure Mother    Arthritis Mother    Breast cancer Maternal Aunt        in her 2's    Breast cancer Cousin    Thyroid disease Sister    Colon cancer Neg Hx    Colon polyps Neg Hx    Esophageal cancer Neg Hx    Rectal cancer Neg Hx    Stomach cancer Neg Hx     Allergies  Allergen Reactions   Methimazole Anaphylaxis    EMS had to be called and a 7-day's stay in the hospital resulted   Nitroglycerin Other (See Comments)    Sweating    Other Itching and Other (See Comments)    Seasonal allergies & dog hair = runny nose also   Tetracycline Hcl Hypertension   Promethazine Nausea Only    Made the patient feel like she was going to vomit and it "spaced" her out   Shrimp [Shellfish Allergy] Itching   Latex Rash   Penicillins Other (See  Comments)    Has patient had a PCN reaction causing immediate rash, facial/tongue/throat swelling, SOB or lightheadedness with hypotension: Unk Has patient had a PCN reaction causing severe rash involving mucus membranes or skin necrosis: Unk Has patient had a PCN reaction that required hospitalization: Unk Has patient had a PCN reaction occurring within the last 10 years: Yes If all of the above answers are "NO", then may proceed with Cephalosporin use.    Tape Rash   Terbutaline Other (See Comments)    Reaction not recalled (??)    Current Outpatient Medications on File Prior to Visit  Medication Sig Dispense Refill   acetaminophen (TYLENOL) 500 MG tablet Take 500-1,000 mg by mouth every 6 (six) hours as needed for mild pain or headache.     amLODipine (NORVASC) 10 MG tablet Take 1 tablet (10 mg total) by mouth daily. 90 tablet 0   azelastine (ASTELIN) 0.1 % nasal spray Place 2 sprays into both nostrils 2 (two) times daily. Use in each nostril as directed 30 mL 6   clobetasol cream (TEMOVATE) 4.28 % Apply 1 Application topically 2 (two) times daily. 30 g 1   levofloxacin (LEVAQUIN) 500 MG tablet Take 1 tablet (500 mg total) by mouth daily. 7 tablet 0   silver sulfADIAZINE (SILVADENE) 1 % cream Apply 1 Application topically daily. 50 g 0   EPINEPHrine (EPIPEN 2-PAK) 0.3 mg/0.3 mL IJ SOAJ injection Inject 0.3 mLs (0.3 mg total) into the muscle once for 1 dose. If you develop difficulty breathing 0.3 mL 0   No current facility-administered medications on file prior to visit.    Temp 97.6 F (36.4 C) (Oral)   Ht '5\' 4"'$  (1.626 m)   Wt 198 lb (89.8 kg)   LMP 12/03/2012   BMI 33.99 kg/m       Objective:   Physical Exam Vitals and nursing note reviewed.  Constitutional:      Appearance: Normal appearance.  Cardiovascular:     Rate and Rhythm: Normal rate and regular rhythm.     Pulses: Normal pulses.     Heart sounds: Normal heart sounds.  Pulmonary:     Effort: Pulmonary effort  is normal.     Breath sounds: Normal breath sounds.  Musculoskeletal:        General: Normal range of motion.  Skin:    General: Skin is warm and  dry.     Capillary Refill: Capillary refill takes less than 2 seconds.  Neurological:     General: No focal deficit present.     Mental Status: She is alert and oriented to person, place, and time.  Psychiatric:        Mood and Affect: Mood normal.        Behavior: Behavior normal.        Thought Content: Thought content normal.        Judgment: Judgment normal.       Assessment & Plan:   1. Essential hypertension - Controlled.  - Cough resolved  - Follow up as needed

## 2022-04-25 NOTE — Addendum Note (Signed)
Addended by: Gwenyth Ober R on: 04/25/2022 11:55 AM   Modules accepted: Orders

## 2022-04-25 NOTE — Progress Notes (Signed)
 Subjective:    Patient ID: Jody [name], female    DOB: 07/13/1960, 61 y.o.   MRN: [id number]  HPI  61 year old female who  has a past medical history of Allergy, Anemia (05/2012), Fibroids, GERD (gastroesophageal reflux disease), H/O uterine prolapse, History of blood transfusion, Hyperlipidemia, Hypertension, Rectal bleeding, Seizures ([facility]), and Thyroid disorder.  She presents to the office today for follow up regarding HTN. During her visit 3 weeks ago we switched her blood pressure medication from lisinopril to Norvasc due to chronic cough.   Since the switch she has been doing well and has not had any side effects. She does report that her cough has resolved and is very happy about this   Review of Systems See HPI   Past Medical History:  Diagnosis Date   Allergy    Anemia 05/2012   transfusion   Fibroids    H/O myomectomy   GERD (gastroesophageal reflux disease)    rare    H/O uterine prolapse    History of blood transfusion    x 3    Hyperlipidemia    Hypertension    Rectal bleeding    NOTED AT pv 01-09-17- PT STATES with most BM's for the last 2 years, also hurts    Seizures ([facility])    last seizure age 9 or 10 per pt report-01-09-17 no change in this   Thyroid disorder     Social History   Socioeconomic History   Marital status: Married    Spouse name: Not on file   Number of children: 2   Years of education: 15   Highest education level: Not on file  Occupational History   Not on file  Tobacco Use   Smoking status: Never   Smokeless tobacco: Never  Substance and Sexual Activity   Alcohol use: Yes    Alcohol/week: 0.0 standard drinks of alcohol    Comment: rare   Drug use: No   Sexual activity: Yes    Birth control/protection: Surgical    Comment: BTL   Other Topics Concern   Not on file  Social History Narrative   She works for [employer]    Married   Has 2 children    Social Determinants of Health   Financial Resource Strain: Not on file  Food  Insecurity: Not on file  Transportation Needs: Not on file  Physical Activity: Not on file  Stress: Not on file  Social Connections: Not on file  Intimate Partner Violence: Not on file    Past Surgical History:  Procedure Laterality Date   ANKLE SURGERY     One plate, seven screws in the right ankle    BLADDER SUSPENSION N/A 12/10/2012   Procedure: TRANSVAGINAL TAPE (TVT) PROCEDURE;  Surgeon: Angela Y Roberts, MD;  Location: [facility];  Service: Gynecology;  Laterality: N/A;   CYSTOCELE REPAIR N/A 12/10/2012   Procedure: ANTERIOR REPAIR (CYSTOCELE);  Surgeon: Vanessa P Haygood, MD;  Location: [facility];  Service: Gynecology;  Laterality: N/A;   CYSTOSCOPY N/A 12/10/2012   Procedure: CYSTOSCOPY;  Surgeon: Angela Y Roberts, MD;  Location: [facility];  Service: Gynecology;  Laterality: N/A;   EXCISION VAGINAL CYST     MYOMECTOMY  10YRS AGO   HYSTEROSCOPIC   TUBAL LIGATION  10/10/1991   VAGINAL HYSTERECTOMY N/A 12/10/2012   Procedure: Total Vaginal Hysterectomy;  Surgeon: Vanessa P Haygood, MD;  Location: [facility];  Service: Gynecology;  Laterality: N/A;    Family History  Problem Relation   Age of Onset   Heart failure Father    Diabetes Mother    High blood pressure Mother    Arthritis Mother    Breast cancer Maternal Aunt        in her 50's    Breast cancer Cousin    Thyroid disease Sister    Colon cancer Neg Hx    Colon polyps Neg Hx    Esophageal cancer Neg Hx    Rectal cancer Neg Hx    Stomach cancer Neg Hx     Allergies  Allergen Reactions   Methimazole Anaphylaxis    EMS had to be called and a 7-day's stay in the hospital resulted   Nitroglycerin Other (See Comments)    Sweating    Other Itching and Other (See Comments)    Seasonal allergies & dog hair = runny nose also   Tetracycline Hcl Hypertension   Promethazine Nausea Only    Made the patient feel like she was going to vomit and it "spaced" her out   Shrimp [Shellfish Allergy] Itching   Latex Rash   Penicillins Other (See  Comments)    Has patient had a PCN reaction causing immediate rash, facial/tongue/throat swelling, SOB or lightheadedness with hypotension: Unk Has patient had a PCN reaction causing severe rash involving mucus membranes or skin necrosis: Unk Has patient had a PCN reaction that required hospitalization: Unk Has patient had a PCN reaction occurring within the last 10 years: Yes If all of the above answers are "NO", then may proceed with Cephalosporin use.    Tape Rash   Terbutaline Other (See Comments)    Reaction not recalled (??)    Current Outpatient Medications on File Prior to Visit  Medication Sig Dispense Refill   acetaminophen (TYLENOL) 500 MG tablet Take 500-1,000 mg by mouth every 6 (six) hours as needed for mild pain or headache.     amLODipine (NORVASC) 10 MG tablet Take 1 tablet (10 mg total) by mouth daily. 90 tablet 0   azelastine (ASTELIN) 0.1 % nasal spray Place 2 sprays into both nostrils 2 (two) times daily. Use in each nostril as directed 30 mL 6   clobetasol cream (TEMOVATE) 0.05 % Apply 1 Application topically 2 (two) times daily. 30 g 1   levofloxacin (LEVAQUIN) 500 MG tablet Take 1 tablet (500 mg total) by mouth daily. 7 tablet 0   silver sulfADIAZINE (SILVADENE) 1 % cream Apply 1 Application topically daily. 50 g 0   EPINEPHrine (EPIPEN 2-PAK) 0.3 mg/0.3 mL IJ SOAJ injection Inject 0.3 mLs (0.3 mg total) into the muscle once for 1 dose. If you develop difficulty breathing 0.3 mL 0   No current facility-administered medications on file prior to visit.    Temp 97.6 F (36.4 C) (Oral)   Ht 5' 4" (1.626 m)   Wt 198 lb (89.8 kg)   LMP 12/03/2012   BMI 33.99 kg/m       Objective:   Physical Exam Vitals and nursing note reviewed.  Constitutional:      Appearance: Normal appearance.  Cardiovascular:     Rate and Rhythm: Normal rate and regular rhythm.     Pulses: Normal pulses.     Heart sounds: Normal heart sounds.  Pulmonary:     Effort: Pulmonary effort  is normal.     Breath sounds: Normal breath sounds.  Musculoskeletal:        General: Normal range of motion.  Skin:    General: Skin is warm and   dry.     Capillary Refill: Capillary refill takes less than 2 seconds.  Neurological:     General: No focal deficit present.     Mental Status: She is alert and oriented to person, place, and time.  Psychiatric:        Mood and Affect: Mood normal.        Behavior: Behavior normal.        Thought Content: Thought content normal.        Judgment: Judgment normal.       Assessment & Plan:   1. Essential hypertension - Controlled.  - Cough resolved  - Follow up as needed   

## 2022-04-25 NOTE — Telephone Encounter (Signed)
Home sleep study done on October 24, 2021 showed moderate obstructive sleep apnea with AHI 22.5/hour and SPO2 low at 84%.   Sorry there is no hoseless CPAP .  Oral appliance would be an option (if she has no removeable dental work) .  Can refer to orthodontics.   INSPIRE device would have to try CPAP first and fail before she can get eval for INSPIRE .   Can we call [employer] to see why her insurance is not covering

## 2022-06-19 ENCOUNTER — Ambulatory Visit (INDEPENDENT_AMBULATORY_CARE_PROVIDER_SITE_OTHER): Admitting: Student in an Organized Health Care Education/Training Program

## 2022-06-19 ENCOUNTER — Encounter (INDEPENDENT_AMBULATORY_CARE_PROVIDER_SITE_OTHER): Payer: Self-pay

## 2022-06-19 VITALS — BP 158/94 | HR 83 | Temp 97.8°F | Resp 19 | Ht 63.0 in | Wt 191.0 lb

## 2022-06-19 DIAGNOSIS — H66004 Acute suppurative otitis media without spontaneous rupture of ear drum, recurrent, right ear: Secondary | ICD-10-CM

## 2022-06-19 DIAGNOSIS — H60501 Unspecified acute noninfective otitis externa, right ear: Secondary | ICD-10-CM

## 2022-06-19 MED ORDER — CEFDINIR 300 MG PO CAPS
300.0000 mg | ORAL_CAPSULE | Freq: Two times a day (BID) | ORAL | 0 refills | Status: AC
Start: 2022-06-19 — End: 2022-06-24

## 2022-06-19 MED ORDER — NEOMYCIN-POLYMYXIN-HC 3.5-10000-1 OT SOLN
4.0000 [drp] | Freq: Three times a day (TID) | OTIC | 0 refills | Status: AC
Start: 2022-06-19 — End: 2022-06-26

## 2022-06-19 NOTE — Patient Instructions (Addendum)
Please take any/all prescribed medications as directed.    Consider over the counter Flonase, antihistamine, cool mist humidifier, head elevation and steam showers.    For pain/fever, Tylenol (not an anti-inflammatory) and/or NSAIDs (anti-inflammatories: Motrin, Aleve, Advil, Ibuprofen.. with food)    Drink plenty of Fluids    Monitor symptoms for changes/worsening and follow up with primary care provider.

## 2022-06-19 NOTE — Progress Notes (Signed)
Urgent Care Provider Note    Patient: Jody [name]   Date: 06/19/2022   MRN: [id number]       Subjective     Chief Complaint   Patient presents with    Ear Fullness     Pt c/o ear drainage started 2 hours ago       HPI:  HPI    Jody [name] is a 62 y.o. female with PMH of chronically recurring OM with sinus involvement c/o upper respiratory symptoms that are mild which started today and has been gradually worsening. Patient admits to ear ache. Patient denies fever, myalgia, chills, vomiting, diarrhea, SOB, chest pain, and rash.    Pertinent Past Medical, Surgical, Family and Social History were reviewed.      Current Outpatient Medications:     acetaminophen (TYLENOL) 500 MG tablet, Take 1-2 tablets (500-1,000 mg) by mouth, Disp: , Rfl:     amLODIPine (NORVASC) 10 MG tablet, Take 1 tablet (10 mg) by mouth daily, Disp: , Rfl:     azelastine (ASTELIN) 0.1 % nasal spray, 2 sprays by Nasal route, Disp: , Rfl:     clobetasol (TEMOVATE) 0.05 % cream, Apply 1 .application topically 2 (two) times daily, Disp: , Rfl:     levoFLOXacin (LEVAQUIN) 500 MG tablet, Take 1 tablet (500 mg) by mouth daily, Disp: , Rfl:     silver sulfADIAZINE (SILVADENE) 1 % cream, Apply 1 .application topically daily, Disp: , Rfl:     cefdinir (OMNICEF) 300 MG capsule, Take 1 capsule (300 mg) by mouth 2 (two) times daily for 5 days, Disp: 10 capsule, Rfl: 0    neomycin-polymyxin-hydrocortisone (CORTISPORIN) otic solution, Place 4 drops into the left ear 3 (three) times daily for 7 days, Disp: 4.2 mL, Rfl: 0    Allergies   Allergen Reactions    Methimazole Anaphylaxis     EMS had to be called and a 7-day's stay in the hospital resulted    Nitroglycerin Other (See Comments)     Sweating    Tetracycline Hypertension and Other (See Comments)    Lisinopril Cough    Promethazine Nausea Only     Made the patient feel like she was going to vomit and it "spaced" her out    Shellfish Allergy Itching    Latex Rash    Penicillins Other (See Comments)     Has  patient had a PCN reaction causing immediate rash, facial/tongue/throat swelling, SOB or lightheadedness with hypotension: Unk   Has patient had a PCN reaction causing severe rash involving mucus membranes or skin necrosis: Unk   Has patient had a PCN reaction that required hospitalization: Unk   Has patient had a PCN reaction occurring within the last 10 years: Yes   If all of the above answers are "NO", then may proceed with Cephalosporin use.    Tape Rash    Terbutaline Other (See Comments)     Reaction not recalled (??)    Wound Dressing Adhesive Rash       Medications and Allergies reviewed.         Objective     Vitals:    06/19/22 1611   BP: (!) 158/94   Pulse: 83   Resp: 19   Temp: 97.8 F (36.6 C)   SpO2: 96%     Body mass index is 33.83 kg/m.    Physical Exam    General: well developed, well nourished, no acute distress.     Eyes: No conjunctival injection or  discharge.    Ear: Erythema, edema and purulent discharge in auditory canal; normal pinnal and TM; no mastoid tenderness.    Nose/sinus: no nasal congestion; no edema or erythema overlying the sinuses, no sinus tenderness.    Throat: no posterior oropharynx erythema, swelling, exudates or peritonsillar bulging; normal, midline uvula; no trismus or drooling.    Neck: No stridor. Normal ROM.    Lung: normal work of breathing, speaking complete sentences, no rales, wheezing or rhonchi.    Heart: Regular rhythm, no murmurs.    UCC COURSE  There were no labs reviewed with this patient during the visit.    There were no x-rays reviewed with this patient during the visit.    No current facility-administered medications for this visit.             PROCEDURES:  Procedures    MDM:    See HPI for details.    62 yo F visiting daughter from out of town, presents for recurrent pain of R ear. States that she is followed by ENT in her hometown and she has been told that she may require tympanoplasty in the near future.  States that she experiences this  approximately 3x per year and the pain is radiating across her R cheek and toward her chin.    Given H&P and with consideration of pt's allergies, cefdinir and Otic Polytrim prescribed.  Eemphasized follow up with ENT once she returns home within the next week.     VSS and pt is in no acute distress. Pt ambulatory and breathing adequately. Stable for discharge at this time.     Treatment plan and return precautions discussed with patient. Encouraged followed up with PCP. Pt/family voices understanding and agrees with plan. Education provided.    This note was generated by the Epic EMR system/ Dragon speech recognition and may contain inherent errors or omissions not intended by the user. Grammatical errors, random word insertions, deletions and pronoun errors  are occasional consequences of this technology due to software limitations. Not all errors are caught or corrected. If there are questions or concerns about the content of this note or information contained within the body of this dictation they should be addressed directly with the author for clarification.            Assessment         Jody [name] was seen today for ear fullness.    Diagnoses and all orders for this visit:    Recurrent acute suppurative otitis media of right ear without spontaneous rupture of tympanic membrane  -     cefdinir (OMNICEF) 300 MG capsule; Take 1 capsule (300 mg) by mouth 2 (two) times daily for 5 days    Acute otitis externa of right ear, unspecified type  -     neomycin-polymyxin-hydrocortisone (CORTISPORIN) otic solution; Place 4 drops into the left ear 3 (three) times daily for 7 days        Plan and follow-up discussed with patient. See AVS for further documentation.

## 2022-07-06 ENCOUNTER — Other Ambulatory Visit: Payer: Self-pay | Admitting: Adult Health

## 2022-07-06 DIAGNOSIS — I10 Essential (primary) hypertension: Secondary | ICD-10-CM

## 2022-07-10 ENCOUNTER — Telehealth (INDEPENDENT_AMBULATORY_CARE_PROVIDER_SITE_OTHER): Admitting: Adult Health

## 2022-07-10 ENCOUNTER — Telehealth: Admitting: Family Medicine

## 2022-07-10 ENCOUNTER — Encounter: Payer: Self-pay | Admitting: Adult Health

## 2022-07-10 VITALS — Ht 64.0 in | Wt 198.0 lb

## 2022-07-10 DIAGNOSIS — F419 Anxiety disorder, unspecified: Secondary | ICD-10-CM

## 2022-07-10 MED ORDER — BUPROPION HCL ER (XL) 150 MG PO TB24: 150.0000 mg | ORAL_TABLET | Freq: Every day | ORAL | 0 refills | Status: DC

## 2022-07-10 NOTE — Telephone Encounter (Signed)
I verified [employer] sent [id number]

## 2022-07-10 NOTE — Progress Notes (Signed)
Virtual Visit via Video Note  I connected with April Gonzalez on 07/10/22 at 10:30 AM EST by a video enabled telemedicine application and verified that I am speaking with the correct person using two identifiers.  Location patient: home Location provider:work or home office Persons participating in the virtual visit: patient, provider  I discussed the limitations of evaluation and management by telemedicine and the availability of in person appointments. The patient expressed understanding and agreed to proceed.   HPI: She presents to the office today for increased anxiety has been present over the last few weeks.  Her anxiety stems from her husband's PTSD from being a veteran.  She reports that "his personality goes from 1 extreme to the other.".  He does not feel as show she is in danger as he is not violent.  His mood swings are causing a lot of stress and anxiety for her where she is having trouble sleeping because she feels as though her brain is constantly running, her focus is not where it should be, she is constantly worried, she has loss of appetite and has not been able to work a full shift due to anxiety.  She feels depleted. She would like to go on medication    ROS: See pertinent positives and negatives per HPI.  Past Medical History:  Diagnosis Date   Allergy    Anemia 05/2012   transfusion   Fibroids    H/O myomectomy   GERD (gastroesophageal reflux disease)    rare    H/O uterine prolapse    History of blood transfusion    x 3    Hyperlipidemia    Hypertension    Rectal bleeding    NOTED AT pv 01-09-17- PT STATES with most BM's for the last 2 years, also hurts    Seizures ([place])    last seizure age 45 or 10 per pt report-01-09-17 no change in this   Thyroid disorder     Past Surgical History:  Procedure Laterality Date   ANKLE SURGERY     One plate, seven screws in the right ankle    BLADDER SUSPENSION N/A 12/10/2012   Procedure: TRANSVAGINAL TAPE (TVT) PROCEDURE;   Surgeon: Delice Lesch, MD;  Location: [facility];  Service: Gynecology;  Laterality: N/A;   CYSTOCELE REPAIR N/A 12/10/2012   Procedure: ANTERIOR REPAIR (CYSTOCELE);  Surgeon: Eldred Manges, MD;  Location: [facility];  Service: Gynecology;  Laterality: N/A;   CYSTOSCOPY N/A 12/10/2012   Procedure: CYSTOSCOPY;  Surgeon: Delice Lesch, MD;  Location: [facility];  Service: Gynecology;  Laterality: N/A;   EXCISION VAGINAL CYST     MYOMECTOMY  49YRS AGO   HYSTEROSCOPIC   TUBAL LIGATION  10/10/1991   VAGINAL HYSTERECTOMY N/A 12/10/2012   Procedure: Total Vaginal Hysterectomy;  Surgeon: Eldred Manges, MD;  Location: [facility];  Service: Gynecology;  Laterality: N/A;    Family History  Problem Relation Age of Onset   Heart failure Father    Diabetes Mother    High blood pressure Mother    Arthritis Mother    Breast cancer Maternal Aunt        in her 40's    Breast cancer Cousin    Thyroid disease Sister    Colon cancer Neg Hx    Colon polyps Neg Hx    Esophageal cancer Neg Hx    Rectal cancer Neg Hx    Stomach cancer Neg Hx        Current Outpatient  Medications:    acetaminophen (TYLENOL) 500 MG tablet, Take 500-1,000 mg by mouth every 6 (six) hours as needed for mild pain or headache., Disp: , Rfl:    amLODipine (NORVASC) 10 MG tablet, TAKE 1 TABLET BY MOUTH DAILY, Disp: 90 tablet, Rfl: 0   azelastine (ASTELIN) 0.1 % nasal spray, Place 2 sprays into both nostrils 2 (two) times daily. Use in each nostril as directed, Disp: 30 mL, Rfl: 6   clobetasol cream (TEMOVATE) [id number] %, Apply 1 Application topically 2 (two) times daily., Disp: 30 g, Rfl: 1   EPINEPHrine (EPIPEN 2-PAK) 0.3 mg/0.3 mL IJ SOAJ injection, Inject 0.3 mLs (0.3 mg total) into the muscle once for 1 dose. If you develop difficulty breathing, Disp: 0.3 mL, Rfl: 0   silver sulfADIAZINE (SILVADENE) 1 % cream, Apply 1 Application topically daily., Disp: 50 g, Rfl: 0  EXAM:  VITALS per patient if applicable:  GENERAL: alert,  oriented, appears well and in no acute distress  HEENT: atraumatic, conjunttiva clear, no obvious abnormalities on inspection of external nose and ears  NECK: normal movements of the head and neck  LUNGS: on inspection no signs of respiratory distress, breathing rate appears normal, no obvious gross SOB, gasping or wheezing  CV: no obvious cyanosis  MS: moves all visible extremities without noticeable abnormality  PSYCH/NEURO: pleasant and cooperative, no obvious depression or anxiety, speech and thought processing grossly intact  ASSESSMENT AND PLAN:  Discussed the following assessment and plan:  1. Anxiety -We reviewed different medications and side effects that I think would be helpful for her ultimately decided on trying to use Wellbutrin 150 mg extended release.  Advised of side effects of this medication.  She will follow-up in 30 days or sooner if needed - buPROPion (WELLBUTRIN XL) 150 MG 24 hr tablet; Take 1 tablet (150 mg total) by mouth daily.  Dispense: 90 tablet; Refill: 0      I discussed the assessment and treatment plan with the patient. The patient was provided an opportunity to ask questions and all were answered. The patient agreed with the plan and demonstrated an understanding of the instructions.   The patient was advised to call back or seek an in-person evaluation if the symptoms worsen or if the condition fails to improve as anticipated.   [name], NP

## 2022-07-10 NOTE — Progress Notes (Signed)
Virtual Visit via Video Note  I connected with Jody [name] on 07/10/22 at 10:30 AM EST by a video enabled telemedicine application and verified that I am speaking with the correct person using two identifiers.  Location patient: home Location provider:work or home office Persons participating in the virtual visit: patient, provider  I discussed the limitations of evaluation and management by telemedicine and the availability of in person appointments. The patient expressed understanding and agreed to proceed.   HPI: She presents to the office today for increased anxiety has been present over the last few weeks.  Her anxiety stems from her husband's PTSD from being a veteran.  She reports that "his personality goes from 1 extreme to the other.".  He does not feel as show she is in danger as he is not violent.  His mood swings are causing a lot of stress and anxiety for her where she is having trouble sleeping because she feels as though her brain is constantly running, her focus is not where it should be, she is constantly worried, she has loss of appetite and has not been able to work a full shift due to anxiety.  She feels depleted. She would like to go on medication    ROS: See pertinent positives and negatives per HPI.  Past Medical History:  Diagnosis Date   Allergy    Anemia 05/2012   transfusion   Fibroids    H/O myomectomy   GERD (gastroesophageal reflux disease)    rare    H/O uterine prolapse    History of blood transfusion    x 3    Hyperlipidemia    Hypertension    Rectal bleeding    NOTED AT pv 01-09-17- PT STATES with most BM's for the last 2 years, also hurts    Seizures ([facility])    last seizure age 9 or 10 per pt report-01-09-17 no change in this   Thyroid disorder     Past Surgical History:  Procedure Laterality Date   ANKLE SURGERY     One plate, seven screws in the right ankle    BLADDER SUSPENSION N/A 12/10/2012   Procedure: TRANSVAGINAL TAPE (TVT) PROCEDURE;   Surgeon: Angela Y Roberts, MD;  Location: [facility];  Service: Gynecology;  Laterality: N/A;   CYSTOCELE REPAIR N/A 12/10/2012   Procedure: ANTERIOR REPAIR (CYSTOCELE);  Surgeon: Vanessa P Haygood, MD;  Location: [facility];  Service: Gynecology;  Laterality: N/A;   CYSTOSCOPY N/A 12/10/2012   Procedure: CYSTOSCOPY;  Surgeon: Angela Y Roberts, MD;  Location: [facility];  Service: Gynecology;  Laterality: N/A;   EXCISION VAGINAL CYST     MYOMECTOMY  10YRS AGO   HYSTEROSCOPIC   TUBAL LIGATION  10/10/1991   VAGINAL HYSTERECTOMY N/A 12/10/2012   Procedure: Total Vaginal Hysterectomy;  Surgeon: Vanessa P Haygood, MD;  Location: [facility];  Service: Gynecology;  Laterality: N/A;    Family History  Problem Relation Age of Onset   Heart failure Father    Diabetes Mother    High blood pressure Mother    Arthritis Mother    Breast cancer Maternal Aunt        in her 50's    Breast cancer Cousin    Thyroid disease Sister    Colon cancer Neg Hx    Colon polyps Neg Hx    Esophageal cancer Neg Hx    Rectal cancer Neg Hx    Stomach cancer Neg Hx        Current Outpatient   Medications:    acetaminophen (TYLENOL) 500 MG tablet, Take 500-1,000 mg by mouth every 6 (six) hours as needed for mild pain or headache., Disp: , Rfl:    amLODipine (NORVASC) 10 MG tablet, TAKE 1 TABLET BY MOUTH DAILY, Disp: 90 tablet, Rfl: 0   azelastine (ASTELIN) 0.1 % nasal spray, Place 2 sprays into both nostrils 2 (two) times daily. Use in each nostril as directed, Disp: 30 mL, Rfl: 6   clobetasol cream (TEMOVATE) 0.05 %, Apply 1 Application topically 2 (two) times daily., Disp: 30 g, Rfl: 1   EPINEPHrine (EPIPEN 2-PAK) 0.3 mg/0.3 mL IJ SOAJ injection, Inject 0.3 mLs (0.3 mg total) into the muscle once for 1 dose. If you develop difficulty breathing, Disp: 0.3 mL, Rfl: 0   silver sulfADIAZINE (SILVADENE) 1 % cream, Apply 1 Application topically daily., Disp: 50 g, Rfl: 0  EXAM:  VITALS per patient if applicable:  GENERAL: alert,  oriented, appears well and in no acute distress  HEENT: atraumatic, conjunttiva clear, no obvious abnormalities on inspection of external nose and ears  NECK: normal movements of the head and neck  LUNGS: on inspection no signs of respiratory distress, breathing rate appears normal, no obvious gross SOB, gasping or wheezing  CV: no obvious cyanosis  MS: moves all visible extremities without noticeable abnormality  PSYCH/NEURO: pleasant and cooperative, no obvious depression or anxiety, speech and thought processing grossly intact  ASSESSMENT AND PLAN:  Discussed the following assessment and plan:  1. Anxiety -We reviewed different medications and side effects that I think would be helpful for her ultimately decided on trying to use Wellbutrin 150 mg extended release.  Advised of side effects of this medication.  She will follow-up in 30 days or sooner if needed - buPROPion (WELLBUTRIN XL) 150 MG 24 hr tablet; Take 1 tablet (150 mg total) by mouth daily.  Dispense: 90 tablet; Refill: 0      I discussed the assessment and treatment plan with the patient. The patient was provided an opportunity to ask questions and all were answered. The patient agreed with the plan and demonstrated an understanding of the instructions.   The patient was advised to call back or seek an in-person evaluation if the symptoms worsen or if the condition fails to improve as anticipated.   Cory Nafziger, NP    

## 2022-07-25 ENCOUNTER — Ambulatory Visit (INDEPENDENT_AMBULATORY_CARE_PROVIDER_SITE_OTHER): Admitting: Adult Health

## 2022-07-25 ENCOUNTER — Encounter: Payer: Self-pay | Admitting: Adult Health

## 2022-07-25 VITALS — BP 148/82 | HR 75 | Temp 97.5°F | Ht 64.0 in | Wt 194.0 lb

## 2022-07-25 DIAGNOSIS — K625 Hemorrhage of anus and rectum: Secondary | ICD-10-CM

## 2022-07-25 DIAGNOSIS — K2100 Gastro-esophageal reflux disease with esophagitis, without bleeding: Secondary | ICD-10-CM

## 2022-07-25 DIAGNOSIS — B359 Dermatophytosis, unspecified: Secondary | ICD-10-CM | POA: Diagnosis not present

## 2022-07-25 MED ORDER — PANTOPRAZOLE SODIUM 40 MG PO TBEC: 40.0000 mg | DELAYED_RELEASE_TABLET | Freq: Every day | ORAL | 3 refills | Status: DC

## 2022-07-25 MED ORDER — SUCRALFATE 1 GM/10ML PO SUSP: 1.0000 g | Freq: Three times a day (TID) | ORAL | 0 refills | Status: DC

## 2022-07-25 NOTE — Patient Instructions (Addendum)
I am going to send in a medication called Protonix to help with acid reflux - take daily for 30 days   Pick up an antifungal such as Lotrimin and apply twice a day for two weeks   I am going to refer you to GI for a colonoscopy- make sure you schedule    

## 2022-07-25 NOTE — Patient Instructions (Addendum)
I am going to send in a medication called Protonix to help with acid reflux - take daily for 30 days   Pick up an antifungal such as Lotrimin and apply twice a day for two weeks   I am going to refer you to GI for a colonoscopy- make sure you schedule

## 2022-07-25 NOTE — Progress Notes (Signed)
Subjective:    Patient ID: April Gonzalez, female    DOB: 12-01-1960, 62 y.o.   MRN: [id number]  HPI 62 year old female who  has a past medical history of Allergy, Anemia (05/2012), Fibroids, GERD (gastroesophageal reflux disease), H/O uterine prolapse, History of blood transfusion, Hyperlipidemia, Hypertension, Rectal bleeding, Seizures ([place]), and Thyroid disorder.  She presents to the office today for an acute issue of blood in stool and painful bowel movements. She reports that over the last 2-4 weeks she has been noticing bright red blood after wiping. This happens every day during every bowel movements. She also has been experiencing abdominal pain across the abdomen and itching around her rectum.   She denies vomiting but does have nausea. Has not been constipated.   Her last colonoscopy was in 2018 and she was supposed to follow up in 5 years. She has had multiple no shows and has not been able to be reached to schedule.   Review of Systems See HPI   Past Medical History:  Diagnosis Date   Allergy    Anemia 05/2012   transfusion   Fibroids    H/O myomectomy   GERD (gastroesophageal reflux disease)    rare    H/O uterine prolapse    History of blood transfusion    x 3    Hyperlipidemia    Hypertension    Rectal bleeding    NOTED AT pv 01-09-17- PT STATES with most BM's for the last 2 years, also hurts    Seizures ([place])    last seizure age 61 or 10 per pt report-01-09-17 no change in this   Thyroid disorder     Social History   Socioeconomic History   Marital status: Married    Spouse name: Not on file   Number of children: 2   Years of education: 15   Highest education level: Not on file  Occupational History   Not on file  Tobacco Use   Smoking status: Never   Smokeless tobacco: Never  Substance and Sexual Activity   Alcohol use: Yes    Alcohol/week: 0.0 standard drinks of alcohol    Comment: rare   Drug use: No   Sexual activity: Yes    Birth  control/protection: Surgical    Comment: BTL   Other Topics Concern   Not on file  Social History Narrative   She works for [employer]    Married   Has 2 children    Social Determinants of Health   Financial Resource Strain: Not on file  Food Insecurity: Not on file  Transportation Needs: Not on file  Physical Activity: Not on file  Stress: Not on file  Social Connections: Not on file  Intimate Partner Violence: Not on file    Past Surgical History:  Procedure Laterality Date   ANKLE SURGERY     One plate, seven screws in the right ankle    BLADDER SUSPENSION N/A 12/10/2012   Procedure: TRANSVAGINAL TAPE (TVT) PROCEDURE;  Surgeon: Delice Lesch, MD;  Location: [facility];  Service: Gynecology;  Laterality: N/A;   CYSTOCELE REPAIR N/A 12/10/2012   Procedure: ANTERIOR REPAIR (CYSTOCELE);  Surgeon: Eldred Manges, MD;  Location: [facility];  Service: Gynecology;  Laterality: N/A;   CYSTOSCOPY N/A 12/10/2012   Procedure: CYSTOSCOPY;  Surgeon: Delice Lesch, MD;  Location: [facility];  Service: Gynecology;  Laterality: N/A;   EXCISION VAGINAL CYST     MYOMECTOMY  20YRS AGO   HYSTEROSCOPIC  TUBAL LIGATION  10/10/1991   VAGINAL HYSTERECTOMY N/A 12/10/2012   Procedure: Total Vaginal Hysterectomy;  Surgeon: Eldred Manges, MD;  Location: [facility];  Service: Gynecology;  Laterality: N/A;    Family History  Problem Relation Age of Onset   Heart failure Father    Diabetes Mother    High blood pressure Mother    Arthritis Mother    Breast cancer Maternal Aunt        in her 45's    Breast cancer Cousin    Thyroid disease Sister    Colon cancer Neg Hx    Colon polyps Neg Hx    Esophageal cancer Neg Hx    Rectal cancer Neg Hx    Stomach cancer Neg Hx     Allergies  Allergen Reactions   Methimazole Anaphylaxis    EMS had to be called and a 7-day's stay in the hospital resulted   Nitroglycerin Other (See Comments)    Sweating    Other Itching and Other (See Comments)    Seasonal  allergies & dog hair = runny nose also   Tetracycline Hcl Hypertension   Lisinopril Cough   Promethazine Nausea Only    Made the patient feel like she was going to vomit and it "spaced" her out   Shrimp [Shellfish Allergy] Itching   Latex Rash   Penicillins Other (See Comments)    Has patient had a PCN reaction causing immediate rash, facial/tongue/throat swelling, SOB or lightheadedness with hypotension: Unk Has patient had a PCN reaction causing severe rash involving mucus membranes or skin necrosis: Unk Has patient had a PCN reaction that required hospitalization: Unk Has patient had a PCN reaction occurring within the last 10 years: Yes If all of the above answers are "NO", then may proceed with Cephalosporin use.    Tape Rash   Terbutaline Other (See Comments)    Reaction not recalled (??)    Current Outpatient Medications on File Prior to Visit  Medication Sig Dispense Refill   acetaminophen (TYLENOL) 500 MG tablet Take 500-1,000 mg by mouth every 6 (six) hours as needed for mild pain or headache.     amLODipine (NORVASC) 10 MG tablet TAKE 1 TABLET BY MOUTH DAILY 90 tablet 0   azelastine (ASTELIN) 0.1 % nasal spray Place 2 sprays into both nostrils 2 (two) times daily. Use in each nostril as directed 30 mL 6   buPROPion (WELLBUTRIN XL) 150 MG 24 hr tablet Take 1 tablet (150 mg total) by mouth daily. 90 tablet 0   clobetasol cream (TEMOVATE) [id number] % Apply 1 Application topically 2 (two) times daily. 30 g 1   EPINEPHrine (EPIPEN 2-PAK) 0.3 mg/0.3 mL IJ SOAJ injection Inject 0.3 mLs (0.3 mg total) into the muscle once for 1 dose. If you develop difficulty breathing 0.3 mL 0   silver sulfADIAZINE (SILVADENE) 1 % cream Apply 1 Application topically daily. 50 g 0   No current facility-administered medications on file prior to visit.    LMP 12/03/2012       Objective:   Physical Exam Vitals and nursing note reviewed. Exam conducted with a chaperone present.  Constitutional:       Appearance: Normal appearance.  Cardiovascular:     Rate and Rhythm: Normal rate and regular rhythm.     Pulses: Normal pulses.     Heart sounds: Normal heart sounds.  Pulmonary:     Effort: Pulmonary effort is normal.     Breath sounds: Normal breath sounds.  Abdominal:     General: Abdomen is flat. Bowel sounds are normal. There is no distension.     Palpations: Abdomen is soft. There is no mass.     Tenderness: There is abdominal tenderness in the epigastric area.  Genitourinary:    Rectum: Guaiac result negative. Internal hemorrhoid present. No tenderness or anal fissure.     Comments: Tinea infection noted around rectum  Skin:    Findings: Rash present.  Neurological:     General: No focal deficit present.     Mental Status: She is alert and oriented to person, place, and time.  Psychiatric:        Mood and Affect: Mood normal.        Behavior: Behavior normal.        Thought Content: Thought content normal.        Judgment: Judgment normal.       Assessment & Plan:  1. Rectal bleeding - Hemoccult negative in the office today  - Likely from internal hemorrhoids but is overdue for colonoscopy.  - Ambulatory referral to Gastroenterology  2. Gastroesophageal reflux disease with esophagitis without hemorrhage  - pantoprazole (PROTONIX) 40 MG tablet; Take 1 tablet (40 mg total) by mouth daily.  Dispense: 30 tablet; Refill: 3 - sucralfate (CARAFATE) 1 GM/10ML suspension; Take 10 mLs (1 g total) by mouth 4 (four) times daily -  with meals and at bedtime.  Dispense: 420 mL; Refill: 0  3. Tinea - OTC lotrimin cream BID x 2 weeks   [name], NP  Time spent with patient today was 31 minutes which consisted of chart review, discussing diagnosis, work up, treatment answering questions and documentation.

## 2022-07-25 NOTE — Progress Notes (Signed)
 Subjective:    Patient ID: Jody [name], female    DOB: 02/01/1961, 62 y.o.   MRN: [id number]  HPI 62 year old female who  has a past medical history of Allergy, Anemia (05/2012), Fibroids, GERD (gastroesophageal reflux disease), H/O uterine prolapse, History of blood transfusion, Hyperlipidemia, Hypertension, Rectal bleeding, Seizures ([facility]), and Thyroid disorder.  She presents to the office today for an acute issue of blood in stool and painful bowel movements. She reports that over the last 2-4 weeks she has been noticing bright red blood after wiping. This happens every day during every bowel movements. She also has been experiencing abdominal pain across the abdomen and itching around her rectum.   She denies vomiting but does have nausea. Has not been constipated.   Her last colonoscopy was in 2018 and she was supposed to follow up in 5 years. She has had multiple no shows and has not been able to be reached to schedule.   Review of Systems See HPI   Past Medical History:  Diagnosis Date   Allergy    Anemia 05/2012   transfusion   Fibroids    H/O myomectomy   GERD (gastroesophageal reflux disease)    rare    H/O uterine prolapse    History of blood transfusion    x 3    Hyperlipidemia    Hypertension    Rectal bleeding    NOTED AT pv 01-09-17- PT STATES with most BM's for the last 2 years, also hurts    Seizures ([facility])    last seizure age 9 or 10 per pt report-01-09-17 no change in this   Thyroid disorder     Social History   Socioeconomic History   Marital status: Married    Spouse name: Not on file   Number of children: 2   Years of education: 15   Highest education level: Not on file  Occupational History   Not on file  Tobacco Use   Smoking status: Never   Smokeless tobacco: Never  Substance and Sexual Activity   Alcohol use: Yes    Alcohol/week: 0.0 standard drinks of alcohol    Comment: rare   Drug use: No   Sexual activity: Yes    Birth  control/protection: Surgical    Comment: BTL   Other Topics Concern   Not on file  Social History Narrative   She works for [employer]    Married   Has 2 children    Social Determinants of Health   Financial Resource Strain: Not on file  Food Insecurity: Not on file  Transportation Needs: Not on file  Physical Activity: Not on file  Stress: Not on file  Social Connections: Not on file  Intimate Partner Violence: Not on file    Past Surgical History:  Procedure Laterality Date   ANKLE SURGERY     One plate, seven screws in the right ankle    BLADDER SUSPENSION N/A 12/10/2012   Procedure: TRANSVAGINAL TAPE (TVT) PROCEDURE;  Surgeon: Angela Y Roberts, MD;  Location: [facility];  Service: Gynecology;  Laterality: N/A;   CYSTOCELE REPAIR N/A 12/10/2012   Procedure: ANTERIOR REPAIR (CYSTOCELE);  Surgeon: Vanessa P Haygood, MD;  Location: [facility];  Service: Gynecology;  Laterality: N/A;   CYSTOSCOPY N/A 12/10/2012   Procedure: CYSTOSCOPY;  Surgeon: Angela Y Roberts, MD;  Location: [facility];  Service: Gynecology;  Laterality: N/A;   EXCISION VAGINAL CYST     MYOMECTOMY  10YRS AGO   HYSTEROSCOPIC     TUBAL LIGATION  10/10/1991   VAGINAL HYSTERECTOMY N/A 12/10/2012   Procedure: Total Vaginal Hysterectomy;  Surgeon: Vanessa P Haygood, MD;  Location: [facility];  Service: Gynecology;  Laterality: N/A;    Family History  Problem Relation Age of Onset   Heart failure Father    Diabetes Mother    High blood pressure Mother    Arthritis Mother    Breast cancer Maternal Aunt        in her 50's    Breast cancer Cousin    Thyroid disease Sister    Colon cancer Neg Hx    Colon polyps Neg Hx    Esophageal cancer Neg Hx    Rectal cancer Neg Hx    Stomach cancer Neg Hx     Allergies  Allergen Reactions   Methimazole Anaphylaxis    EMS had to be called and a 7-day's stay in the hospital resulted   Nitroglycerin Other (See Comments)    Sweating    Other Itching and Other (See Comments)    Seasonal  allergies & dog hair = runny nose also   Tetracycline Hcl Hypertension   Lisinopril Cough   Promethazine Nausea Only    Made the patient feel like she was going to vomit and it "spaced" her out   Shrimp [Shellfish Allergy] Itching   Latex Rash   Penicillins Other (See Comments)    Has patient had a PCN reaction causing immediate rash, facial/tongue/throat swelling, SOB or lightheadedness with hypotension: Unk Has patient had a PCN reaction causing severe rash involving mucus membranes or skin necrosis: Unk Has patient had a PCN reaction that required hospitalization: Unk Has patient had a PCN reaction occurring within the last 10 years: Yes If all of the above answers are "NO", then may proceed with Cephalosporin use.    Tape Rash   Terbutaline Other (See Comments)    Reaction not recalled (??)    Current Outpatient Medications on File Prior to Visit  Medication Sig Dispense Refill   acetaminophen (TYLENOL) 500 MG tablet Take 500-1,000 mg by mouth every 6 (six) hours as needed for mild pain or headache.     amLODipine (NORVASC) 10 MG tablet TAKE 1 TABLET BY MOUTH DAILY 90 tablet 0   azelastine (ASTELIN) 0.1 % nasal spray Place 2 sprays into both nostrils 2 (two) times daily. Use in each nostril as directed 30 mL 6   buPROPion (WELLBUTRIN XL) 150 MG 24 hr tablet Take 1 tablet (150 mg total) by mouth daily. 90 tablet 0   clobetasol cream (TEMOVATE) 0.05 % Apply 1 Application topically 2 (two) times daily. 30 g 1   EPINEPHrine (EPIPEN 2-PAK) 0.3 mg/0.3 mL IJ SOAJ injection Inject 0.3 mLs (0.3 mg total) into the muscle once for 1 dose. If you develop difficulty breathing 0.3 mL 0   silver sulfADIAZINE (SILVADENE) 1 % cream Apply 1 Application topically daily. 50 g 0   No current facility-administered medications on file prior to visit.    LMP 12/03/2012       Objective:   Physical Exam Vitals and nursing note reviewed. Exam conducted with a chaperone present.  Constitutional:       Appearance: Normal appearance.  Cardiovascular:     Rate and Rhythm: Normal rate and regular rhythm.     Pulses: Normal pulses.     Heart sounds: Normal heart sounds.  Pulmonary:     Effort: Pulmonary effort is normal.     Breath sounds: Normal breath sounds.    Abdominal:     General: Abdomen is flat. Bowel sounds are normal. There is no distension.     Palpations: Abdomen is soft. There is no mass.     Tenderness: There is abdominal tenderness in the epigastric area.  Genitourinary:    Rectum: Guaiac result negative. Internal hemorrhoid present. No tenderness or anal fissure.     Comments: Tinea infection noted around rectum  Skin:    Findings: Rash present.  Neurological:     General: No focal deficit present.     Mental Status: She is alert and oriented to person, place, and time.  Psychiatric:        Mood and Affect: Mood normal.        Behavior: Behavior normal.        Thought Content: Thought content normal.        Judgment: Judgment normal.       Assessment & Plan:  1. Rectal bleeding - Hemoccult negative in the office today  - Likely from internal hemorrhoids but is overdue for colonoscopy.  - Ambulatory referral to Gastroenterology  2. Gastroesophageal reflux disease with esophagitis without hemorrhage  - pantoprazole (PROTONIX) 40 MG tablet; Take 1 tablet (40 mg total) by mouth daily.  Dispense: 30 tablet; Refill: 3 - sucralfate (CARAFATE) 1 GM/10ML suspension; Take 10 mLs (1 g total) by mouth 4 (four) times daily -  with meals and at bedtime.  Dispense: 420 mL; Refill: 0  3. Tinea - OTC lotrimin cream BID x 2 weeks   Cory Nafziger, NP  Time spent with patient today was 31 minutes which consisted of chart review, discussing diagnosis, work up, treatment answering questions and documentation.  

## 2022-07-31 ENCOUNTER — Ambulatory Visit: Admitting: Adult Health

## 2022-07-31 ENCOUNTER — Telehealth: Payer: Self-pay | Admitting: Adult Health

## 2022-07-31 ENCOUNTER — Telehealth: Admitting: Adult Health

## 2022-07-31 NOTE — Telephone Encounter (Signed)
Noted  

## 2022-07-31 NOTE — Telephone Encounter (Signed)
wants to let you know her appointment for GI is set

## 2022-08-07 ENCOUNTER — Emergency Department (HOSPITAL_BASED_OUTPATIENT_CLINIC_OR_DEPARTMENT_OTHER): Admitting: Radiology

## 2022-08-07 ENCOUNTER — Other Ambulatory Visit: Payer: Self-pay

## 2022-08-07 ENCOUNTER — Encounter (HOSPITAL_BASED_OUTPATIENT_CLINIC_OR_DEPARTMENT_OTHER): Payer: Self-pay

## 2022-08-07 ENCOUNTER — Emergency Department (HOSPITAL_BASED_OUTPATIENT_CLINIC_OR_DEPARTMENT_OTHER)
Admission: EM | Admit: 2022-08-07 | Discharge: 2022-08-07 | Disposition: A | Discharge status: Home / Self Care | Attending: Emergency Medicine | Admitting: Emergency Medicine

## 2022-08-07 DIAGNOSIS — I10 Essential (primary) hypertension: Secondary | ICD-10-CM | POA: Insufficient documentation

## 2022-08-07 DIAGNOSIS — R0789 Other chest pain: Secondary | ICD-10-CM | POA: Diagnosis not present

## 2022-08-07 DIAGNOSIS — Z91040 Latex allergy status: Secondary | ICD-10-CM | POA: Insufficient documentation

## 2022-08-07 DIAGNOSIS — M542 Cervicalgia: Secondary | ICD-10-CM | POA: Diagnosis not present

## 2022-08-07 DIAGNOSIS — Z79899 Other long term (current) drug therapy: Secondary | ICD-10-CM | POA: Insufficient documentation

## 2022-08-07 DIAGNOSIS — M7918 Myalgia, other site: Secondary | ICD-10-CM

## 2022-08-07 LAB — CBC
HCT: 41.2 % (ref 36.0–46.0)
Hemoglobin: 13.7 g/dL (ref 12.0–15.0)
MCH: 28.8 pg (ref 26.0–34.0)
MCHC: 33.3 g/dL (ref 30.0–36.0)
MCV: 86.7 fL (ref 80.0–100.0)
Platelets: 233 10*3/uL (ref 150–400)
RBC: 4.75 MIL/uL (ref 3.87–5.11)
RDW: 11.9 % (ref 11.5–15.5)
WBC: 8.2 10*3/uL (ref 4.0–10.5)
nRBC: 0 % (ref 0.0–0.2)

## 2022-08-07 LAB — TROPONIN I (HIGH SENSITIVITY): Troponin I (High Sensitivity): 2 ng/L (ref <18)

## 2022-08-07 LAB — BASIC METABOLIC PANEL
Anion gap: 10 (ref 5–15)
BUN: 15 mg/dL (ref 6–20)
CO2: 23 mmol/L (ref 22–32)
Calcium: 9.6 mg/dL (ref 8.9–10.3)
Chloride: 105 mmol/L (ref 98–111)
Creatinine, Ser: 0.9 mg/dL (ref 0.44–1.00)
GFR, Estimated: >60 mL/min (ref >60)
Glucose, Bld: 130 mg/dL — ABNORMAL HIGH (ref 70–99)
Potassium: 3.7 mmol/L (ref 3.5–5.1)
Sodium: 138 mmol/L (ref 135–145)

## 2022-08-07 MED ORDER — METHOCARBAMOL 500 MG PO TABS: 500.0000 mg | ORAL_TABLET | Freq: Three times a day (TID) | ORAL | 0 refills | Status: DC | PRN

## 2022-08-07 NOTE — ED Provider Notes (Signed)
[facility] Provider Note   CSN: [id number] Arrival date & time: 08/07/22  1733     History  Chief Complaint  Patient presents with   Chest Pain    April Gonzalez is a 62 y.o. female.   Chest Pain Patient presents with neck pain and chest pain.  Was at [employer] began to have acute sharp left neck pain that would last a second and then go away.  Not exertional.  Also had pain in upper chest at times.  No numbness weakness.  Was not having shortness of breath.  Feeling somewhat better now.  No diaphoresis.  No nausea or vomiting.  No numbness weakness or headache.    Past Medical History:  Diagnosis Date   Allergy    Anemia 05/2012   transfusion   Fibroids    H/O myomectomy   GERD (gastroesophageal reflux disease)    rare    H/O uterine prolapse    History of blood transfusion    x 3    Hyperlipidemia    Hypertension    Rectal bleeding    NOTED AT pv 01-09-17- PT STATES with most BM's for the last 2 years, also hurts    Seizures    last seizure age 60 or 10 per pt report-01-09-17 no change in this   Thyroid disorder     Home Medications Prior to Admission medications   Medication Sig Start Date End Date Taking? Authorizing Provider  methocarbamol (ROBAXIN) 500 MG tablet Take 1 tablet (500 mg total) by mouth every 8 (eight) hours as needed for muscle spasms. 08/07/22  Yes Davonna Belling, MD  acetaminophen (TYLENOL) 500 MG tablet Take 500-1,000 mg by mouth every 6 (six) hours as needed for mild pain or headache.    [provider]  amLODipine (NORVASC) 10 MG tablet TAKE 1 TABLET BY MOUTH DAILY 07/09/22   Nafziger, Tommi Rumps, NP  azelastine (ASTELIN) 0.1 % nasal spray Place 2 sprays into both nostrils 2 (two) times daily. Use in each nostril as directed 04/11/22   Nafziger, Tommi Rumps, NP  buPROPion (WELLBUTRIN XL) 150 MG 24 hr tablet Take 1 tablet (150 mg total) by mouth daily. 07/10/22   Nafziger, Tommi Rumps, NP  clobetasol cream (TEMOVATE)  [id number] % Apply 1 Application topically 2 (two) times daily. 01/30/22   Nafziger, Tommi Rumps, NP  EPINEPHrine (EPIPEN 2-PAK) 0.3 mg/0.3 mL IJ SOAJ injection Inject 0.3 mLs (0.3 mg total) into the muscle once for 1 dose. If you develop difficulty breathing 12/07/19 07/10/22  Nafziger, Tommi Rumps, NP  pantoprazole (PROTONIX) 40 MG tablet Take 1 tablet (40 mg total) by mouth daily. 07/25/22   Nafziger, Tommi Rumps, NP  silver sulfADIAZINE (SILVADENE) 1 % cream Apply 1 Application topically daily. 01/29/22   Nafziger, Tommi Rumps, NP  sucralfate (CARAFATE) 1 GM/10ML suspension Take 10 mLs (1 g total) by mouth 4 (four) times daily -  with meals and at bedtime. 07/25/22   Nafziger, Tommi Rumps, NP      Allergies    Methimazole, Nitroglycerin, Other, Tetracycline hcl, Lisinopril, Promethazine, Shrimp [shellfish allergy], Latex, Penicillins, Tape, and Terbutaline    Review of Systems   Review of Systems  Cardiovascular:  Positive for chest pain.    Physical Exam Updated Vital Signs BP (!) 164/93 (BP Location: Left Arm)   Pulse 85   Temp 98.9 F (37.2 C) (Oral)   Resp 18   Ht 5\' 4"  (1.626 m)   Wt 88 kg   LMP 12/03/2012   SpO2  96%   BMI 33.30 kg/m  Physical Exam Vitals reviewed.  Neck:     Comments: Some tenderness over trapezius on left. Cardiovascular:     Rate and Rhythm: Regular rhythm.  Pulmonary:     Breath sounds: No wheezing or rhonchi.  Chest:     Chest wall: Tenderness present.     Comments: Mild tenderness bilaterally on lower lateral chest wall. Musculoskeletal:     Cervical back: Neck supple.     Right lower leg: No tenderness.     Left lower leg: No tenderness.  Skin:    General: Skin is warm.  Neurological:     Mental Status: She is alert.     ED Results / Procedures / Treatments   Labs (all labs ordered are listed, but only abnormal results are displayed) Labs Reviewed  BASIC METABOLIC PANEL - Abnormal; Notable for the following components:      Result Value   Glucose, Bld 130 (*)    All other  components within normal limits  CBC  TROPONIN I (HIGH SENSITIVITY)    EKG EKG Interpretation  Date/Time:  Wednesday August 07 2022 17:44:02 EDT Ventricular Rate:  89 PR Interval:  160 QRS Duration: 72 QT Interval:  352 QTC Calculation: 428 R Axis:   12 Text Interpretation: Normal sinus rhythm Minimal voltage criteria for LVH, may be normal variant ( R in aVL ) Borderline ECG When compared with ECG of 17-Jun-2020 11:03, No significant change since last tracing Confirmed by Davonna Belling [phone]) on 08/07/2022 6:55:53 PM  Radiology DG Chest 2 View  Result Date: 08/07/2022 CLINICAL DATA:  Chest pain EXAM: CHEST - 2 VIEW COMPARISON:  10/17/2021 FINDINGS: Slight basilar bilateral scar or atelectasis. No consolidation, pneumothorax or effusion. No edema. Normal cardiopericardial silhouette. IMPRESSION: Mild basilar atelectasis or scar. Electronically Signed   By: Jill Side M.D.   On: 08/07/2022 18:08    Procedures Procedures    Medications Ordered in ED Medications - No data to display  ED Course/ Medical Decision Making/ A&P                             Medical Decision Making Amount and/or Complexity of Data Reviewed Labs: ordered. Radiology: ordered.  Risk Prescription drug management.   Patient with left-sided neck pain that did go to her chest.  Sharp.  However only lasted a second.  With spasm and then released.  EKG reassuring.  Troponin negative.  Chest x-ray reassuring.  Doubt cardiac ischemia as the cause.  Feeling better now.  Doubt aortic cause doubt carotid dissection.  Will treat symptomatically with muscle relaxers.  No rash.  Does have hypertension that we will need to be followed.  Will discharge home.        Final Clinical Impression(s) / ED Diagnoses Final diagnoses:  Musculoskeletal pain    Rx / DC Orders ED Discharge Orders          Ordered    methocarbamol (ROBAXIN) 500 MG tablet  Every 8 hours PRN        08/07/22 1903               Davonna Belling, MD 08/07/22 1905

## 2022-08-07 NOTE — ED Triage Notes (Signed)
Patient here POV from Home.  Endorses being at [employer] an hour ago when she felt a Sharp Pain from her Left Ear down her Neck and Jaw. Also notes a Separate Pain in Chest that is Sharp mostly. Both pains are intermittent.   No SOB.   NAD noted during Triage. A&Ox4. GCS 15. BIB Wheelchair.

## 2022-08-07 NOTE — ED Notes (Signed)
RN reviewed discharge instructions with pt. Pt verbalized understanding and had no further questions. VSS upon discharge.  

## 2022-08-21 ENCOUNTER — Ambulatory Visit: Admitting: Gastroenterology

## 2022-09-27 ENCOUNTER — Emergency Department (HOSPITAL_COMMUNITY)
Admission: EM | Admit: 2022-09-27 | Discharge: 2022-09-28 | Disposition: A | Discharge status: Home / Self Care | Attending: Emergency Medicine | Admitting: Emergency Medicine

## 2022-09-27 ENCOUNTER — Other Ambulatory Visit: Payer: Self-pay

## 2022-09-27 DIAGNOSIS — K55069 Acute infarction of intestine, part and extent unspecified: Secondary | ICD-10-CM | POA: Insufficient documentation

## 2022-09-27 DIAGNOSIS — Z91040 Latex allergy status: Secondary | ICD-10-CM | POA: Diagnosis not present

## 2022-09-27 DIAGNOSIS — I10 Essential (primary) hypertension: Secondary | ICD-10-CM | POA: Diagnosis not present

## 2022-09-27 DIAGNOSIS — Z79899 Other long term (current) drug therapy: Secondary | ICD-10-CM | POA: Diagnosis not present

## 2022-09-27 DIAGNOSIS — R1032 Left lower quadrant pain: Secondary | ICD-10-CM | POA: Diagnosis present

## 2022-09-27 MED ORDER — HYDROCODONE-ACETAMINOPHEN 5-325 MG PO TABS
1.0000 | ORAL_TABLET | Freq: Once | ORAL | Status: AC
  Administered 2022-09-27: 1 via ORAL
  Filled 2022-09-27: qty 1

## 2022-09-27 NOTE — ED Triage Notes (Signed)
Pt from EMS c/o abd pain left side and mid lower abd pain 4 days 10/10. States had a black stool 12hours ago. No n/v. Blood sugar 166

## 2022-09-27 NOTE — ED Notes (Signed)
Sister Paul Dykes [phone] would like an update immediately, she's in the waiting room and would like to see her sister

## 2022-09-27 NOTE — ED Provider Triage Note (Signed)
Emergency Medicine Provider Triage Evaluation Note  April Gonzalez , a 62 y.o. female  was evaluated in triage.  Pt complains of abdominal pain for 6 days. No BM for 4 days until today, had black stool.  Review of Systems  Positive: Abdominal pain Negative: Chest pain, fever, gross blood  Physical Exam  BP (!) 152/87 (BP Location: Right Arm)   Pulse (!) 105   Temp 98.2 F (36.8 C) (Oral)   Resp 18   Ht 5\' 3"  (1.6 m)   Wt 83.9 kg   LMP 12/03/2012   SpO2 97%   BMI 32.77 kg/m  Gen:   Awake, no distress   Resp:  Normal effort  Abd:   Diffuse abdominal tenderness, worst on left  Medical Decision Making  Medically screening exam initiated at 10:46 PM.  Appropriate orders placed.  April Gonzalez was informed that the remainder of the evaluation will be completed by another provider, this initial triage assessment does not replace that evaluation, and the importance of remaining in the ED until their evaluation is complete.  Will get labs, EKG, and order abd CT.   April Loveless, MD 09/27/22 [phone]

## 2022-09-28 ENCOUNTER — Emergency Department (HOSPITAL_COMMUNITY)

## 2022-09-28 LAB — I-STAT CHEM 8, ED
BUN: 9 mg/dL (ref 6–20)
Calcium, Ion: 1.15 mmol/L (ref 1.15–1.40)
Chloride: 106 mmol/L (ref 98–111)
Creatinine, Ser: 0.7 mg/dL (ref 0.44–1.00)
Glucose, Bld: 121 mg/dL — ABNORMAL HIGH (ref 70–99)
HCT: 43 % (ref 36.0–46.0)
Hemoglobin: 14.6 g/dL (ref 12.0–15.0)
Potassium: 4 mmol/L (ref 3.5–5.1)
Sodium: 139 mmol/L (ref 135–145)
TCO2: 25 mmol/L (ref 22–32)

## 2022-09-28 LAB — COMPREHENSIVE METABOLIC PANEL
ALT: 15 U/L (ref 0–44)
AST: 13 U/L — ABNORMAL LOW (ref 15–41)
Albumin: 3.9 g/dL (ref 3.5–5.0)
Alkaline Phosphatase: 111 U/L (ref 38–126)
Anion gap: 11 (ref 5–15)
BUN: 8 mg/dL (ref 6–20)
CO2: 22 mmol/L (ref 22–32)
Calcium: 9.2 mg/dL (ref 8.9–10.3)
Chloride: 104 mmol/L (ref 98–111)
Creatinine, Ser: 0.81 mg/dL (ref 0.44–1.00)
GFR, Estimated: >60 mL/min (ref >60)
Glucose, Bld: 125 mg/dL — ABNORMAL HIGH (ref 70–99)
Potassium: 3.9 mmol/L (ref 3.5–5.1)
Sodium: 137 mmol/L (ref 135–145)
Total Bilirubin: 0.7 mg/dL (ref 0.3–1.2)
Total Protein: 7.6 g/dL (ref 6.5–8.1)

## 2022-09-28 LAB — URINALYSIS, W/ REFLEX TO CULTURE (INFECTION SUSPECTED)
Bacteria, UA: NONE SEEN
Bilirubin Urine: NEGATIVE
Glucose, UA: NEGATIVE mg/dL
Hgb urine dipstick: NEGATIVE
Ketones, ur: NEGATIVE mg/dL
Leukocytes,Ua: NEGATIVE
Nitrite: NEGATIVE
Protein, ur: NEGATIVE mg/dL
Specific Gravity, Urine: 1.014 (ref 1.005–1.030)
pH: 6 (ref 5.0–8.0)

## 2022-09-28 LAB — CBC WITH DIFFERENTIAL/PLATELET
Abs Immature Granulocytes: 0.02 10*3/uL (ref 0.00–0.07)
Basophils Absolute: 0 10*3/uL (ref 0.0–0.1)
Basophils Relative: 0 %
Eosinophils Absolute: 0.1 10*3/uL (ref 0.0–0.5)
Eosinophils Relative: 2 %
HCT: 43.6 % (ref 36.0–46.0)
Hemoglobin: 14 g/dL (ref 12.0–15.0)
Immature Granulocytes: 0 %
Lymphocytes Relative: 21 %
Lymphs Abs: 1.9 10*3/uL (ref 0.7–4.0)
MCH: 28 pg (ref 26.0–34.0)
MCHC: 32.1 g/dL (ref 30.0–36.0)
MCV: 87.2 fL (ref 80.0–100.0)
Monocytes Absolute: 0.9 10*3/uL (ref 0.1–1.0)
Monocytes Relative: 10 %
Neutro Abs: 6 10*3/uL (ref 1.7–7.7)
Neutrophils Relative %: 67 %
Platelets: 212 10*3/uL (ref 150–400)
RBC: 5 MIL/uL (ref 3.87–5.11)
RDW: 12.2 % (ref 11.5–15.5)
WBC: 9 10*3/uL (ref 4.0–10.5)
nRBC: 0 % (ref 0.0–0.2)

## 2022-09-28 LAB — LIPASE, BLOOD: Lipase: 40 U/L (ref 11–51)

## 2022-09-28 LAB — PROTIME-INR
INR: 1.1 (ref 0.8–1.2)
Prothrombin Time: 14.9 seconds (ref 11.4–15.2)

## 2022-09-28 MED ORDER — LACTATED RINGERS IV BOLUS
1000.0000 mL | Freq: Once | INTRAVENOUS | Status: AC
  Administered 2022-09-28: 1000 mL via INTRAVENOUS

## 2022-09-28 MED ORDER — OXYCODONE-ACETAMINOPHEN 5-325 MG PO TABS: 1.0000 | ORAL_TABLET | Freq: Four times a day (QID) | ORAL | 0 refills | Status: DC | PRN

## 2022-09-28 MED ORDER — ONDANSETRON HCL 4 MG/2ML IJ SOLN
4.0000 mg | Freq: Once | INTRAMUSCULAR | Status: AC
  Administered 2022-09-28: 4 mg via INTRAVENOUS
  Filled 2022-09-28: qty 2

## 2022-09-28 MED ORDER — DIPHENHYDRAMINE HCL 25 MG PO CAPS
25.0000 mg | ORAL_CAPSULE | Freq: Once | ORAL | Status: AC
  Administered 2022-09-28: 25 mg via ORAL
  Filled 2022-09-28: qty 1

## 2022-09-28 MED ORDER — METOCLOPRAMIDE HCL 5 MG/ML IJ SOLN
10.0000 mg | Freq: Once | INTRAMUSCULAR | Status: AC
  Administered 2022-09-28: 10 mg via INTRAVENOUS
  Filled 2022-09-28: qty 2

## 2022-09-28 MED ORDER — HYDROMORPHONE HCL 1 MG/ML IJ SOLN
0.5000 mg | Freq: Once | INTRAMUSCULAR | Status: AC
  Administered 2022-09-28: 0.5 mg via INTRAVENOUS
  Filled 2022-09-28: qty 1

## 2022-09-28 MED ORDER — MORPHINE SULFATE (PF) 4 MG/ML IV SOLN
4.0000 mg | Freq: Once | INTRAVENOUS | Status: AC
  Administered 2022-09-28: 4 mg via INTRAVENOUS
  Filled 2022-09-28: qty 1

## 2022-09-28 MED ORDER — IOHEXOL 300 MG/ML  SOLN
85.0000 mL | Freq: Once | INTRAMUSCULAR | Status: AC | PRN
  Administered 2022-09-28: 85 mL via INTRAVENOUS

## 2022-09-28 MED ORDER — ONDANSETRON 4 MG PO TBDP: 4.0000 mg | ORAL_TABLET | Freq: Three times a day (TID) | ORAL | 0 refills | Status: DC | PRN

## 2022-09-28 NOTE — ED Notes (Signed)
Pt ate crackers and drank water and says she feels okay.

## 2022-09-28 NOTE — ED Provider Notes (Signed)
-------------------------------------------------------------------------------  Attestation signed by Dione Booze, MD at 09/30/2022  3:22 AM  Attestation: Medical screening examination/treatment/procedure(s) were performed by non-physician practitioner and as supervising physician I was immediately available for consultation/collaboration.    EKG Interpretation    Date/Time:  Friday Sep 27 2022 23:32:25 EDT  Ventricular Rate:  93  PR Interval:  164  QRS Duration: 72  QT Interval:  354  QTC Calculation: 440  R Axis:   25  Text Interpretation: Normal sinus rhythm Right atrial enlargement  similar to April 2024 Confirmed by [name] [phone]) on 09/27/2022 11:38:35 PM  -------------------------------------------------------------------------------      [facility] EMERGENCY DEPARTMENT AT [facility]  Provider Note      CSN: [id number]  Arrival date & time: 09/27/22  2204         History    Chief Complaint   Patient presents with   . Abdominal Pain     Black stool 12h ago       Jody [name] is a 62 y.o. female.      Abdominal Pain    Patient is a 62 year old female with past medical history significant for fibroids, HTN, allergies, anemia, HLD, seizures as an infant has not had any seizures since send not on any AEDs    She is present emergency room today with complaints of 10/10 left lower quadrant abdominal pain that began approximately 4 days ago has been constant since.  No urinary frequency urgency dysuria or hematuria.  No chest pain difficulty breathing.  No fevers at home.  No lightheadedness or dizziness.  She has not had any nausea or vomiting or diarrhea.  She states that she also has a significant headache that is frontal/bitemporal.  No blurry vision double vision numbness weakness in any extremity and no lightheadedness or dizziness.  No syncope or near syncope.    She has a history of bladder suspension, cystocele repair, vaginal hysterectomy         Home Medications  Prior to Admission  medications    Medication Sig Start Date End Date Taking? Authorizing Provider   ondansetron (ZOFRAN-ODT) 4 MG disintegrating tablet Take 1 tablet (4 mg total) by mouth every 8 (eight) hours as needed for nausea or vomiting. 09/28/22  Yes Fondaw, Wylder S, PA   oxyCODONE-acetaminophen (PERCOCET/ROXICET) 5-325 MG tablet Take 1 tablet by mouth every 6 (six) hours as needed for severe pain. 09/28/22  Yes Fondaw, Stevphen Meuse S, PA   acetaminophen (TYLENOL) 500 MG tablet Take 500-1,000 mg by mouth every 6 (six) hours as needed for mild pain or headache.    [provider]   amLODipine (NORVASC) 10 MG tablet TAKE 1 TABLET BY MOUTH DAILY 07/09/22   Nafziger, Kandee Keen, NP   azelastine (ASTELIN) 0.1 % nasal spray Place 2 sprays into both nostrils 2 (two) times daily. Use in each nostril as directed 04/11/22   Nafziger, Kandee Keen, NP   buPROPion (WELLBUTRIN XL) 150 MG 24 hr tablet Take 1 tablet (150 mg total) by mouth daily. 07/10/22   Nafziger, Kandee Keen, NP   clobetasol cream (TEMOVATE) 0.05 % Apply 1 Application topically 2 (two) times daily. 01/30/22   Nafziger, Kandee Keen, NP   EPINEPHrine (EPIPEN 2-PAK) 0.3 mg/0.3 mL IJ SOAJ injection Inject 0.3 mLs (0.3 mg total) into the muscle once for 1 dose. If you develop difficulty breathing 12/07/19 07/10/22  Nafziger, Kandee Keen, NP   methocarbamol (ROBAXIN) 500 MG tablet Take 1 tablet (500 mg total) by mouth every 8 (eight) hours as  needed for muscle spasms. 08/07/22   Benjiman Core, MD   pantoprazole (PROTONIX) 40 MG tablet Take 1 tablet (40 mg total) by mouth daily. 07/25/22   Nafziger, Kandee Keen, NP   silver sulfADIAZINE (SILVADENE) 1 % cream Apply 1 Application topically daily. 01/29/22   Nafziger, Kandee Keen, NP   sucralfate (CARAFATE) 1 GM/10ML suspension Take 10 mLs (1 g total) by mouth 4 (four) times daily -  with meals and at bedtime. 07/25/22   Nafziger, Kandee Keen, NP         Allergies     Methimazole, Nitroglycerin, Other, Tetracycline hcl, Lisinopril, Promethazine, Shrimp [shellfish allergy], Latex, Penicillins,  Tape, and Terbutaline      Review of Systems    Review of Systems   Gastrointestinal:  Positive for abdominal pain.       Physical Exam  Updated Vital Signs  BP (!) 140/88   Pulse 73   Temp 98 F (36.7 C)   Resp 12   Ht 5\' 3"  (1.6 m)   Wt 83.9 kg   LMP 12/03/2012   SpO2 98%   BMI 32.77 kg/m   Physical Exam  Vitals and nursing note reviewed.   Constitutional:       General: She is not in acute distress.     Comments: Uncomfortable 62 year old female in no acute distress.  Uncomfortable but able to answer questions appropriately follow commands   HENT:      Head: Normocephalic and atraumatic.      Nose: Nose normal.   Eyes:      General: No scleral icterus.  Cardiovascular:      Rate and Rhythm: Normal rate and regular rhythm.      Pulses: Normal pulses.      Heart sounds: Normal heart sounds.   Pulmonary:      Effort: Pulmonary effort is normal. No respiratory distress.      Breath sounds: Normal breath sounds. No wheezing.   Abdominal:      Palpations: Abdomen is soft.      Tenderness: There is abdominal tenderness.      Comments: Left lower quadrant focal abdominal tenderness.  There is some voluntary guarding.  No rigidity.  Abdomen is soft and there is no bruising.  No other significant abdominal tenderness   Musculoskeletal:      Cervical back: Normal range of motion.      Right lower leg: No edema.      Left lower leg: No edema.   Skin:     General: Skin is warm and dry.      Capillary Refill: Capillary refill takes less than 2 seconds.   Neurological:      Mental Status: She is alert. Mental status is at baseline.   Psychiatric:         Mood and Affect: Mood normal.         Behavior: Behavior normal.         ED Results / Procedures / Treatments    Labs  (all labs ordered are listed, but only abnormal results are displayed)  Labs Reviewed   COMPREHENSIVE METABOLIC PANEL - Abnormal; Notable for the following components:       Result Value    Glucose, Bld 125 (*)     AST 13 (*)     All other components  within normal limits   I-STAT CHEM 8, ED - Abnormal; Notable for the following components:    Glucose, Bld 121 (*)     All other components  within normal limits   LIPASE, BLOOD   CBC WITH DIFFERENTIAL/PLATELET   URINALYSIS, W/ REFLEX TO CULTURE (INFECTION SUSPECTED)   PROTIME-INR       EKG  EKG Interpretation    Date/Time:  Friday Sep 27 2022 23:32:25 EDT  Ventricular Rate:  93  PR Interval:  164  QRS Duration: 72  QT Interval:  354  QTC Calculation: 440  R Axis:   25  Text Interpretation: Normal sinus rhythm Right atrial enlargement  similar to April 2024 Confirmed by [name] [phone]) on 09/27/2022 11:38:35 PM    Radiology  CT ABDOMEN PELVIS W CONTRAST    Result Date: 09/28/2022  CLINICAL DATA:  Left lower quadrant abdominal pain EXAM: CT ABDOMEN AND PELVIS WITH CONTRAST TECHNIQUE: Multidetector CT imaging of the abdomen and pelvis was performed using the standard protocol following bolus administration of intravenous contrast. RADIATION DOSE REDUCTION: This exam was performed according to the departmental dose-optimization program which includes automated exposure control, adjustment of the mA and/or kV according to patient size and/or use of iterative reconstruction technique. CONTRAST:  85mL OMNIPAQUE IOHEXOL 300 MG/ML  SOLN COMPARISON:  None Available. FINDINGS: Lower Chest: Normal. Hepatobiliary: Normal hepatic contours. No intra- or extrahepatic biliary dilatation. Normal gallbladder. Pancreas: Normal pancreas. No ductal dilatation or peripancreatic fluid collection. Spleen: Normal. Adrenals/Urinary Tract: The adrenal glands are normal. No hydronephrosis, nephroureterolithiasis or solid renal mass. The urinary bladder is normal for degree of distention Stomach/Bowel: There is no hiatal hernia. Normal duodenal course and caliber. No small bowel dilatation or inflammation. There is a focal area of inflammation adjacent to the splenic flexure, most consistent with omental infarction. Normal appendix.  Vascular/Lymphatic: Normal course and caliber of the major abdominal vessels. No abdominal or pelvic lymphadenopathy. Reproductive: Status post hysterectomy. No adnexal mass. Other: None. Musculoskeletal: No bony spinal canal stenosis or focal osseous abnormality. IMPRESSION: Focal area of inflammation adjacent to the splenic flexure, most consistent with omental infarction. Electronically Signed   By: Deatra Robinson M.D.   On: 09/28/2022 01:39      Procedures  Procedures       Medications Ordered in ED  Medications   HYDROcodone-acetaminophen (NORCO/VICODIN) 5-325 MG per tablet 1 tablet (1 tablet Oral Given 09/27/22 2305)   iohexol (OMNIPAQUE) 300 MG/ML solution 85 mL (85 mLs Intravenous Contrast Given 09/28/22 0117)   lactated ringers bolus 1,000 mL (0 mLs Intravenous Stopped 09/28/22 0425)   HYDROmorphone (DILAUDID) injection 0.5 mg (0.5 mg Intravenous Given 09/28/22 0217)   ondansetron (ZOFRAN) injection 4 mg (4 mg Intravenous Given 09/28/22 0217)   metoCLOPramide (REGLAN) injection 10 mg (10 mg Intravenous Given 09/28/22 0454)   diphenhydrAMINE (BENADRYL) capsule 25 mg (25 mg Oral Given 09/28/22 0455)   morphine (PF) 4 MG/ML injection 4 mg (4 mg Intravenous Given 09/28/22 0455)       ED Course/ Medical Decision Making/ A&P                                 Medical Decision Making  Amount and/or Complexity of Data Reviewed  Labs: ordered.    Risk  Prescription drug management.      This patient presents to the ED for concern of abd pain, this involves a number of treatment options, and is a complaint that carries with it a moderate to high risk of complications and morbidity. A differential diagnosis was considered for the patient's symptoms which is discussed below:  The causes of generalized abdominal pain include but are not limited to AAA, mesenteric ischemia, appendicitis, diverticulitis, DKA, gastritis, gastroenteritis, AMI, nephrolithiasis, pancreatitis, peritonitis, adrenal insufficiency,lead poisoning, iron  toxicity, intestinal ischemia, constipation, UTI,SBO/LBO, splenic rupture, biliary disease, IBD, IBS, PUD, or hepatitis.  Ectopic pregnancy, ovarian torsion, PID.       Co morbidities:  Discussed in HPI      Brief History:    Patient is a 62 year old female with past medical history significant for fibroids, HTN, allergies, anemia, HLD, seizures as an infant has not had any seizures since send not on any AEDs    She is present emergency room today with complaints of 10/10 left lower quadrant abdominal pain that began approximately 4 days ago has been constant since.  No urinary frequency urgency dysuria or hematuria.  No chest pain difficulty breathing.  No fevers at home.  No lightheadedness or dizziness.  She has not had any nausea or vomiting or diarrhea.  She states that she also has a significant headache that is frontal/bitemporal.  No blurry vision double vision numbness weakness in any extremity and no lightheadedness or dizziness.  No syncope or near syncope.    She has a history of bladder suspension, cystocele repair, vaginal hysterectomy        EMR reviewed including pt PMHx, past surgical history and past visits to ER.     See HPI for more details      Lab Tests:      I personally reviewed all laboratory work and imaging. Metabolic panel without any acute abnormality specifically kidney function within normal limits and no significant electrolyte abnormalities. CBC without leukocytosis or significant anemia.      Imaging Studies:    Abnormal findings. I personally reviewed all imaging studies. Imaging notable for  IMPRESSION:   Focal area of inflammation adjacent to the splenic flexure, most   consistent with omental infarction.         Cardiac Monitoring:    .The patient was maintained on a cardiac monitor.  I personally viewed and interpreted the cardiac monitored which showed an underlying rhythm of: NSR  .EKG non-ischemic      Medicines ordered:    I ordered medication including Benadryl, Reglan,  morphine, LR, Dilaudid, Zofran, Percocet for pain and nausea vomiting  Reevaluation of the patient after these medicines showed that the patient improved  I have reviewed the patients home medicines and have made adjustments as needed      Critical Interventions:    .      Consults/Attending Physician      .I discussed this case with my attending physician who cosigned this note including patient's presenting symptoms, physical exam, and planned diagnostics and interventions. Attending physician stated agreement with plan or made changes to plan which were implemented.      Reevaluation:    After the interventions noted above I re-evaluated patient and found that they have :improved      Social Determinants of Health:    .        Problem List / ED Course:    Evidence of omental infarction which is consistent with the area of patient's pain I do appreciate an area of inflammation left lower quadrant with stranding.  No bowel obstruction or other acute findings.  I considered admission specifically for pain control.  Workup otherwise reassuring.  PT/INR normal.  I recommended close PCP follow-up.  Pain control with Percocet and Zofran ultimately recommend Tylenol ibuprofen primarily for pain  but she was sent home with Percocet for breakthrough pain.  She is tolerating p.o., ambulatory and feels much improved.  Will discharge home return precautions discussed.      Dispostion:    After consideration of the diagnostic results and the patients response to treatment, I feel that the patent would benefit from discharge home.    Final Clinical Impression(s) / ED Diagnoses  Final diagnoses:   Omental infarction (HCC)   Left lower quadrant abdominal pain       Rx / [place] Orders  ED Discharge Orders            Ordered     ondansetron (ZOFRAN-ODT) 4 MG disintegrating tablet  Every 8 hours PRN         09/28/22 0442     oxyCODONE-acetaminophen (PERCOCET/ROXICET) 5-325 MG tablet  Every 6 hours PRN         09/28/22 0442                        Gailen Shelter, PA  09/30/22 0319       Dione Booze, MD  09/30/22 [phone]

## 2022-09-28 NOTE — ED Provider Notes (Signed)
[place] EMERGENCY DEPARTMENT AT [facility] Provider Note   CSN: [id number] Arrival date & time: 09/27/22  2204     History {Add pertinent medical, surgical, social history, OB history to HPI:1} Chief Complaint  Patient presents with   Abdominal Pain    Black stool 12h ago    April Gonzalez is a 62 y.o. female.   Abdominal Pain  Patient is a 62 year old female with past medical history significant for fibroids, HTN, allergies, anemia, HLD, seizures as an infant has not had any seizures since send not on any AEDs  She is present emergency room today with complaints of 10/10 left lower quadrant abdominal pain that began approximately 4 days ago has been constant since.  No urinary frequency urgency dysuria or hematuria.  No chest pain difficulty breathing.  No fevers at home.  No lightheadedness or dizziness.  She has not had any nausea or vomiting or diarrhea.  She states that she also has a significant headache that is frontal/bitemporal.  No blurry vision double vision numbness weakness in any extremity and no lightheadedness or dizziness.  No syncope or near syncope.  She has a history of bladder suspension, cystocele repair, vaginal hysterectomy     Home Medications Prior to Admission medications   Medication Sig Start Date End Date Taking? Authorizing Provider  ondansetron (ZOFRAN-ODT) 4 MG disintegrating tablet Take 1 tablet (4 mg total) by mouth every 8 (eight) hours as needed for nausea or vomiting. 09/28/22  Yes Quinita Kostelecky S, PA  oxyCODONE-acetaminophen (PERCOCET/ROXICET) 5-325 MG tablet Take 1 tablet by mouth every 6 (six) hours as needed for severe pain. 09/28/22  Yes Quentin Strebel, Stevphen Meuse S, PA  acetaminophen (TYLENOL) 500 MG tablet Take 500-1,000 mg by mouth every 6 (six) hours as needed for mild pain or headache.    [provider]  amLODipine (NORVASC) 10 MG tablet TAKE 1 TABLET BY MOUTH DAILY 07/09/22   Nafziger, Kandee Keen, NP  azelastine (ASTELIN) 0.1 %  nasal spray Place 2 sprays into both nostrils 2 (two) times daily. Use in each nostril as directed 04/11/22   Nafziger, Kandee Keen, NP  buPROPion (WELLBUTRIN XL) 150 MG 24 hr tablet Take 1 tablet (150 mg total) by mouth daily. 07/10/22   Nafziger, Kandee Keen, NP  clobetasol cream (TEMOVATE) 0.05 % Apply 1 Application topically 2 (two) times daily. 01/30/22   Nafziger, Kandee Keen, NP  EPINEPHrine (EPIPEN 2-PAK) 0.3 mg/0.3 mL IJ SOAJ injection Inject 0.3 mLs (0.3 mg total) into the muscle once for 1 dose. If you develop difficulty breathing 12/07/19 07/10/22  Nafziger, Kandee Keen, NP  methocarbamol (ROBAXIN) 500 MG tablet Take 1 tablet (500 mg total) by mouth every 8 (eight) hours as needed for muscle spasms. 08/07/22   Benjiman Core, MD  pantoprazole (PROTONIX) 40 MG tablet Take 1 tablet (40 mg total) by mouth daily. 07/25/22   Nafziger, Kandee Keen, NP  silver sulfADIAZINE (SILVADENE) 1 % cream Apply 1 Application topically daily. 01/29/22   Nafziger, Kandee Keen, NP  sucralfate (CARAFATE) 1 GM/10ML suspension Take 10 mLs (1 g total) by mouth 4 (four) times daily -  with meals and at bedtime. 07/25/22   Nafziger, Kandee Keen, NP      Allergies    Methimazole, Nitroglycerin, Other, Tetracycline hcl, Lisinopril, Promethazine, Shrimp [shellfish allergy], Latex, Penicillins, Tape, and Terbutaline    Review of Systems   Review of Systems  Gastrointestinal:  Positive for abdominal pain.    Physical Exam Updated Vital Signs BP (!) 140/88   Pulse 73   Temp  98 F (36.7 C)   Resp 12   Ht 5\' 3"  (1.6 m)   Wt 83.9 kg   LMP 12/03/2012   SpO2 98%   BMI 32.77 kg/m  Physical Exam Vitals and nursing note reviewed.  Constitutional:      General: She is not in acute distress.    Comments: Uncomfortable 62 year old female in no acute distress.  Uncomfortable but able to answer questions appropriately follow commands  HENT:     Head: Normocephalic and atraumatic.     Nose: Nose normal.  Eyes:     General: No scleral icterus. Cardiovascular:     Rate  and Rhythm: Normal rate and regular rhythm.     Pulses: Normal pulses.     Heart sounds: Normal heart sounds.  Pulmonary:     Effort: Pulmonary effort is normal. No respiratory distress.     Breath sounds: Normal breath sounds. No wheezing.  Abdominal:     Palpations: Abdomen is soft.     Tenderness: There is abdominal tenderness.     Comments: Left lower quadrant focal abdominal tenderness.  There is some voluntary guarding.  No rigidity.  Abdomen is soft and there is no bruising.  No other significant abdominal tenderness  Musculoskeletal:     Cervical back: Normal range of motion.     Right lower leg: No edema.     Left lower leg: No edema.  Skin:    General: Skin is warm and dry.     Capillary Refill: Capillary refill takes less than 2 seconds.  Neurological:     Mental Status: She is alert. Mental status is at baseline.  Psychiatric:        Mood and Affect: Mood normal.        Behavior: Behavior normal.     ED Results / Procedures / Treatments   Labs (all labs ordered are listed, but only abnormal results are displayed) Labs Reviewed  COMPREHENSIVE METABOLIC PANEL - Abnormal; Notable for the following components:      Result Value   Glucose, Bld 125 (*)    AST 13 (*)    All other components within normal limits  I-STAT CHEM 8, ED - Abnormal; Notable for the following components:   Glucose, Bld 121 (*)    All other components within normal limits  LIPASE, BLOOD  CBC WITH DIFFERENTIAL/PLATELET  URINALYSIS, W/ REFLEX TO CULTURE (INFECTION SUSPECTED)  PROTIME-INR    EKG EKG Interpretation  Date/Time:  Friday Sep 27 2022 23:32:25 EDT Ventricular Rate:  93 PR Interval:  164 QRS Duration: 72 QT Interval:  354 QTC Calculation: 440 R Axis:   25 Text Interpretation: Normal sinus rhythm Right atrial enlargement  similar to April 2024 Confirmed by [name] [phone]) on 09/27/2022 11:38:35 PM  Radiology CT ABDOMEN PELVIS W CONTRAST  Result Date:  09/28/2022 CLINICAL DATA:  Left lower quadrant abdominal pain EXAM: CT ABDOMEN AND PELVIS WITH CONTRAST TECHNIQUE: Multidetector CT imaging of the abdomen and pelvis was performed using the standard protocol following bolus administration of intravenous contrast. RADIATION DOSE REDUCTION: This exam was performed according to the departmental dose-optimization program which includes automated exposure control, adjustment of the mA and/or kV according to patient size and/or use of iterative reconstruction technique. CONTRAST:  85mL OMNIPAQUE IOHEXOL 300 MG/ML  SOLN COMPARISON:  None Available. FINDINGS: Lower Chest: Normal. Hepatobiliary: Normal hepatic contours. No intra- or extrahepatic biliary dilatation. Normal gallbladder. Pancreas: Normal pancreas. No ductal dilatation or peripancreatic fluid collection. Spleen: Normal. Adrenals/Urinary Tract:  The adrenal glands are normal. No hydronephrosis, nephroureterolithiasis or solid renal mass. The urinary bladder is normal for degree of distention Stomach/Bowel: There is no hiatal hernia. Normal duodenal course and caliber. No small bowel dilatation or inflammation. There is a focal area of inflammation adjacent to the splenic flexure, most consistent with omental infarction. Normal appendix. Vascular/Lymphatic: Normal course and caliber of the major abdominal vessels. No abdominal or pelvic lymphadenopathy. Reproductive: Status post hysterectomy. No adnexal mass. Other: None. Musculoskeletal: No bony spinal canal stenosis or focal osseous abnormality. IMPRESSION: Focal area of inflammation adjacent to the splenic flexure, most consistent with omental infarction. Electronically Signed   By: Deatra Robinson M.D.   On: 09/28/2022 01:39    Procedures Procedures  {Document cardiac monitor, telemetry assessment procedure when appropriate:1}  Medications Ordered in ED Medications  HYDROcodone-acetaminophen (NORCO/VICODIN) 5-325 MG per tablet 1 tablet (1 tablet Oral  Given 09/27/22 2305)  iohexol (OMNIPAQUE) 300 MG/ML solution 85 mL (85 mLs Intravenous Contrast Given 09/28/22 0117)  lactated ringers bolus 1,000 mL (0 mLs Intravenous Stopped 09/28/22 0425)  HYDROmorphone (DILAUDID) injection 0.5 mg (0.5 mg Intravenous Given 09/28/22 0217)  ondansetron (ZOFRAN) injection 4 mg (4 mg Intravenous Given 09/28/22 0217)  metoCLOPramide (REGLAN) injection 10 mg (10 mg Intravenous Given 09/28/22 0454)  diphenhydrAMINE (BENADRYL) capsule 25 mg (25 mg Oral Given 09/28/22 0455)  morphine (PF) 4 MG/ML injection 4 mg (4 mg Intravenous Given 09/28/22 0455)    ED Course/ Medical Decision Making/ A&P   {   Click here for ABCD2, HEART and other calculatorsREFRESH Note before signing :1}                          Medical Decision Making Amount and/or Complexity of Data Reviewed Labs: ordered.  Risk Prescription drug management.   This patient presents to the ED for concern of abd pain, this involves a number of treatment options, and is a complaint that carries with it a moderate to high risk of complications and morbidity. A differential diagnosis was considered for the patient's symptoms which is discussed below:   The causes of generalized abdominal pain include but are not limited to AAA, mesenteric ischemia, appendicitis, diverticulitis, DKA, gastritis, gastroenteritis, AMI, nephrolithiasis, pancreatitis, peritonitis, adrenal insufficiency,lead poisoning, iron toxicity, intestinal ischemia, constipation, UTI,SBO/LBO, splenic rupture, biliary disease, IBD, IBS, PUD, or hepatitis. Ectopic pregnancy, ovarian torsion, PID.    Co morbidities: Discussed in HPI   Brief History:  Patient is a 62 year old female with past medical history significant for fibroids, HTN, allergies, anemia, HLD, seizures as an infant has not had any seizures since send not on any AEDs  She is present emergency room today with complaints of 10/10 left lower quadrant abdominal pain that began  approximately 4 days ago has been constant since.  No urinary frequency urgency dysuria or hematuria.  No chest pain difficulty breathing.  No fevers at home.  No lightheadedness or dizziness.  She has not had any nausea or vomiting or diarrhea.  She states that she also has a significant headache that is frontal/bitemporal.  No blurry vision double vision numbness weakness in any extremity and no lightheadedness or dizziness.  No syncope or near syncope.  She has a history of bladder suspension, cystocele repair, vaginal hysterectomy    EMR reviewed including pt PMHx, past surgical history and past visits to ER.   See HPI for more details   Lab Tests:   I personally reviewed all laboratory work and  imaging. Metabolic panel without any acute abnormality specifically kidney function within normal limits and no significant electrolyte abnormalities. CBC without leukocytosis or significant anemia.   Imaging Studies:  {Blank single:19197::"NAD. I personally reviewed all imaging studies and no acute abnormality found. I agree with radiology interpretation.","Abnormal findings. I personally reviewed all imaging studies. Imaging notable for","No imaging studies ordered for this patient"}    Cardiac Monitoring:  {Blank single:19197::"The patient was maintained on a cardiac monitor.  I personally viewed and interpreted the cardiac monitored which showed an underlying rhythm of:","NA"} {Blank single:19197::"EKG non-ischemic","NA"}   Medicines ordered:  I ordered medication including ***  for *** Reevaluation of the patient after these medicines showed that the patient {resolved/improved/worsened:23923::"improved"} I have reviewed the patients home medicines and have made adjustments as needed   Critical Interventions:  ***   Consults/Attending Physician   {Blank single:19197::"I requested consultation with ***,  and discussed lab and imaging findings as well as pertinent plan - they  recommend: ***","I discussed this case with my attending physician who cosigned this note including patient's presenting symptoms, physical exam, and planned diagnostics and interventions. Attending physician stated agreement with plan or made changes to plan which were implemented."}   Reevaluation:  After the interventions noted above I re-evaluated patient and found that they have :{resolved/improved/worsened:23923::"improved"}   Social Determinants of Health:  {Blank single:19197::"Given cab voucher","Social work/case management involved","The patient's social determinants of health were a factor in the care of this patient"}    Problem List / ED Course:  ***   Dispostion:  After consideration of the diagnostic results and the patients response to treatment, I feel that the patent would benefit from ***      Final Clinical Impression(s) / ED Diagnoses Final diagnoses:  Omental infarction (HCC)  Left lower quadrant abdominal pain    Rx / DC Orders ED Discharge Orders          Ordered    ondansetron (ZOFRAN-ODT) 4 MG disintegrating tablet  Every 8 hours PRN        09/28/22 0442    oxyCODONE-acetaminophen (PERCOCET/ROXICET) 5-325 MG tablet  Every 6 hours PRN        09/28/22 0442

## 2022-09-28 NOTE — Discharge Instructions (Signed)
I would like you to closely follow-up with your primary care provider.  You may always return to emergency room for any new or concerning symptoms.  Make sure you are drinking plenty of water, take Tylenol and ibuprofen as discussed below.  Zofran for nausea  Please use Tylenol or ibuprofen for pain.  You may use 600 mg ibuprofen every 6 hours or 1000 mg of Tylenol every 6 hours.  You may choose to alternate between the 2.  This would be most effective.  Not to exceed 4 g of Tylenol within 24 hours.  Not to exceed 3200 mg ibuprofen 24 hours.

## 2022-10-01 ENCOUNTER — Encounter: Payer: Self-pay | Admitting: Adult Health

## 2022-10-01 ENCOUNTER — Telehealth (INDEPENDENT_AMBULATORY_CARE_PROVIDER_SITE_OTHER): Admitting: Adult Health

## 2022-10-01 ENCOUNTER — Telehealth: Payer: Self-pay | Admitting: Adult Health

## 2022-10-01 VITALS — Ht 63.0 in | Wt 185.0 lb

## 2022-10-01 DIAGNOSIS — I10 Essential (primary) hypertension: Secondary | ICD-10-CM | POA: Diagnosis not present

## 2022-10-01 NOTE — Progress Notes (Deleted)
   Subjective:    Patient ID: April Gonzalez, female    DOB: [birth year], 62 y.o.   MRN: [id number]  HPI    Review of Systems     Objective:   Physical Exam        Assessment & Plan:

## 2022-10-01 NOTE — Progress Notes (Signed)
Virtual Visit via Video Note  I connected with April Gonzalez on 10/01/22 at  5:15 PM EDT by a video enabled telemedicine application and verified that I am speaking with the correct person using two identifiers.  Location patient: home Location provider:work or home office Persons participating in the virtual visit: patient, provider  I discussed the limitations of evaluation and management by telemedicine and the availability of in person appointments. The patient expressed understanding and agreed to proceed.   HPI: 62 year old female who is being evaluated today for concern of elevated blood pressure reading.  She is currently managed with Norvasc 10 mg daily.  She denies dizziness, lightheadedness, chest pain, or shortness of breath.  He was checking her blood pressure at home periodically reports that her blood pressures were within normal range.  She was seen in the emergency room 4 days ago with complaints of 10 out of 10 left lower quadrant abdominal pain that began 4 days prior. Her CT scan showed  Focal area of inflammation adjacent to the splenic flexure, most consistent with omental infarction.  The ER her blood pressure was slightly elevated at 140/88.  She is feeling better today has much less severe abdominal pain but has not checked her blood pressure since being discharged from the hospital.  He is wondering if we need to change her blood pressure medication  BP Readings from Last 3 Encounters:  09/28/22 (!) 140/88  08/07/22 134/83  07/25/22 (!) 148/82      ROS: See pertinent positives and negatives per HPI.  Past Medical History:  Diagnosis Date   Allergy    Anemia 05/2012   transfusion   Fibroids    H/O myomectomy   GERD (gastroesophageal reflux disease)    rare    H/O uterine prolapse    History of blood transfusion    x 3    Hyperlipidemia    Hypertension    Rectal bleeding    NOTED AT pv 01-09-17- PT STATES with most BM's for the last 2 years, also hurts     Seizures (HCC)    last seizure age 56 or 10 per pt report-01-09-17 no change in this   Thyroid disorder     Past Surgical History:  Procedure Laterality Date   ANKLE SURGERY     One plate, seven screws in the right ankle    BLADDER SUSPENSION N/A 12/10/2012   Procedure: TRANSVAGINAL TAPE (TVT) PROCEDURE;  Surgeon: Purcell Nails, MD;  Location: [facility];  Service: Gynecology;  Laterality: N/A;   CYSTOCELE REPAIR N/A 12/10/2012   Procedure: ANTERIOR REPAIR (CYSTOCELE);  Surgeon: Hal Morales, MD;  Location: [facility];  Service: Gynecology;  Laterality: N/A;   CYSTOSCOPY N/A 12/10/2012   Procedure: CYSTOSCOPY;  Surgeon: Purcell Nails, MD;  Location: [facility];  Service: Gynecology;  Laterality: N/A;   EXCISION VAGINAL CYST     MYOMECTOMY  51YRS AGO   HYSTEROSCOPIC   TUBAL LIGATION  10/10/1991   VAGINAL HYSTERECTOMY N/A 12/10/2012   Procedure: Total Vaginal Hysterectomy;  Surgeon: Hal Morales, MD;  Location: [facility];  Service: Gynecology;  Laterality: N/A;    Family History  Problem Relation Age of Onset   Heart failure Father    Diabetes Mother    High blood pressure Mother    Arthritis Mother    Breast cancer Maternal Aunt        in her 75's    Breast cancer Cousin    Thyroid disease Sister  Colon cancer Neg Hx    Colon polyps Neg Hx    Esophageal cancer Neg Hx    Rectal cancer Neg Hx    Stomach cancer Neg Hx        Current Outpatient Medications:    acetaminophen (TYLENOL) 500 MG tablet, Take 500-1,000 mg by mouth every 6 (six) hours as needed for mild pain or headache., Disp: , Rfl:    amLODipine (NORVASC) 10 MG tablet, TAKE 1 TABLET BY MOUTH DAILY, Disp: 90 tablet, Rfl: 0   azelastine (ASTELIN) 0.1 % nasal spray, Place 2 sprays into both nostrils 2 (two) times daily. Use in each nostril as directed, Disp: 30 mL, Rfl: 6   buPROPion (WELLBUTRIN XL) 150 MG 24 hr tablet, Take 1 tablet (150 mg total) by mouth daily., Disp: 90 tablet, Rfl: 0   clobetasol cream (TEMOVATE)  0.05 %, Apply 1 Application topically 2 (two) times daily., Disp: 30 g, Rfl: 1   methocarbamol (ROBAXIN) 500 MG tablet, Take 1 tablet (500 mg total) by mouth every 8 (eight) hours as needed for muscle spasms., Disp: 8 tablet, Rfl: 0   ondansetron (ZOFRAN-ODT) 4 MG disintegrating tablet, Take 1 tablet (4 mg total) by mouth every 8 (eight) hours as needed for nausea or vomiting., Disp: 20 tablet, Rfl: 0   oxyCODONE-acetaminophen (PERCOCET/ROXICET) 5-325 MG tablet, Take 1 tablet by mouth every 6 (six) hours as needed for severe pain., Disp: 6 tablet, Rfl: 0   pantoprazole (PROTONIX) 40 MG tablet, Take 1 tablet (40 mg total) by mouth daily., Disp: 30 tablet, Rfl: 3   silver sulfADIAZINE (SILVADENE) 1 % cream, Apply 1 Application topically daily., Disp: 50 g, Rfl: 0   sucralfate (CARAFATE) 1 GM/10ML suspension, Take 10 mLs (1 g total) by mouth 4 (four) times daily -  with meals and at bedtime., Disp: 420 mL, Rfl: 0   EPINEPHrine (EPIPEN 2-PAK) 0.3 mg/0.3 mL IJ SOAJ injection, Inject 0.3 mLs (0.3 mg total) into the muscle once for 1 dose. If you develop difficulty breathing, Disp: 0.3 mL, Rfl: 0  EXAM:  VITALS per patient if applicable:  GENERAL: alert, oriented, appears well and in no acute distress  HEENT: atraumatic, conjunttiva clear, no obvious abnormalities on inspection of external nose and ears  NECK: normal movements of the head and neck  LUNGS: on inspection no signs of respiratory distress, breathing rate appears normal, no obvious gross SOB, gasping or wheezing  CV: no obvious cyanosis  MS: moves all visible extremities without noticeable abnormality  PSYCH/NEURO: pleasant and cooperative, no obvious depression or anxiety, speech and thought processing grossly intact  ASSESSMENT AND PLAN:  Discussed the following assessment and plan:  1. Essential hypertension - Will have her check her BP at home and follow up send me results via mychart before chaning medications   [name], NP       I discussed the assessment and treatment plan with the patient. The patient was provided an opportunity to ask questions and all were answered. The patient agreed with the plan and demonstrated an understanding of the instructions.   The patient was advised to call back or seek an in-person evaluation if the symptoms worsen or if the condition fails to improve as anticipated.   [name], NP

## 2022-10-01 NOTE — Telephone Encounter (Addendum)
Prescription Request  10/01/2022  LOV: 07/25/2022  What is the name of the medication or equipment? amLODipine (NORVASC) 10 MG tablet   Pt states her BP is still running very high and is wondering if she needs to try something else?  Pt would like a call back from NP, and not the CMA. (Respectfully)  Pt was scheduled for a VV today at 5:15 pm, to discuss medication problem.  Pt was informed that she cannot discuss her recent visit to the ED on 09/27/22 for abdominal pain, or she will have to reschedule for an in office visit.   Pt stated she understood.  Have you contacted your pharmacy to request a refill? No   Which pharmacy would you like this sent to?  [facility] [id number] [place], [place] - [address] [facility] [address] [place] [address] Phone: [phone] Fax: [phone]    Patient notified that their request is being sent to the clinical staff for review and that they should receive a response within 2 business days.   Please advise at Mobile [phone] (mobile)

## 2022-10-04 ENCOUNTER — Other Ambulatory Visit: Payer: Self-pay | Admitting: Adult Health

## 2022-10-04 DIAGNOSIS — F419 Anxiety disorder, unspecified: Secondary | ICD-10-CM

## 2022-10-10 ENCOUNTER — Ambulatory Visit: Admitting: Gastroenterology

## 2022-10-30 ENCOUNTER — Telehealth (INDEPENDENT_AMBULATORY_CARE_PROVIDER_SITE_OTHER): Admitting: Adult Health

## 2022-10-30 ENCOUNTER — Encounter: Payer: Self-pay | Admitting: Adult Health

## 2022-10-30 VITALS — Ht 63.0 in | Wt 185.0 lb

## 2022-10-30 DIAGNOSIS — I10 Essential (primary) hypertension: Secondary | ICD-10-CM | POA: Diagnosis not present

## 2022-10-30 MED ORDER — AMLODIPINE BESYLATE 10 MG PO TABS
10.0000 mg | ORAL_TABLET | Freq: Every day | ORAL | 0 refills | Status: DC
Start: 2022-10-30 — End: 2023-01-28

## 2022-10-30 NOTE — Progress Notes (Signed)
Virtual Visit via Video Note  I connected with April Gonzalez on 10/30/22 at  2:30 PM EDT by a video enabled telemedicine application and verified that I am speaking with the correct person using two identifiers.  Location patient: home Location provider:work or home office Persons participating in the virtual visit: patient, provider  I discussed the limitations of evaluation and management by telemedicine and the availability of in person appointments. The patient expressed understanding and agreed to proceed.   HPI: 62 year old female who  has a past medical history of Allergy, Anemia (05/2012), Fibroids, GERD (gastroesophageal reflux disease), H/O uterine prolapse, History of blood transfusion, Hyperlipidemia, Hypertension, Rectal bleeding, Seizures (HCC), and Thyroid disorder.  She presents to the office today for follow up regarding HTN. She is currently on Norvasc 10 mg daily. She has not been checking her blood pressures at home on a routine basis. She denies headaches, blurred vision, or chest pain.   She is out of her medication and needs a refill   ROS: See pertinent positives and negatives per HPI.  Past Medical History:  Diagnosis Date   Allergy    Anemia 05/2012   transfusion   Fibroids    H/O myomectomy   GERD (gastroesophageal reflux disease)    rare    H/O uterine prolapse    History of blood transfusion    x 3    Hyperlipidemia    Hypertension    Rectal bleeding    NOTED AT pv 01-09-17- PT STATES with most BM's for the last 2 years, also hurts    Seizures (HCC)    last seizure age 57 or 10 per pt report-01-09-17 no change in this   Thyroid disorder     Past Surgical History:  Procedure Laterality Date   ANKLE SURGERY     One plate, seven screws in the right ankle    BLADDER SUSPENSION N/A 12/10/2012   Procedure: TRANSVAGINAL TAPE (TVT) PROCEDURE;  Surgeon: Purcell Nails, MD;  Location: [facility];  Service: Gynecology;  Laterality: N/A;   CYSTOCELE REPAIR N/A  12/10/2012   Procedure: ANTERIOR REPAIR (CYSTOCELE);  Surgeon: Hal Morales, MD;  Location: [facility];  Service: Gynecology;  Laterality: N/A;   CYSTOSCOPY N/A 12/10/2012   Procedure: CYSTOSCOPY;  Surgeon: Purcell Nails, MD;  Location: [facility];  Service: Gynecology;  Laterality: N/A;   EXCISION VAGINAL CYST     MYOMECTOMY  33YRS AGO   HYSTEROSCOPIC   TUBAL LIGATION  10/10/1991   VAGINAL HYSTERECTOMY N/A 12/10/2012   Procedure: Total Vaginal Hysterectomy;  Surgeon: Hal Morales, MD;  Location: [facility];  Service: Gynecology;  Laterality: N/A;    Family History  Problem Relation Age of Onset   Heart failure Father    Diabetes Mother    High blood pressure Mother    Arthritis Mother    Breast cancer Maternal Aunt        in her 8's    Breast cancer Cousin    Thyroid disease Sister    Colon cancer Neg Hx    Colon polyps Neg Hx    Esophageal cancer Neg Hx    Rectal cancer Neg Hx    Stomach cancer Neg Hx        Current Outpatient Medications:    acetaminophen (TYLENOL) 500 MG tablet, Take 500-1,000 mg by mouth every 6 (six) hours as needed for mild pain or headache., Disp: , Rfl:    amLODipine (NORVASC) 10 MG tablet, TAKE 1 TABLET  BY MOUTH DAILY, Disp: 90 tablet, Rfl: 0   azelastine (ASTELIN) 0.1 % nasal spray, Place 2 sprays into both nostrils 2 (two) times daily. Use in each nostril as directed, Disp: 30 mL, Rfl: 6   buPROPion (WELLBUTRIN XL) 150 MG 24 hr tablet, TAKE 1 TABLET BY MOUTH DAILY, Disp: 90 tablet, Rfl: 0   clobetasol cream (TEMOVATE) 0.05 %, Apply 1 Application topically 2 (two) times daily., Disp: 30 g, Rfl: 1   methocarbamol (ROBAXIN) 500 MG tablet, Take 1 tablet (500 mg total) by mouth every 8 (eight) hours as needed for muscle spasms., Disp: 8 tablet, Rfl: 0   ondansetron (ZOFRAN-ODT) 4 MG disintegrating tablet, Take 1 tablet (4 mg total) by mouth every 8 (eight) hours as needed for nausea or vomiting., Disp: 20 tablet, Rfl: 0   oxyCODONE-acetaminophen  (PERCOCET/ROXICET) 5-325 MG tablet, Take 1 tablet by mouth every 6 (six) hours as needed for severe pain., Disp: 6 tablet, Rfl: 0   pantoprazole (PROTONIX) 40 MG tablet, Take 1 tablet (40 mg total) by mouth daily., Disp: 30 tablet, Rfl: 3   silver sulfADIAZINE (SILVADENE) 1 % cream, Apply 1 Application topically daily., Disp: 50 g, Rfl: 0   sucralfate (CARAFATE) 1 GM/10ML suspension, Take 10 mLs (1 g total) by mouth 4 (four) times daily -  with meals and at bedtime., Disp: 420 mL, Rfl: 0   EPINEPHrine (EPIPEN 2-PAK) 0.3 mg/0.3 mL IJ SOAJ injection, Inject 0.3 mLs (0.3 mg total) into the muscle once for 1 dose. If you develop difficulty breathing, Disp: 0.3 mL, Rfl: 0  EXAM:  VITALS per patient if applicable:  GENERAL: alert, oriented, appears well and in no acute distress  HEENT: atraumatic, conjunttiva clear, no obvious abnormalities on inspection of external nose and ears  NECK: normal movements of the head and neck  LUNGS: on inspection no signs of respiratory distress, breathing rate appears normal, no obvious gross SOB, gasping or wheezing  CV: no obvious cyanosis  MS: moves all visible extremities without noticeable abnormality  PSYCH/NEURO: pleasant and cooperative, no obvious depression or anxiety, speech and thought processing grossly intact  ASSESSMENT AND PLAN:  Discussed the following assessment and plan:   1. Essential hypertension - encouraged to check BP at home and send me results via mychart  - amLODipine (NORVASC) 10 MG tablet; Take 1 tablet (10 mg total) by mouth daily.  Dispense: 90 tablet; Refill: 0     I discussed the assessment and treatment plan with the patient. The patient was provided an opportunity to ask questions and all were answered. The patient agreed with the plan and demonstrated an understanding of the instructions.   The patient was advised to call back or seek an in-person evaluation if the symptoms worsen or if the condition fails to improve  as anticipated.   [name], NP

## 2022-10-30 NOTE — Progress Notes (Deleted)
   Subjective:    Patient ID: April Gonzalez, female    DOB: 09/22/1960, 62 y.o.   MRN: [id number]  HPI    Review of Systems     Objective:   Physical Exam        Assessment & Plan:    

## 2022-11-01 ENCOUNTER — Ambulatory Visit (INDEPENDENT_AMBULATORY_CARE_PROVIDER_SITE_OTHER): Admitting: Family Medicine

## 2022-11-01 ENCOUNTER — Encounter (INDEPENDENT_AMBULATORY_CARE_PROVIDER_SITE_OTHER): Payer: Self-pay | Admitting: Family Medicine

## 2022-11-01 VITALS — BP 114/77 | HR 90 | Temp 97.9°F | Ht 63.39 in | Wt 187.0 lb

## 2022-11-01 DIAGNOSIS — E039 Hypothyroidism, unspecified: Secondary | ICD-10-CM

## 2022-11-01 DIAGNOSIS — Z8781 Personal history of (healed) traumatic fracture: Secondary | ICD-10-CM

## 2022-11-01 DIAGNOSIS — K55069 Acute infarction of intestine, part and extent unspecified: Secondary | ICD-10-CM

## 2022-11-01 DIAGNOSIS — R7303 Prediabetes: Secondary | ICD-10-CM

## 2022-11-01 DIAGNOSIS — Z1231 Encounter for screening mammogram for malignant neoplasm of breast: Secondary | ICD-10-CM

## 2022-11-01 DIAGNOSIS — Z1211 Encounter for screening for malignant neoplasm of colon: Secondary | ICD-10-CM

## 2022-11-01 DIAGNOSIS — E785 Hyperlipidemia, unspecified: Secondary | ICD-10-CM

## 2022-11-01 DIAGNOSIS — Z8249 Family history of ischemic heart disease and other diseases of the circulatory system: Secondary | ICD-10-CM

## 2022-11-01 DIAGNOSIS — I7121 Aneurysm of the ascending aorta, without rupture: Secondary | ICD-10-CM

## 2022-11-01 DIAGNOSIS — J0101 Acute recurrent maxillary sinusitis: Secondary | ICD-10-CM

## 2022-11-01 DIAGNOSIS — I700 Atherosclerosis of aorta: Secondary | ICD-10-CM

## 2022-11-01 MED ORDER — FLUTICASONE PROPIONATE 50 MCG/ACT NA SUSP
2.0000 | Freq: Every day | NASAL | 2 refills | Status: AC
Start: 2022-11-01 — End: ?

## 2022-11-01 MED ORDER — LORATADINE 10 MG PO TABS
10.0000 mg | ORAL_TABLET | Freq: Every day | ORAL | 2 refills | Status: AC
Start: 2022-11-01 — End: ?

## 2022-11-01 NOTE — Progress Notes (Signed)
[facility] VISIT           Jody [name]  is a 62 y.o.  female.  HPI  62 year old female here to establish care.  She lives with her daughter  currently and helps to take care of her grandchildren.  Patient primarily lives in [place].  She is going through a divorce and has high stress.    Patient has right ankle pain for 6 months.  She has history of right ankle fracture with plates/screws after falling down the stairs.  This was approximately 7-8 years ago.  She states in the past few months, she has noticed pain with walking.  She was told in [place] she may need the plates/screws removed.  She has sinus pressure.  She denies fevers.  No sick contacts.  She has recurrent symptoms  She has not tried otc medications.    Patient has abdominal pains.  She states it is all over her abdomen.  No n/v/diarrhea.  No fevers.  She has had symptoms for over 4 weeks.      Review of Systems   Constitutional:  Negative for chills and fever.   HENT:  Positive for sinus pressure.    Respiratory:  Negative for shortness of breath.    Cardiovascular:  Negative for chest pain.   Gastrointestinal:  Negative for abdominal pain, diarrhea, nausea and vomiting.   Musculoskeletal:  Positive for arthralgias.   Psychiatric/Behavioral:  Negative for agitation and behavioral problems.         BP 114/77 (BP Site: Right arm, Patient Position: Sitting, Cuff Size: Medium)   Pulse 90   Temp 97.9 F (36.6 C) (Oral)   Ht 1.61 m (5' 3.39")   Wt 84.8 kg (187 lb)   LMP  (LMP Unknown)   BMI 32.72 kg/m    Physical Exam  Constitutional:       Appearance: Normal appearance.   HENT:      Nose: No congestion.      Mouth/Throat:      Mouth: Mucous membranes are moist.      Pharynx: No posterior oropharyngeal erythema.   Cardiovascular:      Rate and Rhythm: Normal rate and regular rhythm.   Pulmonary:      Effort: Pulmonary effort is normal.      Breath sounds: Normal breath sounds.   Neurological:      Mental Status: She is alert.   Psychiatric:          Mood and Affect: Mood normal.         Behavior: Behavior normal.     TTP over bilateral maxillary sinus     1. Omental infarction  - Referral to General Surgery ([place]); Future  Seen on CT scan in May  Will refer to surgery with persistent pain    2. History of ankle fracture  - Referral to Orthopedic Surgery ([place]); Future    3. Aortic atherosclerosis  - Referral to Cardiology ([place]); Future  Seen on CT calcium   Will refer    4. Aneurysm of ascending aorta without rupture  - Referral to Cardiology ([place]); Future    5. Family history of early CAD  - Referral to Cardiology (); Future  Will refer    6. Hypothyroidism, unspecified type  Will check labs  No on meds currently    7. Prediabetes  - Hemoglobin A1C; Future  Dash diet advised  Daily exercise as tolerated    8. Hyperlipidemia, unspecified  hyperlipidemia type  - CBC with Differential; Future  - Comprehensive Metabolic Panel; Future  - Thyroid Stimulating Hormone (TSH) with Reflex to Free T4; Future  - Lipid Panel; Future  If elevated  Will start statin medication  Dash diet advised  Daily exercise as tolerated    9. Colon cancer screening  - Referral to Gastroenterology (EXTERNAL); Future  Patient is due  Will also refer for intermittent abdominal pains, may need egd  Ct scan done 4 weeks ago    10. Encounter for screening mammogram for malignant neoplasm of breast  - Mammo Screening 3D/[facility] Bilateral; Future    11. Acute recurrent maxillary sinusitis  - loratadine (CLARITIN) 10 MG tablet; Take 1 tablet (10 mg) by mouth daily  Dispense: 30 tablet; Refill: 2  - fluticasone (FLONASE) 50 MCG/ACT nasal spray; 2 sprays by Nasal route daily  Dispense: 16 g; Refill: 2  Will prescribe claritin, flonase      Return in about 3 months (around 02/01/2023) for annual exam.     Patient is in [place] for an unknown amount of time.  If she returns to [place],she was asked to f/u with her PCP

## 2022-11-01 NOTE — Progress Notes (Signed)
Have you seen any specialists/other providers since your last visit with us?    No    Arm preference verified?   Yes, RIGHT ARM    Health Maintenance Due   Topic Date Due    COVID-19 Vaccine (4 - 2023-24 season) 01/04/2022

## 2022-11-05 ENCOUNTER — Encounter (INDEPENDENT_AMBULATORY_CARE_PROVIDER_SITE_OTHER): Payer: Self-pay

## 2022-11-06 ENCOUNTER — Other Ambulatory Visit (FREE_STANDING_LABORATORY_FACILITY)

## 2022-11-06 DIAGNOSIS — R7303 Prediabetes: Secondary | ICD-10-CM

## 2022-11-06 DIAGNOSIS — E785 Hyperlipidemia, unspecified: Secondary | ICD-10-CM

## 2022-11-06 LAB — COMPREHENSIVE METABOLIC PANEL
ALT: 15 U/L (ref 0–55)
AST (SGOT): 12 U/L (ref 5–41)
Albumin/Globulin Ratio: 1.2 (ref 0.9–2.2)
Albumin: 4.1 g/dL (ref 3.5–5.0)
Alkaline Phosphatase: 122 U/L — ABNORMAL HIGH (ref 37–117)
Anion Gap: 9 (ref 5.0–15.0)
BUN: 12 mg/dL (ref 7–21)
Bilirubin, Total: 0.6 mg/dL (ref 0.2–1.2)
CO2: 25 mEq/L (ref 17–29)
Calcium: 9.5 mg/dL (ref 8.5–10.5)
Chloride: 105 mEq/L (ref 99–111)
Creatinine: 0.8 mg/dL (ref 0.4–1.0)
GFR: >60 mL/min/{1.73_m2} (ref >=60.0)
Globulin: 3.4 g/dL (ref 2.0–3.6)
Glucose: 98 mg/dL (ref 70–100)
Hemolysis Index: 6 Index
Potassium: 4.3 mEq/L (ref 3.5–5.3)
Protein, Total: 7.5 g/dL (ref 6.0–8.3)
Sodium: 139 mEq/L (ref 135–145)

## 2022-11-06 LAB — LAB USE ONLY - CBC WITH DIFFERENTIAL
Absolute Basophils: 0.03 10*3/uL (ref 0.00–0.08)
Absolute Eosinophils: 0.16 10*3/uL (ref 0.00–0.44)
Absolute Immature Granulocytes: 0.02 10*3/uL (ref 0.00–0.07)
Absolute Lymphocytes: 1.92 10*3/uL (ref 0.42–3.22)
Absolute Monocytes: 0.54 10*3/uL (ref 0.21–0.85)
Absolute Neutrophils: 4.36 10*3/uL (ref 1.10–6.33)
Absolute nRBC: 0 10*3/uL (ref <=0.00)
Basophils %: 0.4 %
Eosinophils %: 2.3 %
Hematocrit: 42.7 % (ref 34.7–43.7)
Hemoglobin: 14.2 g/dL (ref 11.4–14.8)
Immature Granulocytes %: 0.3 %
Lymphocytes %: 27.3 %
MCH: 28.3 pg (ref 25.1–33.5)
MCHC: 33.3 g/dL (ref 31.5–35.8)
MCV: 85.2 fL (ref 78.0–96.0)
MPV: 12.3 fL (ref 8.9–12.5)
Monocytes %: 7.7 %
Neutrophils %: 62 %
Platelet Count: 215 10*3/uL (ref 142–346)
Preliminary Absolute Neutrophil Count: 4.36 10*3/uL (ref 1.10–6.33)
RBC: 5.01 10*6/uL (ref 3.90–5.10)
RDW: 13 % (ref 11–15)
WBC: 7.03 10*3/uL (ref 3.10–9.50)
nRBC %: 0 /100 WBC (ref <=0.0)

## 2022-11-06 LAB — LIPID PANEL
Cholesterol / HDL Ratio: 6 Index
Cholesterol: 263 mg/dL — ABNORMAL HIGH (ref <=199)
HDL: 44 mg/dL (ref >=40)
LDL Calculated: 190 mg/dL — ABNORMAL HIGH (ref 0–129)
Triglycerides: 145 mg/dL (ref 34–149)
VLDL Calculated: 29 mg/dL (ref 10–40)

## 2022-11-06 LAB — THYROID STIMULATING HORMONE (TSH) WITH REFLEX TO FREE T4: TSH: 1.85 u[IU]/mL (ref 0.35–4.94)

## 2022-11-06 LAB — HEMOGLOBIN A1C
Average Estimated Glucose: 105.4 mg/dL
Hemoglobin A1C: 5.3 % (ref 4.6–5.6)

## 2022-11-07 ENCOUNTER — Other Ambulatory Visit (INDEPENDENT_AMBULATORY_CARE_PROVIDER_SITE_OTHER): Payer: Self-pay | Admitting: Family Medicine

## 2022-11-07 ENCOUNTER — Encounter (INDEPENDENT_AMBULATORY_CARE_PROVIDER_SITE_OTHER): Payer: Self-pay | Admitting: Family Medicine

## 2022-11-07 DIAGNOSIS — E785 Hyperlipidemia, unspecified: Secondary | ICD-10-CM

## 2022-11-07 MED ORDER — ROSUVASTATIN CALCIUM 10 MG PO TABS
10.0000 mg | ORAL_TABLET | Freq: Every evening | ORAL | 1 refills | Status: DC
Start: 2022-11-07 — End: 2022-12-30

## 2022-11-12 ENCOUNTER — Other Ambulatory Visit

## 2022-11-12 ENCOUNTER — Encounter (INDEPENDENT_AMBULATORY_CARE_PROVIDER_SITE_OTHER): Payer: Self-pay

## 2022-11-14 ENCOUNTER — Ambulatory Visit: Admitting: Gastroenterology

## 2022-11-18 ENCOUNTER — Ambulatory Visit: Admitting: Family Medicine

## 2022-11-18 ENCOUNTER — Encounter: Payer: Self-pay | Admitting: Family Medicine

## 2022-11-18 VITALS — BP 142/80 | HR 90 | Temp 98.5°F | Wt 186.0 lb

## 2022-11-18 DIAGNOSIS — R1084 Generalized abdominal pain: Secondary | ICD-10-CM | POA: Diagnosis not present

## 2022-11-18 DIAGNOSIS — K55069 Acute infarction of intestine, part and extent unspecified: Secondary | ICD-10-CM

## 2022-11-18 MED ORDER — TRAMADOL HCL 50 MG PO TABS
50.0000 mg | ORAL_TABLET | Freq: Three times a day (TID) | ORAL | 0 refills | Status: AC | PRN
Start: 2022-11-18 — End: 2022-11-23

## 2022-11-18 NOTE — Progress Notes (Signed)
Established Patient Office Visit   Subjective  Patient ID: April Gonzalez, female    DOB: 03/19/1961  Age: 62 y.o. MRN: [id number]  Chief Complaint  Patient presents with   Medical Management of Chronic Issues    F/U from ED her in [place] and doctors visit in [place]. Pt states she went to ED around 6/4 or 6/5 and was informed she had a blood clot that had exploded in her side. Wanted her to f/u with pcp as they were concerned it may be more clots. Pt was having HA, abdominal pain that went across and under belly button    Patient is a 62 year old female followed by [name], NP and seen for follow-up.  Patient seen in ED in [place] on 09/27/2022 for abdominal pain and diagnosed with omental infarct.  Since then patient also established care in [place] with [facility].  She has several referral pending there including GI, cardiology, general surgery, mammogram.  Patient states pain has been constant since May.  Typically 6-7 out of 10, more sensitive when something touches her abdomen.  Patient states she is in town for a week for a funeral.  Has not taken anything for symptoms.  Denies nausea, vomiting, diarrhea, constipation, CP, SOB.  Also notes having difficulty eating.  If orders a kids meal it may take her a few days to finish up.  Last 2 appts with Kandee Keen were e-visits for HTN.  Patient also mentions ongoing history of edema in right hand over a year and right ankle.  Patient states she is mention this to her PCP.  Patient thinks there is a family history of an autoimmune disorder but cannot recall the name.    Past Medical History:  Diagnosis Date   Allergy    Anemia 05/2012   transfusion   Fibroids    H/O myomectomy   GERD (gastroesophageal reflux disease)    rare    H/O uterine prolapse    History of blood transfusion    x 3    Hyperlipidemia    Hypertension    Rectal bleeding    NOTED AT pv 01-09-17- PT STATES with most BM's for the last 2 years, also hurts    Seizures  (HCC)    last seizure age 42 or 10 per pt report-01-09-17 no change in this   Thyroid disorder    Past Surgical History:  Procedure Laterality Date   ANKLE SURGERY     One plate, seven screws in the right ankle    BLADDER SUSPENSION N/A 12/10/2012   Procedure: TRANSVAGINAL TAPE (TVT) PROCEDURE;  Surgeon: Purcell Nails, MD;  Location: [facility];  Service: Gynecology;  Laterality: N/A;   CYSTOCELE REPAIR N/A 12/10/2012   Procedure: ANTERIOR REPAIR (CYSTOCELE);  Surgeon: Hal Morales, MD;  Location: [facility];  Service: Gynecology;  Laterality: N/A;   CYSTOSCOPY N/A 12/10/2012   Procedure: CYSTOSCOPY;  Surgeon: Purcell Nails, MD;  Location: [facility];  Service: Gynecology;  Laterality: N/A;   EXCISION VAGINAL CYST     MYOMECTOMY  26YRS AGO   HYSTEROSCOPIC   TUBAL LIGATION  10/10/1991   VAGINAL HYSTERECTOMY N/A 12/10/2012   Procedure: Total Vaginal Hysterectomy;  Surgeon: Hal Morales, MD;  Location: [facility];  Service: Gynecology;  Laterality: N/A;   Social History   Tobacco Use   Smoking status: Never   Smokeless tobacco: Never  Substance Use Topics   Alcohol use: Yes    Alcohol/week: 0.0 standard drinks of alcohol  Comment: rare   Drug use: No   Family History  Problem Relation Age of Onset   Heart failure Father    Diabetes Mother    High blood pressure Mother    Arthritis Mother    Breast cancer Maternal Aunt        in her 51's    Breast cancer Cousin    Thyroid disease Sister    Colon cancer Neg Hx    Colon polyps Neg Hx    Esophageal cancer Neg Hx    Rectal cancer Neg Hx    Stomach cancer Neg Hx    Allergies  Allergen Reactions   Methimazole Anaphylaxis    EMS had to be called and a 7-day's stay in the hospital resulted   Nitroglycerin Other (See Comments)    Sweating    Other Itching and Other (See Comments)    Seasonal allergies & dog hair = runny nose also   Tetracycline Hcl Hypertension   Lisinopril Cough   Promethazine Nausea Only    Made the patient feel  like she was going to vomit and it "spaced" her out   Shrimp [Shellfish Allergy] Itching   Latex Rash   Penicillins Other (See Comments)    Has patient had a PCN reaction causing immediate rash, facial/tongue/throat swelling, SOB or lightheadedness with hypotension: Unk Has patient had a PCN reaction causing severe rash involving mucus membranes or skin necrosis: Unk Has patient had a PCN reaction that required hospitalization: Unk Has patient had a PCN reaction occurring within the last 10 years: Yes If all of the above answers are "NO", then may proceed with Cephalosporin use.    Tape Rash   Terbutaline Other (See Comments)    Reaction not recalled (??)      ROS Negative unless stated above    Objective:     BP (!) 142/80 (BP Location: Left Arm, Patient Position: Sitting, Cuff Size: Normal)   Pulse 90   Temp 98.5 F (36.9 C) (Oral)   Wt 186 lb (84.4 kg)   LMP 12/03/2012   SpO2 93%   BMI 32.95 kg/m    Physical Exam Constitutional:      General: She is not in acute distress.    Appearance: Normal appearance.  HENT:     Head: Normocephalic and atraumatic.     Nose: Nose normal.     Mouth/Throat:     Mouth: Mucous membranes are moist.  Cardiovascular:     Rate and Rhythm: Normal rate and regular rhythm.     Heart sounds: Normal heart sounds. No murmur heard.    No gallop.  Pulmonary:     Effort: Pulmonary effort is normal. No respiratory distress.     Breath sounds: Normal breath sounds. No wheezing, rhonchi or rales.  Abdominal:     General: Bowel sounds are normal.     Palpations: Abdomen is soft.     Tenderness: There is abdominal tenderness in the right lower quadrant, left upper quadrant and left lower quadrant.  Skin:    General: Skin is warm and dry.  Neurological:     Mental Status: She is alert and oriented to person, place, and time.      No results found for any visits on 11/18/22.    Assessment & Plan:  Omental infarction (HCC) -      traMADol HCl; Take 1 tablet (50 mg total) by mouth every 8 (eight) hours as needed for up to 5 days.  Dispense: 15  tablet; Refill: 0  Diffuse abdominal pain -     traMADol HCl; Take 1 tablet (50 mg total) by mouth every 8 (eight) hours as needed for up to 5 days.  Dispense: 15 tablet; Refill: 0  Patient with known history of omental infarction causing abdominal pain since May.  Given increased symptoms ED recommended for repeat imaging as appendicitis could be masked by patient's chronic symptoms.  PDMP reviewed.  Tramadol as needed for severe pain.  Given strict precautions.  Advised to keep follow-up appointments [place] of state.  Return if symptoms worsen or fail to improve.   Deeann Saint, MD

## 2022-11-18 NOTE — Patient Instructions (Signed)
Given your increase in pain is recommended that you go to the emergency department for repeat imaging to ensure that your symptoms are not from a new cause such as appendicitis.  A prescription for tramadol was sent to your pharmacy to help with the pain.  You can also use Tylenol or ibuprofen if you do not have issues taking either medications.

## 2022-11-19 ENCOUNTER — Ambulatory Visit (INDEPENDENT_AMBULATORY_CARE_PROVIDER_SITE_OTHER): Admitting: Internal Medicine

## 2022-11-28 ENCOUNTER — Ambulatory Visit: Admitting: Adult Health

## 2022-11-28 ENCOUNTER — Encounter: Payer: Self-pay | Admitting: Adult Health

## 2022-11-28 VITALS — BP 120/80 | HR 75 | Temp 97.9°F | Ht 63.0 in | Wt 188.0 lb

## 2022-11-28 DIAGNOSIS — R1031 Right lower quadrant pain: Secondary | ICD-10-CM | POA: Diagnosis not present

## 2022-11-28 DIAGNOSIS — R1084 Generalized abdominal pain: Secondary | ICD-10-CM | POA: Diagnosis not present

## 2022-11-28 DIAGNOSIS — K55069 Acute infarction of intestine, part and extent unspecified: Secondary | ICD-10-CM

## 2022-11-28 LAB — CBC WITH DIFFERENTIAL/PLATELET
Basophils Absolute: 0 10*3/uL (ref 0.0–0.1)
Basophils Relative: 0.5 % (ref 0.0–3.0)
Eosinophils Absolute: 0.1 10*3/uL (ref 0.0–0.7)
Eosinophils Relative: 1.6 % (ref 0.0–5.0)
HCT: 42.8 % (ref 36.0–46.0)
Hemoglobin: 13.9 g/dL (ref 12.0–15.0)
Lymphocytes Relative: 27.5 % (ref 12.0–46.0)
Lymphs Abs: 2.4 10*3/uL (ref 0.7–4.0)
MCHC: 32.5 g/dL (ref 30.0–36.0)
MCV: 86.9 fl (ref 78.0–100.0)
Monocytes Absolute: 0.7 10*3/uL (ref 0.1–1.0)
Monocytes Relative: 8.2 % (ref 3.0–12.0)
Neutro Abs: 5.3 10*3/uL (ref 1.4–7.7)
Neutrophils Relative %: 62.2 % (ref 43.0–77.0)
Platelets: 229 10*3/uL (ref 150.0–400.0)
RBC: 4.93 Mil/uL (ref 3.87–5.11)
RDW: 13.2 % (ref 11.5–15.5)
WBC: 8.6 10*3/uL (ref 4.0–10.5)

## 2022-11-28 LAB — BASIC METABOLIC PANEL
BUN: 12 mg/dL (ref 6–23)
CO2: 26 mEq/L (ref 19–32)
Calcium: 9.8 mg/dL (ref 8.4–10.5)
Chloride: 104 mEq/L (ref 96–112)
Creatinine, Ser: 0.78 mg/dL (ref 0.40–1.20)
GFR: 82.52 mL/min (ref >60.00)
Glucose, Bld: 89 mg/dL (ref 70–99)
Potassium: 4.3 mEq/L (ref 3.5–5.1)
Sodium: 138 mEq/L (ref 135–145)

## 2022-11-28 MED ORDER — HYDROCODONE-ACETAMINOPHEN 5-325 MG PO TABS
1.0000 | ORAL_TABLET | Freq: Four times a day (QID) | ORAL | 0 refills | Status: AC | PRN
Start: 2022-11-28 — End: ?

## 2022-11-28 NOTE — Progress Notes (Signed)
Subjective:    Patient ID: April Gonzalez, female    DOB: April 03, 1961, 62 y.o.   MRN: [id number]  HPI 62 year old female who  has a past medical history of Allergy, Anemia (05/2012), Fibroids, GERD (gastroesophageal reflux disease), H/O uterine prolapse, History of blood transfusion, Hyperlipidemia, Hypertension, Rectal bleeding, Seizures (HCC), and Thyroid disorder.  She was seen in the ER on 09/27/2022 for abdominal pain and diagnosed with omental infarct.  In the interim she has moved and establish care in [place] with [facility].  It appears as though she has been seen by her primary care physician up there has been referred to gastroenterology, cardiology ( 7/30), and general surgery but has not had these appointments yet.  Since she was seen in the emergency room in May she continues to have pain in her lower abdomen mostly on the right lower side as well as bilateral low back pain.  She denies nausea, vomiting, diarrhea, constipation, chest pain, shortness of breath, or UTI-like symptoms.  Since she has been back in [place] for a funeral she was seen by another provider roughly 10 days ago for the same issue and prescribed tramadol.  She reports that the tramadol did not help ease the pain at all.  At home she has been using Tylenol, Motrin as well without relief.  She will be at home for the next few days until her next appointment with cardiology on 12/03/2022.  She was seen by the other provider in the office due to significant right lower quadrant pain she was advised to go to the emergency room, she did not end up going to the emergency room, instead went home and fell asleep.    Review of Systems See HPI   Past Medical History:  Diagnosis Date   Allergy    Anemia 05/2012   transfusion   Fibroids    H/O myomectomy   GERD (gastroesophageal reflux disease)    rare    H/O uterine prolapse    History of blood transfusion    x 3    Hyperlipidemia    Hypertension     Rectal bleeding    NOTED AT pv 01-09-17- PT STATES with most BM's for the last 2 years, also hurts    Seizures (HCC)    last seizure age 84 or 10 per pt report-01-09-17 no change in this   Thyroid disorder     Social History   Socioeconomic History   Marital status: Married    Spouse name: Not on file   Number of children: 2   Years of education: 15   Highest education level: Not on file  Occupational History   Not on file  Tobacco Use   Smoking status: Never   Smokeless tobacco: Never  Substance and Sexual Activity   Alcohol use: Yes    Alcohol/week: 0.0 standard drinks of alcohol    Comment: rare   Drug use: No   Sexual activity: Yes    Birth control/protection: Surgical    Comment: BTL   Other Topics Concern   Not on file  Social History Narrative   She works for [employer]    Married   Has 2 children    Social Determinants of Corporate investment banker Strain: Not on file  Food Insecurity: Not on file  Transportation Needs: Not on file  Physical Activity: Not on file  Stress: Not on file  Social Connections: Not on file  Intimate Partner Violence: Not  on file    Past Surgical History:  Procedure Laterality Date   ANKLE SURGERY     One plate, seven screws in the right ankle    BLADDER SUSPENSION N/A 12/10/2012   Procedure: TRANSVAGINAL TAPE (TVT) PROCEDURE;  Surgeon: Purcell Nails, MD;  Location: [facility];  Service: Gynecology;  Laterality: N/A;   CYSTOCELE REPAIR N/A 12/10/2012   Procedure: ANTERIOR REPAIR (CYSTOCELE);  Surgeon: Hal Morales, MD;  Location: [facility];  Service: Gynecology;  Laterality: N/A;   CYSTOSCOPY N/A 12/10/2012   Procedure: CYSTOSCOPY;  Surgeon: Purcell Nails, MD;  Location: [facility];  Service: Gynecology;  Laterality: N/A;   EXCISION VAGINAL CYST     MYOMECTOMY  85YRS AGO   HYSTEROSCOPIC   TUBAL LIGATION  10/10/1991   VAGINAL HYSTERECTOMY N/A 12/10/2012   Procedure: Total Vaginal Hysterectomy;  Surgeon: Hal Morales, MD;  Location:  [facility];  Service: Gynecology;  Laterality: N/A;    Family History  Problem Relation Age of Onset   Heart failure Father    Diabetes Mother    High blood pressure Mother    Arthritis Mother    Breast cancer Maternal Aunt        in her 86's    Breast cancer Cousin    Thyroid disease Sister    Colon cancer Neg Hx    Colon polyps Neg Hx    Esophageal cancer Neg Hx    Rectal cancer Neg Hx    Stomach cancer Neg Hx     Allergies  Allergen Reactions   Methimazole Anaphylaxis    EMS had to be called and a 7-day's stay in the hospital resulted   Nitroglycerin Other (See Comments)    Sweating    Other Itching and Other (See Comments)    Seasonal allergies & dog hair = runny nose also   Tetracycline Hcl Hypertension   Lisinopril Cough   Promethazine Nausea Only    Made the patient feel like she was going to vomit and it "spaced" her out   Shrimp [Shellfish Allergy] Itching   Latex Rash   Penicillins Other (See Comments)    Has patient had a PCN reaction causing immediate rash, facial/tongue/throat swelling, SOB or lightheadedness with hypotension: Unk Has patient had a PCN reaction causing severe rash involving mucus membranes or skin necrosis: Unk Has patient had a PCN reaction that required hospitalization: Unk Has patient had a PCN reaction occurring within the last 10 years: Yes If all of the above answers are "NO", then may proceed with Cephalosporin use.    Tape Rash   Terbutaline Other (See Comments)    Reaction not recalled (??)    Current Outpatient Medications on File Prior to Visit  Medication Sig Dispense Refill   acetaminophen (TYLENOL) 500 MG tablet Take 500-1,000 mg by mouth every 6 (six) hours as needed for mild pain or headache.     amLODipine (NORVASC) 10 MG tablet Take 1 tablet (10 mg total) by mouth daily. 90 tablet 0   azelastine (ASTELIN) 0.1 % nasal spray Place 2 sprays into both nostrils 2 (two) times daily. Use in each nostril as directed 30 mL 6    buPROPion (WELLBUTRIN XL) 150 MG 24 hr tablet TAKE 1 TABLET BY MOUTH DAILY 90 tablet 0   clobetasol cream (TEMOVATE) 0.05 % Apply 1 Application topically 2 (two) times daily. 30 g 1   methocarbamol (ROBAXIN) 500 MG tablet Take 1 tablet (500 mg total) by mouth every 8 (eight)  hours as needed for muscle spasms. 8 tablet 0   ondansetron (ZOFRAN-ODT) 4 MG disintegrating tablet Take 1 tablet (4 mg total) by mouth every 8 (eight) hours as needed for nausea or vomiting. 20 tablet 0   oxyCODONE-acetaminophen (PERCOCET/ROXICET) 5-325 MG tablet Take 1 tablet by mouth every 6 (six) hours as needed for severe pain. 6 tablet 0   pantoprazole (PROTONIX) 40 MG tablet Take 1 tablet (40 mg total) by mouth daily. 30 tablet 3   silver sulfADIAZINE (SILVADENE) 1 % cream Apply 1 Application topically daily. 50 g 0   sucralfate (CARAFATE) 1 GM/10ML suspension Take 10 mLs (1 g total) by mouth 4 (four) times daily -  with meals and at bedtime. 420 mL 0   EPINEPHrine (EPIPEN 2-PAK) 0.3 mg/0.3 mL IJ SOAJ injection Inject 0.3 mLs (0.3 mg total) into the muscle once for 1 dose. If you develop difficulty breathing 0.3 mL 0   No current facility-administered medications on file prior to visit.    BP 120/80   Pulse 75   Temp 97.9 F (36.6 C) (Oral)   Ht 5\' 3"  (1.6 m)   Wt 188 lb (85.3 kg)   LMP 12/03/2012   SpO2 96%   BMI 33.30 kg/m       Objective:   Physical Exam Vitals and nursing note reviewed.  Constitutional:      Appearance: Normal appearance. She is obese.  Cardiovascular:     Rate and Rhythm: Normal rate and regular rhythm.     Pulses: Normal pulses.     Heart sounds: Normal heart sounds.  Pulmonary:     Effort: Pulmonary effort is normal.     Breath sounds: Normal breath sounds.  Abdominal:     General: Bowel sounds are normal.     Tenderness: There is abdominal tenderness in the right lower quadrant, periumbilical area, suprapubic area and left lower quadrant. There is no right CVA tenderness or  left CVA tenderness. Positive signs include obturator sign. Negative signs include Murphy's sign, Rovsing's sign and McBurney's sign.     Comments: Significant tenderness with palpation to right lower and left lower abdomen   Musculoskeletal:       Back:     Right lower leg: Edema present.     Left lower leg: No edema.     Comments: Quite tender with palpation to lower lumbar paraspinal muscles   Skin:    General: Skin is warm and dry.     Capillary Refill: Capillary refill takes less than 2 seconds.  Neurological:     General: No focal deficit present.     Mental Status: She is alert and oriented to person, place, and time.  Psychiatric:        Mood and Affect: Mood normal.        Behavior: Behavior normal.        Thought Content: Thought content normal.        Judgment: Judgment normal.       Assessment & Plan:  1. Right lower quadrant abdominal pain - She does not want to go to the ER. Will order stat CT abdomen in the office today due to amount of pain she is experiencing. Will send in short course of Norco. I do not think I will be able to have her be seen by GI or General surgery any faster down here then what she is already scheduled for in [place]  - She knows to go to the ER with worsening abdominal  pain or other concerning symptoms  - Basic Metabolic Panel; Future - CT ABDOMEN PELVIS W CONTRAST; Future - HYDROcodone-acetaminophen (NORCO) 5-325 MG tablet; Take 1 tablet by mouth every 6 (six) hours as needed for moderate pain.  Dispense: 30 tablet; Refill: 0 - CBC with Differential/Platelet; Future - CBC with Differential/Platelet - Basic Metabolic Panel  2. Omental infarction [facility])  - Basic Metabolic Panel; Future - CT ABDOMEN PELVIS W CONTRAST; Future - HYDROcodone-acetaminophen (NORCO) 5-325 MG tablet; Take 1 tablet by mouth every 6 (six) hours as needed for moderate pain.  Dispense: 30 tablet; Refill: 0 - CBC with Differential/Platelet; Future - CBC with  Differential/Platelet - Basic Metabolic Panel  3. Diffuse abdominal pain  - Basic Metabolic Panel; Future - CT ABDOMEN PELVIS W CONTRAST; Future - HYDROcodone-acetaminophen (NORCO) 5-325 MG tablet; Take 1 tablet by mouth every 6 (six) hours as needed for moderate pain.  Dispense: 30 tablet; Refill: 0 - CBC with Differential/Platelet; Future - CBC with Differential/Platelet - Basic Metabolic Panel  [name], NP

## 2022-11-29 ENCOUNTER — Other Ambulatory Visit: Payer: Self-pay

## 2022-11-29 ENCOUNTER — Ambulatory Visit (HOSPITAL_BASED_OUTPATIENT_CLINIC_OR_DEPARTMENT_OTHER)
Admission: RE | Admit: 2022-11-29 | Discharge: 2022-11-29 | Disposition: A | Discharge status: Home / Self Care | Source: Ambulatory Visit | Attending: Adult Health | Admitting: Adult Health

## 2022-11-29 DIAGNOSIS — R1031 Right lower quadrant pain: Secondary | ICD-10-CM | POA: Insufficient documentation

## 2022-11-29 DIAGNOSIS — K55069 Acute infarction of intestine, part and extent unspecified: Secondary | ICD-10-CM | POA: Insufficient documentation

## 2022-11-29 DIAGNOSIS — R1084 Generalized abdominal pain: Secondary | ICD-10-CM | POA: Insufficient documentation

## 2022-11-29 MED ORDER — TAMSULOSIN HCL 0.4 MG PO CAPS: 0.4000 mg | ORAL_CAPSULE | Freq: Every day | ORAL | 0 refills | Status: DC

## 2022-11-29 MED ORDER — IOHEXOL 300 MG/ML  SOLN
100.0000 mL | Freq: Once | INTRAMUSCULAR | Status: AC | PRN
  Administered 2022-11-29: 80 mL via INTRAVENOUS

## 2022-12-03 ENCOUNTER — Ambulatory Visit (INDEPENDENT_AMBULATORY_CARE_PROVIDER_SITE_OTHER): Admitting: Internal Medicine

## 2022-12-07 ENCOUNTER — Other Ambulatory Visit: Payer: Self-pay | Admitting: Adult Health

## 2022-12-10 NOTE — Telephone Encounter (Signed)
Left message on machine for patient to return our call 

## 2022-12-10 NOTE — Telephone Encounter (Signed)
Spoke to the patient.  She is requesting 1 more refill because "her side still hurts a little".

## 2022-12-27 ENCOUNTER — Ambulatory Visit (INDEPENDENT_AMBULATORY_CARE_PROVIDER_SITE_OTHER): Admitting: Internal Medicine

## 2022-12-27 ENCOUNTER — Encounter (INDEPENDENT_AMBULATORY_CARE_PROVIDER_SITE_OTHER): Payer: Self-pay | Admitting: Internal Medicine

## 2022-12-27 VITALS — BP 132/84 | HR 65 | Ht 63.39 in | Wt 187.0 lb

## 2022-12-27 DIAGNOSIS — Z8249 Family history of ischemic heart disease and other diseases of the circulatory system: Secondary | ICD-10-CM

## 2022-12-27 DIAGNOSIS — I7121 Aneurysm of the ascending aorta, without rupture: Secondary | ICD-10-CM

## 2022-12-27 DIAGNOSIS — I700 Atherosclerosis of aorta: Secondary | ICD-10-CM

## 2022-12-27 DIAGNOSIS — R072 Precordial pain: Secondary | ICD-10-CM

## 2022-12-27 LAB — ECG 12-LEAD
Atrial Rate: 58 {beats}/min
P Axis: 51 degrees
P-R Interval: 176 ms
Q-T Interval: 424 ms
QRS Duration: 68 ms
QTC Calculation (Bezet): 416 ms
R Axis: 11 degrees
T Axis: 41 degrees
Ventricular Rate: 58 {beats}/min

## 2022-12-27 NOTE — Progress Notes (Signed)
[facility] OFFICE CONSULTATION    I had the pleasure of seeing [name] today for cardiovascular evaluation. She is a pleasant 62 y.o. female with a history of hypertension, hyperlipidemia who presents for cardiac evaluation.  She was referred by her PCP for coronary artery calcifications noted on CT calcium scoring from 2023, also noted to have dilated ascending aorta on CT.  Patient has occasional left-sided chest pains.  Symptoms are usually at rest.  She describes it as squeezing in nature, usually relieved by turning her body.  Symptoms last for about 2 to 3 minutes.  This does not occur frequently.  She walks for exercise, 2 to 3 miles a day.  She denies chest pain and shortness of breath with activity.  She has no palpitations, dizziness, syncope.  No history of smoking or alcohol use.  Her brother had CABG in his 74s.  Her father died of an MI at the age of 71 years.  She monitors her blood pressure readings at home, normal blood pressure readings.  She is not currently on antihypertensives.  Patient lives between [place] and [place].          PAST MEDICAL HISTORY: She has no past medical history on file. She has no past surgical history on file.        MEDICATIONS: Current Medications[1]        ALLERGIES: Allergies[2]      FAMILY HISTORY: Her family history includes Coronary artery disease in her father; Diabetes type II in her sister; Heart disease in her brother and mother; Hyperlipidemia in her sister and sister; Hypertension in her brother, mother, sister, and sister.      SOCIAL HISTORY: She reports that she has never smoked. She has never been exposed to tobacco smoke. She has never used smokeless tobacco. She reports that she does not currently use alcohol. She reports that she does not use drugs.      REVIEW OF SYSTEMS: All other systems reviewed and negative except as above.         PHYSICAL EXAMINATION  General Appearance:  A well-appearing female in no acute distress.     Vital Signs: BP 132/84 (BP Site: Left arm, Patient Position: Sitting, Cuff Size: Large)   Pulse 65   Ht 1.61 m (5' 3.39")   Wt 84.8 kg (187 lb)   SpO2 97%   BMI 32.72 kg/m    HEENT: Sclera anicteric, conjunctiva without pallor, moist mucous membranes, normal dentition. No arcus.   Neck:  Supple without jugular venous distention. Thyroid nonpalpable. Normal carotid upstrokes without bruits.   Chest: Clear to auscultation bilaterally with good air movement and respiratory effort and no wheezes, rales, or rhonchi   Cardiovascular: Normal S1 and physiologically split S2 without murmurs. No gallops or rub. PMI of normal size and nondisplaced.   Extremities: Warm without edema  Skin: No rash, xanthoma or xanthelasma.   Neuro: Alert and oriented x3. Grossly intact. Strength is symmetrical. Normal mood and affect.           ECG: Today, I have independently reviewed the tracing, sinus rhythm, borderline LVH.      CT coronary calcium score 2023    FINDINGS:   Coronary arteries: Normal origins.     Coronary Calcium Score:     Left main: 21.2     Left anterior descending artery: 131     Left circumflex artery: 0     Right coronary artery: 0.367  Total: 152     Percentile: 98th     Pericardium: Normal.     Ascending Aorta: Mildly dilated.  3.8 cm.  Aortic atherosclerosis.     Non-cardiac: See separate report from [facility].     IMPRESSION:   Coronary calcium score of 152. This was 98th percentile for age-,   race-, and sex-matched controls.     Ascending aorta mildly dilated.  3.8 cm.     Aortic atherosclerosis.     Echocardiogram 10/18/2016    Study Conclusions     - Left ventricle: The cavity size was normal. Wall thickness was     normal. Systolic function was vigorous. The estimated ejection     fraction was in the range of 65% to 70%.     LABS:   Lab Results   Component Value Date    WBC 7.03 11/06/2022    HGB 14.2 11/06/2022    HCT 42.7 11/06/2022    PLT 215 11/06/2022    NA 139 11/06/2022    K 4.3  11/06/2022    BUN 12 11/06/2022    CREAT 0.8 11/06/2022    GLU 98 11/06/2022    AST 12 11/06/2022    ALT 15 11/06/2022    HGBA1C 5.3 11/06/2022    TSH 1.85 11/06/2022     Recent Labs     11/06/22  1215   Cholesterol 263*   Triglycerides 145   HDL 44   LDL Calculated 190*                      IMPRESSION/RECOMMENDATIONS:     Hypertension -adequately controlled off of medications.  Recommend low-sodium diet, weight loss and regular cardiovascular exercise.  Continue to follow with PCP.  Mild LVH on ECG today.  Echocardiogram for left ventricular wall thickness.    Hyperlipidemia -LDL significantly elevated 190.  LDL goal of less than 100, preferably less than 70 given coronary artery calcifications.  She was prescribed rosuvastatin by her PCP, she has not yet received medications.  Counseled on low-cholesterol diet, she reports that her diet has been healthy.  She is currently exercising regularly.  Continue same.  Continue with weight loss.  Normal AST and ALT 12 and 15.  Continue to monitor with PCP.    Coronary artery calcifications - CT calcium score 152 in 2023.  Atypical chest pain at rest, no exertional component.  No acute ECG changes.  Will get a treadmill stress test given risk factors.  Agree with statins as above, LDL goals as above.    Obesity -commend weight loss.    Dilated ascending aorta -3.8 cm on CT 2023, will get baseline echocardiogram for monitoring.    Follow-up in 6 months.        Rich Number, MD  12/27/2022, 11:03 AM  [facility]  [phone]         [1]   Current Outpatient Medications   Medication Sig Dispense Refill    fluticasone (FLONASE) 50 MCG/ACT nasal spray 2 sprays by Nasal route daily 16 g 2    loratadine (CLARITIN) 10 MG tablet Take 1 tablet (10 mg) by mouth daily 30 tablet 2    rosuvastatin (CRESTOR) 10 MG tablet Take 1 tablet (10 mg) by mouth nightly 90 tablet 1     No current facility-administered medications for this visit.   [2]   Allergies  Allergen  Reactions    Methimazole Anaphylaxis  EMS had to be called and a 7-day's stay in the hospital resulted    Nitroglycerin Other (See Comments)     Sweating    Tetracycline Hypertension and Other (See Comments)    Lisinopril Cough    Promethazine Nausea Only     Made the patient feel like she was going to vomit and it "spaced" her out    Shellfish Allergy Itching    Latex Rash    Penicillins Other (See Comments)     Has patient had a PCN reaction causing immediate rash, facial/tongue/throat swelling, SOB or lightheadedness with hypotension: Unk   Has patient had a PCN reaction causing severe rash involving mucus membranes or skin necrosis: Unk   Has patient had a PCN reaction that required hospitalization: Unk   Has patient had a PCN reaction occurring within the last 10 years: Yes   If all of the above answers are "NO", then may proceed with Cephalosporin use.    Tape Rash    Terbutaline Other (See Comments)     Reaction not recalled (??)    Wound Dressing Adhesive Rash

## 2022-12-30 ENCOUNTER — Ambulatory Visit (INDEPENDENT_AMBULATORY_CARE_PROVIDER_SITE_OTHER): Admitting: Emergency Medicine

## 2022-12-30 ENCOUNTER — Encounter (INDEPENDENT_AMBULATORY_CARE_PROVIDER_SITE_OTHER): Payer: Self-pay | Admitting: Emergency Medicine

## 2022-12-30 ENCOUNTER — Other Ambulatory Visit (INDEPENDENT_AMBULATORY_CARE_PROVIDER_SITE_OTHER): Payer: Self-pay | Admitting: Internal Medicine

## 2022-12-30 VITALS — BP 161/94 | HR 87 | Temp 98.0°F | Resp 18 | Ht 63.0 in | Wt 187.0 lb

## 2022-12-30 DIAGNOSIS — J029 Acute pharyngitis, unspecified: Secondary | ICD-10-CM

## 2022-12-30 DIAGNOSIS — E785 Hyperlipidemia, unspecified: Secondary | ICD-10-CM

## 2022-12-30 LAB — STREP A POCT GOHEALTH: Rapid Strep A Screen POCT: NEGATIVE

## 2022-12-30 NOTE — Telephone Encounter (Signed)
Name, strength, directions of requested refill(s):    Rosuvastatin (Crestor) 10 mg    Pharmacy to send refill to or patient to pick up rx from office (mark requested pharmacy in [place]):      [facility] - [place], [place] - [address]  [address]  [place] [address]  Phone: [phone] Fax: [phone]        Patient Preferred Callback Number: [phone]        Next visit: 01/28/2023

## 2022-12-30 NOTE — Telephone Encounter (Signed)
Pt requesting medication sent to [facility] instead of [facility]    Prescription Refill Checklist    Completed Process Reviewed Notes   [x]  Verified med on pt chart.    [x]  2.  Reviewed patient allergies    [x]  3.  Review last encounters since OV Last OV 12/27/22   []  4.  Determine if pt has f/u as instructed or needs OV    []  5.  If OV required, pend 30 day supply of medication    [x]  6.  Review labs are up to date. Last labs 11/06/22   []  7. Filled by RN 90 day x 0 May only be done 1 x yearly   DATE:         Reviewed -  TS

## 2022-12-30 NOTE — Progress Notes (Signed)
[facility]  PROGRESS NOTE     Patient: Jody [name]   Date: 12/30/2022   MRN: [id number]       Tarya Mccosh is a 62 y.o. female      HISTORY     History obtained from: Patient    Chief Complaint   Patient presents with    Sore Throat     Pt/c: sore throat ,  since yesterday , the pt took tylenol         HPI   34-year-old female presents to the clinic with onset of sore throat earlier today, known close contact with strep positive individual in the house.  No associated fever, nasal congestion, cough or bodyaches.  Has been taking Tylenol.  Prior history of COVID in 2022, she is vaccinated      Review of Systems    History:    Pertinent Past Medical, Surgical, Family and Social History were reviewed.      Current Medications[1]    Allergies[2]    Medications and Allergies reviewed.    PHYSICAL EXAM     Vitals:    12/30/22 1759   BP: (!) 161/94   Pulse: 87   Resp: 18   Temp: 98 F (36.7 C)   SpO2: 96%   Weight: 84.8 kg (187 lb)   Height: 1.6 m (5\' 3" )       Physical Exam  Constitutional:       General: She is not in acute distress.     Appearance: She is well-developed.   HENT:      Head: Normocephalic and atraumatic.      Right Ear: Tympanic membrane and external ear normal.      Left Ear: Tympanic membrane and external ear normal.      Mouth/Throat:      Pharynx: No oropharyngeal exudate.     Eyes: Conjunctivae are normal. Right conjunctiva is not injected. Left conjunctiva is not injected. No scleral icterus. Cardiovascular:      Rate and Rhythm: Normal rate and regular rhythm.      Heart sounds: Normal heart sounds. No murmur heard.  Pulmonary:      Effort: Pulmonary effort is normal. No respiratory distress.      Breath sounds: Normal breath sounds. No wheezing.   Musculoskeletal:      Cervical back: Normal range of motion and neck supple.   Lymphadenopathy:      Cervical: No cervical adenopathy.   Neurological:      Mental Status: She is alert and oriented to person, place, and time.   Skin:     General:  Skin is warm and dry.      Findings: No rash.   Vitals and nursing note reviewed.         UCC COURSE     LABS  The following POCT tests were ordered, reviewed and discussed with the patient/family.     Results       Procedure Component Value Units Date/Time    Rapid Strep A POCT [[id number]]  (Normal) Collected: 12/30/22 1835    Specimen: Throat Updated: 12/30/22 1835     POCT QC Pass     Rapid Strep A Screen POCT Negative            There were no x-rays reviewed with this patient during the visit.    Current Inpatient Medications with Last Dose Taken[3]    PROCEDURES     Procedures    MEDICAL DECISION MAKING  History, physical, labs/studies most consistent with sore throat as the diagnosis.    Chart Review:  Prior PCP, Specialist and/or ED notes reviewed today: Not Applicable  Prior labs/images/studies reviewed today: Not Applicable    Differential Diagnosis:       ASSESSMENT     Encounter Diagnosis   Name Primary?    Sore throat Yes            PLAN      PLAN:   62 year old female presents to the clinic with sore throat starting earlier today, known close contact with strep positive usual in the house.  Rapid strep testing is negative.  No evidence of bacterial infection at this time.  Discharged home with routine OTC recommendations for  sore throat  Follow-up as needed      Orders Placed This Encounter   Procedures    Rapid Strep A POCT     Requested Prescriptions      No prescriptions requested or ordered in this encounter       Discussed results and diagnosis with patient/family.  Reviewed warning signs for worsening condition, as well as, indications for follow-up with primary care physician and return to urgent care clinic.   Patient/family expressed understanding of instructions.     An After Visit Summary was provided to the patient.           [1]   Current Outpatient Medications:     fluticasone (FLONASE) 50 MCG/ACT nasal spray, 2 sprays by Nasal route daily (Patient not taking: Reported on 12/30/2022),  Disp: 16 g, Rfl: 2    loratadine (CLARITIN) 10 MG tablet, Take 1 tablet (10 mg) by mouth daily (Patient not taking: Reported on 12/30/2022), Disp: 30 tablet, Rfl: 2    rosuvastatin (CRESTOR) 10 MG tablet, Take 1 tablet (10 mg) by mouth nightly (Patient not taking: Reported on 12/30/2022), Disp: 90 tablet, Rfl: 1  [2]   Allergies  Allergen Reactions    Methimazole Anaphylaxis     EMS had to be called and a 7-day's stay in the hospital resulted    Nitroglycerin Other (See Comments)     Sweating    Tetracycline Hypertension and Other (See Comments)    Lisinopril Cough    Promethazine Nausea Only     Made the patient feel like she was going to vomit and it "spaced" her out    Shellfish Allergy Itching    Latex Rash    Penicillins Other (See Comments)     Has patient had a PCN reaction causing immediate rash, facial/tongue/throat swelling, SOB or lightheadedness with hypotension: Unk   Has patient had a PCN reaction causing severe rash involving mucus membranes or skin necrosis: Unk   Has patient had a PCN reaction that required hospitalization: Unk   Has patient had a PCN reaction occurring within the last 10 years: Yes   If all of the above answers are "NO", then may proceed with Cephalosporin use.    Tape Rash    Terbutaline Other (See Comments)     Reaction not recalled (??)    Wound Dressing Adhesive Rash   [3]   No current facility-administered medications for this visit.

## 2022-12-31 MED ORDER — ROSUVASTATIN CALCIUM 10 MG PO TABS
10.0000 mg | ORAL_TABLET | Freq: Every evening | ORAL | 0 refills | Status: AC
Start: 2022-12-31 — End: ?

## 2023-01-23 ENCOUNTER — Ambulatory Visit: Admitting: Surgery

## 2023-01-23 ENCOUNTER — Ambulatory Visit (INDEPENDENT_AMBULATORY_CARE_PROVIDER_SITE_OTHER): Admitting: Family Medicine

## 2023-01-23 ENCOUNTER — Encounter (INDEPENDENT_AMBULATORY_CARE_PROVIDER_SITE_OTHER): Payer: Self-pay | Admitting: Family Medicine

## 2023-01-23 ENCOUNTER — Ambulatory Visit (INDEPENDENT_AMBULATORY_CARE_PROVIDER_SITE_OTHER)

## 2023-01-23 VITALS — BP 136/90 | HR 78

## 2023-01-23 DIAGNOSIS — M25571 Pain in right ankle and joints of right foot: Secondary | ICD-10-CM

## 2023-01-23 DIAGNOSIS — Z9889 Other specified postprocedural states: Secondary | ICD-10-CM

## 2023-01-23 DIAGNOSIS — Z8781 Personal history of (healed) traumatic fracture: Secondary | ICD-10-CM

## 2023-01-23 NOTE — Progress Notes (Signed)
[facility]                  Date of Exam: 01/23/2023 3:07 PM      Patient ID: Jody [name] is a 62 y.o. female.  Attending Physician: Hanley Ben, DO      Chief Complaint:   Chief Complaint   Patient presents with    Ankle Pain            HPI:   HPI  NP here for R ankle pain ongoing for years. S/p ORIF ankle fx about 6 years ago. Worsening pain since that time in lateral and anterior ankle. Pain radiates to foot and endorses occasional numb/ting. Endorses limited ROM compared to contralateral. Pain is constant and worse with activity. Prior tx including Tylenol.      ROS:   As per HPI.  Otherwise as below.  Review of Systems   Constitutional:  Negative for activity change and chills.   HENT:  Negative for congestion.    Respiratory:  Negative for chest tightness and shortness of breath.    Cardiovascular:  Negative for chest pain and palpitations.   Gastrointestinal:  Negative for abdominal pain and constipation.   Genitourinary:  Negative for difficulty urinating and dysuria.           Problem List:   Problem List[1]     Current Meds:   Medications Taking[2]      Allergies:   Allergies[3]     Past Surgical History:   Past Surgical History[4]       Family History:   Family History[5]       Social History:   Social History[6]       The following sections were reviewed this encounter by the provider:             Vital Signs:   BP 136/90   Pulse 78   SpO2 96%           Physical Exam:   Physical Exam  Vitals and nursing note reviewed.   Constitutional:       General: She is not in acute distress.     Appearance: Normal appearance. She is not ill-appearing.   HENT:      Head: Normocephalic and atraumatic.      Right Ear: External ear normal.      Left Ear: External ear normal.      Nose: Nose normal. No congestion.      Mouth/Throat:      Mouth: Mucous membranes are moist.   Eyes:      Pupils: Pupils are equal, round, and reactive to light.   Musculoskeletal:       Comments: Examination of the R ankle reveals full AROM.  Tenderness along the lateral malleolus.  No erythema or edema   Neurological:      Mental Status: She is alert.   Psychiatric:         Mood and Affect: Mood normal.               Procedure(s):   Procedures        Radiology:   X-rays obtained today and reviewed.  3 views of the R ankle.  S/p previous R lateral malleolus ORIF         Assessment:   1. Right ankle pain, unspecified chronicity  - X-ray ankle right AP lateral and oblique including standing  - Referral to Orthopedic Surgery - EXTERNAL; Future  2. History of ankle fracture  - Referral to Orthopedic Surgery ([place])  - Referral to Orthopedic Surgery - EXTERNAL; Future    3. S/P ORIF (open reduction internal fixation) fracture  - Referral to Orthopedic Surgery - EXTERNAL; Future           Plan:   1. Right ankle pain, unspecified chronicity    2. History of ankle fracture    3. S/P ORIF (open reduction internal fixation) fracture       Patient with persistent lateral ankle pain s/p ORIF for distal fibular fracture.  She discussed potential hardware removal with her previous surgeon in [place] but moved here before it could be performed.  She is interested in discussion potential hardware removal and will refer her to trauma surgery for a surgical consultation         Follow-up:   No follow-ups on file.          Lonell Face II, DO                [1] There is no problem list on file for this patient.   [2]   Outpatient Medications Marked as Taking for the 01/23/23 encounter (Office Visit) with Hanley Ben, DO   Medication Sig Dispense Refill    amLODIPine (NORVASC) 10 MG tablet Take 1 tablet (10 mg) by mouth daily      rosuvastatin (CRESTOR) 10 MG tablet Take 1 tablet (10 mg) by mouth nightly 90 tablet 0   [3]   Allergies  Allergen Reactions    Methimazole Anaphylaxis     EMS had to be called and a 7-day's stay in the hospital resulted    Nitroglycerin Other (See Comments)     Sweating     Tetracycline Hypertension and Other (See Comments)    Lisinopril Cough    Promethazine Nausea Only     Made the patient feel like she was going to vomit and it "spaced" her out    Shellfish Allergy Itching    Latex Rash    Penicillins Other (See Comments)     Has patient had a PCN reaction causing immediate rash, facial/tongue/throat swelling, SOB or lightheadedness with hypotension: Unk   Has patient had a PCN reaction causing severe rash involving mucus membranes or skin necrosis: Unk   Has patient had a PCN reaction that required hospitalization: Unk   Has patient had a PCN reaction occurring within the last 10 years: Yes   If all of the above answers are "NO", then may proceed with Cephalosporin use.    Tape Rash    Terbutaline Other (See Comments)     Reaction not recalled (??)    Wound Dressing Adhesive Rash   [4] No past surgical history on file.  [5]   Family History  Problem Relation Name Age of Onset    Heart disease Mother      Hypertension Mother      Coronary artery disease Father          massive MI    Hyperlipidemia Sister      Hypertension Sister      Diabetes type II Sister      Hyperlipidemia Sister      Hypertension Sister      Heart disease Brother      Hypertension Brother     [6]   Social History  Tobacco Use    Smoking status: Never     Passive exposure: Never  Smokeless tobacco: Never   Vaping Use    Vaping status: Never Used   Substance Use Topics    Alcohol use: Not Currently    Drug use: Never

## 2023-01-24 ENCOUNTER — Encounter (INDEPENDENT_AMBULATORY_CARE_PROVIDER_SITE_OTHER): Payer: Self-pay

## 2023-01-25 ENCOUNTER — Other Ambulatory Visit: Payer: Self-pay | Admitting: Adult Health

## 2023-01-25 DIAGNOSIS — I10 Essential (primary) hypertension: Secondary | ICD-10-CM

## 2023-01-28 ENCOUNTER — Ambulatory Visit (INDEPENDENT_AMBULATORY_CARE_PROVIDER_SITE_OTHER)

## 2023-01-28 ENCOUNTER — Encounter (INDEPENDENT_AMBULATORY_CARE_PROVIDER_SITE_OTHER): Payer: Self-pay

## 2023-01-28 ENCOUNTER — Other Ambulatory Visit (INDEPENDENT_AMBULATORY_CARE_PROVIDER_SITE_OTHER): Payer: Self-pay | Admitting: Cardiovascular Disease

## 2023-01-28 ENCOUNTER — Ambulatory Visit: Admitting: Gastroenterology

## 2023-01-28 DIAGNOSIS — R072 Precordial pain: Secondary | ICD-10-CM

## 2023-01-28 NOTE — Progress Notes (Signed)
Patient underwent exercise stress test to 7 METS had marked hypertensive response systolic blood pressure 220/108.  We discussed Lexiscan nuclear imaging coronary CTA.  We both decided that coronary CTA would be best test to give a definitive answer as etiology of her chest pain.  Although up with Dr. Yetta Barre after coronary CTA is completed

## 2023-01-29 LAB — CARDIAC STRESS TEST

## 2023-01-30 ENCOUNTER — Ambulatory Visit (INDEPENDENT_AMBULATORY_CARE_PROVIDER_SITE_OTHER): Admitting: Student in an Organized Health Care Education/Training Program

## 2023-01-30 ENCOUNTER — Ambulatory Visit (INDEPENDENT_AMBULATORY_CARE_PROVIDER_SITE_OTHER)

## 2023-01-30 ENCOUNTER — Telehealth (INDEPENDENT_AMBULATORY_CARE_PROVIDER_SITE_OTHER): Payer: Self-pay | Admitting: Student in an Organized Health Care Education/Training Program

## 2023-01-30 NOTE — Telephone Encounter (Signed)
Called pt for eta or to r/s if needed. Unable to reach and couldn't lvm as mailbox full

## 2023-01-31 ENCOUNTER — Ambulatory Visit (FREE_STANDING_LABORATORY_FACILITY): Admitting: Family Medicine

## 2023-01-31 ENCOUNTER — Encounter (INDEPENDENT_AMBULATORY_CARE_PROVIDER_SITE_OTHER): Payer: Self-pay

## 2023-01-31 ENCOUNTER — Telehealth (INDEPENDENT_AMBULATORY_CARE_PROVIDER_SITE_OTHER): Payer: Self-pay

## 2023-01-31 ENCOUNTER — Other Ambulatory Visit (INDEPENDENT_AMBULATORY_CARE_PROVIDER_SITE_OTHER): Payer: Self-pay | Admitting: Internal Medicine

## 2023-01-31 ENCOUNTER — Encounter (INDEPENDENT_AMBULATORY_CARE_PROVIDER_SITE_OTHER): Payer: Self-pay | Admitting: Family Medicine

## 2023-01-31 VITALS — BP 132/90 | HR 77 | Temp 97.8°F | Ht 62.99 in | Wt 186.0 lb

## 2023-01-31 DIAGNOSIS — R9439 Abnormal result of other cardiovascular function study: Secondary | ICD-10-CM

## 2023-01-31 DIAGNOSIS — Z0000 Encounter for general adult medical examination without abnormal findings: Secondary | ICD-10-CM

## 2023-01-31 DIAGNOSIS — Z1231 Encounter for screening mammogram for malignant neoplasm of breast: Secondary | ICD-10-CM

## 2023-01-31 DIAGNOSIS — E785 Hyperlipidemia, unspecified: Secondary | ICD-10-CM

## 2023-01-31 DIAGNOSIS — Z124 Encounter for screening for malignant neoplasm of cervix: Secondary | ICD-10-CM

## 2023-01-31 DIAGNOSIS — Z1211 Encounter for screening for malignant neoplasm of colon: Secondary | ICD-10-CM

## 2023-01-31 DIAGNOSIS — I10 Essential (primary) hypertension: Secondary | ICD-10-CM

## 2023-01-31 LAB — COMPREHENSIVE METABOLIC PANEL
ALT: 18 U/L (ref 0–55)
AST (SGOT): 12 U/L (ref 5–41)
Albumin/Globulin Ratio: 1.1 (ref 0.9–2.2)
Albumin: 4.1 g/dL (ref 3.5–5.0)
Alkaline Phosphatase: 125 U/L — ABNORMAL HIGH (ref 37–117)
Anion Gap: 7 (ref 5.0–15.0)
BUN: 10 mg/dL (ref 7–21)
Bilirubin, Total: 0.6 mg/dL (ref 0.2–1.2)
CO2: 28 meq/L (ref 17–29)
Calcium: 9.7 mg/dL (ref 8.5–10.5)
Chloride: 106 meq/L (ref 99–111)
Creatinine: 0.9 mg/dL (ref 0.4–1.0)
GFR: >60 mL/min/{1.73_m2} (ref >=60.0)
Globulin: 3.6 g/dL (ref 2.0–3.6)
Glucose: 97 mg/dL (ref 70–100)
Hemolysis Index: 11 {index}
Potassium: 4.6 meq/L (ref 3.5–5.3)
Protein, Total: 7.7 g/dL (ref 6.0–8.3)
Sodium: 141 meq/L (ref 135–145)

## 2023-01-31 LAB — LAB USE ONLY - CBC WITH DIFFERENTIAL
Absolute Basophils: 0.05 10*3/uL (ref 0.00–0.08)
Absolute Eosinophils: 0.16 10*3/uL (ref 0.00–0.44)
Absolute Immature Granulocytes: 0.02 10*3/uL (ref 0.00–0.07)
Absolute Lymphocytes: 1.8 10*3/uL (ref 0.42–3.22)
Absolute Monocytes: 0.57 10*3/uL (ref 0.21–0.85)
Absolute Neutrophils: 4.67 10*3/uL (ref 1.10–6.33)
Absolute nRBC: 0 10*3/uL (ref <=0.00)
Basophils %: 0.7 %
Eosinophils %: 2.2 %
Hematocrit: 47 % — ABNORMAL HIGH (ref 34.7–43.7)
Hemoglobin: 15.3 g/dL — ABNORMAL HIGH (ref 11.4–14.8)
Immature Granulocytes %: 0.3 %
Lymphocytes %: 24.8 %
MCH: 28.8 pg (ref 25.1–33.5)
MCHC: 32.6 g/dL (ref 31.5–35.8)
MCV: 88.3 fL (ref 78.0–96.0)
MPV: 12 fL (ref 8.9–12.5)
Monocytes %: 7.8 %
Neutrophils %: 64.2 %
Platelet Count: 212 10*3/uL (ref 142–346)
Preliminary Absolute Neutrophil Count: 4.67 10*3/uL (ref 1.10–6.33)
RBC: 5.32 10*6/uL — ABNORMAL HIGH (ref 3.90–5.10)
RDW: 12 % (ref 11–15)
WBC: 7.27 10*3/uL (ref 3.10–9.50)
nRBC %: 0 /100{WBCs} (ref <=0.0)

## 2023-01-31 LAB — LIPID PANEL
Cholesterol / HDL Ratio: 6 {index}
Cholesterol: 283 mg/dL — ABNORMAL HIGH (ref <=199)
HDL: 47 mg/dL (ref >=40)
LDL Calculated: 191 mg/dL — ABNORMAL HIGH (ref 0–99)
Triglycerides: 223 mg/dL — ABNORMAL HIGH (ref 34–149)
VLDL Calculated: 45 mg/dL — ABNORMAL HIGH (ref 10–40)

## 2023-01-31 LAB — THYROID STIMULATING HORMONE (TSH) WITH REFLEX TO FREE T4: TSH: 2.2 u[IU]/mL (ref 0.35–4.94)

## 2023-01-31 LAB — HEMOGLOBIN A1C
Average Estimated Glucose: 108.3 mg/dL
Hemoglobin A1C: 5.4 % (ref 4.6–5.6)

## 2023-01-31 MED ORDER — ROSUVASTATIN CALCIUM 10 MG PO TABS
10.0000 mg | ORAL_TABLET | Freq: Every evening | ORAL | 1 refills | Status: DC
Start: 2023-01-31 — End: 2023-06-12

## 2023-01-31 MED ORDER — AMLODIPINE BESYLATE 10 MG PO TABS
10.0000 mg | ORAL_TABLET | Freq: Every day | ORAL | 1 refills | Status: DC
Start: 2023-01-31 — End: 2023-06-12

## 2023-01-31 NOTE — Telephone Encounter (Signed)
I have spoken with patient regarding stress test result and Dr. Celene Skeen request for a nuclear lexiscan to be scheduled due to non diagnostic stress test.  Pt verbalized understanding.  Lorna Few, PAA will call patient to assist scheduling Lexi scan.

## 2023-01-31 NOTE — Progress Notes (Signed)
[facility]   OFFICE VISIT                  HPI   Chief Complaint   Patient presents with    Annual Exam      HPI   62 year old female here for annual exam.  She has high stress going through a divorce and trying to sell old house in [place].  She has a therapist currently.  She is living with her daughter.  She is looking forward to finding a house of her own.  Patient is trying to eat healthier and plans to resume walks/runs when it is nicer outside.    ROS   Review of Systems   Constitutional:  Negative for chills and fever.   Respiratory:  Negative for shortness of breath.    Cardiovascular:  Negative for chest pain.   Gastrointestinal:  Negative for abdominal pain, diarrhea, nausea and vomiting.   Genitourinary:  Negative for difficulty urinating.   Psychiatric/Behavioral:  Negative for agitation and behavioral problems.          Vital Signs   BP 132/90 (BP Site: Left arm)   Pulse 77   Temp 97.8 F (36.6 C) (Oral)   Ht 1.6 m (5' 2.99")   Wt 84.4 kg (186 lb)   LMP  (LMP Unknown)   BMI 32.96 kg/m      Physical Exam   Physical Exam  Constitutional:       Appearance: Normal appearance.   HENT:      Mouth/Throat:      Mouth: Mucous membranes are moist.      Pharynx: No posterior oropharyngeal erythema.   Cardiovascular:      Rate and Rhythm: Normal rate and regular rhythm.   Pulmonary:      Effort: Pulmonary effort is normal.      Breath sounds: Normal breath sounds.   Abdominal:      Tenderness: There is no abdominal tenderness.   Neurological:      Mental Status: She is alert and oriented to person, place, and time.   Psychiatric:         Mood and Affect: Mood normal.         Behavior: Behavior normal.           Assessment/Plan   1. Annual physical exam  - CBC with Differential (Order); Future  - Comprehensive Metabolic Panel; Future  - Lipid Panel; Future  - Hemoglobin A1C; Future  - Thyroid Stimulating Hormone (TSH) with Reflex to Free T4; Future  - CBC with Differential (Order)  - Comprehensive  Metabolic Panel  - Lipid Panel  - Hemoglobin A1C  - Thyroid Stimulating Hormone (TSH) with Reflex to Free T4  Annual eye/dental exam advised  Dash diet, daily exercise encouraged  She has a therapist   2. Encounter for screening mammogram for malignant neoplasm of breast  Mammogram is scheduled    3. Colon cancer screening  - Referral to Gastroenterology ([place]); Future  Will refer    4. Cervical cancer screening  - Referral to Obstetrics/Gynecology (EXTERNAL); Future    5. Hyperlipidemia, unspecified hyperlipidemia type  - rosuvastatin (CRESTOR) 10 MG tablet; Take 1 tablet (10 mg) by mouth nightly  Dispense: 90 tablet; Refill: 1  Patient states last prescription was not received by pharmacy  Will resend  [employer], increasing exercise advised    6. Primary hypertension  - amLODIPine (NORVASC) 10 MG tablet; Take 1 tablet (10 mg) by  mouth daily  Dispense: 90 tablet; Refill: 1  DBP is above goal  Dash diet encouraged  Daily walks/runs encouraged

## 2023-01-31 NOTE — Progress Notes (Signed)
Have you seen any specialists/other providers since your last visit with [place]?    no    Arm preference verified?   Yes, LEFT ARM    Health Maintenance Due   Topic Date Due    Cervical Cancer Screening Three Years  Never done    Colorectal Cancer Screening  Never done    Advance Directive on File  Never done    Annual Exam  Never done    HEPATITIS C SCREENING  Never done    MAMMOGRAM  07/10/2014    INFLUENZA VACCINE  12/05/2022    COVID-19 Vaccine (4 - 2023-24 season) 01/05/2023

## 2023-01-31 NOTE — Progress Notes (Signed)
Nondiagnostic ETT, hypertensive blood pressure response.  Will order Lexiscan nuclear stress test.

## 2023-02-03 ENCOUNTER — Telehealth: Payer: Self-pay

## 2023-02-03 NOTE — Telephone Encounter (Signed)
Patient has been referred by Primary Care Provider for a Screening Colonoscopy. ([facility] SCREENING PROCEDURE POOL) Patient is interested in having Screening Colonoscopy with [facility].   Please call patient to get the process started. Patient was made aware that Procedure Team will get back to them as soon as they can but the turnaround time is roughly 5-6 months for the interview and scheduling.      Agent confirmed this patient is having no other symptoms at the time of this call. (For other issues like GERD, Abdominal Pain, Anemia etc. a consult is required)      Please send MyChart Pre-Op questionnaire if applicable to this chart.

## 2023-02-05 ENCOUNTER — Encounter (INDEPENDENT_AMBULATORY_CARE_PROVIDER_SITE_OTHER): Payer: Self-pay

## 2023-02-05 ENCOUNTER — Other Ambulatory Visit (INDEPENDENT_AMBULATORY_CARE_PROVIDER_SITE_OTHER)

## 2023-02-05 ENCOUNTER — Encounter (INDEPENDENT_AMBULATORY_CARE_PROVIDER_SITE_OTHER): Payer: Self-pay | Admitting: Internal Medicine

## 2023-02-05 DIAGNOSIS — R9439 Abnormal result of other cardiovascular function study: Secondary | ICD-10-CM

## 2023-02-05 MED ORDER — TECHNETIUM TC 99M TETROFOSMIN IV KIT
10.0000 | PACK | Freq: Once | INTRAVENOUS | Status: AC | PRN
Start: 2023-02-05 — End: 2023-02-05
  Administered 2023-02-05: 10 via INTRAVENOUS

## 2023-02-05 MED ORDER — TECHNETIUM TC 99M TETROFOSMIN IV KIT
35.0000 | PACK | Freq: Once | INTRAVENOUS | Status: AC | PRN
Start: 2023-02-05 — End: 2023-02-05
  Administered 2023-02-05: 35 via INTRAVENOUS

## 2023-02-05 MED ORDER — REGADENOSON 0.4 MG/5ML IV SOLN
0.4000 mg | Freq: Once | INTRAVENOUS | Status: AC | PRN
Start: 2023-02-05 — End: 2023-02-05
  Administered 2023-02-05: 0.4 mg via INTRAVENOUS
  Filled 2023-02-05: qty 5

## 2023-02-06 LAB — NM MYOCARDIAL PERFUSION SPECT W STRESS AND REST: Stress LV Ejection Fraction: 56

## 2023-02-11 ENCOUNTER — Encounter (INDEPENDENT_AMBULATORY_CARE_PROVIDER_SITE_OTHER): Payer: Self-pay | Admitting: Student in an Organized Health Care Education/Training Program

## 2023-02-11 ENCOUNTER — Ambulatory Visit
Admission: RE | Admit: 2023-02-11 | Discharge: 2023-02-11 | Disposition: A | Discharge status: Home / Self Care | Source: Ambulatory Visit | Attending: Internal Medicine | Admitting: Internal Medicine

## 2023-02-11 DIAGNOSIS — I7121 Aneurysm of the ascending aorta, without rupture: Secondary | ICD-10-CM | POA: Insufficient documentation

## 2023-02-11 LAB — ECHO ADULT TTE COMPLETE
AV Area (Cont Eq VTI): 2.531
AV Mean Gradient: 4
AV Peak Velocity: 1.32
Ao Root Diameter (2D): 3
BP Mod LV Ejection Fraction: 58
IVS Diastolic Thickness (2D): 0.966
LA Dimension (2D): 3.4
LA Volume Index (BP A-L): 17
LVID diastole (2D): 4.14
LVID systole (2D): 2.81
MV E/A: 0.611
MV E/e' (Average): 7.344
Mitral Valve Findings: NORMAL
Prox Ascending Aorta Diameter: 3.6
Pulmonary Valve Findings: NORMAL
RV Basal Diastolic Dimension: 2.32
RV Systolic Pressure: 23.25
TAPSE: 2.52
Tricuspid Valve Findings: NORMAL

## 2023-02-12 ENCOUNTER — Other Ambulatory Visit: Payer: Self-pay | Admitting: Obstetrics & Gynecology

## 2023-02-18 ENCOUNTER — Telehealth (INDEPENDENT_AMBULATORY_CARE_PROVIDER_SITE_OTHER): Payer: Self-pay | Admitting: Family Medicine

## 2023-02-18 ENCOUNTER — Encounter (INDEPENDENT_AMBULATORY_CARE_PROVIDER_SITE_OTHER): Admitting: Family Medicine

## 2023-02-18 NOTE — Telephone Encounter (Signed)
Pt called for lab results. I reviewed lab results.

## 2023-02-18 NOTE — Telephone Encounter (Signed)
Patient called in asking about kidney stones. Call transferred from MA to RN. RN advised patient she had abdominal CT scan done in late July in [place] that showed non-obstructing kidney stones. Patient states is still having abdominal pain. RN educated patient that she would need to see a provider to f/u since imaging was done in July and patient is still having symptoms. Patient verbalized understanding, scheduled for visit this afternoon with Dr. Rutherford Limerick.

## 2023-04-07 ENCOUNTER — Encounter (INDEPENDENT_AMBULATORY_CARE_PROVIDER_SITE_OTHER): Payer: Self-pay | Admitting: Family

## 2023-04-07 ENCOUNTER — Ambulatory Visit (INDEPENDENT_AMBULATORY_CARE_PROVIDER_SITE_OTHER): Admitting: Family

## 2023-04-07 VITALS — BP 161/84 | HR 73 | Temp 97.4°F | Resp 16 | Ht 63.0 in | Wt 185.0 lb

## 2023-04-07 DIAGNOSIS — I10 Essential (primary) hypertension: Secondary | ICD-10-CM

## 2023-04-07 DIAGNOSIS — L089 Local infection of the skin and subcutaneous tissue, unspecified: Secondary | ICD-10-CM

## 2023-04-07 DIAGNOSIS — H9202 Otalgia, left ear: Secondary | ICD-10-CM

## 2023-04-07 DIAGNOSIS — M26609 Unspecified temporomandibular joint disorder, unspecified side: Secondary | ICD-10-CM

## 2023-04-07 MED ORDER — CYCLOBENZAPRINE HCL 5 MG PO TABS
5.0000 mg | ORAL_TABLET | Freq: Every evening | ORAL | 0 refills | Status: AC | PRN
Start: 2023-04-07 — End: ?

## 2023-04-07 MED ORDER — CEPHALEXIN 500 MG PO CAPS
500.0000 mg | ORAL_CAPSULE | Freq: Two times a day (BID) | ORAL | 0 refills | Status: AC
Start: 2023-04-07 — End: 2023-04-14

## 2023-04-07 NOTE — Progress Notes (Signed)
Patient: Jody [name]   Date: 04/09/2023   MRN: [id number]     Subjective     Chief Complaint   Patient presents with    Otalgia     Pt c/o ear pain x 5 days. bumps on palm of hand.           Otalgia       Jody [name] is a 62 y.o. female with past medical history of hypertension here today for left ear pain that started 5 days ago.  Patient dates that the pain is constant and rates it 5 out of 10.  Patient has not taken anything over-the-counter for pain today.  Patient has a history of TMJ but has not recently has issues with it.  Patient denies any nasal congestion, ear drainage, fever, chills, or use of a Q-tip.  Patient also has a bump on the palm of her right hand that is been ongoing for past week.  The area is tender to touch however does not feel any pain when she is not touching it.  Denies any trauma or injury to the hand.  Patient does wash her hand with the hot water and uses alcohol gait and wash all the time.  Patient denies any redness, swelling, warmth, known trauma or injury or any insect bite.  Patient used Neosporin over-the-counter which help with the symptoms.  Patient denies any dental pain or gum swelling.  Patient did that she was diagnosed with hypertension and was prescribed medication a month ago.  She took it for a week and she lost her medication.  Patient however remembers where she reported and was planning to take the medication as as he gets home.  She denies any chest pain, palpitation, shortness of breath or any headaches.  History obtained from: patient.      History:  Pertinent Past Medical, Surgical, Family and Social History were reviewed.  Current Medications[1]  Allergies[2]  Medications and Allergies reviewed.         Objective   Vitals:    04/07/23 1402 04/07/23 1430   BP: (!) 171/99 161/84   BP Site: Left arm    Patient Position: Sitting    Cuff Size: Medium    Pulse: 75 73   Resp: 16    Temp: 97.4 F (36.3 C)    TempSrc: Tympanic    SpO2: 98%    Weight: 83.9 kg (185 lb)     Height: 1.6 m (5\' 3" )      Body mass index is 32.77 kg/m.    Physical Exam  Constitutional:       General: She is not in acute distress.     Appearance: Normal appearance. She is not ill-appearing, toxic-appearing or diaphoretic.   HENT:      Head: Normocephalic and atraumatic. No right periorbital erythema or left periorbital erythema.      Jaw: There is normal jaw occlusion. Tenderness present. No trismus, swelling or pain on movement.      Comments: Tenderness to palpation and tightness to left TMJ joint and to left masseter     Right Ear: Tympanic membrane, ear canal and external ear normal. No drainage or tenderness. No middle ear effusion. There is no impacted cerumen. No mastoid tenderness. Tympanic membrane is not erythematous or bulging.      Left Ear: Tympanic membrane, ear canal and external ear normal. No drainage or tenderness.  No middle ear effusion. There is no impacted cerumen. No mastoid tenderness. Tympanic membrane is not  erythematous or bulging.      Nose: Nose normal.      Right Sinus: No maxillary sinus tenderness or frontal sinus tenderness.      Left Sinus: No maxillary sinus tenderness or frontal sinus tenderness.      Mouth/Throat:      Lips: Pink.      Mouth: Mucous membranes are moist. No oral lesions.      Dentition: Normal dentition. No dental tenderness, gingival swelling, dental caries, dental abscesses or gum lesions.      Tongue: No lesions.      Palate: No lesions.      Pharynx: Oropharynx is clear. Uvula midline. No pharyngeal swelling, oropharyngeal exudate, posterior oropharyngeal erythema or uvula swelling.      Tonsils: No tonsillar exudate or tonsillar abscesses.      Comments: No hot potato voice or unilateral neck swelling     Eyes: Conjunctivae are normal. Cardiovascular:      Rate and Rhythm: Normal rate and regular rhythm.   Pulmonary:      Effort: Pulmonary effort is normal. No respiratory distress.      Breath sounds: Normal breath sounds. No stridor. No wheezing,  rhonchi or rales.   Musculoskeletal:         General: Normal range of motion.      Cervical back: Normal range of motion. No rigidity or tenderness.   Lymphadenopathy:      Cervical: No cervical adenopathy.   Neurological:      Mental Status: She is alert and oriented to person, place, and time.   Skin:     General: Skin is warm.      Capillary Refill: Capillary refill takes less than 2 seconds.      Findings: No rash.             Comments: There is a dry scab papule on the thenar eminence of right palm.  There are few pustules noted.  There is no erythema, flocculence, surrounding induration, or any discharge from the area. There is mild generalized ttp to the area. No focal joint tenderness. Distal sensation intact, compartments of hand soft and cap refill less than 2 sec.    Psychiatric:         Behavior: Behavior normal.   Vitals and nursing note reviewed.              UCC Course   There were no labs reviewed with this patient during the visit.  There were no x-rays reviewed with this patient during the visit.  Current Inpatient Medications with Last Dose Taken[3]       Procedures   Procedures      MDM/Assessment    62 y.o. female presented with a chief complaint of left ear pain and rash on right palm.Patient is overall well appearing, vitals reassuring, non toxic, not in acute distress, and tolerating PO well. The patient has no hx of diabetes or immunosuppression. HEENT exam normal and patient has no trismus, no dysphagia, no voice change, no unilateral tonsillar swelling or uvular deviation. No tenderness or redness to mastoid or redness. Patient is without facial or eyelid swelling, vision changes, or pain with eye movement. The patient has no neck stiffness and exam demonstrated no rash or meningeal signs. Dentition normal.    Initial differential diagnosis considered but not limited to orbital/preseptal sinusitis, dental abscess, mastoiditis, otitis media, otitis externa, sinusitis, cellulitis, abscess,  insect bite, impetigo      Based on HPI and  PE, symptoms likely due to TMJ syndrome and dishydrotic eczema   Will tx TMJ syndrome with Flexeril 5 mg ar bed time PRN and Naproxen 500 mg BID PRN and empirically treat for bacterial infection with Keflex 500 mg BID x 7 days for infection due to pustule on pt's palm   Symptomatic management of TMJ discussed including limiting stress, alternating ice and heat, avoiding gum chewing/excessive talking, proper posture and neck stretches   Do not use hot water, over washing or alcohol based sanitizer to wash hand and use Vaseline at bed time and after washing hand    Blood pressure elevated at the visit and likely due to patient not taking her meds. Considered sending refill for meds, pt declined an stated that she remembered where her BP med is and will be taking it when she goes home.  Take home BP BID and f/u with PCP In 3-5 days   Patient informed to follow up with PCP in 3-5 days, dentist in a week and derm if sx not better in 2-3 days    Patient/family received education on the working diagnosis, diagnostic uncertainties and proposed treatment plan. The patient/family were informed that based on the current history, examination, and impression, serious pathologies are unlikely. However, early in a disease process, evaluations might not fully reflect the potential progression and can be falsely reassuring. The possibility of illness progression was discussed and strict ED precautions for serious pathologies were given to patient/family.    The indications for early follow-up with PCP and return to urgent care were discussed. If specialist follow up was recommended, counseled on time frame for follow up along with how to make appointment. Prescribed and OTC medication safe use and side effects, supportive care and home remedies discussed.   Written and verbal discharge instructions were provided and discussed. All questions from the patient and family were answered to  their satisfaction, and they confirmed understanding of the instructions with no apparent learning barriers.       Chart Review:  Prior PCP, Specialist and/or ED notes reviewed today: No  Prior labs/images/studies reviewed today: No      Encounter Diagnoses   Name Primary?    TMJ (temporomandibular joint disorder)     Otalgia, left ear     Skin pustule Yes    Essential (primary) hypertension                                                                                                                                                                      Plan    See mdm     Orders Placed This Encounter   Procedures    Referral to Dermatology (EXTERNAL)     Requested Prescriptions  Signed Prescriptions Disp Refills    cyclobenzaprine (FLEXERIL) 5 MG tablet 10 tablet 0     Sig: Take 1 tablet (5 mg) by mouth nightly as needed for Muscle spasms    cephALEXin (KEFLEX) 500 MG capsule 14 capsule 0     Sig: Take 1 capsule (500 mg) by mouth 2 (two) times daily for 7 days          An After Visit Summary with pertinent information was made available to patient/family via MyChart or in-print.       [1]   Current Outpatient Medications:     amLODIPine (NORVASC) 10 MG tablet, Take 1 tablet (10 mg) by mouth daily, Disp: 90 tablet, Rfl: 1    rosuvastatin (CRESTOR) 10 MG tablet, Take 1 tablet (10 mg) by mouth nightly, Disp: 90 tablet, Rfl: 1    cephALEXin (KEFLEX) 500 MG capsule, Take 1 capsule (500 mg) by mouth 2 (two) times daily for 7 days, Disp: 14 capsule, Rfl: 0    cyclobenzaprine (FLEXERIL) 5 MG tablet, Take 1 tablet (5 mg) by mouth nightly as needed for Muscle spasms, Disp: 10 tablet, Rfl: 0  [2]   Allergies  Allergen Reactions    Methimazole Anaphylaxis     EMS had to be called and a 7-day's stay in the hospital resulted    Nitroglycerin Other (See Comments)     Sweating    Tetracycline Hypertension and Other (See Comments)    Lisinopril Cough    Promethazine Nausea Only     Made the patient feel like she was going to  vomit and it "spaced" her out    Shellfish Allergy Itching    Latex Rash    Penicillins Other (See Comments)     Has patient had a PCN reaction causing immediate rash, facial/tongue/throat swelling, SOB or lightheadedness with hypotension: Unk   Has patient had a PCN reaction causing severe rash involving mucus membranes or skin necrosis: Unk   Has patient had a PCN reaction that required hospitalization: Unk   Has patient had a PCN reaction occurring within the last 10 years: Yes   If all of the above answers are "NO", then may proceed with Cephalosporin use.    Tape Rash    Terbutaline Other (See Comments)     Reaction not recalled (??)    Wound Dressing Adhesive Rash   [3]   No current facility-administered medications for this visit.

## 2023-04-09 ENCOUNTER — Encounter (INDEPENDENT_AMBULATORY_CARE_PROVIDER_SITE_OTHER): Payer: Self-pay | Admitting: Family

## 2023-06-02 ENCOUNTER — Encounter (INDEPENDENT_AMBULATORY_CARE_PROVIDER_SITE_OTHER): Payer: Self-pay

## 2023-06-12 ENCOUNTER — Encounter (INDEPENDENT_AMBULATORY_CARE_PROVIDER_SITE_OTHER): Payer: Self-pay | Admitting: Family Medicine

## 2023-06-12 ENCOUNTER — Telehealth (INDEPENDENT_AMBULATORY_CARE_PROVIDER_SITE_OTHER): Admitting: Family Medicine

## 2023-06-12 DIAGNOSIS — I10 Essential (primary) hypertension: Secondary | ICD-10-CM

## 2023-06-12 DIAGNOSIS — F419 Anxiety disorder, unspecified: Secondary | ICD-10-CM

## 2023-06-12 DIAGNOSIS — Z6832 Body mass index (BMI) 32.0-32.9, adult: Secondary | ICD-10-CM

## 2023-06-12 DIAGNOSIS — E785 Hyperlipidemia, unspecified: Secondary | ICD-10-CM

## 2023-06-12 DIAGNOSIS — E66811 Obesity, class 1: Secondary | ICD-10-CM

## 2023-06-12 DIAGNOSIS — F32A Depression, unspecified: Secondary | ICD-10-CM

## 2023-06-12 MED ORDER — ROSUVASTATIN CALCIUM 10 MG PO TABS
10.0000 mg | ORAL_TABLET | Freq: Every evening | ORAL | 2 refills | Status: AC
Start: 2023-06-12 — End: ?

## 2023-06-12 MED ORDER — SERTRALINE HCL 25 MG PO TABS
25.0000 mg | ORAL_TABLET | Freq: Every day | ORAL | 1 refills | Status: AC
Start: 2023-06-12 — End: ?

## 2023-06-12 MED ORDER — AMLODIPINE BESYLATE 10 MG PO TABS
10.0000 mg | ORAL_TABLET | Freq: Every day | ORAL | 2 refills | Status: AC
Start: 2023-06-12 — End: ?

## 2023-06-12 NOTE — Progress Notes (Signed)
VIDEO VISIT PERFORMED TODAY.  Verbal consent has been obtained from the patient to conduct this video visit encounter.  She is present in [place] at the time of this visit.

## 2023-06-12 NOTE — Assessment & Plan Note (Signed)
Orders:    rosuvastatin (CRESTOR) 10 MG tablet; Take 1 tablet (10 mg) by mouth nightly  patient asked to resume this

## 2023-06-12 NOTE — Progress Notes (Signed)
 [facility]   OFFICE VISIT            Consent: Verbal consent has been obtained from the patient to conduct this video visit encounter. The time spent in medical discussion during this visit was 12 minutes.      HPI     Chief Complaint   Patient presents with    ADHD    Weight Loss     Zepbound       HPI  63 year old female assessed via video for f/u  Patient has high stress with her family.  She states she has lost her medications and recently resumed amlodipine .    ROS   Review of Systems   Constitutional:  Negative for chills and fever.   Respiratory:  Negative for shortness of breath.    Cardiovascular:  Negative for chest pain.   Gastrointestinal:  Negative for abdominal pain, diarrhea, nausea and vomiting.   Psychiatric/Behavioral:  Positive for decreased concentration. Negative for suicidal ideas. The patient is nervous/anxious.        Vital Signs   There were no vitals taken for this visit.    Physical Exam   Physical Exam    Assessment/Plan     Assessment & Plan  Class 1 obesity without serious comorbidity with body mass index (BMI) of 32.0 to 32.9 in adult, unspecified obesity type  Patient encouraged on mediterranean diet and increasing exercise.  Will discuss zepbound at a later date       Primary hypertension    Orders:    amLODIPine  (NORVASC ) 10 MG tablet; Take 1 tablet (10 mg) by mouth daily  patient asked to monitor BP 2-3 times/week  Dash diet advised    Hyperlipidemia, unspecified hyperlipidemia type    Orders:    rosuvastatin  (CRESTOR ) 10 MG tablet; Take 1 tablet (10 mg) by mouth nightly  patient asked to resume this    Anxiety and depression    Orders:    sertraline  (ZOLOFT ) 25 MG tablet; Take 1 tablet (25 mg) by mouth daily  relaxation techniques discussed  She has therapist  Possible side effects discussed

## 2023-07-10 ENCOUNTER — Ambulatory Visit (INDEPENDENT_AMBULATORY_CARE_PROVIDER_SITE_OTHER): Admitting: Family Medicine

## 2023-07-31 ENCOUNTER — Ambulatory Visit (INDEPENDENT_AMBULATORY_CARE_PROVIDER_SITE_OTHER): Admitting: Family Medicine

## 2023-09-08 DIAGNOSIS — M25579 Pain in unspecified ankle and joints of unspecified foot: Secondary | ICD-10-CM | POA: Insufficient documentation

## 2023-09-08 DIAGNOSIS — R519 Headache, unspecified: Secondary | ICD-10-CM | POA: Insufficient documentation

## 2023-09-10 DIAGNOSIS — J309 Allergic rhinitis, unspecified: Secondary | ICD-10-CM | POA: Insufficient documentation

## 2023-09-10 DIAGNOSIS — H9190 Unspecified hearing loss, unspecified ear: Secondary | ICD-10-CM | POA: Insufficient documentation

## 2023-10-09 ENCOUNTER — Ambulatory Visit: Admitting: Family Medicine

## 2023-10-16 DIAGNOSIS — F419 Anxiety disorder, unspecified: Secondary | ICD-10-CM | POA: Insufficient documentation

## 2023-10-20 ENCOUNTER — Emergency Department (HOSPITAL_COMMUNITY)

## 2023-10-20 ENCOUNTER — Encounter (HOSPITAL_COMMUNITY): Payer: Self-pay

## 2023-10-20 ENCOUNTER — Emergency Department (HOSPITAL_COMMUNITY)
Admission: EM | Admit: 2023-10-20 | Discharge: 2023-10-20 | Disposition: A | Discharge status: Home / Self Care | Attending: Emergency Medicine | Admitting: Emergency Medicine

## 2023-10-20 ENCOUNTER — Other Ambulatory Visit: Payer: Self-pay

## 2023-10-20 DIAGNOSIS — R072 Precordial pain: Secondary | ICD-10-CM | POA: Diagnosis not present

## 2023-10-20 DIAGNOSIS — E039 Hypothyroidism, unspecified: Secondary | ICD-10-CM | POA: Insufficient documentation

## 2023-10-20 DIAGNOSIS — R569 Unspecified convulsions: Secondary | ICD-10-CM | POA: Diagnosis present

## 2023-10-20 LAB — CBC WITH DIFFERENTIAL/PLATELET
Abs Immature Granulocytes: 0.03 10*3/uL (ref 0.00–0.07)
Basophils Absolute: 0.1 10*3/uL (ref 0.0–0.1)
Basophils Relative: 1 %
Eosinophils Absolute: 0 10*3/uL (ref 0.0–0.5)
Eosinophils Relative: 0 %
HCT: 43.7 % (ref 36.0–46.0)
Hemoglobin: 14.4 g/dL (ref 12.0–15.0)
Immature Granulocytes: 0 %
Lymphocytes Relative: 26 %
Lymphs Abs: 2.3 10*3/uL (ref 0.7–4.0)
MCH: 28.9 pg (ref 26.0–34.0)
MCHC: 33 g/dL (ref 30.0–36.0)
MCV: 87.8 fL (ref 80.0–100.0)
Monocytes Absolute: 1.6 10*3/uL — ABNORMAL HIGH (ref 0.1–1.0)
Monocytes Relative: 18 %
Neutro Abs: 5 10*3/uL (ref 1.7–7.7)
Neutrophils Relative %: 55 %
Platelets: 229 10*3/uL (ref 150–400)
RBC: 4.98 MIL/uL (ref 3.87–5.11)
RDW: 12.4 % (ref 11.5–15.5)
WBC: 9 10*3/uL (ref 4.0–10.5)
nRBC: 0 % (ref 0.0–0.2)

## 2023-10-20 LAB — COMPREHENSIVE METABOLIC PANEL WITH GFR
ALT: 13 U/L (ref 0–44)
AST: 28 U/L (ref 15–41)
Albumin: 3.9 g/dL (ref 3.5–5.0)
Alkaline Phosphatase: 79 U/L (ref 38–126)
Anion gap: 12 (ref 5–15)
BUN: 8 mg/dL (ref 8–23)
CO2: 20 mmol/L — ABNORMAL LOW (ref 22–32)
Calcium: 9.3 mg/dL (ref 8.9–10.3)
Chloride: 103 mmol/L (ref 98–111)
Creatinine, Ser: 0.8 mg/dL (ref 0.44–1.00)
GFR, Estimated: >60 mL/min (ref >60)
Glucose, Bld: 115 mg/dL — ABNORMAL HIGH (ref 70–99)
Potassium: 4.2 mmol/L (ref 3.5–5.1)
Sodium: 135 mmol/L (ref 135–145)
Total Bilirubin: 1 mg/dL (ref 0.0–1.2)
Total Protein: 7.6 g/dL (ref 6.5–8.1)

## 2023-10-20 LAB — TROPONIN I (HIGH SENSITIVITY): Troponin I (High Sensitivity): 4 ng/L (ref <18)

## 2023-10-20 LAB — ETHANOL: Alcohol, Ethyl (B): <15 mg/dL (ref <15)

## 2023-10-20 LAB — MAGNESIUM: Magnesium: 2.4 mg/dL (ref 1.7–2.4)

## 2023-10-20 LAB — CBG MONITORING, ED: Glucose-Capillary: 127 mg/dL — ABNORMAL HIGH (ref 70–99)

## 2023-10-20 MED ORDER — ACETAMINOPHEN 500 MG PO TABS
1000.0000 mg | ORAL_TABLET | Freq: Once | ORAL | Status: AC
  Administered 2023-10-20: 1000 mg via ORAL
  Filled 2023-10-20: qty 2

## 2023-10-20 MED ORDER — LAMOTRIGINE 25 MG PO TABS: ORAL_TABLET | ORAL | 0 refills | Status: DC

## 2023-10-20 MED ORDER — LORAZEPAM 2 MG/ML IJ SOLN
INTRAMUSCULAR | Status: AC
  Filled 2023-10-20: qty 1

## 2023-10-20 MED ORDER — LEVETIRACETAM (KEPPRA) 500 MG/5 ML ADULT IV PUSH
1000.0000 mg | Freq: Once | INTRAVENOUS | Status: AC
  Administered 2023-10-20: 1000 mg via INTRAVENOUS
  Filled 2023-10-20: qty 10

## 2023-10-20 MED ORDER — GADOBUTROL 1 MMOL/ML IV SOLN
8.0000 mL | Freq: Once | INTRAVENOUS | Status: AC | PRN
Start: 2023-10-20 — End: 2023-10-20
  Administered 2023-10-20: 8 mL via INTRAVENOUS

## 2023-10-20 MED ORDER — LEVETIRACETAM 500 MG PO TABS: 500.0000 mg | ORAL_TABLET | Freq: Two times a day (BID) | ORAL | 0 refills | Status: DC

## 2023-10-20 NOTE — Consult Note (Shared)
 NEUROLOGY CONSULT NOTE   Date of service: October 20, 2023 Patient Name: April Gonzalez MRN:  [id number] DOB:  02-18-1961 Chief Complaint: *** Requesting Provider: Early Glisson, MD  History of Present Illness  April Gonzalez is a 63 y.o. female with hx of ***      ROS  ***Comprehensive ROS performed and pertinent positives documented in HPI  ***Unable to ascertain due to ***  Past History   Past Medical History:  Diagnosis Date   Allergy    Anemia 05/2012   transfusion   Fibroids    H/O myomectomy   GERD (gastroesophageal reflux disease)    rare    H/O uterine prolapse    History of blood transfusion    x 3    Hyperlipidemia    Hypertension    Rectal bleeding    NOTED AT pv 01-09-17- PT STATES with most BM's for the last 2 years, also hurts    Seizures (HCC)    last seizure age 76 or 10 per pt report-01-09-17 no change in this   Thyroid  disorder     Past Surgical History:  Procedure Laterality Date   ANKLE SURGERY     One plate, seven screws in the right ankle    BLADDER SUSPENSION N/A 12/10/2012   Procedure: TRANSVAGINAL TAPE (TVT) PROCEDURE;  Surgeon: Madelene Schanz, MD;  Location: [facility];  Service: Gynecology;  Laterality: N/A;   CYSTOCELE REPAIR N/A 12/10/2012   Procedure: ANTERIOR REPAIR (CYSTOCELE);  Surgeon: Stevenson Elbe, MD;  Location: [facility];  Service: Gynecology;  Laterality: N/A;   CYSTOSCOPY N/A 12/10/2012   Procedure: CYSTOSCOPY;  Surgeon: Madelene Schanz, MD;  Location: [facility];  Service: Gynecology;  Laterality: N/A;   EXCISION VAGINAL CYST     MYOMECTOMY  80YRS AGO   HYSTEROSCOPIC   TUBAL LIGATION  10/10/1991   VAGINAL HYSTERECTOMY N/A 12/10/2012   Procedure: Total Vaginal Hysterectomy;  Surgeon: Stevenson Elbe, MD;  Location: [facility];  Service: Gynecology;  Laterality: N/A;    Family History: Family History  Problem Relation Age of Onset   Heart failure Father    Diabetes Mother    High blood pressure Mother    Arthritis Mother    Breast cancer  Maternal Aunt        in her 42's    Breast cancer Cousin    Thyroid  disease Sister    Colon cancer Neg Hx    Colon polyps Neg Hx    Esophageal cancer Neg Hx    Rectal cancer Neg Hx    Stomach cancer Neg Hx     Social History  reports that she has never smoked. She has never used smokeless tobacco. She reports current alcohol use. She reports that she does not use drugs.  Allergies  Allergen Reactions   Methimazole Anaphylaxis    EMS had to be called and a 7-day's stay in the hospital resulted   Nitroglycerin Other (See Comments)    Sweating    Other Itching and Other (See Comments)    Seasonal allergies & dog hair = runny nose also   Tetracycline Hcl Hypertension   Lisinopril  Cough   Promethazine  Nausea Only    Made the patient feel like she was going to vomit and it spaced her out   Shrimp [Shellfish Allergy] Itching   Latex Rash   Penicillins Other (See Comments)    Has patient had a PCN reaction causing immediate rash, facial/tongue/throat swelling, SOB or lightheadedness with hypotension: Unk  Has patient had a PCN reaction causing severe rash involving mucus membranes or skin necrosis: Unk Has patient had a PCN reaction that required hospitalization: Unk Has patient had a PCN reaction occurring within the last 10 years: Yes If all of the above answers are NO, then may proceed with Cephalosporin use.    Tape Rash   Terbutaline Other (See Comments)    Reaction not recalled (??)    Medications   Current Facility-Administered Medications:    acetaminophen  (TYLENOL ) tablet 1,000 mg, 1,000 mg, Oral, Once, April Gonzalez, Taylor, DO   LORazepam (ATIVAN) 2 MG/ML injection, , , ,   Current Outpatient Medications:    acetaminophen  (TYLENOL ) 500 MG tablet, Take 500-1,000 mg by mouth every 6 (six) hours as needed for mild pain or headache., Disp: , Rfl:    amLODipine  (NORVASC ) 10 MG tablet, TAKE 1 TABLET BY MOUTH EVERY DAY, Disp: 90 tablet, Rfl: 0   azelastine  (ASTELIN ) 0.1 %  nasal spray, Place 2 sprays into both nostrils 2 (two) times daily. Use in each nostril as directed, Disp: 30 mL, Rfl: 6   buPROPion  (WELLBUTRIN  XL) 150 MG 24 hr tablet, TAKE 1 TABLET BY MOUTH DAILY, Disp: 90 tablet, Rfl: 0   clobetasol  cream (TEMOVATE ) 0.05 %, Apply 1 Application topically 2 (two) times daily., Disp: 30 g, Rfl: 1   EPINEPHrine  (EPIPEN  2-PAK) 0.3 mg/0.3 mL IJ SOAJ injection, Inject 0.3 mLs (0.3 mg total) into the muscle once for 1 dose. If you develop difficulty breathing, Disp: 0.3 mL, Rfl: 0   HYDROcodone -acetaminophen  (NORCO) 5-325 MG tablet, Take 1 tablet by mouth every 6 (six) hours as needed for moderate pain., Disp: 30 tablet, Rfl: 0   methocarbamol  (ROBAXIN ) 500 MG tablet, Take 1 tablet (500 mg total) by mouth every 8 (eight) hours as needed for muscle spasms., Disp: 8 tablet, Rfl: 0   ondansetron  (ZOFRAN -ODT) 4 MG disintegrating tablet, Take 1 tablet (4 mg total) by mouth every 8 (eight) hours as needed for nausea or vomiting., Disp: 20 tablet, Rfl: 0   oxyCODONE -acetaminophen  (PERCOCET/ROXICET) 5-325 MG tablet, Take 1 tablet by mouth every 6 (six) hours as needed for severe pain., Disp: 6 tablet, Rfl: 0   pantoprazole  (PROTONIX ) 40 MG tablet, Take 1 tablet (40 mg total) by mouth daily., Disp: 30 tablet, Rfl: 3   silver  sulfADIAZINE  (SILVADENE ) 1 % cream, Apply 1 Application topically daily., Disp: 50 g, Rfl: 0   sucralfate  (CARAFATE ) 1 GM/10ML suspension, Take 10 mLs (1 g total) by mouth 4 (four) times daily -  with meals and at bedtime., Disp: 420 mL, Rfl: 0   tamsulosin  (FLOMAX ) 0.4 MG CAPS capsule, TAKE 1 CAPSULE BY MOUTH DAILY, Disp: 20 capsule, Rfl: 1  Vitals   Vitals:   10/20/23 1813 10/20/23 1815 10/20/23 1828 10/20/23 1845  BP:  (!) 155/91  131/84  Pulse:  86 91 87  Resp: 16  19 19   Temp:      TempSrc:      SpO2:  94%  93%  Weight:      Height:        Body mass index is 32.59 kg/m.   Physical Exam   Constitutional: Appears well-developed and  well-nourished. *** Psych: Affect appropriate to situation. *** Eyes: No scleral injection. *** HENT: No OP obstruction. *** Head: Normocephalic. *** Cardiovascular: Normal rate and regular rhythm. *** Respiratory: Effort normal, non-labored breathing. *** GI: Soft.  No distension. There is no tenderness. *** Skin: WDI. ***  Neurologic Examination   ***  Labs/Imaging/Neurodiagnostic studies   CBC:  Recent Labs  Lab 2023/11/05 1818  WBC 9.0  NEUTROABS 5.0  HGB 14.4  HCT 43.7  MCV 87.8  PLT 229   Basic Metabolic Panel:  Lab Results  Component Value Date   NA 135 November 05, 2023   K 4.2 11/05/2023   CO2 20 (L) 2023/11/05   GLUCOSE 115 (H) 11-05-2023   BUN 8 2023-11-05   CREATININE 0.80 Nov 05, 2023   CALCIUM  9.3 Nov 05, 2023   GFRNONAA >60 11/05/23   GFRAA >60 12/03/2019   Lipid Panel:  Lab Results  Component Value Date   LDLCALC 188 (H) 12/06/2021   HgbA1c:  Lab Results  Component Value Date   HGBA1C 5.9 12/06/2021   Urine Drug Screen:     Component Value Date/Time   LABOPIA NONE DETECTED 03/05/2015 2354   COCAINSCRNUR NONE DETECTED 03/05/2015 2354   LABBENZ NONE DETECTED 03/05/2015 2354   AMPHETMU NONE DETECTED 03/05/2015 2354   THCU NONE DETECTED 03/05/2015 2354   LABBARB NONE DETECTED 03/05/2015 2354    Alcohol Level     Component Value Date/Time   ETH <15 2023/11/05 1818   INR  Lab Results  Component Value Date   INR 1.1 09/28/2022   APTT  Lab Results  Component Value Date   APTT 25 01/18/2017   AED levels: No results found for: PHENYTOIN, ZONISAMIDE, LAMOTRIGINE, LEVETIRACETA  CT Head without contrast(Personally reviewed): ***  CT angio Head and Neck with contrast(Personally reviewed): ***  MR Angio head without contrast and Carotid Duplex BL(Personally reviewed): ***  MRI Brain(Personally reviewed): ***  Neurodiagnostics rEEG:  ***  ASSESSMENT   Alveta Quintela is a 63 y.o. female ***  RECOMMENDATIONS   *** ______________________________________________________________________    Janese Medicine, MD [facility]

## 2023-10-20 NOTE — ED Provider Notes (Signed)
  [facility] Provider Note   CSN: [id number] Arrival date & time: 10/20/23  1739     History Chief Complaint  Patient presents with   Chest Pain    April Gonzalez is a 63 y.o. female w/ PMHx iron  deficiency anemia, seizures, low B12, hypothyroidism, obesity, vasovagal syncope who presents to the ED for evaluation of chest pain.  While in triage patient had witnessed episode of left sided facial twitching, rightward gaze and became nonverbal. This lasted approx 30sec-29min. Patient had multiple episodes of vomiting after.  Patient did not have any incontinence.  No tonic-clonic activity.  Episode resolved prior to receiving medication.  During my evaluation patient appears tired and speech is slowed but she is alert and oriented.  No slurring of speech.  No focal neurologic deficit.  Patient states she came to the ED due to 2 episodes of chest pain.  Patient states she was very active today cleaning out her garage.  She states during dinner she had an multiple seconds of sharp back pain.  This resolved spontaneously.  She states she laid down to watch TV and had 2 episodes of sharp chest pain that lasted approximately 1 to 2 seconds with approximately 1 minute in between.  This prompted patient to come to the ER.  She states she is on day 3 of Augmentin  for a sinus infection.  Patient denies any shortness of breath.  No other medication changes.  Patient states she has a history of seizures when she was a child approximately 33 years of age.  She does not remember if she was on medication for this or any details of the seizures.  She states she has never had a seizure as an adult.  She is not on antiepileptic medication.  She is not on anticoagulation.    Physical Exam Updated Vital Signs BP 137/87   Pulse 100   Temp 98.4 F (36.9 C) (Temporal)   Resp (!) 21   Ht 5' 3 (1.6 m)   Wt 83.5 kg   LMP 12/03/2012   SpO2 92%   BMI 32.59 kg/m  Physical  Exam Vitals and nursing note reviewed.  Constitutional:      General: She is not in acute distress.    Appearance: She is well-developed.     Comments: Tired appearing  HENT:     Head: Normocephalic and atraumatic.   Eyes:     Conjunctiva/sclera: Conjunctivae normal.    Cardiovascular:     Rate and Rhythm: Normal rate and regular rhythm.     Heart sounds: Normal heart sounds. No murmur heard. Pulmonary:     Effort: Pulmonary effort is normal. No respiratory distress.     Breath sounds: Normal breath sounds.  Chest:     Chest wall: No mass, tenderness or crepitus.  Abdominal:     Palpations: Abdomen is soft.     Tenderness: There is no abdominal tenderness.   Musculoskeletal:        General: No swelling.     Cervical back: Neck supple.   Skin:    General: Skin is warm and dry.     Capillary Refill: Capillary refill takes less than 2 seconds.   Neurological:     General: No focal deficit present.     Mental Status: She is alert and oriented to person, place, and time.   Psychiatric:        Mood and Affect: Mood normal.     ED  Results / Procedures / Treatments   Labs (all labs ordered are listed, but only abnormal results are displayed) Labs Reviewed  COMPREHENSIVE METABOLIC PANEL WITH GFR - Abnormal; Notable for the following components:      Result Value   CO2 20 (*)    Glucose, Bld 115 (*)    All other components within normal limits  CBC WITH DIFFERENTIAL/PLATELET - Abnormal; Notable for the following components:   Monocytes Absolute 1.6 (*)    All other components within normal limits  CBG MONITORING, ED - Abnormal; Notable for the following components:   Glucose-Capillary 127 (*)    All other components within normal limits  ETHANOL  MAGNESIUM  RAPID URINE DRUG SCREEN, HOSP PERFORMED  TROPONIN I (HIGH SENSITIVITY)    EKG None  Radiology MR Brain W and Wo Contrast Result Date: 10/20/2023 CLINICAL DATA:  Seizure, new-onset, no history of trauma  EXAM: MRI HEAD WITHOUT AND WITH CONTRAST TECHNIQUE: Multiplanar, multiecho pulse sequences of the brain and surrounding structures were obtained without and with intravenous contrast. CONTRAST:  8mL GADAVIST GADOBUTROL 1 MMOL/ML IV SOLN COMPARISON:  Same day CT head.  MRI head June 17, 2020. FINDINGS: Brain: No acute infarction, hemorrhage, hydrocephalus, extra-axial collection or mass lesion. Mild T2/FLAIR hyperintensities the white matter, nonspecific but compatible with chronic microvascular ischemic change. No pathologic enhancement. Vascular: Major arterial flow voids are maintained Skull and upper cervical spine: Normal marrow signal. Sinuses/Orbits: Negative. IMPRESSION: No evidence of acute intracranial abnormality. Electronically Signed   By: Stevenson Elbe M.D.   On: 10/20/2023 22:05   CT Head Wo Contrast Result Date: 10/20/2023 EXAM: CT HEAD WITHOUT CONTRAST 10/20/2023 07:03:52 PM TECHNIQUE: CT of the head was performed without the administration of intravenous contrast. Automated exposure control, iterative reconstruction, and/or weight based adjustment of the mA/kV was utilized to reduce the radiation dose to as low as reasonably achievable. COMPARISON: 01/02/2019 CLINICAL HISTORY: Seizure, new-onset, no history of trauma. Pt here for substernal CP that woke her out of sleep at 4pm today, it was gone 10 min later and has not came back. Denies SHOB. C/O cough and headache for a few days, pt on abx for it. Pt has not taken hugh BP meds today. Axox4. EMS gave 324mg  of aspirin and 4mg  of zofran . FINDINGS: BRAIN AND VENTRICLES: There is no acute intracranial hemorrhage, mass effect or midline shift. No abnormal extra-axial fluid collection. The gray-white differentiation is maintained without an acute infarct. There is no hydrocephalus. Mild subcortical and periventricular small vessel ischemic changes. ORBITS: The visualized portion of the orbits demonstrate no acute abnormality. SINUSES: The  visualized paranasal sinuses and mastoid air cells demonstrate no acute abnormality. SOFT TISSUES AND SKULL: No acute abnormality of the visualized skull or soft tissues. IMPRESSION: 1. No acute intracranial abnormality. 2. Mild small vessel ischemic changes. Electronically signed by: Zadie Herter MD 10/20/2023 07:09 PM EDT RP Workstation: [id number]   DG Chest Portable 1 View Result Date: 10/20/2023 CLINICAL DATA:  Chest pain EXAM: PORTABLE CHEST 1 VIEW COMPARISON:  Chest x-ray 08/07/2022. FINDINGS: Underinflation. There is some linear opacity along the lung bases likely atelectasis. No pneumothorax, effusion or edema. No consolidation. Normal cardiopericardial silhouette. Calcified, tortuous ectatic aorta. Overlapping cardiac leads. IMPRESSION: Underinflation with increasing basilar atelectasis. Electronically Signed   By: Adrianna Horde M.D.   On: 10/20/2023 18:57    Medications Ordered in ED Medications  LORazepam (ATIVAN) 2 MG/ML injection (  Not Given 10/20/23 1752)  acetaminophen  (TYLENOL ) tablet 1,000 mg (1,000 mg Oral  Given 10/20/23 2027)  levETIRAcetam (KEPPRA) undiluted injection 1,000 mg (1,000 mg Intravenous Given 10/20/23 2032)  gadobutrol (GADAVIST) 1 MMOL/ML injection 8 mL (8 mLs Intravenous Contrast Given 10/20/23 2144)    ED Course/ Medical Decision Making/ A&P  Mabeline Varas is a 63 y.o. female presents as detailed above  Differential ddx: Intracranial mass, lesion, abscess, electrolyte abnormalities, hypoglycemia, anemia, dehydration, syncope, myoclonic jerks, precordial catch, intoxication,  On arrival, patient afebrile hemodynamically stable no hypoxia or respiratory distress.  ED Work-up: Please see details of labs and imaging listed above.  History not concerning for ACS and troponin 4.  No EKG changes. CT head with small vessel ischemic changes.  No acute abnormality. Chest x-ray with mild basilar atelectasis. No leukocytosis or anemia.  No significant electrolyte  abnormalities.  Glucose within normal limits.  Ethanol negative.  Case discussed with neurology.  Per chart review patient is on Wellbutrin  which could lower seizure threshold.  Due to concern for focal seizure recommend MRI brain and Keppra load.  Recommend initiating Keppra and lamotrigine with plan to discontinue Keppra once at a therapeutic level as Keppra may worsen anxiety depression. MRI brain with and without contrast did not reveal any acute abnormality. As discussed with neurology will initiate Keppra and lamotrigine.  Patient states she is not on Wellbutrin  but will continue with previous plan as she has had mood disorders in the past.  Plan discussed with the patient and husband at bedside who voiced understanding and agree with plan.  Seizure precautions provided.  Overall impression chest wall pain, possible focal seizure   Patient stable for discharge and outpatient follow-up. Strict return precautions provided. Patient voices understanding and agrees with plan.   Patient seen with supervising physician who agrees with plan.  Final Clinical Impression(s) / ED Diagnoses Final diagnoses:  Precordial pain  Focal seizure (HCC)  Seizures (HCC)    Rx / DC Orders ED Discharge Orders          Ordered    levETIRAcetam (KEPPRA) 500 MG tablet  2 times daily        10/20/23 2212    lamoTRIgine (LAMICTAL) 25 MG tablet  Multiple Frequencies,   Status:  Discontinued        10/20/23 2212    lamoTRIgine (LAMICTAL) 25 MG tablet  Multiple Frequencies        10/20/23 2214    Ambulatory referral to Neurology       Comments: An appointment is requested in approximately: 2 weeks   10/20/23 2216            Angele Keller, DO PGY-3 Emergency Medicine    Angele Keller, DO 10/20/23 2238    Early Glisson, MD 10/21/23 1630

## 2023-10-20 NOTE — ED Notes (Signed)
 Patient transported to MRI

## 2023-10-20 NOTE — Discharge Instructions (Addendum)
 Please follow-up with neurology.  A referral was placed but also call the clinic via the information provided above.  Please do not drive until you follow-up with neurology. Please take Keppra 500 mg 2 times a day until neurology discontinues.  Please take lamotrigine as listed below    Morning  Night  Week 1 and 2   none   25 mg   Week 3 and 4   25 mg    25 mg  Week 5   25 mg   50 mg  Week 6   50 mg   75 mg  Week 7   75 mg 100 mg  Week 8  100 mg 125 mg  Please note Keppra may worsen anxiety and depression. You may continue Tylenol  ibuprofen  as needed for chest and back pain. Thank you for allowing us  to take care of you today.  We hope you begin feeling better soon.   To-Do:  Please follow-up with your primary doctor within the next 2-3 days. Please return to the Emergency Department or call 911 if you experience chest pain, shortness of breath, severe pain, severe fever, altered mental status, or have any reason to think that you need emergency medical care.  Thank you again.  Hope you feel better soon.  [facility] of Emergency Medicine

## 2023-10-20 NOTE — ED Triage Notes (Signed)
 Pt here for substernal CP that woke her out of sleep at 4pm today, it was gone 10 min later and has not came back. Denies SHOB. C/O cough and headache for a few days, pt on abx for it. Pt has not taken hugh BP meds today. Axox4. EMS gave 324mg  of aspirin and 4mg  of zofran 

## 2023-10-20 NOTE — ED Notes (Signed)
 Pt had 2 min seizure activy witnessed by RN and PA. PA at bedside

## 2023-10-20 NOTE — Progress Notes (Shared)
 Discussed with ED resident Angele Keller  In brief, patient presented with chest pain, subsequently had an episode below for which Dr. Pauletta Boroughs called me to discuss  Witnessed episode in triage highly concerning for focal seizure: Left facial twitching, rightward gaze, not responding 30 - 60 seconds, resolved before Ativan given, emesis afterwards and tired/sleepy but returning to baseline.  Per ED provider, no focal neurological deficits, headache now but not previously   Seizures in childhood, last seizure maybe at age 57 to 63 years old, limited history recollected by patient  On Augmentin  for sinusitis recently but no other med changes per ED provider  - MRI brain w/ and w/o contrast, if reassuring, outpatient follow-up - Keppra 1000 mg load, then 500 mg twice daily, with plan to taper as lamotrigine is increased - Counsel on Wellbutrin  lowering seizure threshold; long term may benefit from transitioning keppra to lamotrigine (appears she has been on this since approximately March 2024 for anxiety related to her husband's PTSD based on chart review) - Please prescribe lamotrigine for at least the first 6 weeks as follows, with further titration per outpatient neurologist   Morning  Night  Week 1 and 2   none   25 mg   Week 3 and 4   25 mg    25 mg  Week 5   25 mg   50 mg  Week 6   50 mg   75 mg  Week 7   75 mg 100 mg  Week 8  100 mg 125 mg  - Medical clearance per ED    Basic Metabolic Panel: Recent Labs  Lab 10/20/23 1818  NA 135  K 4.2  CL 103  CO2 20*  GLUCOSE 115*  BUN 8  CREATININE 0.80  CALCIUM  9.3  MG 2.4    CBC: Recent Labs  Lab 10/20/23 1818  WBC 9.0  NEUTROABS 5.0  HGB 14.4  HCT 43.7  MCV 87.8  PLT 229    Current vital signs: BP 131/84   Pulse 87   Temp 99.1 F (37.3 C) (Oral)   Resp 19   Ht 5' 3 (1.6 m)   Wt 83.5 kg   LMP 12/03/2012   SpO2 93%   BMI 32.59 kg/m  Vital signs in last 24 hours: Temp:  [99.1 F (37.3 C)] 99.1 F (37.3 C)  (06/16 1742) Pulse Rate:  [86-104] 87 (06/16 1845) Resp:  [16-19] 19 (06/16 1845) BP: (131-155)/(84-95) 131/84 (06/16 1845) SpO2:  [92 %-94 %] 93 % (06/16 1845) Weight:  [83.5 kg] 83.5 kg (06/16 1743)  Current Outpatient Medications  Medication Instructions   acetaminophen  (TYLENOL ) 500-1,000 mg, Oral, Every 6 hours PRN   amLODipine  (NORVASC ) 10 mg, Oral, Daily   azelastine  (ASTELIN ) 0.1 % nasal spray 2 sprays, Each Nare, 2 times daily, Use in each nostril as directed   buPROPion  (WELLBUTRIN  XL) 150 mg, Oral, Daily   clobetasol  cream (TEMOVATE ) 0.05 % 1 Application, Topical, 2 times daily   EPINEPHrine  (EPIPEN  2-PAK) 0.3 mg, Intramuscular,  Once, If you develop difficulty breathing   HYDROcodone -acetaminophen  (NORCO) 5-325 MG tablet 1 tablet, Oral, Every 6 hours PRN   methocarbamol  (ROBAXIN ) 500 mg, Oral, Every 8 hours PRN   ondansetron  (ZOFRAN -ODT) 4 mg, Oral, Every 8 hours PRN   oxyCODONE -acetaminophen  (PERCOCET/ROXICET) 5-325 MG tablet 1 tablet, Oral, Every 6 hours PRN   pantoprazole  (PROTONIX ) 40 mg, Oral, Daily   silver  sulfADIAZINE  (SILVADENE ) 1 % cream 1 Application, Topical, Daily   sucralfate  (CARAFATE ) 1  g, Oral, 3 times daily with meals & bedtime   tamsulosin  (FLOMAX ) 0.4 mg, Oral, Daily     These are curbside recommendations based upon the information readily available in the chart on brief review as well as history and examination information provided to me by requesting provider and do not replace a full detailed consult.  ED provider to notify patient of interprofessional consultation  12 minutes of care, majority and discussion with the ED team

## 2023-10-22 ENCOUNTER — Encounter: Payer: Self-pay | Admitting: Neurology

## 2023-10-22 ENCOUNTER — Ambulatory Visit (INDEPENDENT_AMBULATORY_CARE_PROVIDER_SITE_OTHER): Admitting: Neurology

## 2023-10-22 ENCOUNTER — Telehealth: Payer: Self-pay | Admitting: Neurology

## 2023-10-22 VITALS — BP 122/87 | HR 111 | Ht 63.0 in | Wt 185.5 lb

## 2023-10-22 DIAGNOSIS — G4733 Obstructive sleep apnea (adult) (pediatric): Secondary | ICD-10-CM | POA: Insufficient documentation

## 2023-10-22 DIAGNOSIS — R569 Unspecified convulsions: Secondary | ICD-10-CM | POA: Diagnosis not present

## 2023-10-22 DIAGNOSIS — R55 Syncope and collapse: Secondary | ICD-10-CM | POA: Insufficient documentation

## 2023-10-22 DIAGNOSIS — I10 Essential (primary) hypertension: Secondary | ICD-10-CM | POA: Insufficient documentation

## 2023-10-22 DIAGNOSIS — E0590 Thyrotoxicosis, unspecified without thyrotoxic crisis or storm: Secondary | ICD-10-CM | POA: Insufficient documentation

## 2023-10-22 NOTE — Telephone Encounter (Signed)
 Pt wants to know if she can fly

## 2023-10-22 NOTE — Telephone Encounter (Signed)
Yes, she can fly.
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
## 2023-10-22 NOTE — Patient Instructions (Signed)
 Please hold on starting antiseizure medication Will obtain routine EEG, if normal will obtain a 3-day ambulatory EEG If any abnormality on EEG will start antiseizure medication If any additional seizures, we will start antiseizure medication Continue follow-up PCP Return if worse or any other concerns

## 2023-10-22 NOTE — Telephone Encounter (Signed)
 Called and spoke to pt and stated that she can fly. Pt proceeded to ask if she can drive I stated not for 6 months since last sz.

## 2023-10-23 ENCOUNTER — Other Ambulatory Visit: Payer: Self-pay | Admitting: Adult Health

## 2023-10-23 DIAGNOSIS — I10 Essential (primary) hypertension: Secondary | ICD-10-CM

## 2023-10-23 MED ORDER — AMLODIPINE BESYLATE 10 MG PO TABS: 10.0000 mg | ORAL_TABLET | Freq: Every day | ORAL | 0 refills | Status: DC

## 2023-10-23 NOTE — Telephone Encounter (Signed)
 Copied from CRM [id number]. Topic: Clinical - Medication Refill >> Oct 23, 2023  2:44 PM Baldo Levan wrote: Medication:  amLODipine  amLODipine  (NORVASC ) 10 MG tablet   Has the patient contacted their pharmacy? Yes- Patient left it in another state (Agent: If no, request that the patient contact the pharmacy for the refill. If patient does not wish to contact the pharmacy document the reason why and proceed with request.) (Agent: If yes, when and what did the pharmacy advise?)  This is the patient's preferred pharmacy:  [facility] [id number] - [place], [place] - [address] [place] [address] Phone: [phone] Fax: [phone]   Is this the correct pharmacy for this prescription? Yes If no, delete pharmacy and type the correct one.   Has the prescription been filled recently? No  Is the patient out of the medication? Yes  Has the patient been seen for an appointment in the last year OR does the patient have an upcoming appointment? Yes  Can we respond through MyChart? Yes  Agent: Please be advised that Rx refills may take up to 3 business days. We ask that you follow-up with your pharmacy.

## 2023-10-24 ENCOUNTER — Inpatient Hospital Stay: Admitting: Adult Health

## 2023-10-29 ENCOUNTER — Ambulatory Visit (INDEPENDENT_AMBULATORY_CARE_PROVIDER_SITE_OTHER): Admitting: *Deleted

## 2023-10-29 DIAGNOSIS — R569 Unspecified convulsions: Secondary | ICD-10-CM

## 2023-10-29 NOTE — Procedures (Signed)
   History:  63 year old woman with seizure   EEG classification:  Awake and asleep  Duration: 26 minutes   Technical aspects: This EEG study was done with scalp electrodes positioned according to the 10-20 International system of electrode placement. Electrical activity was reviewed with band pass filter of 1-70Hz , sensitivity of 7 uV/mm, display speed of 49mm/sec with a 60Hz  notched filter applied as appropriate. EEG data were recorded continuously and digitally stored.   Description of the recording: The background rhythms of this recording consists of a fairly well modulated medium amplitude background activity of 10 Hz. As the record progresses, the patient initially is in the waking state, but appears to enter the early stage II sleep during the recording, with rudimentary sleep spindles and vertex sharp wave activity seen. During the wakeful state, photic stimulation was performed, and no abnormal responses were seen. Hyperventilation was also performed, no abnormal response seen. No epileptiform discharges seen during this recording. There was no focal slowing.   Abnormality: None   Impression: This is a normal awake and sleep EEG. No evidence of interictal epileptiform discharges. Normal EEGs, however, do not rule out epilepsy.    Syaire Saber, MD [facility]

## 2023-10-30 ENCOUNTER — Ambulatory Visit: Payer: Self-pay | Admitting: Neurology

## 2023-10-30 ENCOUNTER — Telehealth: Payer: Self-pay

## 2023-10-30 DIAGNOSIS — R569 Unspecified convulsions: Secondary | ICD-10-CM

## 2023-10-30 NOTE — Telephone Encounter (Signed)
 Pt called to request callback from MD to explain why she has to take a 3 day ambulatory EEG . Pt is concern and would like  a callback

## 2023-10-30 NOTE — Telephone Encounter (Signed)
 SABRA

## 2023-10-31 ENCOUNTER — Other Ambulatory Visit: Admitting: *Deleted

## 2023-10-31 ENCOUNTER — Telehealth: Payer: Self-pay | Admitting: Neurology

## 2023-10-31 NOTE — Telephone Encounter (Signed)
 Dr. Gregg spoke with pt today. See phone note from 10/31/23

## 2023-10-31 NOTE — Telephone Encounter (Signed)
 Spoke with patient, informed that her based on [place] law, she should not drive for the next 6 months. Her routine EEG was normal, plan is to get an ambulatory EEG.   Dr. Joniece Smotherman

## 2023-10-31 NOTE — Telephone Encounter (Signed)
 Pt returned call, Please call back when available.

## 2023-10-31 NOTE — Telephone Encounter (Signed)
 Pt called in regards to  wanting to know if she can drive home the patient has to drive home to [place] and need clarence from MD in order to go home. Pt is very frustrated and need this answer as soon as possible .

## 2023-11-03 NOTE — Telephone Encounter (Addendum)
 Pt states her reason for calling this morning is to see when it is that she has been scheduled.  Pt said she was told by RN and Dr Gregg that she would be seen this week. Pt is asking for a call from RN

## 2023-11-10 ENCOUNTER — Telehealth: Payer: Self-pay | Admitting: Neurology

## 2023-11-10 NOTE — Telephone Encounter (Signed)
 My chart message sent to patient and order resent to [facility].

## 2023-11-10 NOTE — Telephone Encounter (Signed)
 Patient would like a call back with the Ambulatory EEG Information. Patient said would like the phone number to be able to call the company

## 2023-11-14 NOTE — Telephone Encounter (Signed)
 Return as needed. I did not scheduled her for a follow up visit.

## 2023-11-17 DIAGNOSIS — G40909 Epilepsy, unspecified, not intractable, without status epilepticus: Secondary | ICD-10-CM | POA: Diagnosis not present

## 2023-11-17 NOTE — Telephone Encounter (Signed)
 Called pt and informed. She states that she was told that it was like a seizure but there was no conclusive answer so she does not understand why she can't drive. Pt stated that she would like the provider or nurse to call her back next time. Please advise.

## 2023-11-17 NOTE — Telephone Encounter (Signed)
 Please call and inform patient that I will complete the EEG as soon as possible but even if the EEG came back normal, she is not allowed to drive until April 19 2024

## 2023-11-17 NOTE — Telephone Encounter (Signed)
 Call to patient, I reviewed history and last visit notes as well as ED visit note where patient was note to have seizure like activity. Advised that even if ambulatory eeg result come back negative, [place] law states a person must be seizure free for 6 months prior to driving again. Patient verbalized understanding

## 2023-11-17 NOTE — Telephone Encounter (Signed)
 Called and informed pt of this information. Pt asked if the Ambulatory EEG came back normal will this clear her to drive. I informed pt that this was a question she can ask the nurse once she is called with the results of the test.

## 2023-11-24 NOTE — Telephone Encounter (Signed)
 Pt returned call. Please call back when available.

## 2023-11-24 NOTE — Telephone Encounter (Signed)
 Pt is calling to see if she can ger results from EEG .

## 2023-11-25 ENCOUNTER — Encounter (INDEPENDENT_AMBULATORY_CARE_PROVIDER_SITE_OTHER): Admitting: Neurology

## 2023-11-25 ENCOUNTER — Ambulatory Visit: Payer: Self-pay | Admitting: Neurology

## 2023-11-25 DIAGNOSIS — R569 Unspecified convulsions: Secondary | ICD-10-CM

## 2023-11-25 NOTE — Procedures (Signed)
 Clinical History:  This is a 63 y/o F who presents with history of hypertension, hyperlipidemia, who is presenting after seizure like activity described as left hand jerking and rightward eye deviation   INTERMITTENT MONITORING with VIDEO TECHNICAL SUMMARY:  This AVEEG was performed using equipment provided by [employer] utilizing Bluetooth ( Trackit ) amplifiers with continuous EEGT attended video collection using encrypted remote transmission via [employer] secured cellular tower network with data rates for each AVEEG performed. This is a Therapist, music AVEEG, obtained, according to the 10-20 international electrode placement system, reformatted digitally into referential and bipolar montages. Data was acquired with a minimum of 21 bipolar connections and sampled at a minimum rate of 250 cycles per second per channel, maximum rate of 450 cycles per second per channel and two channels for EKG. The entire VEEG study was recorded through cable and or radio telemetry for subsequent analysis. Specified epochs of the AVEEG data were identified at the direction of the subject by the depression of a push button by the patient. Each patients event file included data acquired two minutes prior to the push button activation and continuing until two minutes afterwards. AVEEG files were reviewed on [employer] server, Licensed Software provided by [employer] with a digital high frequency filter set at 70 Hz and a low frequency filter set at 1 Hz with a paper speed of 59mm/s resulting in 10 seconds per digital page. This entire AVEEG was reviewed by the EEG Technologist. Random time samples, random sleep samples, clips, patient initiated push button files with included patient daily diary logs, EEG Technologist pruned data was reviewed and verified for accuracy and validity by the governing reading neurologist in full details. This AEEGV was fully compliant with all requirements for CPT 97500 for  setup, patient education, take down and administered by an EEG technologist.   Long-Term EEG with Video was monitored intermittently by a qualified EEG technologist for the entirety of the recording; quality check-ins were performed at a minimum of every two hours, checking and documenting real-time data and video to assure the integrity and quality of the recording (e.g., camera position, electrode integrity and impedance), and identify the need for maintenance. For intermittent monitoring, an EEG Technologist monitored no more than 12 patients concurrently. Diagnostic video was captured at least 80% of the time during the recording.   PATIENT EVENTS:  A button press or notation was made 1 time. Patient log was reviewed with the patient at disconnect with the intent to reconcile events. See reconciled patient log in tech uploads.    PATIENT EVENT - #1 NO BUTTON PRESS. DIARY NOTE: BROKE OUT IN HIVES, RED MARKS, ITCHY. EEG SHOWS AWAKE BACKGROUND WITHOUT ICTAL CHANGES. PATIENT IS ON CAMERA.   TECHNOLOGIST EVENTS:  No clear epileptiform activity was detected by the reviewing neurodiagnostic technologist during the recording for further evaluation.   TIME SAMPLES:  10-minutes of every two hours recorded are reviewed as random time samples.   SLEEP SAMPLES:  5-minutes of every 24 hour recorded sleep cycle are reviewed as random sleep samples  AWAKE:  At maximal level of alertness, the posterior dominant background activity was continuous, reactive, low voltage rhythm of 10.5 Hz. This was symmetric, well-modulated, and attenuated with eye opening. Diffuse, symmetric, frontocentral beta range activity was present.   SLEEP:   N1 Sleep (Stage 1) was observed and characterized by the disappearance of alpha rhythm and the appearance of vertex activity.   N2 Sleep (Stage 2) was observed  and characterized by vertex waves, K-complexes, and sleep spindles.   N3 (Stage 3) sleep was observed and  characterized by high amplitude Delta activity of 20%.   REM sleep was observed.   EKG:  There were no arrhythmias or abnormalities noted during this recording.   Impression:  Normal EEG: awake and asleep   Clinical Correlation:  This is a normal 3-day ambulatory EEG tracing. No focal abnormalities or epileptiform discharges were seen. There were no electrographic seizures noted. There was one event described as above with no changes in EEG background, hence likely nonepileptic. Please note a normal EEG does not exclude the diagnosis of epilepsy   Pastor Falling, MD [facility]

## 2023-11-25 NOTE — Telephone Encounter (Signed)
 Pt is referring to At home EEG test/Pt is waiting for callback

## 2024-01-17 ENCOUNTER — Other Ambulatory Visit: Payer: Self-pay | Admitting: Adult Health

## 2024-01-17 DIAGNOSIS — I10 Essential (primary) hypertension: Secondary | ICD-10-CM

## 2024-02-06 ENCOUNTER — Other Ambulatory Visit (INDEPENDENT_AMBULATORY_CARE_PROVIDER_SITE_OTHER): Payer: Self-pay | Admitting: Family Medicine

## 2024-03-08 ENCOUNTER — Ambulatory Visit (INDEPENDENT_AMBULATORY_CARE_PROVIDER_SITE_OTHER): Admitting: Family

## 2024-03-08 ENCOUNTER — Encounter (INDEPENDENT_AMBULATORY_CARE_PROVIDER_SITE_OTHER): Payer: Self-pay

## 2024-03-08 VITALS — BP 126/82 | HR 77 | Temp 97.6°F | Resp 20 | Ht 63.0 in | Wt 185.0 lb

## 2024-03-08 DIAGNOSIS — S0501XA Injury of conjunctiva and corneal abrasion without foreign body, right eye, initial encounter: Secondary | ICD-10-CM

## 2024-03-08 MED ORDER — ERYTHROMYCIN 5 MG/GM OP OINT
TOPICAL_OINTMENT | Freq: Four times a day (QID) | OPHTHALMIC | 0 refills | Status: AC
Start: 2024-03-08 — End: 2024-03-13

## 2024-03-08 NOTE — Progress Notes (Signed)
 [facility]  OFFICE NOTE         Subjective   Historian: Patient      Chief Complaint   Patient presents with    Eye Problem     Rt eye swelling, pain. Denies itching x 2 days. No eye drops used.     63 year old female presents to clinic with right eye irritation and erythema that started 2 days ago.  Patient states she is unsure if something got into her eye because she is currently drilling and moving things on her wall.  Reports some blurry vision in that right eye.        History:  Medications and Allergies reviewed.   Pertinent Past Medical, Surgical, Family and Social History were reviewed.          Objective     Vitals:    03/08/24 1157   BP: 126/82   BP Site: Right arm   Patient Position: Sitting   Cuff Size: X-Large   Pulse: 77   Resp: 20   Temp: 97.6 F (36.4 C)   TempSrc: Tympanic   SpO2: 97%   Weight: 83.9 kg (185 lb)   Height: 1.6 m (5' 3)      Body mass index is 32.77 kg/m.          Physical Exam  HENT:      Head: Normocephalic.     Eyes: Left eye exhibits no chemosis, no discharge and no hordeolum. No foreign body present in the left eye. Right conjunctiva is injected. Right conjunctiva has no hemorrhage.   Slit lamp exam:       The right eye shows corneal abrasion. Cardiovascular:      Rate and Rhythm: Normal rate and regular rhythm.   Pulmonary:      Effort: Pulmonary effort is normal.   Musculoskeletal:         General: Normal range of motion.   Neurological:      General: No focal deficit present.      Mental Status: She is alert and oriented to person, place, and time.   Skin:     General: Skin is warm.      Capillary Refill: Capillary refill takes less than 2 seconds.   Psychiatric:         Mood and Affect: Mood normal.   Vitals reviewed.     Urgent Care Course   There were no labs reviewed with this patient during the visit.        Procedures   Procedures     Assessment / Plan     Differential Diagnoses including but not limited to: Bacterial conjunctivitis, viral  conjunctivitis, corneal abrasion    Treating patient for suspected corneal abrasion with erythromycin ointment.  Advised patient to follow-up with an eye specialist within a week.    Ghadeer was seen today for eye problem.    Diagnoses and all orders for this visit:    Cornea abrasion, right, initial encounter    Other orders  -     erythromycin (ROMYCIN) ophthalmic ointment; Place into the right eye every 6 (six) hours for 5 days         The indications for early follow-up with PCP and return to UC were discussed. Patient/family received education on the working diagnosis, diagnostic uncertainties, and proposed treatment plan. Indications for emergency evaluation in the ED were reviewed. Written and verbal discharge instructions were provided and discussed and all questions from the patient/family were  addressed, with no apparent barriers.

## 2024-03-08 NOTE — Patient Instructions (Signed)
 Take the eye ointment medications as prescribed     Follow up here or PCP for new or worsening symptoms     Please see an eye specialist within the week

## 2024-03-21 ENCOUNTER — Ambulatory Visit (INDEPENDENT_AMBULATORY_CARE_PROVIDER_SITE_OTHER): Admitting: Family

## 2024-03-21 VITALS — BP 142/89 | HR 80 | Temp 97.4°F | Resp 16 | Ht 63.0 in | Wt 187.2 lb

## 2024-03-21 DIAGNOSIS — S4991XA Unspecified injury of right shoulder and upper arm, initial encounter: Secondary | ICD-10-CM | POA: Insufficient documentation

## 2024-03-21 DIAGNOSIS — N644 Mastodynia: Secondary | ICD-10-CM

## 2024-03-21 DIAGNOSIS — R1030 Lower abdominal pain, unspecified: Secondary | ICD-10-CM | POA: Insufficient documentation

## 2024-03-21 DIAGNOSIS — M25511 Pain in right shoulder: Secondary | ICD-10-CM

## 2024-03-21 NOTE — Patient Instructions (Signed)
 Ref to Emergency room

## 2024-03-21 NOTE — Progress Notes (Signed)
 [facility]  OFFICE NOTE         Subjective   Historian: Patient      Chief Complaint   Patient presents with    Fall     Pt C/O a fall and landed on the right side of her body. States RT  shoulder pain, lower abd pain, RT breast discomfort, onset x 7 days.  Pt took Tylenol for pain         History of Present Illness  Jody [name] is a 63 year old female who presents with right shoulder pain and abdominal pain following a fall.    She fell in her garage seven days ago, impacting her right side on a cement floor. She experiences significant right shoulder pain, described as excruciating and limiting her range of motion. She cannot lift her arm without assistance. There is no numbness, tingling, or weakness in the arm.    She has continuous abdominal pain, described as feeling like she 'got punched', rated as seven out of ten. She experiences nausea but no vomiting. There is no bruising in the abdominal area, and bowel movements are regular.    She has a persistent headache since the fall, though she did not hit her head directly.    Right breast pain and tenderness , no bruising or swelling     History:  Medications and Allergies reviewed.   Pertinent Past Medical, Surgical, Family and Social History were reviewed.        Objective     Vitals:    03/21/24 1142   BP: 142/89   BP Site: Left arm   Patient Position: Sitting   Cuff Size: Medium   Pulse: 80   Resp: 16   Temp: 97.4 F (36.3 C)   TempSrc: Tympanic   SpO2: 96%   Weight: 84.9 kg (187 lb 3.2 oz)   Height: 1.6 m (5' 3)      Body mass index is 33.16 kg/m.          Physical Exam  Constitutional:       Appearance: Normal appearance.   Cardiovascular:      Rate and Rhythm: Normal rate and regular rhythm.   Pulmonary:      Effort: Pulmonary effort is normal.      Breath sounds: Normal breath sounds.   Abdominal:      General: Bowel sounds are normal.      Tenderness: There is abdominal tenderness in the right lower quadrant. There is no right  CVA tenderness or left CVA tenderness.        Comments: No bruising or discoloration    Neurological:      Mental Status: She is alert.   Chest:          Comments: Right lower breast tissue tenderness . No swelling or discharge   Exam conducted with a chaperone present (shane MA).      Physical Exam    Urgent Care Course   There were no labs reviewed with this patient during the visit.    There were no x-rays reviewed with this patient during the visit.      Procedures   Procedures     Assessment / Plan         Assessment & Plan  Right shoulder and upper arm injury after fall    Acute injury with significant pain and limited range of motion. No neurological deficits.       Right breast pain  after fall    Pain present without visible bruising.    Headache after fall    Persistent headache with no head trauma reported.    Multiple injury after a fall     Abdomen tender   Abdominal pain after fall    Persistent right-sided abdominal pain, potential internal injury considered. Referred to emergency room for CT scan or ultrasound of the abdomen.  [place]     Recording duration: 7 minutes       Vala was seen today for fall.    Diagnoses and all orders for this visit:    Lower abdominal pain  -     Referal to Emergency Department (AKA ED) (EXTERNAL); Future    Injury of right shoulder, initial encounter  -     Cancel: XR Shoulder Right 2+ Views; Future    Breast pain, right  -     Referal to Emergency Department (AKA ED) (EXTERNAL); Future         The indications for early follow-up with PCP and return to UC were discussed. Patient/family received education on the working diagnosis, diagnostic uncertainties, and proposed treatment plan. Indications for emergency evaluation in the ED were reviewed. Written and verbal discharge instructions were provided and discussed and all questions from the patient/family were addressed, with no apparent barriers.      Verbal consent obtained to record this visit.

## 2024-06-02 ENCOUNTER — Ambulatory Visit: Admitting: Adult Health

## 2024-06-02 ENCOUNTER — Encounter: Payer: Self-pay | Admitting: Adult Health

## 2024-06-02 VITALS — BP 130/80 | HR 84 | Temp 98.0°F | Ht 63.0 in | Wt 187.0 lb

## 2024-06-02 DIAGNOSIS — G4733 Obstructive sleep apnea (adult) (pediatric): Secondary | ICD-10-CM

## 2024-06-02 DIAGNOSIS — I10 Essential (primary) hypertension: Secondary | ICD-10-CM | POA: Diagnosis not present

## 2024-06-02 DIAGNOSIS — Z1211 Encounter for screening for malignant neoplasm of colon: Secondary | ICD-10-CM

## 2024-06-02 DIAGNOSIS — E782 Mixed hyperlipidemia: Secondary | ICD-10-CM

## 2024-06-02 MED ORDER — AMLODIPINE BESYLATE 10 MG PO TABS: 10.0000 mg | ORAL_TABLET | Freq: Every day | ORAL | 0 refills | Status: AC

## 2024-06-02 NOTE — Progress Notes (Signed)
 "  Subjective:    Patient ID: April Gonzalez, female    DOB: 1960/08/15, 64 y.o.   MRN: [id number]  HPI  64 year old female who  has a past medical history of Allergy, Anemia (05/2012), Fibroids, GERD (gastroesophageal reflux disease), H/O uterine prolapse, History of blood transfusion, Hyperlipidemia, Hypertension, Rectal bleeding, Seizures (HCC), and Thyroid  disorder.  She is a previous patient of mine; about two years ago she moved up to [place]  but recently moved back to [place] about three weeks ago  Her chronic health history includes:   OSA -slightly had a home sleep study done on May 26, 2021 which showed moderate obstructive sleep apnea with AHI 22.5 an hour and SPO2 low 84.  It was recommended that she begin CPAP at bedtime. She never got her CPAP but does want to get it so she can sleep better.   Hypertension -she has been prescribed Norvasc  10 mg daily in the past, stopped taking this a few months ago her blood pressure has remained relatively stable. BP Readings from Last 3 Encounters:  06/02/24 130/80  10/22/23 122/87  10/20/23 137/87   Hyperlipidemia - not currently on statin therapy. Refused in the past  Lab Results  Component Value Date   CHOL 266 (H) 12/06/2021   HDL 46.50 12/06/2021   LDLCALC 188 (H) 12/06/2021   LDLDIRECT 139.0 03/15/2015   TRIG 161.0 (H) 12/06/2021   CHOLHDL 6 12/06/2021    Review of Systems  Constitutional: Negative.   HENT: Negative.    Eyes: Negative.   Respiratory: Negative.    Cardiovascular: Negative.   Gastrointestinal: Negative.   Endocrine: Negative.   Genitourinary: Negative.   Musculoskeletal: Negative.   Skin: Negative.   Allergic/Immunologic: Negative.   Neurological: Negative.   Hematological: Negative.   Psychiatric/Behavioral: Negative.     Past Medical History:  Diagnosis Date   Allergy    Anemia 05/2012   transfusion   Fibroids    H/O myomectomy   GERD (gastroesophageal reflux disease)    rare     H/O uterine prolapse    History of blood transfusion    x 3    Hyperlipidemia    Hypertension    Rectal bleeding    NOTED AT pv 01-09-17- PT STATES with most BM's for the last 2 years, also hurts    Seizures (HCC)    last seizure age 31 or 10 per pt report-01-09-17 no change in this   Thyroid  disorder     Social History   Socioeconomic History   Marital status: Married    Spouse name: Not on file   Number of children: 2   Years of education: 15   Highest education level: Not on file  Occupational History   Not on file  Tobacco Use   Smoking status: Never   Smokeless tobacco: Never  Substance and Sexual Activity   Alcohol use: Yes   Drug use: No   Sexual activity: Yes    Birth control/protection: Surgical    Comment: BTL   Other Topics Concern   Not on file  Social History Narrative   She works for [employer]    Married   Has 2 children    Social Drivers of Health   Tobacco Use: Low Risk (06/02/2024)   Patient History    Smoking Tobacco Use: Never    Smokeless Tobacco Use: Never    Passive Exposure: Not on file  Financial Resource Strain: Not on file  Food Insecurity: Not  on file  Transportation Needs: Not on file  Physical Activity: Not on file  Stress: Not on file  Social Connections: Not on file  Intimate Partner Violence: Not on file  Depression (PHQ2-9): Low Risk (06/02/2024)   Depression (PHQ2-9)    PHQ-2 Score: 0  Alcohol Screen: Not on file  Housing: Low Risk (10/18/2023)   Received from [facility]   Epic    What is your living situation today?: I have a steady place to live    Think about the place you live. Do you have problems with any of the following? Choose all that apply:: None/None on this list  Utilities: Not on file  Health Literacy: Not on file    Past Surgical History:  Procedure Laterality Date   ANKLE SURGERY     One plate, seven screws in the right ankle    BLADDER SUSPENSION N/A 12/10/2012   Procedure: TRANSVAGINAL TAPE (TVT)  PROCEDURE;  Surgeon: Jon CINDERELLA Rummer, MD;  Location: [facility];  Service: Gynecology;  Laterality: N/A;   CYSTOCELE REPAIR N/A 12/10/2012   Procedure: ANTERIOR REPAIR (CYSTOCELE);  Surgeon: Shanda SHAUNNA Muscat, MD;  Location: [facility];  Service: Gynecology;  Laterality: N/A;   CYSTOSCOPY N/A 12/10/2012   Procedure: CYSTOSCOPY;  Surgeon: Jon CINDERELLA Rummer, MD;  Location: [facility];  Service: Gynecology;  Laterality: N/A;   EXCISION VAGINAL CYST     MYOMECTOMY  32YRS AGO   HYSTEROSCOPIC   TUBAL LIGATION  10/10/1991   VAGINAL HYSTERECTOMY N/A 12/10/2012   Procedure: Total Vaginal Hysterectomy;  Surgeon: Shanda SHAUNNA Muscat, MD;  Location: [facility];  Service: Gynecology;  Laterality: N/A;    Family History  Problem Relation Age of Onset   Heart failure Father    Diabetes Mother    High blood pressure Mother    Arthritis Mother    Breast cancer Maternal Aunt        in her 3's    Breast cancer Cousin    Thyroid  disease Sister    Colon cancer Neg Hx    Colon polyps Neg Hx    Esophageal cancer Neg Hx    Rectal cancer Neg Hx    Stomach cancer Neg Hx     Allergies[1]  Medications Ordered Prior to Encounter[2]  BP 130/80   Pulse 84   Temp 98 F (36.7 C) (Oral)   Ht 5' 3 (1.6 m)   Wt 187 lb (84.8 kg)   LMP 12/03/2012   SpO2 96%   BMI 33.13 kg/m       Objective:   Physical Exam Vitals and nursing note reviewed.  Constitutional:      Appearance: Normal appearance. She is obese.  Cardiovascular:     Rate and Rhythm: Normal rate and regular rhythm.     Pulses: Normal pulses.     Heart sounds: Normal heart sounds.  Pulmonary:     Effort: Pulmonary effort is normal.     Breath sounds: Normal breath sounds.  Skin:    General: Skin is warm and dry.  Neurological:     General: No focal deficit present.     Mental Status: She is alert and oriented to person, place, and time.  Psychiatric:        Mood and Affect: Mood normal.        Behavior: Behavior normal.        Thought Content: Thought  content normal.        Judgment: Judgment normal.  Assessment & Plan:  1. Essential hypertension - Will refill her Norvasc  today  - Follow up in for CPE in the next month or two  - amLODipine  (NORVASC ) 10 MG tablet; Take 1 tablet (10 mg total) by mouth daily.  Dispense: 90 tablet; Refill: 0  2. OSA (obstructive sleep apnea) - Phone number given for Pulmonary   3. Mixed hyperlipidemia - recheck lab at CPE   4. Colon cancer screening (Primary)  - Ambulatory referral to Gastroenterology  Darleene Shape, NP     [1]  Allergies Allergen Reactions   Methimazole Anaphylaxis    EMS had to be called and a 7-day's stay in the hospital resulted   Nitroglycerin Other (See Comments)    Sweating    Other Itching and Other (See Comments)    Seasonal allergies & dog hair = runny nose also   Tetracycline Hcl Hypertension   Acetaminophen  Other (See Comments)   Dog Epithelium (Canis Lupus Familiaris) Other (See Comments)   Lisinopril  Cough   Promethazine  Nausea Only    Made the patient feel like she was going to vomit and it spaced her out   Tetracycline Hcl Other (See Comments)   Latex Rash   Penicillins Other (See Comments)    Has patient had a PCN reaction causing immediate rash, facial/tongue/throat swelling, SOB or lightheadedness with hypotension: Unk Has patient had a PCN reaction causing severe rash involving mucus membranes or skin necrosis: Unk Has patient had a PCN reaction that required hospitalization: Unk Has patient had a PCN reaction occurring within the last 10 years: Yes If all of the above answers are NO, then may proceed with Cephalosporin use.    Shellfish Allergy Itching   Tape Rash and Dermatitis   Terbutaline Other (See Comments)    Reaction not recalled (??)  [2]  Current Outpatient Medications on File Prior to Visit  Medication Sig Dispense Refill   acetaminophen  (TYLENOL ) 500 MG tablet Take 500-1,000 mg by mouth every 6 (six) hours as needed  for mild pain or headache.     amLODipine  (NORVASC ) 10 MG tablet Take 1 tablet (10 mg total) by mouth daily. 90 tablet 0   carboxymethylcellulose (REFRESH PLUS) 0.5 % SOLN      ZYRTEC  ALLERGY 10 MG tablet Take 10 mg by mouth.     No current facility-administered medications on file prior to visit.   "

## 2024-06-02 NOTE — Patient Instructions (Addendum)
 I am going to refer you for a colonoscopy - someone will reach out to you to schedule your visit  Please call Pulmonary and reestablish with them for the CPAP [place]  [facility] at [facility]  [phone]  Call your GYN and schedule an appointment  [facility]  Phone: [phone]

## 2024-07-21 ENCOUNTER — Encounter: Admitting: Adult Health

## 2024-07-22 ENCOUNTER — Encounter: Admitting: Adult Health
